# Patient Record
Sex: Female | Born: 1950 | Race: White | Hispanic: No | Smoking: Former smoker
Health system: Southern US, Community
[De-identification: ages and names within clinical notes are randomized; demographics above are authoritative.]

## PROBLEM LIST (undated history)

## (undated) DIAGNOSIS — I10 Essential (primary) hypertension: Secondary | ICD-10-CM

## (undated) DIAGNOSIS — F17200 Nicotine dependence, unspecified, uncomplicated: Secondary | ICD-10-CM

## (undated) DIAGNOSIS — Z86010 Personal history of colonic polyps: Secondary | ICD-10-CM

## (undated) DIAGNOSIS — K922 Gastrointestinal hemorrhage, unspecified: Secondary | ICD-10-CM

## (undated) DIAGNOSIS — K219 Gastro-esophageal reflux disease without esophagitis: Secondary | ICD-10-CM

## (undated) DIAGNOSIS — I252 Old myocardial infarction: Secondary | ICD-10-CM

## (undated) DIAGNOSIS — Z5189 Encounter for other specified aftercare: Secondary | ICD-10-CM

## (undated) DIAGNOSIS — R200 Anesthesia of skin: Secondary | ICD-10-CM

## (undated) DIAGNOSIS — R932 Abnormal findings on diagnostic imaging of liver and biliary tract: Secondary | ICD-10-CM

## (undated) DIAGNOSIS — I509 Heart failure, unspecified: Secondary | ICD-10-CM

## (undated) DIAGNOSIS — D649 Anemia, unspecified: Secondary | ICD-10-CM

## (undated) DIAGNOSIS — I2510 Atherosclerotic heart disease of native coronary artery without angina pectoris: Secondary | ICD-10-CM

## (undated) DIAGNOSIS — I714 Abdominal aortic aneurysm, without rupture, unspecified: Secondary | ICD-10-CM

## (undated) DIAGNOSIS — E785 Hyperlipidemia, unspecified: Secondary | ICD-10-CM

## (undated) DIAGNOSIS — R202 Paresthesia of skin: Secondary | ICD-10-CM

## (undated) DIAGNOSIS — I219 Acute myocardial infarction, unspecified: Secondary | ICD-10-CM

## (undated) DIAGNOSIS — I2589 Other forms of chronic ischemic heart disease: Secondary | ICD-10-CM

## (undated) DIAGNOSIS — M1990 Unspecified osteoarthritis, unspecified site: Secondary | ICD-10-CM

## (undated) DIAGNOSIS — R930 Abnormal findings on diagnostic imaging of skull and head, not elsewhere classified: Secondary | ICD-10-CM

## (undated) DIAGNOSIS — J449 Chronic obstructive pulmonary disease, unspecified: Secondary | ICD-10-CM

## (undated) DIAGNOSIS — C50919 Malignant neoplasm of unspecified site of unspecified female breast: Secondary | ICD-10-CM

## (undated) HISTORY — DX: Atherosclerotic heart disease of native coronary artery without angina pectoris: I25.10

## (undated) HISTORY — DX: Abnormal findings on diagnostic imaging of skull and head, not elsewhere classified: R93.0

## (undated) HISTORY — PX: COLONOSCOPY: SHX174

## (undated) HISTORY — DX: Essential (primary) hypertension: I10

## (undated) HISTORY — DX: Gastrointestinal hemorrhage, unspecified: K92.2

## (undated) HISTORY — PX: OTHER SURGICAL HISTORY: SHX169

## (undated) HISTORY — DX: Abdominal aortic aneurysm, without rupture, unspecified: I71.40

## (undated) HISTORY — DX: Abdominal aortic aneurysm, without rupture: I71.4

## (undated) HISTORY — DX: Paresthesia of skin: R20.2

## (undated) HISTORY — DX: Hyperlipidemia, unspecified: E78.5

## (undated) HISTORY — DX: Acute myocardial infarction, unspecified: I21.9

## (undated) HISTORY — DX: Paresthesia of skin: R20.0

## (undated) HISTORY — DX: Unspecified osteoarthritis, unspecified site: M19.90

## (undated) HISTORY — DX: Anemia, unspecified: D64.9

## (undated) HISTORY — DX: Other forms of chronic ischemic heart disease: I25.89

## (undated) HISTORY — DX: Encounter for other specified aftercare: Z51.89

## (undated) HISTORY — PX: POLYPECTOMY: SHX149

## (undated) HISTORY — DX: Old myocardial infarction: I25.2

## (undated) HISTORY — DX: Abnormal findings on diagnostic imaging of liver and biliary tract: R93.2

## (undated) HISTORY — DX: Personal history of colonic polyps: Z86.010

## (undated) HISTORY — DX: Heart failure, unspecified: I50.9

## (undated) HISTORY — DX: Nicotine dependence, unspecified, uncomplicated: F17.200

## (undated) HISTORY — DX: Gastro-esophageal reflux disease without esophagitis: K21.9

---

## 2006-06-29 ENCOUNTER — Inpatient Hospital Stay (HOSPITAL_COMMUNITY): Admission: EM | Admit: 2006-06-29 | Discharge: 2006-07-02 | Payer: Self-pay | Admitting: Emergency Medicine

## 2006-06-29 ENCOUNTER — Ambulatory Visit: Payer: Self-pay | Admitting: Internal Medicine

## 2006-07-01 ENCOUNTER — Encounter: Payer: Self-pay | Admitting: Cardiology

## 2006-07-09 ENCOUNTER — Ambulatory Visit: Payer: Self-pay | Admitting: Cardiology

## 2006-07-09 LAB — CONVERTED CEMR LAB
BUN: 10 mg/dL (ref 6–23)
Calcium: 9.3 mg/dL (ref 8.4–10.5)
Glucose, Bld: 91 mg/dL (ref 70–99)
Potassium: 4.7 meq/L (ref 3.5–5.1)
Sodium: 134 meq/L — ABNORMAL LOW (ref 135–145)

## 2006-07-14 ENCOUNTER — Ambulatory Visit: Payer: Self-pay | Admitting: Cardiology

## 2006-07-14 LAB — CONVERTED CEMR LAB
BUN: 12 mg/dL (ref 6–23)
CO2: 28 meq/L (ref 19–32)
Calcium: 9.3 mg/dL (ref 8.4–10.5)
GFR calc Af Amer: 112 mL/min
GFR calc non Af Amer: 93 mL/min
Glucose, Bld: 96 mg/dL (ref 70–99)

## 2006-07-28 ENCOUNTER — Ambulatory Visit: Payer: Self-pay

## 2006-07-28 LAB — CONVERTED CEMR LAB
BUN: 9 mg/dL (ref 6–23)
Calcium: 9.1 mg/dL (ref 8.4–10.5)
Chloride: 100 meq/L (ref 96–112)
Creatinine, Ser: 0.7 mg/dL (ref 0.4–1.2)
GFR calc Af Amer: 112 mL/min
GFR calc non Af Amer: 93 mL/min
Sodium: 136 meq/L (ref 135–145)

## 2006-08-11 ENCOUNTER — Encounter (HOSPITAL_COMMUNITY): Admission: RE | Admit: 2006-08-11 | Discharge: 2006-08-11 | Payer: Self-pay | Admitting: Pediatrics

## 2006-08-11 ENCOUNTER — Ambulatory Visit: Payer: Self-pay | Admitting: Cardiology

## 2006-09-05 ENCOUNTER — Ambulatory Visit: Payer: Self-pay | Admitting: Cardiology

## 2006-09-05 LAB — CONVERTED CEMR LAB
ALT: 18 units/L (ref 0–40)
AST: 15 units/L (ref 0–37)
Bilirubin, Direct: 0.1 mg/dL (ref 0.0–0.3)
Total Protein: 6.4 g/dL (ref 6.0–8.3)
Triglycerides: 60 mg/dL (ref 0–149)
VLDL: 12 mg/dL (ref 0–40)

## 2006-09-09 ENCOUNTER — Ambulatory Visit (HOSPITAL_COMMUNITY): Admission: RE | Admit: 2006-09-09 | Discharge: 2006-09-09 | Payer: Self-pay | Admitting: Cardiology

## 2006-09-09 ENCOUNTER — Ambulatory Visit: Payer: Self-pay | Admitting: Cardiology

## 2006-09-09 ENCOUNTER — Encounter: Payer: Self-pay | Admitting: Cardiology

## 2006-09-10 ENCOUNTER — Ambulatory Visit: Payer: Self-pay | Admitting: Cardiology

## 2007-03-25 ENCOUNTER — Ambulatory Visit: Payer: Self-pay | Admitting: Cardiovascular Disease

## 2007-09-18 ENCOUNTER — Ambulatory Visit: Payer: Self-pay | Admitting: Cardiovascular Disease

## 2008-10-06 ENCOUNTER — Ambulatory Visit: Payer: Self-pay | Admitting: Internal Medicine

## 2008-10-07 ENCOUNTER — Observation Stay (HOSPITAL_COMMUNITY): Admission: EM | Admit: 2008-10-07 | Discharge: 2008-10-07 | Payer: Self-pay | Admitting: Emergency Medicine

## 2008-10-10 ENCOUNTER — Telehealth: Payer: Self-pay | Admitting: Cardiovascular Disease

## 2008-10-25 DIAGNOSIS — I2510 Atherosclerotic heart disease of native coronary artery without angina pectoris: Secondary | ICD-10-CM

## 2008-10-25 DIAGNOSIS — F17200 Nicotine dependence, unspecified, uncomplicated: Secondary | ICD-10-CM | POA: Insufficient documentation

## 2008-10-25 DIAGNOSIS — E785 Hyperlipidemia, unspecified: Secondary | ICD-10-CM | POA: Insufficient documentation

## 2008-10-25 DIAGNOSIS — I509 Heart failure, unspecified: Secondary | ICD-10-CM | POA: Insufficient documentation

## 2008-10-25 DIAGNOSIS — I10 Essential (primary) hypertension: Secondary | ICD-10-CM

## 2008-10-25 DIAGNOSIS — I2589 Other forms of chronic ischemic heart disease: Secondary | ICD-10-CM

## 2008-10-26 ENCOUNTER — Ambulatory Visit: Payer: Self-pay | Admitting: Internal Medicine

## 2008-10-26 DIAGNOSIS — I714 Abdominal aortic aneurysm, without rupture, unspecified: Secondary | ICD-10-CM | POA: Insufficient documentation

## 2008-10-28 ENCOUNTER — Ambulatory Visit: Payer: Self-pay | Admitting: Internal Medicine

## 2008-11-01 ENCOUNTER — Ambulatory Visit: Payer: Self-pay | Admitting: Cardiovascular Disease

## 2008-11-01 DIAGNOSIS — R930 Abnormal findings on diagnostic imaging of skull and head, not elsewhere classified: Secondary | ICD-10-CM

## 2008-11-01 LAB — CONVERTED CEMR LAB
BUN: 6 mg/dL (ref 6–23)
CO2: 28 meq/L (ref 19–32)
Calcium: 8.9 mg/dL (ref 8.4–10.5)
Chloride: 109 meq/L (ref 96–112)
GFR calc non Af Amer: 91.75 mL/min (ref >60)
Glucose, Bld: 68 mg/dL — ABNORMAL LOW (ref 70–99)

## 2008-11-04 ENCOUNTER — Encounter: Admission: RE | Admit: 2008-11-04 | Discharge: 2008-11-04 | Payer: Self-pay | Admitting: Cardiovascular Disease

## 2008-11-07 ENCOUNTER — Telehealth: Payer: Self-pay | Admitting: Cardiovascular Disease

## 2008-11-09 ENCOUNTER — Ambulatory Visit: Payer: Self-pay | Admitting: Gastroenterology

## 2008-11-09 DIAGNOSIS — R932 Abnormal findings on diagnostic imaging of liver and biliary tract: Secondary | ICD-10-CM | POA: Insufficient documentation

## 2008-11-09 LAB — CONVERTED CEMR LAB
Basophils Absolute: 0.1 10*3/uL (ref 0.0–0.1)
Calcium: 9 mg/dL (ref 8.4–10.5)
Chloride: 109 meq/L (ref 96–112)
Creatinine, Ser: 0.7 mg/dL (ref 0.4–1.2)
Eosinophils Absolute: 0.3 10*3/uL (ref 0.0–0.7)
Eosinophils Relative: 2.9 % (ref 0.0–5.0)
HCT: 37.7 % (ref 36.0–46.0)
Lymphocytes Relative: 26.7 % (ref 12.0–46.0)
MCHC: 34.4 g/dL (ref 30.0–36.0)
MCV: 92.1 fL (ref 78.0–100.0)
Neutrophils Relative %: 61 % (ref 43.0–77.0)
Potassium: 4.3 meq/L (ref 3.5–5.1)

## 2008-11-11 ENCOUNTER — Telehealth (INDEPENDENT_AMBULATORY_CARE_PROVIDER_SITE_OTHER): Payer: Self-pay | Admitting: *Deleted

## 2008-11-18 ENCOUNTER — Telehealth (INDEPENDENT_AMBULATORY_CARE_PROVIDER_SITE_OTHER): Payer: Self-pay | Admitting: *Deleted

## 2008-11-21 ENCOUNTER — Ambulatory Visit: Payer: Self-pay | Admitting: Cardiovascular Disease

## 2008-11-23 ENCOUNTER — Encounter: Payer: Self-pay | Admitting: Gastroenterology

## 2009-03-22 ENCOUNTER — Encounter (INDEPENDENT_AMBULATORY_CARE_PROVIDER_SITE_OTHER): Payer: Self-pay | Admitting: *Deleted

## 2009-05-19 ENCOUNTER — Ambulatory Visit: Payer: Self-pay | Admitting: Cardiovascular Disease

## 2009-09-14 ENCOUNTER — Encounter (INDEPENDENT_AMBULATORY_CARE_PROVIDER_SITE_OTHER): Payer: Self-pay | Admitting: *Deleted

## 2009-11-03 ENCOUNTER — Telehealth (INDEPENDENT_AMBULATORY_CARE_PROVIDER_SITE_OTHER): Payer: Self-pay | Admitting: *Deleted

## 2010-07-03 NOTE — Progress Notes (Signed)
  Walk in Patient Form Recieved "PT needs Script for Plavix" sent to [facility]  November 03, 2009 3:18 PM

## 2010-07-03 NOTE — Letter (Signed)
Summary: Appointment - Reminder 2  [employer], Main Office  [address]   [place], [place] [address]   Phone: [phone]  Fax: [phone]     September 14, 2009 MRN: [id number]   [facility] [address] [place], [place]  [address]   Dear Ms. Fiore,  Our records indicate that it is time to schedule a follow-up appointment with Dr. Eden Emms. It is very important that we reach you to schedule this appointment. We look forward to participating in your health care needs. Please contact us at the number listed above at your earliest convenience to schedule your appointment.  If you are unable to make an appointment at this time, give [place] a call so we can update our records.     Sincerely,   Migdalia Dk [facility] Scheduling Team

## 2010-09-11 LAB — CBC
HCT: 37.4 % (ref 36.0–46.0)
MCHC: 33.9 g/dL (ref 30.0–36.0)
Platelets: 247 10*3/uL (ref 150–400)
RBC: 4.05 MIL/uL (ref 3.87–5.11)
RDW: 13.9 % (ref 11.5–15.5)
WBC: 9.9 10*3/uL (ref 4.0–10.5)

## 2010-09-11 LAB — CK TOTAL AND CKMB (NOT AT ARMC)
CK, MB: 1 ng/mL (ref 0.3–4.0)
Relative Index: 0.8 (ref 0.0–2.5)
Total CK: 127 U/L (ref 7–177)

## 2010-09-11 LAB — POCT I-STAT, CHEM 8
Calcium, Ion: 1.12 mmol/L (ref 1.12–1.32)
Chloride: 99 mEq/L (ref 96–112)
Potassium: 3.6 mEq/L (ref 3.5–5.1)
Sodium: 135 mEq/L (ref 135–145)

## 2010-09-11 LAB — CARDIAC PANEL(CRET KIN+CKTOT+MB+TROPI)
CK, MB: 1 ng/mL (ref 0.3–4.0)
CK, MB: 1 ng/mL (ref 0.3–4.0)
Relative Index: 0.8 (ref 0.0–2.5)
Relative Index: 0.8 (ref 0.0–2.5)
Total CK: 121 U/L (ref 7–177)
Troponin I: 0.01 ng/mL (ref 0.00–0.06)
Troponin I: 0.02 ng/mL (ref 0.00–0.06)

## 2010-09-11 LAB — POCT CARDIAC MARKERS: Myoglobin, poc: 44.3 ng/mL (ref 12–200)

## 2010-09-11 LAB — GLUCOSE, CAPILLARY: Glucose-Capillary: 102 mg/dL — ABNORMAL HIGH (ref 70–99)

## 2010-09-23 ENCOUNTER — Other Ambulatory Visit: Payer: Self-pay | Admitting: Cardiovascular Disease

## 2010-10-12 ENCOUNTER — Other Ambulatory Visit: Payer: Self-pay | Admitting: *Deleted

## 2010-10-12 MED ORDER — CLOPIDOGREL BISULFATE 75 MG PO TABS: 75.0000 mg | ORAL_TABLET | Freq: Every day | ORAL | Status: DC

## 2010-10-16 NOTE — Cardiovascular Report (Signed)
[name], [name]                ACCOUNT NO.:  [id number]   MEDICAL RECORD NO.:  [id number]          PATIENT TYPE:  INP   LOCATION:  3735                         FACILITY:  [facility]   PHYSICIAN:  Bevelyn Buckles. Bensimhon, MDDATE OF BIRTH:  [birth year]   DATE OF PROCEDURE:  10/07/2008  DATE OF DISCHARGE:  10/07/2008                            CARDIAC CATHETERIZATION   IDENTIFICATION:  [name] is a very pleasant 60 year old woman with a  history of hypertension, hyperlipidemia, ongoing tobacco use, and  coronary artery disease.  She is status post anterior wall myocardial  infarction in 1998 with stenting of proximal LAD in 2008.  She had a  bare-metal stenting to the LAD at the distal edge of the previous stent.  She has been having chest pain both typical and atypical features.  She  is ruled out for myocardial infarction with serial cardiac markers.  She  is brought for diagnostic angiography.   PROCEDURES PERFORMED:  1. Selective coronary angiography.  2. Left heart cath.  3. Left ventriculogram.  4. Abdominal aortogram.  5. Starclose femoral artery closure.   DESCRIPTION OF PROCEDURE:  The risks and indication were discussed and  consent was signed and placed on the chart.  A 5-French arterial sheath  was placed in the right femoral artery using a modified Seldinger  technique.  Standard catheters including a JL-4, JR-4, and angled  pigtail were used.  All catheter exchanges made over wire.  There were  no apparent complications.   Central aortic pressure 109/59 with mean of 78.  LV pressure is 110/2  with an EDP of 4.  There was no aortic stenosis on pullback.   Left main was normal.   LAD was a long vessel wrapping the apex and gave off 2 diagonal  branches.  In the proximal portion, there was an area of previous  stenting.  At the proximal edge of the stent, there was approximately 40-  50% in-stent restenosis.   Left circumflex gave off a small ramus branch and 2 OMs  that was  angiographically normal.   Right coronary artery was a moderate-sized dominant vessel with PDA and  2 posterolaterals.  There was a 30% proximal lesion.   Left ventriculogram done in the RAO position showed an EF of 55% with no  regional wall motion abnormalities.   Abdominal aortogram, left renal artery was widely patent.  The ostium of  right renal artery was not well visualized due to an overlying  mesenteric vessel.  There was a moderate sized infrarenal abdominal  aortic aneurysm with distal aortoiliac disease.   ASSESSMENT:  1. Nonobstructive coronary artery disease with mild to moderate in-      stent restenosis of the proximal left anterior descending coronary      artery stent.  This is non-flow limiting.  2. Normal left ventricular function.  3. Peripheral arterial disease with a moderate size abdominal aortic      aneurysm.   PLAN/DISCUSSION:  Based on her angiography, I do not think her chest  pain is cardiac in nature.  We will continue with medical therapy.  She  can be discharged to home today.  I would suggest CT of the abdomen as  an outpatient to further evaluate and size her abdominal aortic  aneurysm.  I counseled her several times in the need to stop smoking.      Bevelyn Buckles. Bensimhon, MD  Electronically Signed     DRB/MEDQ  D:  10/07/2008  T:  10/08/2008  Job:  [id number]

## 2010-10-16 NOTE — Assessment & Plan Note (Signed)
[facility]                            CARDIOLOGY OFFICE NOTE   [name], [name]                       MRN:          [id number]  DATE:03/25/2007                            DOB:          06-11-1950    [name] is seen today in followup.  I have not seen her in 10 years.  I initially saw her back in 1998 when she had an anterior MI and  required stenting of the LAD.  She subsequently was lost to follow up.  She re-presented to the hospital, I believe last January, with a  recurrent anterior MI requiring additional stenting by Dr. Samule Ohm.  She  followed up with him on one occasion.  She has residual 70% RCA disease  and a nonischemic Myoview.  Unfortunately, she has started smoking again  over the last three months.  She tried Chantix to quit before, which was  somewhat helpful, but she had nightmares and suicidal ideation.   We had a long discussion about this regarding her premature coronary  disease and the risks of smoking.  I counseled her for less than 10  minutes.   I told her we would call in a prescription for generic Wellbutrin to see  if she could stop again.   In regards to her heart, she has not had any significant chest pain,  PND, orthopnea.  There have been no palpitations or lower extremity  edema.  She has good LV function still.   She has been compliant with her other medications.  Her coronary risk  factors include hyperlipidemia, hypertension, and smoking.   CURRENT MEDICATIONS:  1. Aspirin daily.  2. Plavix 75 daily.  3. Lisinopril 10 daily.  4. Lipitor 80 a day.  5. Carvedilol 12.5 b.i.d.  6. Wellbutrin 150, to be started.   PHYSICAL EXAMINATION:  Her exam is remarkable for a pleasant middle-aged  white female in no distress.  She looks a little bit older than her  stated age.  Weight is 132.  Blood pressure is 140/80.  Pulse is 70 and regular.  Afebrile.  Respiratory rate is 14.  HEENT:  Normal.  Carotids are  normal without bruit.  No lymphadenopathy.  No thyromegaly.  No JVP elevation.  LUNGS:  Clear with good diaphragmatic motion.  No wheezing.  There is an S1 and S2 with normal heart sounds.  PMI is normal.  ABDOMEN:  Benign.  Bowel sounds are positive.  No AAA.  No  hepatosplenomegaly or hepatojugular reflux.  Distal pulses are intact  with no edema.  NEURO:  Nonfocal.  No muscular weakness.   Her EKG shows sinus rhythm with previous anterolateral T wave  inversions.   IMPRESSION:  1. Coronary disease:  Two stents to the left anterior descending      artery.  Continue aspirin and Plavix indefinitely.  Follow up      Myoview in March, 2009, which will be a year.  2. Smoking:  Wellbutrin called in to [facility] on [address], 150 mg,      continue  to try to stop in regards  to her coronary disease.  3. Hyperlipidemia:  Continue Lipitor 80 mg a day.  Follow up lipid and      liver profile in six months.  4. Hypertension:  Currently well controlled in the setting of two      previous anterior wall myocardial infarctions.  Continue ACE      inhibitor, lisinopril 10 mg a day, probably recheck left      ventricular function during Myoview in six months.   Overall, [name] is doing well, and it was nice to reacquaint myself with  [name] after 10 years.     Noralyn Pick. Eden Emms, MD, [facility]  Electronically Signed    PCN/MedQ  DD: 03/25/2007  DT: 03/26/2007  Job #: [id number]

## 2010-10-16 NOTE — H&P (Signed)
[name], [name] NO.:  [id number]   MEDICAL RECORD NO.:  [id number]          PATIENT TYPE:  INP   LOCATION:  3735                         FACILITY:  [facility]   PHYSICIAN:  Therisa Doyne, MD    DATE OF BIRTH:  [birth year]   DATE OF ADMISSION:  10/06/2008  DATE OF DISCHARGE:                              HISTORY & PHYSICAL   CHIEF COMPLAINT:  Chest pain.   HISTORY OF PRESENT ILLNESS:  A 60 year old white female with a past  medical history significant for coronary disease status post anterior  myocardial infarction in 1998 as well as in 2008.  She presents for  evaluation of chest pain.  Historically, the patient had an anterior MI  in 1998 and subsequently had a reinfarction in January of 2008.  At that  time, she underwent bare-metal stent placement to the left anterior  descending artery.  She had a residual 70% lesion in her proximal RCA.  She underwent Myoview study in March of 2008 which showed no evidence of  inducible ischemia, and because of this, her lesion in the right  coronary artery was medically managed.  She has done well over the past  couple of years until 2-3 days ago when she developed retrosternal chest  pressure as well as gas pain.  She describes these symptoms as  intermittent, occurring throughout the day for the past 3 days.  They  have not been associated with exertion.  They are not associated with  any other symptoms.  They are relieved with belching.  She denies any  heart failure symptoms.  Denies PND, orthopnea, dyspnea on exertion,  lower extremity edema, or syncope.   In the emergency department, the patient was seen and evaluated and was  given aspirin, nitroglycerin paste, and it was thought she would benefit  from admission for rule out of myocardial infarction.   PAST MEDICAL HISTORY:  1. Coronary artery disease.  She is status post anterior myocardial      infarction in 1998 as well as in 2008.  In January 2008, she  underwent bare-metal stent placement to the left anterior      descending artery.  She had a 70% lesion that was residual in her      proximal right coronary artery.  In March of 2008, her adenosine      Myoview showed no evidence of inducible ischemia.  2. Hypertension.  3. Hyperlipidemia.  4. Ongoing tobacco abuse.   SOCIAL HISTORY:  The patient lives at home with her husband.  She  continues to use tobacco, stating that she smokes 1 pack per week.  She  drinks less than 1 beer per week and denies any drug use.   FAMILY HISTORY:  Negative for early coronary disease.   ALLERGIES:  No known drug allergies.   MEDICATIONS:  1. Aspirin 325 mg daily.  2. Plavix 75 mg daily.  3. Coreg 12.5 mg b.i.d.  4. Lisinopril 10 mg daily.  5. Lipitor 80 mg daily.   REVIEW OF SYSTEMS:  All systems are reviewed and are negative except as  mentioned above in history of present illness.   PHYSICAL EXAMINATION:  VITAL SIGNS:  Temperature afebrile, blood  pressure 150/91, pulse 71, respirations 16, oxygen saturation 100% on  room air.  GENERAL:  No acute distress.  HEENT:  Normocephalic, atraumatic.  Pupils are equal, round, and  reactive to light and accommodation.  Extraocular movements are intact.  Oral mucosa pink and moist with no lesions.  NECK:  Is supple with no lymphadenopathy.  No jugular venous distention.  No masses.  CARDIOVASCULAR:  Regular rate and rhythm.  No murmurs, rubs, or gallops.  CHEST:  Clear to auscultation bilaterally.  ABDOMEN:  Positive bowel sounds.  Soft, nontender, nondistended.  EXTREMITIES:  No clubbing, cyanosis, or edema.  Dorsalis pedis pulses 3  plus bilaterally.  SKIN:  No rashes.  BACK:  No CVA tenderness.   PERTINENT LABORATORY DATA:  1. CBC shows a hemoglobin of 12.7, normal renal function on her      metabolic panel.  Troponin less than 0.05.  CK-MB less than 1.  2. EKG shows normal sinus rhythm at 71 beats per minute with an      incomplete right  bundle branch block which is old.  She has no      acute ST or T wave changes worrisome for ischemia.  Of note, her ST      segment and T-wave changes have actually improved and normalized      since January of 2008.  3. Chest x-ray shows no acute cardiopulmonary disease but did show a      12-mm lung nodule.   ASSESSMENT AND PLAN:  Ms. Tsuchiya is a 60 year old white female with a  past medical history significant for coronary disease status post 2  anterior myocardial infarctions, status post bare-metal stent placement  in the left anterior descending artery and known residual 70% lesion in  her right coronary artery who presents with 3 days of atypical chest  pain.   1. We will admit the patient to the [facility] Cardiology service to Dr.      Fabio Bering care.   1. Chest pain.  I think her symptoms are somewhat atypical given the      fact they have been occurring at rest and have been associated with      belching.  She has not had any limitations in her activities of      daily living.  My suspicion for an acute coronary syndrome is low.      It is encouraging that her EKG is normal as are her cardiac      enzymes.  However, she does have known coronary disease and      residual borderline lesions in her right coronary artery.  Because      of that,  I think she will benefit from admission.  We will monitor      with serial electrocardiograms and rule her out for myocardial      infarction with serial cardiac enzymes.  We will continue her home      medications of aspirin, Plavix, Coreg, Lipitor, and lisinopril.  We      will not use anticoagulation or 2B3A inhibitors unless the patient      rules in for myocardial infarction.  Should she rule out, then I      think she would benefit from a noninvasive evaluation that would      include a stress test.  This can be performed as an inpatient or an  outpatient.   1. Hypertension.  Currently well controlled.  Continue home       medications.   1. Hyperlipidemia.  Check fasting lipid profile.  Continue Lipitor.   1. Tobacco abuse.  I have counseled the patient on tobacco cessation.   1. Fluids, electrolytes, and nutrition.  N.p.o., saline lock IV      fluids.   1. Deep venous thrombosis prophylaxis with subcutaneous heparin.      Therisa Doyne, MD  Electronically Signed     SJT/MEDQ  D:  10/07/2008  T:  10/07/2008  Job:  [id number]

## 2010-10-16 NOTE — Assessment & Plan Note (Signed)
[facility]                            CARDIOLOGY OFFICE NOTE   [name], [name]                       MRN:          [id number]  DATE:09/18/2007                            DOB:          Apr 24, 1951    Ms. Maffeo returns today for followup. She has had an anterior wall MI  back in '98 and subsequently reinfarcted in January 2008. She had been  lost to follow-up for 10 years.  She has residual 70% disease.   The patient does not have insurance and tries to see medical personnel  as little as possible.   When I last saw her, she had been doing well.  We started her on  Wellbutrin for smoking cessation and she has not smoked for about 8  months.  She previously had had nightmares and suicidal ideation on  Chantix.   The patient was inquiring about getting her Plavix from [place]. I told  her this was a good deal, she could probably had a 90-day supply for the  price of a 30-day supply.   A prescription was given by my nurse previously. She has otherwise been  doing well.  She has a bit of fatigue and mild exertional dyspnea but no  chest pain, no palpitations, no PND,  or orthopnea.  Her last Myoview  study was March 2008 with adenosine.  She had marked EKG changes but her  scintigraphic images were normal.   I told the patient that ideally we would do a Myoview every year but  given her insurance situation, I told her we would just examine her in a  year see how she does she does. She does have nitroglycerin which she  keeps refilled and knows how to use it.   REVIEW OF SYSTEMS:  Otherwise negative.   She is on an aspirin a day, Plavix 75 a day, lisinopril 10 a day,  Lipitor 80 a day, carvedilol 12.5 b.i.d. and Wellbutrin 150 a day.   PHYSICAL EXAMINATION:  Remarkable for an elderly, middle-aged, white  female who looks a little older than her stated age.  Weight is 137, blood pressure 140/70, pulse 80 and regular, afebrile.  HEENT:   Unremarkable.  No carotid bruits, no lymphadenopathy or  thyromegaly.  LUNGS:  Clear with good diaphragmatic motion.  No wheezing.  S1 and S2 with normal heart sounds. PMI normal.  ABDOMEN:  Benign.  Bowel sounds positive. No AAA, no tenderness, no  hepatosplenomegaly or hepatojugular reflux.  Distal pulses are intact. No edema.  NEUROLOGIC:  Nonfocal.  SKIN:  Warm and dry.  No muscular weakness.   We will have to do an EKG on the patient. Her baseline EKG is markedly  abnormal with anterior lateral T-wave inversions.   IMPRESSION:  1. Coronary artery disease, previous anterior wall myocardial      infarction.  Continue aspirin, Plavix and beta blocker, LV function      improved since initial event.  2. Hypertension currently well controlled.  Continue lisinopril 10 mg      a day, a low-salt diet.  3. Smoking.  Continue to abstain now at 8 months.  Continue Wellbutrin      indefinitely given her propensity for depression.  4. Hyperlipidemia in the setting of coronary disease. Continue      Lipitor, lipid and liver profile in 6 months.  I will see her back      in a year.     Noralyn Pick. Eden Emms, MD, [facility]  Electronically Signed    PCN/MedQ  DD: 09/18/2007  DT: 09/18/2007  Job #: [id number]

## 2010-10-19 NOTE — H&P (Signed)
[name], [name] NO.:  [id number]   MEDICAL RECORD NO.:  [id number]          PATIENT TYPE:  INP   LOCATION:  2807                         FACILITY:  [facility]   PHYSICIAN:  Doylene Canning. Ladona Ridgel, MD    DATE OF BIRTH:  [birth year]   DATE OF ADMISSION:  06/29/2006  DATE OF DISCHARGE:                              HISTORY & PHYSICAL   ADMITTING DIAGNOSIS:  Acute myocardial infarction.   CHIEF COMPLAINT:  Chest pain.   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  The patient notes that for the  last 2 days she has had off and on chest pain, which would come on  spontaneously and resolve spontaneously.  She presented to the emergency  department, where she had 3 mm of ST-segment depression in the  anterolateral leads and 1 mm ST-elevation in lead V1 and the lead AVR.  She was treated with aspirin, heparin, nitroglycerin with improvement,  but still persistence of her pain.  She is admitted for additional  evaluation and treatment.  The patient denies nausea or vomiting.  She  has had very minimal diaphoresis.  She has very mild shortness of  breath.   ADDITIONAL PAST MEDICAL HISTORY:  Notable for a stent placed in 1988,  but we do not have details of this.  She has no other significant  medical problems by her report.   FAMILY HISTORY:  Negative for premature coronary disease.   SOCIAL HISTORY:  The patient admits to tobacco abuse, ongoing.  She  denies alcohol abuse.   REVIEW OF SYSTEMS:  Notable for no vision or hearing problems.  No  nausea, vomiting, diarrhea, constipation, polyuria, polydipsia, heat or  cold intolerance, arthritic complaints, weight changes, skin problems.  She does have chronic arthritis, particularly in her lower extremities.  She  denies any neurologic problems.   PHYSICAL EXAMINATION:  GENERAL:  She is a pleasant, well-appearing  middle-aged woman in no acute distress.  VITAL SIGNS:  Blood pressure was 148/72.  The respirations were 18.  Temperature is 98.  HEENT EXAM:  Normocephalic, atraumatic.  Pupils equally round.  Oropharynx is moist.  Sclerae anicteric.  NECK:  Revealed no jugular venous distention.  There is no thyromegaly.  The trachea was midline.  The carotids are 2+ and symmetric.  LUNGS:  Clear bilaterally to auscultation.  There were no wheezes, rales  or rhonchi.  There is no increased work of breathing.  CARDIOVASCULAR EXAM:  Regular rate and rhythm with a normal S1 and S2.  There was a prominent S4 gallop present.  I did not appreciate any other  murmurs.  PMI was not enlarged, nor was it laterally displaced.  ABDOMINAL EXAM:  Soft, nontender, nondistended.  There was no  organomegaly.  The bowel sounds are present.  There is no rebound or  guarding.  EXTREMITIES:  Demonstrated no cyanosis,  clubbing or edema.  The pulses  were 2+ and symmetric.  NEUROLOGIC EXAM:  Alert and oriented x3 with cranial nerves intact.  Strength is 5/5 and symmetric.   The EKG demonstrates sinus rhythm with 3 mm of ST-segment depression and  1 mm of ST-segment elevation in lead V1 and AVR.  The ST depression was  in leads V3 through V6.   Initial labs were unremarkable.  Cardiac enzymes were not back yet.   IMPRESSION:  1. Acute palpable anterior myocardial infarction.  2. Ongoing tobacco abuse.  3. Known coronary disease by report.   DISCUSSION:  I have recommended that we proceed with primary  angioplasty.  She will be treated accordingly.      Doylene Canning. Ladona Ridgel, MD  Electronically Signed     GWT/MEDQ  D:  06/29/2006  T:  06/29/2006  Job:  [id number]

## 2010-10-19 NOTE — Discharge Summary (Signed)
[name]                ACCOUNT NO.:  [id number]   MEDICAL RECORD NO.:  [id number]          PATIENT TYPE:  INP   LOCATION:  3735                         FACILITY:  [facility]   PHYSICIAN:  [name], MDDATE OF BIRTH:  May 30, 1951   DATE OF ADMISSION:  10/06/2008  DATE OF DISCHARGE:  10/07/2008                               DISCHARGE SUMMARY   PRIMARY CARDIOLOGIST:  Destiny Buckles. Bensimhon, MD.   PRIMARY CARE PHYSICIAN:  Not listed.   PROCEDURES PERFORMED DURING HOSPITALIZATION:  Cardiac catheterization  dated Oct 07, 2008, at 5:00 p.m. completed per Dr. Arvilla Meres  revealing ejection fraction of 55%, right renal not good visualization,  left renal patent, moderate renal abdominal aortic aneurysm,  nonobstructive coronary artery disease with mid in-stent restenosis of  the proximal left anterior descending stent, normal left ventricular  ejection fraction and moderate-sized abdominal aortic aneurysm.   FINAL DISCHARGE DIAGNOSES:  1. Moderate-sized abdominal aortic aneurysm per cardiac      catheterization this admission.  2. Coronary artery disease.      a.     In-stent restenosis, nonobstructive in the proximal left       anterior descending stent.      b.     Status post an anterior myocardial infarction in 1998 and       2008.      c.     Bare-metal stent placement to the left anterior descending       artery in January 2008 with a 70% lesion that was residual in her       proximal right coronary artery.      d.     In March 2008, adenosine Myoview showed no evidence of       ischemia.  3. Hypertension.  4. Hyperlipidemia.  5. Ongoing tobacco abuse.   HOSPITAL COURSE:  A 60 year old Caucasian female with above-mentioned  past medical history who was being treated medically for her coronary  artery disease who over the past 2 days prior to admission developed  sternal chest pressure and gas pain.  The symptoms were intermittent  occurring throughout the day  for the last 3 days without exertion.  As a  result of this, the patient was seen in the emergency department by Dr.  Aldona Bar, fellow for Dr. Arvilla Meres.  She was given aspirin,  nitroglycerin, and was admitted to rule out myocardial infarction.  The  patient was admitted and cardiac enzymes were cycled and found to be  negative x2 at less than 0.05 and 0.01 respectively.  The patient was  seen and examined by Dr. Eden Emms the following morning and felt that the  patient would be a candidate for cardiac catheterization in the setting  of known coronary artery disease with stent and 70% lesion in the right  coronary artery.  The patient did undergo cardiac catheterization per  Dr. Arvilla Meres on Oct 07, 2008, with results described above.  The  patient's right coronary artery revealed 30% stenosis only along with  nonobstructive CAD with in-stent restenosis of the proximal LAD stent.  The  patient was found to have a moderate infrarenal AAA.  The patient  will be continued to be treated medically.  She was advised on smoking  cessation.  The patient was then released home after recovery time to  continue on current medication regimen prior to admission.   DISCHARGE LABORATORY DATA:  Sodium 135, potassium 3.6, chloride 99, CO2  of 25, BUN 6, creatinine 0.7, glucose 96.  Hemoglobin 12.7, hematocrit  37.4, white blood cells 9.9, platelets 247.  Troponins were found to be  negative x3.  EKG revealing normal sinus rhythm with T-wave inversion  V4, V5 and incomplete right bundle-branch block.   Discharge medications were same prior to admission:  1. Plavix 75 mg p.o. daily.  2. Coreg 12.5 mg b.i.d.  3. Lipitor 80 mg daily.  4. Lisinopril 10 mg daily.  5. Bayer Aspirin 325 mg daily.   ALLERGIES:  No known drug allergies.   FOLLOWUP PLANS AND APPOINTMENT:  1. The patient will follow up with Dr. Arvilla Meres in outpatient      with continued cardiac management.  2. The  patient will follow up with primary care physician for      continued medical management.  3. The patient has been given post-cardiac catheterization      instructions with particular emphasis on the right groin site for      evidence of bleeding, hematoma, and signs of infection.   TIME SPENT WITH THE PATIENT TO INCLUDE PHYSICIAN TIME:  30 minutes.      Bettey Mare. Lyman Bishop, NP      Destiny Buckles. Bensimhon, MD  Electronically Signed    KML/MEDQ  D:  11/22/2008  T:  11/23/2008  Job:  [id number]

## 2010-10-19 NOTE — Cardiovascular Report (Signed)
[name], [name] NO.:  [id number]   MEDICAL RECORD NO.:  [id number]          PATIENT TYPE:  INP   LOCATION:  2907                         FACILITY:  [facility]   PHYSICIAN:  Salvadore Farber, MD  DATE OF BIRTH:  06-30-1950   DATE OF PROCEDURE:  DATE OF DISCHARGE:                            CARDIAC CATHETERIZATION   PROCEDURES:  1. Left heart catheterization.  2. Left ventriculography.  3. Coronary angiography.  4. Aspiration thrombectomy of the left anterior descending.  5. Bare metal stent placement in the proximal left anterior      descending.  6. StarClose closure of the right common femoral arteriotomy site.   INDICATIONS:  [name] is a 60 year old woman who previously underwent  stenting of the proximal LAD in 1998.  She presents this morning with an  acute anterior myocardial infarction.  She has had ongoing pain and ST  elevations anterolaterally and is referred for diagnostic angiography  and probable percutaneous coronary intervention.   PROCEDURE TECHNIQUE:  Informed consent was obtained.  The patient  consented to participate in CHAMPION Study comparing 2 antiplatelet  drugs in coronary intervention for acute coronary syndromes.   Under 1% lidocaine local anesthesia, a 6-French sheath was placed in the  right common femoral artery using the modified Seldinger technique.  Diagnostic angiography and ventriculography were performed using JL-4,  JR-4, and pigtail catheters.  Images demonstrated a thrombotic 99%  stenosis of the LAD, occurring just after the distal margin of the  previously placed LAD stent.  The decision was made to proceed to  percutaneous revascularization.   Anticoagulation had been initiated with bivalirudin.  Aspirin had been  given in the emergency room.  CHAMPION study drugs were administered.  A  6-French CLS 3.5 guide was advanced over wire and engaged in the ostium  of the left main.  The Prowater wire was advanced  to the distal LAD  without difficulty.  We began with aspiration thrombectomy using a fetch  catheter.  This removed a large amount of thrombus and improved flow  from TIMI 2 to TIMI 3.  We then proceeded to stenting using a 3.5 x 20  mm Liberte stent deployed at 16 atmospheres and positioned so as to  overlap the distal margin of the previously placed stent.  I then post-  dilated the entirety of the stent using a 4.0 x 20 mm Quantum balloon at  16 atmospheres.  Final angiography demonstrated no residual stenosis, no  dissection, and TIMI 3 flow to the distal vasculature.   The arteriotomy was then closed using a StarClose device.  Complete  hemostasis was obtained.  She was transferred to the holding room in  stable condition having tolerated the procedure well.   COMPLICATIONS:  None.   FINDINGS:  1. LV:  114/14/22.  EF 44% with anterolateral and apical dyskinesis.  2. No aortic stenosis or mitral regurgitation.  3. Left main:  Angiographically normal.  4. LAD:  There is a previously placed stent in the proximal vessel.      This is widely patent.  However, just distal  to the stent, there      was a 99% stenosis with heavy thrombus burden.  This new stenosis      overlaid the origin of a moderate-sized diagonal.  The new lesion      was treated with an overlapping 3.5 x 20 mm Liberte stent post-      dilated to 4 mm.  There is a 40% eccentric plaque in the mid      vessel, downstream of the stented segment.  5. Circumflex:  Moderate-sized vessel giving rise to 2 marginal's.      There is a 20% stenosis proximally.  6. RCA:  Fairly large dominant vessel.  There is a 70% stenosis of the      proximal vessel.   IMPRESSION/RECOMMENDATION:  Successful revascularization of the culprit  lesion in the proximal left anterior descending.  We will plan on  initial strategy of conservative management of the right coronary artery  disease.  We will plan HD Cardiolite in 6 to 8 weeks.  She  will be  maintained on Plavix for a year and aspirin indefinitely.  Beta blocker,  ACE inhibitor, and high-potency statin were all initiated.  Smoking  cessation is strongly advised.      Salvadore Farber, MD  Electronically Signed     WED/MEDQ  D:  06/29/2006  T:  06/29/2006  Job:  [id number]

## 2010-10-19 NOTE — Assessment & Plan Note (Signed)
[facility]                            CARDIOLOGY OFFICE NOTE   CINDA, HARA                       MRN:          [id number]  DATE:07/14/2006                            DOB:          1950/11/24    CARDIOLOGIST:  Dr. Randa Evens.   PRIMARY CARE PHYSICIAN:  None.   HISTORY OF PRESENT ILLNESS:  Ms. Boesen is a very pleasant 60 year old  female patient with a history of coronary artery disease status post  myocardial infarction 1998, she was stented to the proximal LAD, who  presented to [facility] on January 27 with evidence of acute  anterolateral ST elevation myocardial infarction.  She underwent  emergent cardiac catheterization and percutaneous coronary intervention  by Dr. Samule Ohm.  She was treated with a bare metal stent to the LAD just  distal to the previously placed stent which was patent.  Her EF was 44%  at the time of the procedure.  Her filling pressures were elevated and  she was noted to be in mild heart failure.  She was treated with  IV  diuretics.  At catheterization she was also noted to have a residual 70%  lesion in the proximal RCA.  This is treated medically with plans for  followup Myoview scan in 6 weeks to assess the ischemic burden.  The  patient had an echocardiogram prior to discharge from the hospital.  This revealed an EF of 25% - 30% with an akinesis of the entire  periapical wall, akinesis of the entire septal wall, akinesis of the mid-  to-distal anterior wall, and akinesis of the mid-to-distal inferior  wall.  There was mild mitral regurgitation.  She was placed on ACE  inhibitor, beta blocker, high dose statin therapy, as well as Aldactone  therapy.  She was also placed on Chantix to help her quit smoking.  She  returned to the office today for followup post hospitalization.   The patient notes that she is doing well.  She is somewhat fatigued.  She denies any recurrent chest pain or shortness of  breath.  Denies any  orthopnea or paroxysmal nocturnal dyspnea.  Denies any lower extremity  edema.  She denies any weight gain.  As noted above, she is fatigued and  feels that this is most attributed to all the medications she is on.  She has quit smoking.  She continues on Chantix.   CURRENT MEDICATIONS:  1. Aspirin 325 mg daily.  2. Plavix 75 mg daily.  3. Lisinopril 10 mg daily.  4. Coreg 6.25 mg twice daily.  5. Lipitor 80 mg nightly.  6. Lasix 20 mg daily.  7. K-Dur 20 mEq daily.  8. Chantix 0.5 mg daily.  9. Aldactone 25 mg daily.  10.Nitroglycerin p.r.n. chest pain.   PHYSICAL EXAMINATION:  She is a well-nourished, well-developed female in  no distress.  Blood pressure 120/70, pulse 73, weight 124 pounds.  HEENT:  Unremarkable.  NECK:  With no JVD.  CARDIAC:  S1, S2, regular rate and rhythm.  LUNGS:  Clear to auscultation bilaterally without wheezing, rhonchi, or  rales.  ABDOMEN:  Soft, nontender.  EXTREMITIES:  No edema.  Calves soft, nontender.  RIGHT GROIN:  Without hematoma or bruit.  NEURO:  She is alert and oriented x3, cranial nerves II-XII grossly  intact.  ELECTROCARDIOGRAM:  Reveals sinus rhythm with a heart rate of 62 with  anterolateral T wave inversions which are similar to the tracings  obtained at [facility] on January 27 post procedure.   IMPRESSION:  1. Coronary artery disease.      a.     Status post acute anterolateral ST elevation myocardial       infarction.  Treated with bare metal stent to the left anterior       descending as noted above.      b.     Residual 70% stenosis in the proximal right coronary artery.  2. Ischemic cardiomyopathy with an ejection fraction of 25% - 30%.      a.     Chronic systolic congestive heart failure - controlled.  3. Treated dyslipidemia.  4. Tobacco abuse.      a.     The patient recently quit and remains on Chantix therapy.   PLAN:  The patient presents to the office today for a post   hospitalization followup.  She is stable from a cardiovascular  standpoint.  She will continue on aspirin, Plavix, beta blocker, ACE  inhibitor, statin, and Aldactone therapy.  She did have a recent BMET  drawn that revealed a stable creatinine and potassium.  We will go ahead  and recheck that again today since she is complaining of some fatigue.  We will continue her on all of her current medications the same at this  point in time.  As time goes on, if she has less fatigue we can  certainly try to up titrate her beta blocker and/or ACE inhibitor  therapy.  She will need followup Myoview testing 6 weeks post MI to  evaluate the RCA.  That will be arranged.  She will be arranged to have  followup with Dr. Samule Ohm after that.  She will also be arranged the  patient have a followup echocardiogram in 2 month's time to reassess her  LV function.  If her LV function continues to remain less that 35% she  will need to be referred to electrophysiology for consideration of ICD  implantation.  She will also need followup lipids and LFTs in 6-8 weeks.  I have advised her to remain out of work for now until she sees Dr.  Samule Ohm back.  She can certainly go back to light duty in a couple of  weeks if that is necessary.  I have talked to her about cardiac rehab  today.  She is not sure if she can afford it, as she is self pay.  She  has been encouraged to continue walking program  already on if she cannot attend cardiac rehab.  As noted above she will  follow up with Dr. Samule Ohm in about 6 week's time.      Tereso Newcomer, PA-C  Electronically Signed      Jesse Sans. Daleen Squibb, MD, [facility]  Electronically Signed   SW/MedQ  DD: 07/14/2006  DT: 07/14/2006  Job #: [id number]

## 2010-10-19 NOTE — Assessment & Plan Note (Signed)
HEALTHCARE                            CARDIOLOGY OFFICE NOTE   [name], [name]                       MRN:          [id number]  DATE:09/10/2006                            DOB:          28-Jun-1950    HISTORY OF PRESENT ILLNESS:  [name] is a 60 year old lady who  suffered anterior myocardial infarction in 1998 treated with stenting in  the proximal LAD using a bare-metal stent.  She then presented in late  January 2008 with recurrent anterior myocardial infarction.  I treated  this with additional stenting of the proximal LAD with a new bare-metal  stent just overlapping the distal margin of the previously placed stent.  She also had a 70% stenosis of the proximal right coronary.   She has recovered well after her myocardial infarction.  She is not  having any exertional dyspnea, PND, orthopnea, edema, palpitations,  syncope, or presyncope.  She is eager to return to some yard work.  She  has had some mild epigastric discomfort that she associates with food  intake, and she thinks is gas.  She has had no discomfort with the  moderate levels of exertion that she has done.   CURRENT MEDICATIONS:  1. Aspirin 325 mg daily.  2. Plavix 75 mg daily.  3. Lisinopril 10 mg daily.  4. Coreg 6.25 mg twice daily.  5. Lipitor 80 mg nightly.  6. Lasix 20 mg daily.  7. K-Dur 20 mEq daily.  8. Chantix.  9. Aldactone 25 mg daily.   PHYSICAL EXAMINATION:  She is generally well appearing, in no distress.  Heart rate 72.  Blood pressure 120/70.  Weight of 123 pounds.  She has no jugular venous distention, thyromegaly, or lymphadenopathy.  LUNGS:  Clear to auscultation.  She has a nondisplaced point of maximal cardiac impulse.  There is a  regular rate and rhythm without murmur, rub, or gallop.  ABDOMEN:  Soft, non-distended, non-tender.  There is no  hepatosplenomegaly.  Bowel sounds are normal.  EXTREMITIES:  Warm without clubbing, cyanosis, edema, or  ulceration.  Carotid pulses 2+ bilaterally without bruit.  Femoral pulses 2+  bilaterally without bruit.   STUDIES:  Adenosine Cardiolite performed August 11, 2006 demonstrated no  evidence of ischemia or infarction with EF of 65%.  Echocardiogram  performed yesterday demonstrates EF 50% to 55% without regional wall  motion abnormality, and no significant valvular disease.   IMPRESSION/RECOMMENDATION:  1. Coronary disease status post anterior mycoardial infarction:      Ejection fraction now returned to normal.  No clinical heart      failure.  We will, therefore, discontinue Lasix, potassium, and      Aldactone.  Continue aspirin indefinitely.  With her 2 plaque      rupture events, I would give strong consideration to longterm      Plavix.  At a minimum, it should be continued for a year.  Would      also continue the ACE inhibitor and beta blocker indefinitely.  We      will increase the Coreg to 12.5 mg twice daily.  2. Tobacco abuse:  I congratulated her on remaining off cigarettes.  3. Hypercholesterolemia:  Continue Lipitor.   I will have her follow up with Dr. Marylin Crosby in 6 months.  She saw him  previously.     Salvadore Farber, MD  Electronically Signed    WED/MedQ  DD: 09/10/2006  DT: 09/10/2006  Job #: [id number]

## 2010-10-19 NOTE — Discharge Summary (Signed)
NAMEMINERVIA, OSSO NO.:  [id number]   MEDICAL RECORD NO.:  [id number]          PATIENT TYPE:  INP   LOCATION:  3738                         FACILITY:  [facility]   PHYSICIAN:  Salvadore Farber, MD  DATE OF BIRTH:  07-09-1950   DATE OF ADMISSION:  DATE OF DISCHARGE:  07/02/2006                               DISCHARGE SUMMARY   PRIMARY CARDIOLOGIST:  Dr. Samule Ohm.   There is no primary care physician on record.   PRINCIPAL DIAGNOSIS:  Acute anterolateral ST segment elevation  myocardial infarction/coronary artery disease.   SECONDARY DIAGNOSES:  1. Acute systolic congestive heart failure, ejection fraction of 44%      with anterolateral and apical dyskinesis by left ventriculography.  2. Ischemic cardiomyopathy.  3. Ongoing tobacco abuse.  4. Hyperlipidemia.   ALLERGIES:  NO KNOWN DRUG ALLERGIES.   PROCEDURES:  1. Left heart cardiac catheterization with successful PCI and stenting      of the proximal LAD with a 3.5 x 20 mm Liberte bare metal stent.  2. Two D echocardiogram.   HISTORY OF PRESENT ILLNESS:  A 60 year old Caucasian female with prior  history of MI in 1998 status post stenting of the proximal LAD who was  in her usual state of health until approximately 2 days prior to  admission when she began to experience intermittent chest discomfort  that resolved spontaneously.  She awoke at 5 a.m. on June 29, 2006  with severe chest pain prompting her to present to the [facility] ED  where ECG revealed 3 mm ST segment depression in leads V3-V6 with 1 mm  ST segment elevation in lead V1 and AVR.  She was seen by Dr. Sharrell Ku, and the cath lab was activated for emergent cardiac  catheterization.   HOSPITAL COURSE:  The patient was taken emergently to the cardiac cath  lab where a left heart cardiac catheterization was performed revealing a  widely patent previously placed stent.  However, just distal to that  stent there was a 99% stenosis with  heavy prominence burden overlying a  moderate-sized diagonal.  This was successfully treated with a 3.5 x 20  mm Liberte bare metal stent.  EF was 44% with anterolateral and apical  dyskinesis.  LVEDP was 22 mmHg.  She was also noted to have 70% proximal  stenosis in the RCA, which, for the time being, is being managed  medically.  Postprocedure, she was monitored in the CCU where she had  complaints of dyspnea requiring IV Lasix.  For management of acute MI as  well as acute systolic heart failure, she was placed on Ace inhibitor  therapy, which was titrated and consolidated, as well as Coreg.  She has  subsequently done well and was transferred out to the floor on January  28th.  She has been ambulating without difficulty or recurrent symptoms,  and we will initiate aldactone therapy on discharge.  She has been  counseled on the importance of smoking cessation as well, and has also  been initiated on Chantix therapy.  We also educated her with regards to  the importance of daily weights, a 2 gm sodium diet, and the importance  of reporting both symptoms and change in weight.  An echocardiogram was  performed on January 29th, and those results are currently pending.  We  have arranged for her to have an outpatient BMET in approximately 1  week, and followup with Dr. Samule Ohm, NP, PA on February 11th at 9:15 a.m.  She will likely require an outpatient Myoview in approximately 6 weeks  to further evaluate the potential ischemic burden of the 70% lesion in  the RCA.  She is otherwise being discharged home today in satisfactory  condition.   DISCHARGE LABS:  Hemoglobin 12.1, hematocrit 35.0, WBC 11.3, platelets  230,000, sodium 139, potassium 3.8, chloride 105, CO2 25, BUN 10,  creatinine 0.5, glucose 91, total bilirubin 0.6, alkaline phosphatase  52, AST 94, ALT 22, total protein 5.2, albumin 3.2, calcium 9.0,  magnesium 2.2, CK 764, MG 29.5, troponin I is 13.28, total cholesterol  194,  triglycerides 71, HDL 51, LDL 129.   DISPOSITION:  Patient is being discharged home today in good condition.   FOLLOW-UP APPOINTMENTS:  She has follow-up BMET scheduled for February  6th at 10 a.m. in our office.  She has a followup with Dr. Samule Ohm, NP,  PA on February 11th at 9:15 a.m.   DISCHARGE MEDICATIONS:  1. Aspirin 325 mg q.d.  2. Plavix 75 mg q.d.  3. Lisinopril 10 mg q.d.  4. Lisinopril 10 mg q.d.  5. Coreg 6.25 mg b.i.d.  6. Lipitor 80 mg q.h.s.  7. Lasix 20 mg q.d.  8. K-Dur 20 mEq q.d.  9. Chantix 0.5 mg q.d.  10.Nitroglycerin 0.4 mg sublingual p.r.n. chest pain.  11.Aldactone 25 mg q.d.   OUTSTANDING LAB STUDIES:  None.   DURATION OF DISCHARGE/ENCOUNTER:  Forty minutes including physician  time.      Nicolasa Ducking, ANP      Salvadore Farber, MD  Electronically Signed    CB/MEDQ  D:  07/02/2006  T:  07/02/2006  Job:  [id number]   cc:   Salvadore Farber, MD

## 2010-11-22 ENCOUNTER — Other Ambulatory Visit: Payer: Self-pay | Admitting: Cardiovascular Disease

## 2010-12-24 ENCOUNTER — Other Ambulatory Visit: Payer: Self-pay | Admitting: Cardiovascular Disease

## 2010-12-28 ENCOUNTER — Telehealth: Payer: Self-pay | Admitting: Cardiovascular Disease

## 2010-12-28 MED ORDER — CARVEDILOL 12.5 MG PO TABS: 12.5000 mg | ORAL_TABLET | Freq: Two times a day (BID) | ORAL | Status: DC

## 2010-12-28 NOTE — Telephone Encounter (Signed)
PT NEEDS A REFILL ON CARVEDILOL 12.5MG  BID CALLED INTO [facility] ON [address] AND SHE IS OUT

## 2010-12-28 NOTE — Telephone Encounter (Signed)
Will call in rx

## 2011-04-09 ENCOUNTER — Other Ambulatory Visit: Payer: Self-pay | Admitting: Cardiovascular Disease

## 2011-04-24 ENCOUNTER — Other Ambulatory Visit: Payer: Self-pay | Admitting: Cardiovascular Disease

## 2011-05-10 ENCOUNTER — Other Ambulatory Visit: Payer: Self-pay | Admitting: *Deleted

## 2011-05-10 MED ORDER — CLOPIDOGREL BISULFATE 75 MG PO TABS: 75.0000 mg | ORAL_TABLET | Freq: Every day | ORAL | Status: AC

## 2011-05-10 MED ORDER — LISINOPRIL 10 MG PO TABS: 10.0000 mg | ORAL_TABLET | Freq: Every day | ORAL | Status: DC

## 2011-05-10 NOTE — Telephone Encounter (Signed)
Gave pt refills and sat  Them up at the front for pick up

## 2011-08-21 ENCOUNTER — Other Ambulatory Visit: Payer: Self-pay | Admitting: Cardiovascular Disease

## 2012-01-22 ENCOUNTER — Other Ambulatory Visit: Payer: Self-pay | Admitting: Cardiovascular Disease

## 2012-04-21 ENCOUNTER — Other Ambulatory Visit: Payer: Self-pay | Admitting: *Deleted

## 2012-04-21 MED ORDER — SIMVASTATIN 40 MG PO TABS: ORAL_TABLET | ORAL | Status: DC

## 2012-06-25 ENCOUNTER — Telehealth: Payer: Self-pay | Admitting: Cardiovascular Disease

## 2012-06-25 ENCOUNTER — Other Ambulatory Visit: Payer: Self-pay | Admitting: *Deleted

## 2012-06-25 NOTE — Telephone Encounter (Signed)
Error - wrong pt

## 2012-06-25 NOTE — Telephone Encounter (Signed)
Walk In pt Form "RX For Plavix" Pt Will P/U Sent to Message Nurse 06/25/12/KM

## 2012-06-26 ENCOUNTER — Telehealth: Payer: Self-pay | Admitting: Cardiovascular Disease

## 2012-06-26 MED ORDER — CLOPIDOGREL BISULFATE 75 MG PO TABS: 75.0000 mg | ORAL_TABLET | Freq: Every day | ORAL | Status: DC

## 2012-06-26 NOTE — Telephone Encounter (Signed)
PLAVIX SCRIPT  SENT VIA EPIC TO [facility] PT IS LONG  OVERDUE FOR AN  APPT  MADE  WITH DR Eden Emms FOR 07-10-12 AT 4:15./CY

## 2012-06-26 NOTE — Telephone Encounter (Signed)
Patient's spouse would like for you to call him when [facility] Rx is ready to be picked up.

## 2012-07-08 ENCOUNTER — Encounter: Payer: Self-pay | Admitting: Cardiovascular Disease

## 2012-07-08 ENCOUNTER — Encounter: Payer: Self-pay | Admitting: *Deleted

## 2012-07-09 ENCOUNTER — Encounter: Payer: Self-pay | Admitting: *Deleted

## 2012-07-10 ENCOUNTER — Ambulatory Visit (INDEPENDENT_AMBULATORY_CARE_PROVIDER_SITE_OTHER): Payer: Self-pay | Admitting: Cardiovascular Disease

## 2012-07-10 ENCOUNTER — Encounter: Payer: Self-pay | Admitting: Cardiovascular Disease

## 2012-07-10 VITALS — BP 130/80 | HR 70 | Ht 63.0 in | Wt 133.0 lb

## 2012-07-10 DIAGNOSIS — I10 Essential (primary) hypertension: Secondary | ICD-10-CM

## 2012-07-10 DIAGNOSIS — I2510 Atherosclerotic heart disease of native coronary artery without angina pectoris: Secondary | ICD-10-CM

## 2012-07-10 MED ORDER — LISINOPRIL 10 MG PO TABS: 10.0000 mg | ORAL_TABLET | Freq: Every day | ORAL | Status: DC

## 2012-07-10 MED ORDER — CARVEDILOL 12.5 MG PO TABS: 12.5000 mg | ORAL_TABLET | Freq: Two times a day (BID) | ORAL | Status: DC

## 2012-07-10 MED ORDER — CLOPIDOGREL BISULFATE 75 MG PO TABS: 75.0000 mg | ORAL_TABLET | Freq: Every day | ORAL | Status: DC

## 2012-07-10 MED ORDER — SIMVASTATIN 40 MG PO TABS: ORAL_TABLET | ORAL | Status: DC

## 2012-07-10 NOTE — Assessment & Plan Note (Signed)
Lab work next week on generic statin now

## 2012-07-10 NOTE — Assessment & Plan Note (Signed)
Euvolemic stable continue current meds

## 2012-07-10 NOTE — Assessment & Plan Note (Signed)
Stable with no angina and good activity level.  Continue medical Rx  

## 2012-07-10 NOTE — Progress Notes (Addendum)
Patient ID: [name], female   DOB: 15-Jul-1950, 62 y.o.   MRN: [id number] Jamarria is seen today in followup for coronary artery disease. She has a distant history of anterior wall MI 1998. She had a bare-metal stent. She had reintervention in 2008. 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 She was recently hospitalized in May of this year for chest pain. She had no limiting lesions or coronaries. Dr. Arcola Jansky she had an abdominal aortic aneurysm. She has a followup CT scan which showed ectasia of the aorta but no significant aneurysm. However there were abnormalities in her liver that need further workup. I reviewed notes from Dr. Rob Bunting. He is referring her to [facility] for possible ERCP. She has dilated distal bile duct. Unfortunately she continues to smoke. She cannot tolerate Chantix in the past. Will call Wellbutrin for her again. She understands the increased risk of recurrent heart attack or smoking. She was also inquiring about having a less expensive drug and Lipitor for her cholesterol. Since her LDL has been under 16 the past I think is reasonable to switch her to [place] 40 of Zocor. She has not had any significant jaundice biliary colic nausea vomiting or diarrhea. She has not had any recurrent chest pain PND orthopnea palpitations or syncope.  Quit smoking 8 months ago  Husband had DCM and sees Bensimohn  ROS: Denies fever, malais, weight loss, blurry vision, decreased visual acuity, cough, sputum, SOB, hemoptysis, pleuritic pain, palpitaitons, heartburn, abdominal pain, melena, lower extremity edema, claudication, or rash.  All other systems reviewed and negative  General: Affect appropriate Healthy:  appears stated age HEENT: normal Neck supple with no adenopathy JVP normal no bruits no thyromegaly Lungs clear with no wheezing and good diaphragmatic motion Heart:  S1/S2 no murmur, no rub, gallop or click PMI normal Abdomen: benighn, BS positve, no  tenderness, no AAA no bruit.  No HSM or HJR Distal pulses intact with no bruits No edema Neuro non-focal Skin warm and dry No muscular weakness   Current Outpatient Prescriptions  Medication Sig Dispense Refill  . aspirin 325 MG tablet Take 325 mg by mouth daily.      . carvedilol (COREG) 12.5 MG tablet TAKE ONE TABLET BY MOUTH TWICE DAILY **PT  NEEDS  TO  MAKE  APPOINTMENT**  60 tablet  12  . clopidogrel (PLAVIX) 75 MG tablet Take 1 tablet (75 mg total) by mouth daily.  30 tablet  1  . lisinopril (PRINIVIL,ZESTRIL) 10 MG tablet TAKE ONE TABLET BY MOUTH EVERY DAY  30 tablet  12  . simvastatin (ZOCOR) 40 MG tablet TAKE ONE TABLET BY MOUTH EVERY DAY AT BEDTIME  30 tablet  12    Allergies  Review of patient's allergies indicates no known allergies.  Electrocardiogram:  SR ICRBBB no change from 2013  Assessment and Plan

## 2012-07-10 NOTE — Addendum Note (Signed)
Addended by: Scherrie Bateman E on: 07/10/2012 01:52 PM   Modules accepted: Orders

## 2012-07-10 NOTE — Patient Instructions (Signed)
Your physician wants you to follow-up in: YEAR WITH  DR Haywood Filler will receive a reminder letter in the mail two months in advance. If you don't receive a letter, please call our office to schedule the follow-up appointment. Your physician recommends that you continue on your current medications as directed. Please refer to the Current Medication list given to you today Your physician recommends that you return for a FASTING LIPID LIVER  NEXT WEEK  DX 272.4 V58.69

## 2012-07-10 NOTE — Assessment & Plan Note (Signed)
Well controlled.  Continue current medications and low sodium Dash type diet.    

## 2012-07-14 ENCOUNTER — Ambulatory Visit (INDEPENDENT_AMBULATORY_CARE_PROVIDER_SITE_OTHER): Payer: Self-pay | Admitting: *Deleted

## 2012-07-14 DIAGNOSIS — I10 Essential (primary) hypertension: Secondary | ICD-10-CM

## 2012-07-14 LAB — HEPATIC FUNCTION PANEL
ALT: 12 U/L (ref 0–35)
Alkaline Phosphatase: 49 U/L (ref 39–117)
Bilirubin, Direct: 0 mg/dL (ref 0.0–0.3)
Total Bilirubin: 0.4 mg/dL (ref 0.3–1.2)
Total Protein: 6.9 g/dL (ref 6.0–8.3)

## 2012-07-14 LAB — LIPID PANEL: Cholesterol: 136 mg/dL (ref 0–200)

## 2012-07-15 ENCOUNTER — Other Ambulatory Visit: Payer: Self-pay

## 2012-07-15 ENCOUNTER — Telehealth: Payer: Self-pay | Admitting: Cardiovascular Disease

## 2012-07-15 NOTE — Telephone Encounter (Signed)
New Problem:    Patient's husband called in wanting to know the results of his wife's latest labs.  Please call back.

## 2012-07-15 NOTE — Telephone Encounter (Signed)
PT'S  HUSBAND  AWARE OF LAB RESULTS ./CY 

## 2013-07-28 ENCOUNTER — Ambulatory Visit (INDEPENDENT_AMBULATORY_CARE_PROVIDER_SITE_OTHER): Payer: Self-pay | Admitting: Cardiovascular Disease

## 2013-07-28 ENCOUNTER — Encounter: Payer: Self-pay | Admitting: Cardiovascular Disease

## 2013-07-28 ENCOUNTER — Ambulatory Visit: Payer: Self-pay | Admitting: Cardiovascular Disease

## 2013-07-28 VITALS — BP 154/70 | HR 100 | Ht 63.0 in | Wt 140.0 lb

## 2013-07-28 DIAGNOSIS — I2589 Other forms of chronic ischemic heart disease: Secondary | ICD-10-CM

## 2013-07-28 DIAGNOSIS — Z79899 Other long term (current) drug therapy: Secondary | ICD-10-CM

## 2013-07-28 DIAGNOSIS — I10 Essential (primary) hypertension: Secondary | ICD-10-CM

## 2013-07-28 DIAGNOSIS — R42 Dizziness and giddiness: Secondary | ICD-10-CM

## 2013-07-28 DIAGNOSIS — E785 Hyperlipidemia, unspecified: Secondary | ICD-10-CM

## 2013-07-28 DIAGNOSIS — I2510 Atherosclerotic heart disease of native coronary artery without angina pectoris: Secondary | ICD-10-CM

## 2013-07-28 LAB — CBC WITH DIFFERENTIAL/PLATELET
BASOS PCT: 0.4 % (ref 0.0–3.0)
Basophils Absolute: 0 10*3/uL (ref 0.0–0.1)
EOS ABS: 0.1 10*3/uL (ref 0.0–0.7)
Eosinophils Relative: 2.1 % (ref 0.0–5.0)
HCT: 31.7 % — ABNORMAL LOW (ref 36.0–46.0)
Hemoglobin: 9.8 g/dL — ABNORMAL LOW (ref 12.0–15.0)
LYMPHS PCT: 28.9 % (ref 12.0–46.0)
Lymphs Abs: 1.7 10*3/uL (ref 0.7–4.0)
MCHC: 30.9 g/dL (ref 30.0–36.0)
MCV: 80.9 fl (ref 78.0–100.0)
Monocytes Absolute: 0.5 10*3/uL (ref 0.1–1.0)
Monocytes Relative: 9.1 % (ref 3.0–12.0)
NEUTROS PCT: 59.5 % (ref 43.0–77.0)
Neutro Abs: 3.6 10*3/uL (ref 1.4–7.7)
Platelets: 348 10*3/uL (ref 150.0–400.0)
RBC: 3.92 Mil/uL (ref 3.87–5.11)
RDW: 16.5 % — ABNORMAL HIGH (ref 11.5–14.6)
WBC: 6 10*3/uL (ref 4.5–10.5)

## 2013-07-28 LAB — LIPID PANEL
CHOLESTEROL: 161 mg/dL (ref 0–200)
HDL: 65.3 mg/dL (ref >39.00)
LDL Cholesterol: 80 mg/dL (ref 0–99)
Total CHOL/HDL Ratio: 2
Triglycerides: 78 mg/dL (ref 0.0–149.0)
VLDL: 15.6 mg/dL (ref 0.0–40.0)

## 2013-07-28 LAB — BASIC METABOLIC PANEL
BUN: 14 mg/dL (ref 6–23)
CO2: 26 mEq/L (ref 19–32)
Calcium: 9.4 mg/dL (ref 8.4–10.5)
Chloride: 105 mEq/L (ref 96–112)
Creatinine, Ser: 0.7 mg/dL (ref 0.4–1.2)
GFR: 91.78 mL/min (ref >60.00)
Glucose, Bld: 92 mg/dL (ref 70–99)
POTASSIUM: 4 meq/L (ref 3.5–5.1)
Sodium: 138 mEq/L (ref 135–145)

## 2013-07-28 LAB — HEPATIC FUNCTION PANEL
ALT: 16 U/L (ref 0–35)
AST: 18 U/L (ref 0–37)
Albumin: 4.3 g/dL (ref 3.5–5.2)
Alkaline Phosphatase: 58 U/L (ref 39–117)
Bilirubin, Direct: 0 mg/dL (ref 0.0–0.3)
TOTAL PROTEIN: 7.7 g/dL (ref 6.0–8.3)
Total Bilirubin: 0.4 mg/dL (ref 0.3–1.2)

## 2013-07-28 NOTE — Assessment & Plan Note (Signed)
Last EF 50-55%  F/u echo to check

## 2013-07-28 NOTE — Addendum Note (Signed)
Addended by: Phineas Inches D on: 07/28/2013 12:17 PM   Modules accepted: Orders

## 2013-07-28 NOTE — Patient Instructions (Addendum)
Your physician recommends that you schedule a follow-up appointment in: NEXT  AVAILABLE   WITH DR [facility] Your physician has recommended you make the following change in your medication: STOP LISINOPRIL RESTART CARVEDILOL  AT  6.25 MG  TWICE  A DAY   Your physician recommends that you return for lab work in: [place] physician has requested that you have an echocardiogram. Echocardiography is a painless test that uses sound waves to create images of your heart. It provides your doctor with information about the size and shape of your heart and how well your heart's chambers and valves are working. This procedure takes approximately one hour. There are no restrictions for this procedure.

## 2013-07-28 NOTE — Assessment & Plan Note (Signed)
Well controlled.  Continue current medications and low sodium Dash type diet.    

## 2013-07-28 NOTE — Assessment & Plan Note (Signed)
Restart coreg given relative tachycardia  Does not like to be on both ACE and beta blocker Hopefully echo will show good EF

## 2013-07-28 NOTE — Progress Notes (Signed)
Patient ID: [name], female   DOB: Feb 16, 1951, 63 y.o.   MRN: [id number] [name] is seen today in followup for coronary artery disease. She has a distant history of anterior wall MI 1998. She had a bare-metal stent. She had reintervention in 2008. 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 She was recently hospitalized in May of 2013 for chest pain. She had no limiting lesions or coronaries. She has a followup CT scan which showed ectasia of the aorta but no significant aneurysm. However there were abnormalities in her liver that need further workup. I reviewed notes from Dr. Owens Loffler. He is referring her to [facility] for possible ERCP. She has dilated distal bile duct.  She was also inquiring about having a less expensive drug and Lipitor for her cholesterol. Since her LDL has been under 100  the past I think is reasonable to switch her to [place] 40 of Zocor. She has not had any significant jaundice biliary colic nausea vomiting or diarrhea. She has not had any recurrent chest pain PND orthopnea palpitations or syncope.   Quit smoking last year Husband had DCM and sees Bensimohn  Stopped her coreg because she felt weak       ROS: Denies fever, malais, weight loss, blurry vision, decreased visual acuity, cough, sputum, SOB, hemoptysis, pleuritic pain, palpitaitons, heartburn, abdominal pain, melena, lower extremity edema, claudication, or rash.  All other systems reviewed and negative  General: Affect appropriate Healthy:  appears stated age 63: normal Neck supple with no adenopathy JVP normal no bruits no thyromegaly Lungs clear with no wheezing and good diaphragmatic motion Heart:  S1/S2 no murmur, no rub, gallop or click PMI normal Abdomen: benighn, BS positve, no tenderness, no AAA no bruit.  No HSM or HJR Distal pulses intact with no bruits No edema Neuro non-focal Skin warm and dry No muscular weakness   Current Outpatient Prescriptions  Medication Sig  Dispense Refill  . aspirin 325 MG tablet Take 325 mg by mouth daily.      . clopidogrel (PLAVIX) 75 MG tablet Take 1 tablet (75 mg total) by mouth daily.  30 tablet  11  . lisinopril (PRINIVIL,ZESTRIL) 10 MG tablet Take 1 tablet (10 mg total) by mouth daily.  30 tablet  12  . simvastatin (ZOCOR) 40 MG tablet TAKE ONE TABLET BY MOUTH EVERY DAY AT BEDTIME  30 tablet  12   No current facility-administered medications for this visit.    Allergies  Review of patient's allergies indicates no known allergies.  Electrocardiogram:  SR rate 70 ICRBBB   Today SR rate 89 ICRBBB   Assessment and Plan

## 2013-08-01 ENCOUNTER — Other Ambulatory Visit: Payer: Self-pay | Admitting: Cardiovascular Disease

## 2013-08-09 ENCOUNTER — Other Ambulatory Visit: Payer: Self-pay

## 2013-08-09 ENCOUNTER — Other Ambulatory Visit: Payer: Self-pay | Admitting: *Deleted

## 2013-08-09 DIAGNOSIS — D649 Anemia, unspecified: Secondary | ICD-10-CM

## 2013-08-09 MED ORDER — CLOPIDOGREL BISULFATE 75 MG PO TABS: 75.0000 mg | ORAL_TABLET | Freq: Every day | ORAL | Status: DC

## 2013-08-09 MED ORDER — SIMVASTATIN 40 MG PO TABS: ORAL_TABLET | ORAL | Status: DC

## 2013-08-10 ENCOUNTER — Other Ambulatory Visit (INDEPENDENT_AMBULATORY_CARE_PROVIDER_SITE_OTHER): Payer: BC Managed Care – PPO

## 2013-08-10 DIAGNOSIS — D649 Anemia, unspecified: Secondary | ICD-10-CM

## 2013-08-10 LAB — IRON: Iron: 22 ug/dL — ABNORMAL LOW (ref 42–145)

## 2013-08-10 LAB — FERRITIN: Ferritin: 3.3 ng/mL — ABNORMAL LOW (ref 10.0–291.0)

## 2013-08-16 ENCOUNTER — Encounter: Payer: Self-pay | Admitting: Internal Medicine

## 2013-08-16 ENCOUNTER — Ambulatory Visit (HOSPITAL_COMMUNITY): Payer: BC Managed Care – PPO | Attending: Cardiovascular Disease | Admitting: Cardiology

## 2013-08-16 DIAGNOSIS — I2510 Atherosclerotic heart disease of native coronary artery without angina pectoris: Secondary | ICD-10-CM

## 2013-08-16 DIAGNOSIS — R42 Dizziness and giddiness: Secondary | ICD-10-CM | POA: Insufficient documentation

## 2013-08-16 DIAGNOSIS — I509 Heart failure, unspecified: Secondary | ICD-10-CM | POA: Insufficient documentation

## 2013-08-16 DIAGNOSIS — I2589 Other forms of chronic ischemic heart disease: Secondary | ICD-10-CM

## 2013-08-16 NOTE — Progress Notes (Signed)
Echo performed. 

## 2013-08-24 ENCOUNTER — Encounter: Payer: Self-pay | Admitting: Cardiovascular Disease

## 2013-08-24 ENCOUNTER — Ambulatory Visit (INDEPENDENT_AMBULATORY_CARE_PROVIDER_SITE_OTHER): Payer: BC Managed Care – PPO | Admitting: Cardiovascular Disease

## 2013-08-24 VITALS — BP 160/86 | HR 77 | Ht 63.0 in | Wt 145.1 lb

## 2013-08-24 DIAGNOSIS — I10 Essential (primary) hypertension: Secondary | ICD-10-CM

## 2013-08-24 MED ORDER — SIMVASTATIN 40 MG PO TABS: ORAL_TABLET | ORAL | Status: DC

## 2013-08-24 MED ORDER — LOSARTAN POTASSIUM-HCTZ 100-25 MG PO TABS: ORAL_TABLET | ORAL | Status: DC

## 2013-08-24 NOTE — Progress Notes (Signed)
Patient ID: [name], female   DOB: 02/03/1951, 63 y.o.   MRN: [id number] Yariana is seen today in followup for coronary artery disease. She has a distant history of anterior wall MI 1998. She had a bare-metal stent. She had reintervention in 2008. 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 She was recently hospitalized in May of 2013 for chest pain. She had no limiting lesions or coronaries. She has a followup CT scan which showed ectasia of the aorta but no significant aneurysm. However there were abnormalities in her liver that need further workup. I reviewed notes from Dr. Owens Loffler. He is referring her to [facility] for possible ERCP. She has dilated distal bile duct. She was also inquiring about having a less expensive drug and Lipitor for her cholesterol. Since her LDL has been under 100 the past I think is reasonable to switch her to [place] 40 of Zocor. She has not had any significant jaundice biliary colic nausea vomiting or diarrhea. She has not had any recurrent chest pain PND orthopnea palpitations or syncope.  Quit smoking last year Husband had DCM and sees Bensimohn Stopped her coreg because she felt weak  Restarted at lower dose and tolerating BP still too high    Echo 08/16/13  Normal EF   Study Conclusions  - Left ventricle: The cavity size was normal. Wall thickness was normal. Systolic function was normal. The estimated ejection fraction was in the range of 55% to 60%. Wall motion was normal; there were no regional wall motion abnormalities. Doppler parameters are consistent with abnormal left ventricular relaxation (grade 1 diastolic dysfunction). The E/e' ratio is <10, suggesting normal LV filling pressure. - Mitral valve: Calcified annulus. Mild regurgitation. - Left atrium: The atrium was at the upper limits of normal in size. - Tricuspid valve: Mild regurgitation. - Pulmonary arteries: PA peak pressure: 27mm Hg (S). - Inferior vena cava: The  vessel was normal in size; the respirophasic diameter changes were in the normal range (= 50%); findings are consistent with normal central venous pressure.     ROS: Denies fever, malais, weight loss, blurry vision, decreased visual acuity, cough, sputum, SOB, hemoptysis, pleuritic pain, palpitaitons, heartburn, abdominal pain, melena, lower extremity edema, claudication, or rash.  All other systems reviewed and negative  General: Affect appropriate Chronically ill white female HEENT: normal Neck supple with no adenopathy JVP normal no bruits no thyromegaly Lungs clear with no wheezing and good diaphragmatic motion Heart:  S1/S2 no murmur, no rub, gallop or click PMI normal Abdomen: benighn, BS positve, no tenderness, no AAA no bruit.  No HSM or HJR Distal pulses intact with no bruits No edema Neuro non-focal Skin warm and dry No muscular weakness   Current Outpatient Prescriptions  Medication Sig Dispense Refill  . aspirin 325 MG tablet Take 325 mg by mouth daily.      . carvedilol (COREG) 6.25 MG tablet Take 6.25 mg by mouth 2 (two) times daily with a meal.      . clopidogrel (PLAVIX) 75 MG tablet Take 1 tablet (75 mg total) by mouth daily.  30 tablet  6  . simvastatin (ZOCOR) 40 MG tablet TAKE ONE TABLET BY MOUTH EVERY DAY AT BEDTIME  30 tablet  6   No current facility-administered medications for this visit.    Allergies  Review of patient's allergies indicates no known allergies.  Electrocardiogram:  Assessment and Plan

## 2013-08-24 NOTE — Patient Instructions (Signed)
Your physician recommends that you schedule a follow-up appointment in:  Next available with Dr. Lelan Pons 8-10 weeks.    Your physician has recommended you make the following change in your medication: Start Hyzaar 100/25 mg by mouth daily at noon.

## 2013-08-24 NOTE — Assessment & Plan Note (Signed)
Stable with no angina and good activity level.  Continue medical Rx  

## 2013-08-24 NOTE — Assessment & Plan Note (Signed)
Add Hyzaar Lower sodium in diet  Continue beta blocker

## 2013-08-24 NOTE — Assessment & Plan Note (Signed)
Cholesterol is at goal.  Continue current dose of statin and diet Rx.  No myalgias or side effects.  F/U  LFT's in 6 months. Lab Results  Component Value Date   LDLCALC 80 07/28/2013             

## 2013-08-24 NOTE — Assessment & Plan Note (Signed)
EF improved to normal range  Add ACE and better BP control

## 2013-09-27 ENCOUNTER — Telehealth: Payer: Self-pay | Admitting: Gastroenterology

## 2013-09-28 ENCOUNTER — Encounter: Payer: Self-pay | Admitting: Internal Medicine

## 2013-09-28 NOTE — Telephone Encounter (Signed)
That is ok with me if it is OK with Dr. Henrene Pastor.  I saw her previously in office for focally dilated intrahepatic bile duct, referred her to Dr. Jennette Dubin.

## 2013-09-28 NOTE — Telephone Encounter (Signed)
Okay with me 

## 2013-09-28 NOTE — Telephone Encounter (Signed)
[name] this patient is switching to Dr Henrene Pastor

## 2013-09-28 NOTE — Telephone Encounter (Signed)
Do you agree?

## 2013-09-28 NOTE — Telephone Encounter (Signed)
Dr Henrene Pastor do you agree?

## 2013-09-28 NOTE — Telephone Encounter (Signed)
Left message for pt to call back.  Pt has appt scheduled with Dr. Henrene Pastor.

## 2013-10-20 ENCOUNTER — Ambulatory Visit (INDEPENDENT_AMBULATORY_CARE_PROVIDER_SITE_OTHER): Payer: BC Managed Care – PPO | Admitting: Cardiovascular Disease

## 2013-10-20 ENCOUNTER — Encounter: Payer: Self-pay | Admitting: Cardiovascular Disease

## 2013-10-20 ENCOUNTER — Encounter (INDEPENDENT_AMBULATORY_CARE_PROVIDER_SITE_OTHER): Payer: Self-pay

## 2013-10-20 VITALS — BP 152/80 | HR 81 | Ht 63.0 in | Wt 145.0 lb

## 2013-10-20 DIAGNOSIS — I10 Essential (primary) hypertension: Secondary | ICD-10-CM

## 2013-10-20 DIAGNOSIS — I2510 Atherosclerotic heart disease of native coronary artery without angina pectoris: Secondary | ICD-10-CM

## 2013-10-20 DIAGNOSIS — E785 Hyperlipidemia, unspecified: Secondary | ICD-10-CM

## 2013-10-20 DIAGNOSIS — I2589 Other forms of chronic ischemic heart disease: Secondary | ICD-10-CM

## 2013-10-20 NOTE — Assessment & Plan Note (Signed)
She will cut Hyzaar in half and follow  Hold off on blood work until on stable dose

## 2013-10-20 NOTE — Progress Notes (Signed)
Patient ID: [name], female   DOB: [birth year], 63 y.o.   MRN: [id number] [name] is seen today in followup for coronary artery disease. She has a distant history of anterior wall MI 1998. She had a bare-metal stent. She had reintervention in 2008. 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 She was recently hospitalized in May of 2013 for chest pain. She had no limiting lesions or coronaries. She has a followup CT scan which showed ectasia of the aorta but no significant aneurysm. However there were abnormalities in her liver that need further workup. I reviewed notes from Dr. Owens Loffler. He is referring her to [facility] for possible ERCP. She has dilated distal bile duct. She was also inquiring about having a less expensive drug and Lipitor for her cholesterol. Since her LDL has been under 100 the past I think is reasonable to switch her to [place] 40 of Zocor. She has not had any significant jaundice biliary colic nausea vomiting or diarrhea. She has not had any recurrent chest pain PND orthopnea palpitations or syncope.  Quit smoking last year Husband had DCM and sees Bensimohn Stopped her coreg because she felt weak  Restarted at lower dose and tolerating BP still too high  Echo 08/16/13 Normal EF  Study Conclusions  - Left ventricle: The cavity size was normal. Wall thickness was normal. Systolic function was normal. The estimated ejection fraction was in the range of 55% to 60%. Wall motion was normal; there were no regional wall motion abnormalities. Doppler parameters are consistent with abnormal left ventricular relaxation (grade 1 diastolic dysfunction). The E/e' ratio is <10, suggesting normal LV filling pressure. - Mitral valve: Calcified annulus. Mild regurgitation. - Left atrium: The atrium was at the upper limits of normal in size. - Tricuspid valve: Mild regurgitation. - Pulmonary arteries: PA peak pressure: 69mm Hg (S). - Inferior vena cava: The vessel was  normal in size; the respirophasic diameter changes were in the normal range (= 50%); findings are consistent with normal central venous pressure.   Last visit added hyzaar for BP and this caused dizzyness.  She is seeing Dr Henrene Pastor for anemia    ROS: Denies fever, malais, weight loss, blurry vision, decreased visual acuity, cough, sputum, SOB, hemoptysis, pleuritic pain, palpitaitons, heartburn, abdominal pain, melena, lower extremity edema, claudication, or rash.  All other systems reviewed and negative  General: Affect appropriate Healthy:  appears stated age 14: normal Neck supple with no adenopathy JVP normal no bruits no thyromegaly Lungs clear with no wheezing and good diaphragmatic motion Heart:  S1/S2 no murmur, no rub, gallop or click PMI normal Abdomen: benighn, BS positve, no tenderness, no AAA no bruit.  No HSM or HJR Distal pulses intact with no bruits No edema Neuro non-focal Skin warm and dry No muscular weakness   Current Outpatient Prescriptions  Medication Sig Dispense Refill  . aspirin 325 MG tablet Take 325 mg by mouth daily.      . carvedilol (COREG) 6.25 MG tablet Take 6.25 mg by mouth 2 (two) times daily with a meal.      . clopidogrel (PLAVIX) 75 MG tablet Take 1 tablet (75 mg total) by mouth daily.  30 tablet  6  . losartan-hydrochlorothiazide (HYZAAR) 100-25 MG per tablet Take one tablet by mouth daily at noon  30 tablet  6  . simvastatin (ZOCOR) 40 MG tablet TAKE ONE TABLET BY MOUTH EVERY DAY AT BEDTIME  30 tablet  6   No  current facility-administered medications for this visit.    Allergies  Review of patient's allergies indicates no known allergies.  Electrocardiogram:  SR ICRBBB   Assessment and Plan

## 2013-10-20 NOTE — Assessment & Plan Note (Signed)
Euvolemic functional class one  Continue current meds except lower ARB dose

## 2013-10-20 NOTE — Assessment & Plan Note (Signed)
Cholesterol is at goal.  Continue current dose of statin and diet Rx.  No myalgias or side effects.  F/U  LFT's in 6 months. Lab Results  Component Value Date   LDLCALC 80 07/28/2013             

## 2013-10-20 NOTE — Patient Instructions (Signed)
Your physician recommends that you schedule a follow-up appointment in: NEXT  AVAILABLE WITH  DR NISHAN  Your physician recommends that you continue on your current medications as directed. Please refer to the Current Medication list given to you today. 

## 2013-10-20 NOTE — Assessment & Plan Note (Signed)
Stable with no angina and good activity level.  Continue medical Rx  

## 2013-11-17 ENCOUNTER — Telehealth: Payer: Self-pay

## 2013-11-17 ENCOUNTER — Encounter: Payer: Self-pay | Admitting: Internal Medicine

## 2013-11-17 ENCOUNTER — Ambulatory Visit (INDEPENDENT_AMBULATORY_CARE_PROVIDER_SITE_OTHER): Payer: BC Managed Care – PPO | Admitting: Internal Medicine

## 2013-11-17 VITALS — BP 146/88 | HR 72 | Ht 63.0 in | Wt 145.8 lb

## 2013-11-17 DIAGNOSIS — I2510 Atherosclerotic heart disease of native coronary artery without angina pectoris: Secondary | ICD-10-CM

## 2013-11-17 DIAGNOSIS — D509 Iron deficiency anemia, unspecified: Secondary | ICD-10-CM

## 2013-11-17 MED ORDER — FERROUS SULFATE 325 (65 FE) MG PO TABS: 325.0000 mg | ORAL_TABLET | Freq: Two times a day (BID) | ORAL | Status: DC

## 2013-11-17 MED ORDER — MOVIPREP 100 G PO SOLR: 1.0000 | Freq: Once | ORAL | Status: DC

## 2013-11-17 NOTE — Telephone Encounter (Signed)
11/17/2013   RE: [name] DOB: Oct 19, 1950 MRN: [id number]   Dear Dr. Johnsie Cancel,    We have scheduled the above patient for an endoscopic procedure. Our records show that she is on anticoagulation therapy.   Please advise as to how long the patient may come off her therapy of Plavix prior to the procedure, which is scheduled for 01/05/2014.  Please fax back/ or route the completed form to [place] at [phone].   Sincerely,    Phillis Haggis

## 2013-11-17 NOTE — Progress Notes (Signed)
HISTORY OF PRESENT ILLNESS:  [name] is a 63 y.o. female with hypertension, hyperlipidemia, and coronary artery disease for which she has undergone prior stent placement now on aspirin and Plavix. She was seen remotely in this office (Dr. Ardis Hughs) for abnormal biliary abnormality and hepatic cyst. Evaluated extensively here and at [facility]. 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 She is sent today regarding new onset iron deficiency anemia. Patient underwent her routine annual checkup with her cardiologist. Blood work was obtained 07/28/2013. She was found to be significantly anemic with a hemoglobin of 9.8. MCV 80.9. Additional blood work obtained 08/10/2013 revealed iron deficiency with a ferritin of 3.3. She was told to see her primary care physician. She did not have a PCP. She then established with Dr. Carrolyn Meiers. Dr. Forde Dandy evaluated her recently (office note 09/27/2013 review). She is now referred to this office. She is accompanied by her husband (who is upset over his known diagnosis of an unspecified renal lesion for which he is seeing a urologist soon). The patient denies a history of GI problems or evaluations. No family history of GI malignancy. She denies a personal history of anemia. She denies being a blood donor. Last menstrual period 13 years ago. She does not use NSAIDs other than her regular aspirin. Extensive review of her GI review of systems is entirely negative. Her only complaint has been fatigue.  REVIEW OF SYSTEMS:  All non-GI ROS negative except for fatigue  Past Medical History  Diagnosis Date  . HYPERLIPIDEMIA   . TOBACCO ABUSE   . HYPERTENSION   . CAD   . ISCHEMIC CARDIOMYOPATHY   . CONGESTIVE HEART FAILURE, SYSTOLIC, CHRONIC   . ABDOMINAL AORTIC ANEURYSM   . 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 CT, CHEST, ABNORMAL   . 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 Anemia     Past Surgical History   Procedure Laterality Date  . Bare metal stent placement      left anterior descending artery    Social History [name]  reports that she quit smoking about 16 months ago. Her smoking use included Cigarettes. She smoked 0.00 packs per day. She has never used smokeless tobacco. She reports that she does not drink alcohol or use illicit drugs.  family history includes Diabetes in her maternal grandmother; Lymphoma in her mother.  No Known Allergies     PHYSICAL EXAMINATION: Vital signs: BP 146/88  Pulse 72  Ht 5\' 3"  (1.6 m)  Wt 145 lb 12.8 oz (66.134 kg)  BMI 25.83 kg/m2  Constitutional: generally well-appearing, no acute distress. Pale Psychiatric: alert and oriented x3, cooperative Eyes: extraocular movements intact, anicteric, conjunctiva pink Mouth: oral pharynx moist, no lesions Neck: supple no lymphadenopathy Cardiovascular: heart regular rate and rhythm, no murmur Lungs: clear to auscultation bilaterally Abdomen: soft, nontender, nondistended, no obvious ascites, no peritoneal signs, normal bowel sounds, no organomegaly Rectal: Deferred until colonoscopy Extremities: no lower extremity edema bilaterally Skin: no lesions on visible extremities Neuro: No focal deficits.   ASSESSMENT:  #1. Iron deficiency anemia. Rule out occult GI lesion (types of pathology possible reviewed with patient and husband). Rule out sprue #2. Coronary artery disease on aspirin Plavix. Stable #3. Other general medical problems. Stable   PLAN:  #1. Colonoscopy and upper endoscopy with duodenal biopsies as needed.The nature of the procedure, as well as the risks, benefits, and alternatives were carefully and thoroughly reviewed with the patient.  Ample time for discussion and questions allowed. The patient understood, was satisfied, and agreed to proceed. Movi prep prescribed. Patient instructed on its use #2. Initiate iron sulfate 325 mg twice a day #3. Continue on aspirin but hold  Plavix 5 days prior to the procedure if okayed by her cardiologist, Dr. Johnsie Cancel. We will communicate with him regarding this request. The patient and her husband are aware.

## 2013-11-17 NOTE — Telephone Encounter (Signed)
Ok to hold plavix for 5 days 

## 2013-11-17 NOTE — Patient Instructions (Signed)
You have been scheduled for an endoscopy and colonoscopy with propofol. Please follow the written instructions given to you at your visit today. Please pick up your prep at the pharmacy within the next 1-3 days. If you use inhalers (even only as needed), please bring them with you on the day of your procedure. Your physician has requested that you go to www.startemmi.com and enter the access code given to you at your visit today. This web site gives a general overview about your procedure. However, you should still follow specific instructions given to you by our office regarding your preparation for the procedure.  We have sent the following medications to your pharmacy for you to pick up at your convenience: Iron Sulfate

## 2013-11-18 NOTE — Telephone Encounter (Signed)
Spoke with patient's husband and communicated that Dr. Johnsie Cancel said it was ok for her to hold her Plavix for 5 days prior to her procedure.  He acknowledged and understood and agreed to tell patient as soon as she returned home.

## 2013-11-22 ENCOUNTER — Encounter: Payer: Self-pay | Admitting: Internal Medicine

## 2013-12-16 ENCOUNTER — Ambulatory Visit (INDEPENDENT_AMBULATORY_CARE_PROVIDER_SITE_OTHER): Payer: BC Managed Care – PPO | Admitting: Cardiovascular Disease

## 2013-12-16 ENCOUNTER — Encounter: Payer: Self-pay | Admitting: Cardiovascular Disease

## 2013-12-16 VITALS — BP 135/73 | HR 86 | Ht 63.0 in | Wt 144.8 lb

## 2013-12-16 DIAGNOSIS — E785 Hyperlipidemia, unspecified: Secondary | ICD-10-CM

## 2013-12-16 DIAGNOSIS — I10 Essential (primary) hypertension: Secondary | ICD-10-CM

## 2013-12-16 DIAGNOSIS — I2510 Atherosclerotic heart disease of native coronary artery without angina pectoris: Secondary | ICD-10-CM

## 2013-12-16 MED ORDER — CARVEDILOL 6.25 MG PO TABS: 3.1250 mg | ORAL_TABLET | Freq: Two times a day (BID) | ORAL | Status: DC

## 2013-12-16 MED ORDER — LOSARTAN POTASSIUM-HCTZ 50-12.5 MG PO TABS: 1.0000 | ORAL_TABLET | Freq: Every day | ORAL | Status: DC

## 2013-12-16 NOTE — Patient Instructions (Signed)
Your physician wants you to follow-up in:  6 MONTHS WITH DR NISHAN  You will receive a reminder letter in the mail two months in advance. If you don't receive a letter, please call our office to schedule the follow-up appointment. Your physician recommends that you continue on your current medications as directed. Please refer to the Current Medication list given to you today. 

## 2013-12-16 NOTE — Progress Notes (Signed)
Patient ID: [name], female   DOB: March 23, 1951, 63 y.o.   MRN: [id number] [name] is seen today in followup for coronary artery disease. She has a distant history of anterior wall MI 1998. She had a bare-metal stent. She had reintervention in 2008. 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 She was recently hospitalized in May of 2013 for chest pain. She had no limiting lesions or coronaries. She has a followup CT scan which showed ectasia of the aorta but no significant aneurysm. However there were abnormalities in her liver that need further workup. I reviewed notes from Dr. Owens [name]. He is referring her to [name] for possible ERCP. She has dilated distal bile duct. She was also inquiring about having a less expensive drug and Lipitor for her cholesterol. Since her LDL has been under 100 the past I think is reasonable to switch her to [place] 40 of Zocor. She has not had any significant jaundice biliary colic nausea vomiting or diarrhea. She has not had any recurrent chest pain PND orthopnea palpitations or syncope.  Quit smoking last year Husband had DCM and sees [name] Stopped her coreg because she felt weak  Restarted at lower dose and tolerating BP still too high  Echo 08/16/13 Normal EF  Study Conclusions  - Left ventricle: The cavity size was normal. Wall thickness was normal. Systolic function was normal. The estimated ejection fraction was in the range of 55% to 60%. Wall motion was normal; there were no regional wall motion abnormalities. Doppler parameters are consistent with abnormal left ventricular relaxation (grade 1 diastolic dysfunction). The E/e' ratio is <10, suggesting normal LV filling pressure. - Mitral valve: Calcified annulus. Mild regurgitation. - Left atrium: The atrium was at the upper limits of normal in size. - Tricuspid valve: Mild regurgitation. - Pulmonary arteries: PA peak pressure: 79m Hg (S). - Inferior vena cava: The vessel was  normal in size; the respirophasic diameter changes were in the normal range (= 50%); findings are consistent with normal central venous pressure.  Last visit added hyzaar for BP and this caused dizzyness. She is seeing Dr Destiny Pastorfor anemia   Having endo and colon August  Husband [name] renal cell carcinoma and is having surgery with Dr Destiny MeckelMonday       ROS: Denies fever, malais, weight loss, blurry vision, decreased visual acuity, cough, sputum, SOB, hemoptysis, pleuritic pain, palpitaitons, heartburn, abdominal pain, melena, lower extremity edema, claudication, or rash.  All other systems reviewed and negative  General: Affect appropriate Healthy:  appears stated age H33 normal Neck supple with no adenopathy JVP normal no bruits no thyromegaly Lungs clear with no wheezing and good diaphragmatic motion Heart:  S1/S2 no murmur, no rub, gallop or click PMI normal Abdomen: benighn, BS positve, no tenderness, no AAA no bruit.  No HSM or HJR Distal pulses intact with no bruits No edema Neuro non-focal Skin warm and dry No muscular weakness   Current Outpatient Prescriptions  Medication Sig Dispense Refill  . aspirin 325 MG tablet Take 325 mg by mouth daily.      . carvedilol (COREG) 6.25 MG tablet Take 6.25 mg by mouth 2 (two) times daily with a meal.      . clopidogrel (PLAVIX) 75 MG tablet Take 1 tablet (75 mg total) by mouth daily.  30 tablet  6  . ferrous sulfate 325 (65 FE) MG tablet Take 1 tablet (325 mg total) by mouth 2 (two) times daily.  60 tablet  6  . losartan-hydrochlorothiazide (HYZAAR) 100-25 MG per tablet Take one tablet by mouth daily at noon  30 tablet  6  . MOVIPREP 100 G SOLR Take 1 kit (200 g total) by mouth once.  1 kit  0  . simvastatin (ZOCOR) 40 MG tablet TAKE ONE TABLET BY MOUTH EVERY DAY AT BEDTIME  30 tablet  6   No current facility-administered medications for this visit.    Allergies  Review of patient's allergies indicates no known  allergies.  Electrocardiogram: SR 89  [id number]   Assessment and Plan

## 2013-12-16 NOTE — Assessment & Plan Note (Signed)
Cholesterol is at goal.  Continue current dose of statin and diet Rx.  No myalgias or side effects.  F/U  LFT's in 6 months. Lab Results  Component Value Date   LDLCALC 80 07/28/2013

## 2013-12-16 NOTE — Assessment & Plan Note (Signed)
Well controlled.  Continue current medications and low sodium Dash type diet.    

## 2013-12-16 NOTE — Assessment & Plan Note (Signed)
Stable with no angina and good activity level.  Continue medical Rx  

## 2014-01-01 DIAGNOSIS — K922 Gastrointestinal hemorrhage, unspecified: Secondary | ICD-10-CM

## 2014-01-01 HISTORY — DX: Gastrointestinal hemorrhage, unspecified: K92.2

## 2014-01-05 ENCOUNTER — Inpatient Hospital Stay (HOSPITAL_COMMUNITY)
Admission: EM | Admit: 2014-01-05 | Discharge: 2014-01-07 | DRG: 920 | Disposition: A | Discharge status: Home / Self Care | Payer: BC Managed Care – PPO | Attending: Internal Medicine | Admitting: Internal Medicine

## 2014-01-05 ENCOUNTER — Encounter (HOSPITAL_COMMUNITY)
Admission: EM | Disposition: A | Discharge status: Home / Self Care | Payer: Self-pay | Source: Home / Self Care | Attending: Internal Medicine

## 2014-01-05 ENCOUNTER — Emergency Department (HOSPITAL_COMMUNITY): Payer: BC Managed Care – PPO

## 2014-01-05 ENCOUNTER — Telehealth: Payer: Self-pay | Admitting: Internal Medicine

## 2014-01-05 ENCOUNTER — Ambulatory Visit (AMBULATORY_SURGERY_CENTER): Payer: BC Managed Care – PPO | Admitting: Internal Medicine

## 2014-01-05 ENCOUNTER — Encounter: Payer: Self-pay | Admitting: Internal Medicine

## 2014-01-05 ENCOUNTER — Encounter (HOSPITAL_COMMUNITY): Payer: Self-pay | Admitting: Emergency Medicine

## 2014-01-05 VITALS — BP 163/84 | HR 71 | Temp 98.2°F | Resp 14 | Ht 63.0 in | Wt 145.0 lb

## 2014-01-05 DIAGNOSIS — K2970 Gastritis, unspecified, without bleeding: Secondary | ICD-10-CM

## 2014-01-05 DIAGNOSIS — D126 Benign neoplasm of colon, unspecified: Secondary | ICD-10-CM | POA: Diagnosis present

## 2014-01-05 DIAGNOSIS — I10 Essential (primary) hypertension: Secondary | ICD-10-CM | POA: Diagnosis present

## 2014-01-05 DIAGNOSIS — Z807 Family history of other malignant neoplasms of lymphoid, hematopoietic and related tissues: Secondary | ICD-10-CM

## 2014-01-05 DIAGNOSIS — Z79899 Other long term (current) drug therapy: Secondary | ICD-10-CM

## 2014-01-05 DIAGNOSIS — K2990 Gastroduodenitis, unspecified, without bleeding: Secondary | ICD-10-CM

## 2014-01-05 DIAGNOSIS — Z7982 Long term (current) use of aspirin: Secondary | ICD-10-CM

## 2014-01-05 DIAGNOSIS — D62 Acute posthemorrhagic anemia: Secondary | ICD-10-CM | POA: Diagnosis present

## 2014-01-05 DIAGNOSIS — Z860100 Personal history of colon polyps, unspecified: Secondary | ICD-10-CM

## 2014-01-05 DIAGNOSIS — D509 Iron deficiency anemia, unspecified: Secondary | ICD-10-CM | POA: Diagnosis present

## 2014-01-05 DIAGNOSIS — K648 Other hemorrhoids: Secondary | ICD-10-CM | POA: Diagnosis present

## 2014-01-05 DIAGNOSIS — K922 Gastrointestinal hemorrhage, unspecified: Secondary | ICD-10-CM

## 2014-01-05 DIAGNOSIS — D175 Benign lipomatous neoplasm of intra-abdominal organs: Secondary | ICD-10-CM | POA: Diagnosis present

## 2014-01-05 DIAGNOSIS — K5730 Diverticulosis of large intestine without perforation or abscess without bleeding: Secondary | ICD-10-CM | POA: Diagnosis present

## 2014-01-05 DIAGNOSIS — I2510 Atherosclerotic heart disease of native coronary artery without angina pectoris: Secondary | ICD-10-CM | POA: Diagnosis present

## 2014-01-05 DIAGNOSIS — E785 Hyperlipidemia, unspecified: Secondary | ICD-10-CM | POA: Diagnosis present

## 2014-01-05 DIAGNOSIS — Z9861 Coronary angioplasty status: Secondary | ICD-10-CM

## 2014-01-05 DIAGNOSIS — IMO0002 Reserved for concepts with insufficient information to code with codable children: Principal | ICD-10-CM | POA: Diagnosis present

## 2014-01-05 DIAGNOSIS — Z87891 Personal history of nicotine dependence: Secondary | ICD-10-CM

## 2014-01-05 DIAGNOSIS — I2589 Other forms of chronic ischemic heart disease: Secondary | ICD-10-CM | POA: Diagnosis present

## 2014-01-05 DIAGNOSIS — Y849 Medical procedure, unspecified as the cause of abnormal reaction of the patient, or of later complication, without mention of misadventure at the time of the procedure: Secondary | ICD-10-CM | POA: Diagnosis present

## 2014-01-05 HISTORY — PX: COLONOSCOPY: SHX5424

## 2014-01-05 HISTORY — DX: Personal history of colonic polyps: Z86.010

## 2014-01-05 HISTORY — DX: Personal history of colon polyps, unspecified: Z86.0100

## 2014-01-05 LAB — COMPREHENSIVE METABOLIC PANEL
ALT: 18 U/L (ref 0–35)
AST: 22 U/L (ref 0–37)
Albumin: 3.7 g/dL (ref 3.5–5.2)
Alkaline Phosphatase: 58 U/L (ref 39–117)
Anion gap: 12 (ref 5–15)
BUN: 9 mg/dL (ref 6–23)
CO2: 23 meq/L (ref 19–32)
CREATININE: 0.69 mg/dL (ref 0.50–1.10)
Calcium: 8.6 mg/dL (ref 8.4–10.5)
Chloride: 104 mEq/L (ref 96–112)
GFR calc Af Amer: >90 mL/min (ref >90)
GFR calc non Af Amer: >90 mL/min (ref >90)
Glucose, Bld: 125 mg/dL — ABNORMAL HIGH (ref 70–99)
Potassium: 3.6 mEq/L — ABNORMAL LOW (ref 3.7–5.3)
Sodium: 139 mEq/L (ref 137–147)
Total Protein: 6.6 g/dL (ref 6.0–8.3)

## 2014-01-05 LAB — HEMOGLOBIN AND HEMATOCRIT, BLOOD
HEMATOCRIT: 26.7 % — AB (ref 36.0–46.0)
Hemoglobin: 8.7 g/dL — ABNORMAL LOW (ref 12.0–15.0)

## 2014-01-05 LAB — CBC
HEMATOCRIT: 32.5 % — AB (ref 36.0–46.0)
Hemoglobin: 10.5 g/dL — ABNORMAL LOW (ref 12.0–15.0)
MCH: 27.8 pg (ref 26.0–34.0)
MCHC: 32.3 g/dL (ref 30.0–36.0)
MCV: 86 fL (ref 78.0–100.0)
PLATELETS: 256 10*3/uL (ref 150–400)
RBC: 3.78 MIL/uL — AB (ref 3.87–5.11)
RDW: 20 % — ABNORMAL HIGH (ref 11.5–15.5)
WBC: 9.3 10*3/uL (ref 4.0–10.5)

## 2014-01-05 LAB — PREPARE RBC (CROSSMATCH)

## 2014-01-05 LAB — PROTIME-INR
INR: 1.09 (ref 0.00–1.49)
Prothrombin Time: 14.1 seconds (ref 11.6–15.2)

## 2014-01-05 LAB — ABO/RH: ABO/RH(D): A POS

## 2014-01-05 SURGERY — COLONOSCOPY
Anesthesia: Moderate Sedation

## 2014-01-05 MED ORDER — OMEPRAZOLE 20 MG PO CPDR: 20.0000 mg | DELAYED_RELEASE_CAPSULE | Freq: Every day | ORAL | Status: DC

## 2014-01-05 MED ORDER — FENTANYL CITRATE 0.05 MG/ML IJ SOLN
INTRAMUSCULAR | Status: AC
  Filled 2014-01-05: qty 4

## 2014-01-05 MED ORDER — HYDROCODONE-ACETAMINOPHEN 5-325 MG PO TABS: 1.0000 | ORAL_TABLET | ORAL | Status: DC | PRN

## 2014-01-05 MED ORDER — SODIUM CHLORIDE 0.9 % IJ SOLN
3.0000 mL | Freq: Two times a day (BID) | INTRAMUSCULAR | Status: DC
  Administered 2014-01-06 – 2014-01-07 (×2): 3 mL via INTRAVENOUS

## 2014-01-05 MED ORDER — SODIUM CHLORIDE 0.9 % IV SOLN: Freq: Once | INTRAVENOUS | Status: DC

## 2014-01-05 MED ORDER — SODIUM CHLORIDE 0.9 % IV BOLUS (SEPSIS)
1000.0000 mL | Freq: Once | INTRAVENOUS | Status: AC
  Administered 2014-01-05: 1000 mL via INTRAVENOUS

## 2014-01-05 MED ORDER — MIDAZOLAM HCL 10 MG/2ML IJ SOLN
INTRAMUSCULAR | Status: AC
  Filled 2014-01-05: qty 2

## 2014-01-05 MED ORDER — DIPHENHYDRAMINE HCL 50 MG/ML IJ SOLN
INTRAMUSCULAR | Status: AC
  Filled 2014-01-05: qty 1

## 2014-01-05 MED ORDER — ACETAMINOPHEN 650 MG RE SUPP: 650.0000 mg | Freq: Four times a day (QID) | RECTAL | Status: DC | PRN

## 2014-01-05 MED ORDER — SODIUM CHLORIDE 0.9 % IJ SOLN
INTRAMUSCULAR | Status: DC | PRN
  Administered 2014-01-05: 18:00:00

## 2014-01-05 MED ORDER — MIDAZOLAM HCL 5 MG/5ML IJ SOLN
INTRAMUSCULAR | Status: DC | PRN
  Administered 2014-01-05: 2 mg via INTRAVENOUS
  Administered 2014-01-05 (×3): 1 mg via INTRAVENOUS

## 2014-01-05 MED ORDER — ONDANSETRON HCL 4 MG/2ML IJ SOLN: 4.0000 mg | Freq: Four times a day (QID) | INTRAMUSCULAR | Status: DC | PRN

## 2014-01-05 MED ORDER — MIDAZOLAM HCL 10 MG/2ML IJ SOLN
INTRAMUSCULAR | Status: AC
  Filled 2014-01-05: qty 4

## 2014-01-05 MED ORDER — FENTANYL CITRATE 0.05 MG/ML IJ SOLN
INTRAMUSCULAR | Status: DC | PRN
  Administered 2014-01-05: 25 ug via INTRAVENOUS

## 2014-01-05 MED ORDER — HYDROMORPHONE HCL PF 1 MG/ML IJ SOLN: 0.5000 mg | INTRAMUSCULAR | Status: DC | PRN

## 2014-01-05 MED ORDER — ONDANSETRON HCL 4 MG PO TABS: 4.0000 mg | ORAL_TABLET | Freq: Four times a day (QID) | ORAL | Status: DC | PRN

## 2014-01-05 MED ORDER — DIPHENHYDRAMINE HCL 50 MG/ML IJ SOLN
INTRAMUSCULAR | Status: DC | PRN
  Administered 2014-01-05: 25 mg via INTRAVENOUS

## 2014-01-05 MED ORDER — SODIUM CHLORIDE 0.9 % IV SOLN
Freq: Once | INTRAVENOUS | Status: AC
  Administered 2014-01-05: 20:00:00 via INTRAVENOUS

## 2014-01-05 MED ORDER — SODIUM CHLORIDE 0.9 % IV SOLN: 500.0000 mL | INTRAVENOUS | Status: DC

## 2014-01-05 MED ORDER — DEXTROSE-NACL 5-0.45 % IV SOLN
INTRAVENOUS | Status: DC
  Administered 2014-01-05: 23:00:00 via INTRAVENOUS

## 2014-01-05 MED ORDER — ACETAMINOPHEN 325 MG PO TABS
650.0000 mg | ORAL_TABLET | Freq: Four times a day (QID) | ORAL | Status: DC | PRN
Start: 2014-01-05 — End: 2014-01-07

## 2014-01-05 NOTE — ED Provider Notes (Signed)
CSN: [id number]     Arrival date & time 01/05/14  1542 History   First MD Initiated Contact with Patient 01/05/14 1543     Chief Complaint  Patient presents with  . Rectal Bleeding    post polyp removal     (Consider location/radiation/quality/duration/timing/severity/associated sxs/prior Treatment) HPI Comments: [name] is a 63 y.o. Female with a PMHx of HLD, HTN, CAD, ischemic cardiomyopathy, anterior wall MI s/p bare metal stent (1998) and LAD (2008) on Plavix and ASA, aortic ectasia, systolic CHF (EF 51-76%), biliary cysts/dilated bile duct, and iron deficiency anemia, s/p endoscopy/colonoscopy today at [facility] during which she had polyps resected, presenting to the ED today with complaints of moderate amount of bright red rectal bleeding and lightheadedness x1 hour since her colonoscopy. Symptoms are constant and unchanged. States that she went to have a BM and noticed bright red blood with clots in the toilet. Has had at least 2 episodes since then. Continues to pass gas. Lightheadedness occurring when going from sitting to standing. No known aggravating or alleviating factors otherwise. Denies vertigo symptoms. Denies CP, SOB, abd pain, N/V/D/C, fevers, HA, vision changes, unilateral weakness, or paresthesias. Has been off plavix x5 days, and told to continuing holding x2 wks.  Patient is a 63 y.o. female presenting with hematochezia. The history is provided by the patient. No language interpreter was used.  Rectal Bleeding Quality:  Bright red Amount:  Moderate Duration:  1 hour Timing:  Constant Progression:  Unchanged Chronicity:  New Context: spontaneously   Context: not constipation and not hemorrhoids   Context comment:  Recent colonoscopy with polyp resection Similar prior episodes: no   Relieved by:  None tried Worsened by:  Nothing tried Ineffective treatments:  None tried Associated symptoms: light-headedness   Associated symptoms: no abdominal pain, no  dizziness, no epistaxis, no fever, no hematemesis, no loss of consciousness and no vomiting   Risk factors: anticoagulant use (on plavix but held x5 days prior to today, and told to continue holding x2 wks)     Past Medical History  Diagnosis Date  . HYPERLIPIDEMIA   . TOBACCO ABUSE   . HYPERTENSION   . CAD   . ISCHEMIC CARDIOMYOPATHY   . CONGESTIVE HEART FAILURE, SYSTOLIC, CHRONIC   . ABDOMINAL AORTIC ANEURYSM   . 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 CT, CHEST, ABNORMAL   . 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 Anemia    Past Surgical History  Procedure Laterality Date  . Bare metal stent placement      left anterior descending artery   Family History  Problem Relation Age of Onset  . Lymphoma Mother   . Diabetes Maternal Grandmother    History  Substance Use Topics  . Smoking status: Former Smoker    Types: Cigarettes    Quit date: 06/26/2012  . Smokeless tobacco: Never Used  . Alcohol Use: No     Comment: rare   OB History   Grav Para Term Preterm Abortions TAB SAB Ect Mult Living                 Review of Systems  Constitutional: Negative for fever and chills.  HENT: Negative for nosebleeds.   Eyes: Negative for visual disturbance.  Respiratory: Negative for shortness of breath.   Cardiovascular: Negative for chest pain and leg swelling.  Gastrointestinal: Positive for blood in stool and hematochezia. Negative for nausea, vomiting, abdominal pain, diarrhea, constipation, abdominal distention, rectal  pain and hematemesis.  Genitourinary: Negative for dysuria and hematuria.  Skin: Negative for color change.  Neurological: Positive for light-headedness. Negative for dizziness, loss of consciousness, syncope, weakness, numbness and headaches.  Psychiatric/Behavioral: Negative for confusion.  10 Systems reviewed and are negative for acute change except as noted in the HPI.     Allergies  Review of patient's allergies indicates no  known allergies.  Home Medications   Prior to Admission medications   Medication Sig Start Date End Date Taking? Authorizing Provider  aspirin 325 MG tablet Take 325 mg by mouth daily.   Yes Historical Provider, MD  carvedilol (COREG) 6.25 MG tablet Take 0.5 tablets (3.125 mg total) by mouth 2 (two) times daily with a meal. 12/16/13  Yes Josue Hector, MD  clopidogrel (PLAVIX) 75 MG tablet Take 1 tablet (75 mg total) by mouth daily. 08/09/13  Yes Josue Hector, MD  ferrous sulfate 325 (65 FE) MG tablet Take 1 tablet (325 mg total) by mouth 2 (two) times daily. 11/17/13  Yes Irene Shipper, MD  losartan-hydrochlorothiazide (HYZAAR) 50-12.5 MG per tablet Take 1 tablet by mouth daily. 12/16/13  Yes Josue Hector, MD  simvastatin (ZOCOR) 40 MG tablet Take 40 mg by mouth at bedtime.   Yes Historical Provider, MD  omeprazole (PRILOSEC) 20 MG capsule Take 1 capsule (20 mg total) by mouth daily. 01/05/14   Irene Shipper, MD   BP 136/66  Pulse 90  Temp(Src) 97.9 F (36.6 C) (Oral)  Resp 15  Ht 5\' 3"  (1.6 m)  Wt 143 lb (64.864 kg)  BMI 25.34 kg/m2  SpO2 99% Physical Exam  Nursing note and vitals reviewed. Constitutional: She is oriented to person, place, and time. Vital signs are normal. She appears well-developed and well-nourished. No distress.  HENT:  Head: Normocephalic and atraumatic.  Mouth/Throat: Mucous membranes are normal.  Eyes: Conjunctivae and EOM are normal. Right eye exhibits no discharge. Left eye exhibits no discharge.  Neck: Normal range of motion. Neck supple.  Cardiovascular: Normal rate, regular rhythm, normal heart sounds and intact distal pulses.  Exam reveals no gallop and no friction rub.   No murmur heard. Pulmonary/Chest: Effort normal and breath sounds normal. No respiratory distress. She has no decreased breath sounds. She has no wheezes. She has no rales.  Abdominal: Soft. Normal appearance and bowel sounds are normal. She exhibits no distension. There is no tenderness.  There is no rigidity, no rebound and no guarding.  Soft, NT/ND, +BS throughout, no r/g/r  Musculoskeletal: Normal range of motion.  Neurological: She is alert and oriented to person, place, and time.  Skin: Skin is warm, dry and intact. No rash noted.  Psychiatric: She has a normal mood and affect.    ED Course  Procedures (including critical care time) Labs Review Labs Reviewed  CBC - Abnormal; Notable for the following:    RBC 3.78 (*)    Hemoglobin 10.5 (*)    HCT 32.5 (*)    RDW 20.0 (*)    All other components within normal limits  COMPREHENSIVE METABOLIC PANEL - Abnormal; Notable for the following:    Potassium 3.6 (*)    Glucose, Bld 125 (*)    Total Bilirubin <0.2 (*)    All other components within normal limits  PROTIME-INR  HEMOGLOBIN AND HEMATOCRIT, BLOOD  HEMOGLOBIN AND HEMATOCRIT, BLOOD  HEMOGLOBIN AND HEMATOCRIT, BLOOD  BASIC METABOLIC PANEL  CBC  TYPE AND SCREEN  PREPARE RBC (CROSSMATCH)  ABO/RH  Imaging Review No results found.   EKG Interpretation None      MDM   Final diagnoses:  None   Rectal bleeding s/p colonoscopy with polyp resection today. Discussed with Dr. Regenia Skeeter, who informs me that [facility] plans to come by now and take pt to GI suite for colonoscopy to eval whether pt is actively bleeding from polyp resection sites. Pt will have labs and type/screen/crossmatch now. Will re-eval as necessary.  5:26 PM Pt to be admitted per GI. Please see their notes for further dictation of her care.   BP 136/66  Pulse 90  Temp(Src) 97.9 F (36.6 C) (Oral)  Resp 15  Ht 5\' 3"  (1.6 m)  Wt 143 lb (64.864 kg)  BMI 25.34 kg/m2  SpO2 99%  Meds ordered this encounter  Medications  . sodium chloride 0.9 % bolus 1,000 mL    Sig:   . 0.9 %  sodium chloride infusion    Sig:   . simvastatin (ZOCOR) 40 MG tablet    Sig: Take 40 mg by mouth at bedtime.  . ondansetron (ZOFRAN) tablet 4 mg    Sig:    Or  . ondansetron (ZOFRAN) injection 4 mg     Sig:   . sodium chloride 0.9 % injection 3 mL    Sig:   . dextrose 5 %-0.45 % sodium chloride infusion    Sig:   . acetaminophen (TYLENOL) tablet 650 mg    Sig:    Or  . acetaminophen (TYLENOL) suppository 650 mg    Sig:   . HYDROcodone-acetaminophen (NORCO/VICODIN) 5-325 MG per tablet 1-2 tablet    Sig:   . HYDROmorphone (DILAUDID) injection 0.5 mg    Sig:      Patty Sermons Camprubi-Soms, PA-C 01/05/14 1726

## 2014-01-05 NOTE — H&P (Signed)
Admission Note  Primary Care Physician:  Sheela Stack, MD Primary Gastroenterologist: Dr. Scarlette Shorts   MD  HPI: [name] is a 63 y.o. female , establish with Dr. Roque Cash in known to Dr. Scarlette Shorts who underwent colonoscopy and EGD earlier today for evaluation of an iron deficiency anemia.. At colonoscopy she was found to have a 3 cm sessile polyp in the cecum which was removed piecemeal with snare cautery. In one area there was a contained defect which was closed with Endo Clipping  x3. She also had an  ascending colon polyp 25 mm in size and sessile which was removed with snare cautery. She had 3 other polyps in the left colon also removed. Noted to have a few sigmoid diverticuli. EGD was unremarkable. About one hour after returning home post procedure she began having rectal bleeding. She first noticed blood in her underwear and then had 2 episodes of overt large volume red blood in the commode. She did see a few small clots. She called the office and was advised to come to the emergency room. She has had one further bloody stool since arrival in the ER. She did not have any dizziness or diaphoresis at home. She did eat half of a sandwich at about 1:30 this afternoon. Patient had been on chronic Plavix and aspirin but had held Plavix for 5 days prior to colonoscopy today she did continue on an 81 mg aspirin daily. Patient denies any abdominal pain or cramping. Blood pressure was 180/100 on arrival here today pulse 119. She has had fluid bolus started. Stat hemoglobin is 10.5. Stat abdominal films are being done and plan is for patient to have emergent repeat colonoscopy for hemostasis. She will be admitted for at least overnight observation      Past Surgical History  Procedure Laterality Date  . Bare metal stent placement      left anterior descending artery    Prior to Admission medications   Medication Sig Start Date End Date Taking? Authorizing Provider  aspirin 325  MG tablet Take 325 mg by mouth daily.   Yes Historical Provider, MD  carvedilol (COREG) 6.25 MG tablet Take 0.5 tablets (3.125 mg total) by mouth 2 (two) times daily with a meal. 12/16/13  Yes Josue Hector, MD  clopidogrel (PLAVIX) 75 MG tablet Take 1 tablet (75 mg total) by mouth daily. 08/09/13  Yes Josue Hector, MD  ferrous sulfate 325 (65 FE) MG tablet Take 1 tablet (325 mg total) by mouth 2 (two) times daily. 11/17/13  Yes Irene Shipper, MD  losartan-hydrochlorothiazide (HYZAAR) 50-12.5 MG per tablet Take 1 tablet by mouth daily. 12/16/13  Yes Josue Hector, MD  simvastatin (ZOCOR) 40 MG tablet Take 40 mg by mouth at bedtime.   Yes Historical Provider, MD  omeprazole (PRILOSEC) 20 MG capsule Take 1 capsule (20 mg total) by mouth daily. 01/05/14   Irene Shipper, MD    Current Facility-Administered Medications  Medication Dose Route Frequency Provider Last Rate Last Dose  . 0.9 %  sodium chloride infusion   Intravenous Once Ephraim Hamburger, MD      . acetaminophen (TYLENOL) tablet 650 mg  650 mg Oral Q6H PRN Opal Dinning S Rasmus Preusser, PA-C       Or  . acetaminophen (TYLENOL) suppository 650 mg  650 mg Rectal Q6H PRN Nimisha Rathel S Kurt Azimi, PA-C      . dextrose 5 %-0.45 % sodium chloride infusion   Intravenous Continuous  Lawrie Tunks S Raydin Bielinski, PA-C      . HYDROcodone-acetaminophen (NORCO/VICODIN) 5-325 MG per tablet 1-2 tablet  1-2 tablet Oral Q4H PRN Lauri Purdum S Ahmyah Gidley, PA-C      . HYDROmorphone (DILAUDID) injection 0.5 mg  0.5 mg Intravenous Q4H PRN Elisia Stepp S Aadi Bordner, PA-C      . ondansetron (ZOFRAN) tablet 4 mg  4 mg Oral Q6H PRN Judie Hollick S Jamia Hoban, PA-C       Or  . ondansetron (ZOFRAN) injection 4 mg  4 mg Intravenous Q6H PRN Solly Derasmo S Aimee Timmons, PA-C      . sodium chloride 0.9 % injection 3 mL  3 mL Intravenous Q12H Grae Leathers S Dailee Manalang, PA-C       Current Outpatient Prescriptions  Medication Sig Dispense Refill  . aspirin 325 MG tablet Take 325 mg by mouth daily.      . carvedilol (COREG) 6.25 MG tablet Take 0.5 tablets  (3.125 mg total) by mouth 2 (two) times daily with a meal.  60 tablet  11  . clopidogrel (PLAVIX) 75 MG tablet Take 1 tablet (75 mg total) by mouth daily.  30 tablet  6  . ferrous sulfate 325 (65 FE) MG tablet Take 1 tablet (325 mg total) by mouth 2 (two) times daily.  60 tablet  6  . losartan-hydrochlorothiazide (HYZAAR) 50-12.5 MG per tablet Take 1 tablet by mouth daily.  30 tablet  11  . simvastatin (ZOCOR) 40 MG tablet Take 40 mg by mouth at bedtime.      Marland Kitchen omeprazole (PRILOSEC) 20 MG capsule Take 1 capsule (20 mg total) by mouth daily.  30 capsule  11    Allergies as of 01/05/2014  . (No Known Allergies)    Family History  Problem Relation Age of Onset  . Lymphoma Mother   . Diabetes Maternal Grandmother     History   Social History  . Marital Status: Married    Spouse Name: N/A    Number of Children: N/A  . Years of Education: N/A   Occupational History  . Not on file.   Social History Main Topics  . Smoking status: Former Smoker    Types: Cigarettes    Quit date: 06/26/2012  . Smokeless tobacco: Never Used  . Alcohol Use: No     Comment: rare  . Drug Use: No  . Sexual Activity: Not on file   Other Topics Concern  . Not on file   Social History Narrative  . No narrative on file    Review of Systems:  All systems reviewed an negative except where noted in HPI. Marland Kitchen  Physical Exam: Vital signs in last 24 hours: Temp:  [97.9 F (36.6 C)-98.2 F (36.8 C)] 97.9 F (36.6 C) (08/05 1616) Pulse Rate:  [62-119] 90 (08/05 1553) Resp:  [13-39] 15 (08/05 1553) BP: (90-186)/(43-104) 136/66 mmHg (08/05 1550) SpO2:  [81 %-100 %] 99 % (08/05 1556) Weight:  [143 lb (64.864 kg)-145 lb (65.772 kg)] 143 lb (64.864 kg) (08/05 1617)   General:  Pleasant, well-developed, white  female in NAD Head:  Normocephalic and atraumatic. Eyes:  Sclera clear, no icterus.   Conjunctiva pink. Ears:  Normal auditory acuity. Mouth:  No deformity or lesions.  Neck:  Supple; no masses  . Lungs:  Clear throughout to auscultation.   No wheezes, crackles, or rhonchi. No acute distress. Heart:  Regular rate and rhythm; no murmurs. Abdomen:  Soft, nondistended, nontender. No masses, hepatomegaly. No obvious masses.  Normal bowel .    Rectal:  Not done Msk:  Symmetrical without gross deformities.. Pulses:  Normal pulses noted. Extremities:  Without edema. Neurologic:  Alert and  oriented x4;  grossly normal neurologically. Skin:  Intact without significant lesions or rashes. Cervical Nodes:  No significant cervical adenopathy. Psych:  Alert and cooperative. Normal mood and affect.  Lab Results:  Recent Labs  01/05/14 1606  WBC 9.3  HGB 10.5*  HCT 32.5*  PLT 256     Studies/Results: No results found.  Impression / Plan:  #51 63 year old female with history of iron deficiency anemia who underwent colonoscopy/EGD at the [facility]. earlier today, with multiple polyps found and removed. She had a   large 3cm sessile polyp in the cecum which was removed piecemeal with snare cautery post polypectomy there was a defect which was closed with Endo Clip x3, there was no active bleeding. She also had a 2.5 cm sessile polyp removed with snare cautery in the ascending colon . Patient now presenting with acute lower GI bleed which is clearly a post polypectomy hemorrhage and likely from the cecal polyp site. #2 anemia acute on chronic secondary to acute blood loss #3 history of coronary artery disease status post coronary stenting x2 last in 2008 #4 chronic antiplatelet therapy with aspirin and Plavix-Plavix on hold over the past 5 days #5 history of hypertension #6 hyperlipidemia #7 history of ischemic cardiomyopathy  Plan; patient is admitted to observation on the GI service. She will undergo emergent repeat colonoscopy this evening with Dr. Hilarie Fredrickson to control bleeding. Aspirin and Plavix will be kept on hold and we'll hold antihypertensives Serial hemoglobins and plan to transfuse for  hemoglobin 9 or less For further details please see the orders      LOS: 0 days   Makoto Sellitto  01/05/2014, 4:50 PM

## 2014-01-05 NOTE — Patient Instructions (Signed)
YOU HAD AN ENDOSCOPIC PROCEDURE TODAY AT THE [facility]: Refer to the procedure report that was given to you for any specific questions about what was found during the examination.  If the procedure report does not answer your questions, please call your gastroenterologist to clarify.  If you requested that your care partner not be given the details of your procedure findings, then the procedure report has been included in a sealed envelope for you to review at your convenience later.  YOU SHOULD EXPECT: Some feelings of bloating in the abdomen. Passage of more gas than usual.  Walking can help get rid of the air that was put into your GI tract during the procedure and reduce the bloating. If you had a lower endoscopy (such as a colonoscopy or flexible sigmoidoscopy) you may notice spotting of blood in your stool or on the toilet paper. If you underwent a bowel prep for your procedure, then you may not have a normal bowel movement for a few days.  DIET: Your first meal following the procedure should be a light meal and then it is ok to progress to your normal diet.  A half-sandwich or bowl of soup is an example of a good first meal.  Heavy or fried foods are harder to digest and may make you feel nauseous or bloated.  Likewise meals heavy in dairy and vegetables can cause extra gas to form and this can also increase the bloating.  Drink plenty of fluids but you should avoid alcoholic beverages for 24 hours.  ACTIVITY: Your care partner should take you home directly after the procedure.  You should plan to take it easy, moving slowly for the rest of the day.  You can resume normal activity the day after the procedure however you should NOT DRIVE or use heavy machinery for 24 hours (because of the sedation medicines used during the test).    SYMPTOMS TO REPORT IMMEDIATELY: A gastroenterologist can be reached at any hour.  During normal business hours, 8:30 AM to 5:00 PM Monday through Friday,  call [phone].  After hours and on weekends, please call the GI answering service at [phone] who will take a message and have the physician on call contact you.   Following lower endoscopy (colonoscopy or flexible sigmoidoscopy):  Excessive amounts of blood in the stool  Significant tenderness or worsening of abdominal pains  Swelling of the abdomen that is new, acute  Fever of 100F or higher  Following upper endoscopy (EGD)  Vomiting of blood or coffee ground material  New chest pain or pain under the shoulder blades  Painful or persistently difficult swallowing  New shortness of breath  Fever of 100F or higher  Black, tarry-looking stools  FOLLOW UP: If any biopsies were taken you will be contacted by phone or by letter within the next 1-3 weeks.  Call your gastroenterologist if you have not heard about the biopsies in 3 weeks.  Our staff will call the home number listed on your records the next business day following your procedure to check on you and address any questions or concerns that you may have at that time regarding the information given to you following your procedure. This is a courtesy call and so if there is no answer at the home number and we have not heard from you through the emergency physician on call, we will assume that you have returned to your regular daily activities without incident.  SIGNATURES/CONFIDENTIALITY: You and/or your care   partner have signed paperwork which will be entered into your electronic medical record.  These signatures attest to the fact that that the information above on your After Visit Summary has been reviewed and is understood.  Full responsibility of the confidentiality of this discharge information lies with you and/or your care-partner.  Continue aspirin, But Hold Plavix for 2 weeks then resume. Resume remainder of medications. Information given on polyps, diverticulosis, high fiber diet and gastritis  with discharge  instructions.

## 2014-01-05 NOTE — Op Note (Signed)
[facility] [place], [address]   COLONOSCOPY PROCEDURE REPORT  PATIENT: [name], Fass.  MR#: [id number] BIRTHDATE: 1950-07-28 , 62  yrs. old GENDER: Female ENDOSCOPIST: Jerene Bears, MD PROCEDURE DATE:  01/05/2014 PROCEDURE:   Colonoscopy with control of bleeding and Submucosal injection, any substance First Screening Colonoscopy - Avg.  risk and is 50 yrs.  old or older - No.  Prior Negative Screening - Now for repeat screening. N/A  History of Adenoma - Now for follow-up colonoscopy & has been > or = to 3 yrs.  N/A  Polyps Removed Today? No.  Recommend repeat exam, <10 yrs? Yes.  No reason given. ASA CLASS:   Class III INDICATIONS:Acute lower GI post-polypectomy bleeding. MEDICATIONS: These medications were titrated to patient response per physician's verbal order, Diphenhydramine (Benadryl) 25 mg IV, Versed 5 mg IV, Fentanyl 25 mcg IV, and Epinephrine 2.5 cc  DESCRIPTION OF PROCEDURE:   After the risks benefits and alternatives of the procedure were thoroughly explained, informed consent was obtained.  A digital rectal exam revealed no rectal mass and fresh blood.   The Pentax Adult Colonscope [id number] endoscope was introduced through the anus and advanced to the cecum, which was identified by the appearance and presence of 3 previously placed endoclips. No adverse events experienced.   The quality of the prep was none.  The instrument was then slowly withdrawn as the colon was fully examined Limited blood and clots.   COLON FINDINGS: Given high suspicion of post polypectomy bleeding colonoscopy was performed unprepped.  Copious blood and clot was seen immediately upon entering the colon, water lavage with minimal air insufflation was performed and the scope was advanced to the cecum.  In the cecum there was a large fresh clot with active bleeding. 3 previously placed hemostatic clips were seen in the cecum and intact. Just proximal to be  hemostatic clips a large post-polypectomy ulceration with brisk bleeding was seen. Copious irrigation lavage was performed. Epinephrine 1-10,000 was injected submucosally for control of bleeding, total 2.5 cc.  5 hemostatic clips were placed at the post-polypectomy ulceration with good control of hemorrhage.  This site was observed for several minutes and  no further bleeding was observed. The scope was withdrawn with view severely limited by blood and clots.  The more distal ascending colon polypectomy base was unable to be visualized due to blood clots which could not be aspirated, though no active bleeding was seen at this site. The scope was withdrawn further and in the sigmoid colon a post-polypectomy ulcer with blood clot was found. One hemostatic clip was placed at the base of the polypectomy ulcer with success. A small rectal post-polypectomy ulcer without active bleeding was seen.  Retroflexion was not performed.        The scope was withdrawn and the procedure completed.  COMPLICATIONS: There were no complications.  ENDOSCOPIC IMPRESSION: 1.  Actively bleeding post polypectomy ulcer in the cecum, 3 previously placed endoclips visualized and intact; 5 additional hemostatic clips placed at the cecal post polypectomy ulceration after epinephrine injection 2.  Post-polypectomy ulcer with clot in the sigmoid; hemostatic clip x 1  See above for further details  RECOMMENDATIONS: 1.  Admission to the hospital for close observation, serial hemoglobin, further resuscitation as necessary.  Blood transfusion as necessary to maintain hemoglobin greater than 8.0 g/dL 2.  Clear liquid diet 3.  Holding all anticoagulation and aspirin   eSigned:  Jerene Bears, MD 01/05/2014 6:41 PM  cc: The Patient and Scarlette Shorts, MD   PATIENT NAME:  [name], [name]. MR#: [id number]

## 2014-01-05 NOTE — Op Note (Signed)
[name]  [address] [place], [address]   COLONOSCOPY PROCEDURE REPORT  PATIENT: Destiny, [name].  MR#: [id number] BIRTHDATE: [birth year] , 62  yrs. old GENDER: Female ENDOSCOPIST: Eustace Quail, MD REFERRED [id number] Forde Dandy, M.D. PROCEDURE DATE:  01/05/2014 PROCEDURE:   Colonoscopy with snare polypectomy and Submucosal injection, any substance  and extended time /technical First Screening Colonoscopy - Avg.  risk and is 50 yrs.  old or older - No.  Prior Negative Screening - Now for repeat screening. N/A  History of Adenoma - Now for follow-up colonoscopy & has been > or = to 3 yrs.  N/A  Polyps Removed Today? Yes. ASA CLASS:   Class III INDICATIONS:Iron Deficiency Anemia. MEDICATIONS: MAC sedation, administered by CRNA and propofol (Diprivan) 1200mg  IV  DESCRIPTION OF PROCEDURE:   After the risks benefits and alternatives of the procedure were thoroughly explained, informed consent was obtained.  A digital rectal exam revealed no abnormalities of the rectum.   The LB [id number]  endoscope was introduced through the anus and advanced to the cecum, which was identified by both the appendix and ileocecal valve. No adverse events experienced.   The quality of the prep was excellent, using MoviPrep  The instrument was then slowly withdrawn as the colon was fully examined.      COLON FINDINGS: The colonoscope was advanced to the cecum without difficulty.  In the cecum was a 30 mm sessile polyp which was raised with saline and removed piecemeal with snare cauttery.  In one area there was a contain defect which seemed to be below the muscle layer.  This was closed with endoscopic clips x3.  The ascending colon revealed a 25 mm sessile polyp which was raised with saline and removed with snare cautery.  A 6 mm descending colon polyp, 7 mm sigmoid colon polyp, and 4 mm rectal polyp were removed with cold snare.  All polypoid tissue was retrieved  and submittted for pathologic analysis.  A few small diverticula noted sigmmoid region.  Retroflexed views revealed internal hemorrhoids. The time to cecum=3 minutes 33 seconds.  Withdrawal time=63 minutes 21 seconds.  The scope was withdrawn and the procedure completed. COMPLICATIONS: There were no complications.  ENDOSCOPIC IMPRESSION: 1. Large sessile cecal and ascending colon polyps removed piecemeal with snare cautery ( see above description ) 2.  Multiple small polyps removed with cold snare 3.  Mild diverticulosis.  RECOMMENDATIONS: 1.  Await pathology results 2.   Hold Plavix for 2 weeks then resume. Continue aspirin 3.   If no cancer in specimen, repeat colonoscopy in 4-6 months  4. EGD today. SEE RESULTS   eSigned:  Eustace Quail, MD 01/05/2014 1:02 PM   cc: Reynold Bowen, MD and The Patient   PATIENT NAME:  [name], [name]. MR#: [id number]

## 2014-01-05 NOTE — ED Provider Notes (Signed)
Medical screening examination/treatment/procedure(s) were conducted as a shared visit with non-physician practitioner(s) and myself.  I personally evaluated the patient during the encounter.   EKG Interpretation None       Angiocath insertion Performed by: Sherwood Gambler T  Consent: Verbal consent obtained. Risks and benefits: risks, benefits and alternatives were discussed Time out: Immediately prior to procedure a "time out" was called to verify the correct patient, procedure, equipment, support staff and site/side marked as required.  Preparation: Patient was prepped and draped in the usual sterile fashion.  Vein Location: right basilic  Ultrasound Guided  Gauge: 20  Normal blood return and flush without difficulty Patient tolerance: Patient tolerated the procedure well with no immediate complications.   Patient with multiple episodes of lower GI bleeding status post colonoscopy a few hours ago. Is currently hemodynamically stable, but is feeling symptomatic with dizziness. After discussion with gastrology, they will take her back to colonoscopy and will admit overnight.  Ephraim Hamburger, MD 01/05/14 [phone]

## 2014-01-05 NOTE — H&P (Signed)
Patient seen, examined, and I agree with the above documentation, including the assessment and plan. Patient presenting with acute lower GI bleeding in the setting of colonoscopy with polypectomy earlier today. The largest polypectomy sites or in the right colon, I expect this to be the source of bleeding. Plavix has been on hold Will plan colonoscopy for control of bleeding without preparation at this time. The nature of the procedure, as well as the risks, benefits, and alternatives were carefully and thoroughly reviewed with the patient. Ample time for discussion and questions allowed. The patient understood, was satisfied, and agreed to proceed.  Admit to the hospital for observation, at least. Monitor hemoglobin, transfuse if necessary

## 2014-01-05 NOTE — Progress Notes (Signed)
Called to room to assist during endoscopic procedure.  Patient ID and intended procedure confirmed with present staff. Received instructions for my participation in the procedure from the performing physician.  

## 2014-01-05 NOTE — ED Notes (Signed)
Unable to start second IV, even under ultrasound guidance.  Dr. Regenia Skeeter aware.

## 2014-01-05 NOTE — ED Notes (Addendum)
Patient is post-polyp removal (2 large, 3 small) today. Reports bright red bleeding after procedure. Per patient, "It filled the toilet." Felt dizziness and fatigue during bleeding episodes. Denies SOB, chest pain. At baseline, patient is on Plavix but hasn't had this for 5 days. Reports fatigue. Hx MI (x2). In NAD. A&Ox4. Moving all extremities. Awaiting MD.

## 2014-01-05 NOTE — Op Note (Signed)
[facility]  [address]. [place], [address]   ENDOSCOPY PROCEDURE REPORT  PATIENT: Destiny, [name].  MR#: [id number] BIRTHDATE: September 22, 1950 , 62  yrs. old GENDER: Female ENDOSCOPIST: Eustace Quail, MD REFERRED BY:  Reynold Bowen, M.D. PROCEDURE DATE:  01/05/2014 PROCEDURE:  EGD w/ biopsy ASA CLASS:     Class III INDICATIONS:  Iron deficiency anemia. MEDICATIONS: MAC sedation, administered by CRNA and propofol (Diprivan) 100mg  IV TOPICAL ANESTHETIC: none  DESCRIPTION OF PROCEDURE: After the risks benefits and alternatives of the procedure were thoroughly explained, informed consent was obtained.  The LB [id number] endoscope was introduced through the mouth and advanced to the second portion of the duodenum. Without limitations.  The instrument was slowly withdrawn as the mucosa was fully examined.      EXAM: Normal esophagus.  A few antral erosions.  Otherwise normal stomach.  Non erosive duodentits.  Otherwise normal exam.  CLO bx taken.  Retroflexed views revealed no abnormalities.     The scope was then withdrawn from the patient and the procedure completed.  COMPLICATIONS: There were no complications. ENDOSCOPIC IMPRESSION: Normal esophagus.  A few antral erosions.  Otherwise normal stomach. Non erosive duodentits.  Otherwise normal exam.  CLO bx taken  RECOMMENDATIONS: 1.   Rx CLO if positive 2.   Prescribe  omeprazole 20 mg daily ; #30; 11 refills  REPEAT EXAM:  eSigned:  Eustace Quail, MD 01/05/2014 1:07 PM   [id number] Forde Dandy, MD and The Patient

## 2014-01-05 NOTE — ED Notes (Signed)
Attempted to call report to floor RN. Floor RN noticed that cardiac monitoring had been ordered. Bed placement called and pt will be going to 1407.

## 2014-01-05 NOTE — Telephone Encounter (Signed)
Attempted to contact Dr. Henrene Pastor and doctor of day( Dr. Lonzo Cloud unsuccessful. Spoke with Dr. Ardis Hughs and informed him of pt. Stating that she was bleeding and passing  Blood  clots, Dr. Ardis Hughs instructed me to tell pt. To go to the E.D. Pt. Instructed to go to E.D. And to take copy of procedure report. Requested from pt. To let me know the hospital she was going to and she stated [facility], then I notified Amy Esterwood of pt. Status.

## 2014-01-05 NOTE — Progress Notes (Signed)
Report to PACU, RN, vss, BBS= Clear.  

## 2014-01-05 NOTE — ED Notes (Signed)
Pt transported to xray, will be sent to endo immediately following, and then to the floor from there.

## 2014-01-06 ENCOUNTER — Telehealth: Payer: Self-pay | Admitting: *Deleted

## 2014-01-06 ENCOUNTER — Encounter (HOSPITAL_COMMUNITY)
Admission: EM | Disposition: A | Discharge status: Home / Self Care | Payer: BC Managed Care – PPO | Source: Home / Self Care | Attending: Internal Medicine

## 2014-01-06 ENCOUNTER — Encounter (HOSPITAL_COMMUNITY): Payer: Self-pay | Admitting: Internal Medicine

## 2014-01-06 DIAGNOSIS — D62 Acute posthemorrhagic anemia: Secondary | ICD-10-CM

## 2014-01-06 HISTORY — PX: COLONOSCOPY: SHX5424

## 2014-01-06 LAB — PREPARE RBC (CROSSMATCH)

## 2014-01-06 LAB — MRSA PCR SCREENING: MRSA by PCR: NEGATIVE

## 2014-01-06 LAB — BASIC METABOLIC PANEL
Anion gap: 9 (ref 5–15)
BUN: 7 mg/dL (ref 6–23)
CALCIUM: 7.1 mg/dL — AB (ref 8.4–10.5)
CO2: 21 meq/L (ref 19–32)
Chloride: 110 mEq/L (ref 96–112)
Creatinine, Ser: 0.64 mg/dL (ref 0.50–1.10)
GFR calc Af Amer: >90 mL/min (ref >90)
GFR calc non Af Amer: >90 mL/min (ref >90)
GLUCOSE: 138 mg/dL — AB (ref 70–99)
POTASSIUM: 3.5 meq/L — AB (ref 3.7–5.3)
Sodium: 140 mEq/L (ref 137–147)

## 2014-01-06 LAB — CBC
HEMATOCRIT: 22.7 % — AB (ref 36.0–46.0)
Hemoglobin: 7.8 g/dL — ABNORMAL LOW (ref 12.0–15.0)
MCH: 29.5 pg (ref 26.0–34.0)
MCHC: 34.4 g/dL (ref 30.0–36.0)
MCV: 86 fL (ref 78.0–100.0)
PLATELETS: 170 10*3/uL (ref 150–400)
RBC: 2.64 MIL/uL — AB (ref 3.87–5.11)
RDW: 18.7 % — AB (ref 11.5–15.5)
WBC: 10.8 10*3/uL — ABNORMAL HIGH (ref 4.0–10.5)

## 2014-01-06 LAB — HEMOGLOBIN AND HEMATOCRIT, BLOOD
HEMATOCRIT: 25.9 % — AB (ref 36.0–46.0)
HEMATOCRIT: 31.3 % — AB (ref 36.0–46.0)
Hemoglobin: 8.9 g/dL — ABNORMAL LOW (ref 12.0–15.0)
Hemoglobin: 10.9 g/dL — ABNORMAL LOW (ref 12.0–15.0)

## 2014-01-06 LAB — HELICOBACTER PYLORI SCREEN-BIOPSY: UREASE: NEGATIVE

## 2014-01-06 SURGERY — COLONOSCOPY
Anesthesia: Moderate Sedation

## 2014-01-06 MED ORDER — FUROSEMIDE 10 MG/ML IJ SOLN: 20.0000 mg | Freq: Once | INTRAMUSCULAR | Status: DC

## 2014-01-06 MED ORDER — PEG-KCL-NACL-NASULF-NA ASC-C 100 G PO SOLR
1.0000 | Freq: Once | ORAL | Status: AC
  Administered 2014-01-06: 200 g via ORAL
  Filled 2014-01-06: qty 1

## 2014-01-06 MED ORDER — DIPHENHYDRAMINE HCL 50 MG/ML IJ SOLN
INTRAMUSCULAR | Status: DC | PRN
  Administered 2014-01-06: 12.5 mg via INTRAVENOUS

## 2014-01-06 MED ORDER — FUROSEMIDE 10 MG/ML IJ SOLN
20.0000 mg | Freq: Once | INTRAMUSCULAR | Status: AC
  Administered 2014-01-06: 20 mg via INTRAVENOUS
  Filled 2014-01-06: qty 2

## 2014-01-06 MED ORDER — FENTANYL CITRATE 0.05 MG/ML IJ SOLN
INTRAMUSCULAR | Status: DC | PRN
  Administered 2014-01-06 (×2): 25 ug via INTRAVENOUS

## 2014-01-06 MED ORDER — FENTANYL CITRATE 0.05 MG/ML IJ SOLN
INTRAMUSCULAR | Status: AC
  Filled 2014-01-06: qty 2

## 2014-01-06 MED ORDER — ACETAMINOPHEN 325 MG PO TABS
650.0000 mg | ORAL_TABLET | Freq: Once | ORAL | Status: AC
  Administered 2014-01-06: 650 mg via ORAL
  Filled 2014-01-06: qty 2

## 2014-01-06 MED ORDER — SODIUM CHLORIDE 0.9 % IV SOLN: Freq: Once | INTRAVENOUS | Status: DC

## 2014-01-06 MED ORDER — DIPHENHYDRAMINE HCL 50 MG/ML IJ SOLN
INTRAMUSCULAR | Status: AC
  Filled 2014-01-06: qty 1

## 2014-01-06 MED ORDER — MIDAZOLAM HCL 5 MG/5ML IJ SOLN
INTRAMUSCULAR | Status: DC | PRN
  Administered 2014-01-06: 2 mg via INTRAVENOUS
  Administered 2014-01-06: 1 mg via INTRAVENOUS

## 2014-01-06 MED ORDER — MIDAZOLAM HCL 10 MG/2ML IJ SOLN
INTRAMUSCULAR | Status: AC
  Filled 2014-01-06: qty 2

## 2014-01-06 MED ORDER — SODIUM CHLORIDE 0.9 % IV SOLN
Freq: Once | INTRAVENOUS | Status: AC
  Administered 2014-01-06: 07:00:00 via INTRAVENOUS

## 2014-01-06 NOTE — Progress Notes (Signed)
UR completed 

## 2014-01-06 NOTE — Telephone Encounter (Signed)
No phone call made to patient due to hospital admission.

## 2014-01-06 NOTE — Op Note (Signed)
[facility] [place], [address]   COLONOSCOPY PROCEDURE REPORT  PATIENT: Destiny, [name].  MR#: [id number] BIRTHDATE: 1950/06/21 , 62  yrs. old GENDER: Female ENDOSCOPIST: Jerene Bears, MD PROCEDURE DATE:  01/06/2014 PROCEDURE:   Colonoscopy with control of bleeding First Screening Colonoscopy - Avg.  risk and is 50 yrs.  old or older - No.  Prior Negative Screening - Now for repeat screening. N/A  History of Adenoma - Now for follow-up colonoscopy & has been > or = to 3 yrs.  N/A  Polyps Removed Today? No.  Recommend repeat exam, <10 yrs? Yes.  No reason given. ASA CLASS:   Class III INDICATIONS:post-polypectomy bleed, initial colonoscopy with polypectomy 01/05/2014, with colonoscopy control of bleeding later in the day on 01/05/14; now with further hematochezia MEDICATIONS: These medications were titrated to patient response per physician's verbal order, Diphenhydramine (Benadryl) 12.5 mg IV, Fentanyl 50 mcg IV, and Versed 4 mg IV  DESCRIPTION OF PROCEDURE:   After the risks benefits and alternatives of the procedure were thoroughly explained, informed consent was obtained.  A digital rectal exam revealed no rectal mass.   The Pentax Ped Colon [id number]  endoscope was introduced through the anus and advanced to the cecum, which was identified by both the appendix and ileocecal valve. No adverse events experienced.   The quality of the prep was adequate, using MoviPrep The instrument was then slowly withdrawn as the colon was fully examined.      COLON FINDINGS: Post-polypectomy ulceration with 3 previously placed endoclips was found in the cecum. Just proximal to the 3, original, endoclips a visible vessel was seen. 3 additional hemostatic clips were applied under this visible vessel with success.  In retrospect, and with better clearance of blood from the colon,  the 5 endoclips placed yesterday for active bleeding are located on  the post-polypectomy ulceration in the ascending colon (not the cecum as reported yesterday).  4 of these endoclips remain in place with no bleeding from this post-polypectomy site. There is a small lipoma near the hepatic flexure. Mild diverticulosis in the sigmoid colon. Several additional post-polypectomy sites without active bleeding or visible vessels were seen during withdrawal. Retroflexion revealed small internal hemorrhoids. The scope was withdrawn and the procedure completed.   COMPLICATIONS: There were no complications.  ENDOSCOPIC IMPRESSION: 1.  Post-polypectomy ulceration with visible vessel in the cecum, 3 additional endoclips placed 2.  Post-polypectomy ulceration with 4 previously placed endoclips in the ascending colon 3.  Small right colon lipoma 4.  Mild left-sided diverticulosis 5.  Additional smaller post-polypectomy sites without active bleeding or stigmata of bleeding  RECOMMENDATIONS: Continue close observation.  Monitor hemoglobin and hematocrit. Okay for clear liquid diet tonight advance tomorrow if no further bleeding.  Continue to hold aspirin and anticoagulants/antiplatelets   eSigned:  Jerene Bears, MD 01/06/2014 4:22 PM   cc: The Patient and Scarlette Shorts, MD   PATIENT NAME:  Destiny, [name]. MR#: [id number]

## 2014-01-06 NOTE — Progress Notes (Signed)
Stat HGB drawn now.

## 2014-01-06 NOTE — Progress Notes (Signed)
Patient ID: [name], female   DOB: [birth year], 63 y.o.   MRN: [id number]  [facility] Progress Note  Subjective: Weak, no pain or cramping, feels "swollen".. One fairly large BM bloody BM a few minutes ago- dark and red blood. HGb 7.8 early morning hours-receiving second unit  Objective:  Vital signs in last 24 hours: Temp:  [97.4 F (36.3 C)-98.3 F (36.8 C)] 98.3 F (36.8 C) (08/06 0815) Pulse Rate:  [62-124] 86 (08/06 0815) Resp:  [13-39] 18 (08/06 0815) BP: (60-186)/(32-104) 127/65 mmHg (08/06 0815) SpO2:  [81 %-100 %] 99 % (08/06 0815) Weight:  [143 lb (64.864 kg)-145 lb (65.772 kg)] 143 lb (64.864 kg) (08/05 1617)   General:   Alert,  Well-developed,pale WF    in NAD Heart:  Regular rate and rhythm; no murmurs slightly tachy Pulm;clear Abdomen:  Soft, nontender and nondistended. Normal bowel sounds, without guarding, and without rebound.   Extremities:  Without edema. Neurologic:  Alert and  oriented x4;  grossly normal neurologically. Psych:  Alert and cooperative. Normal mood and affect.  Intake/Output from previous day: 08/05 0701 - 08/06 0700 In: 1610 [P.O.:120; I.V.:1200; Blood:290] Out: 850 [Urine:850] Intake/Output this shift: Total I/O In: 120 [P.O.:120] Out: 1000 [Urine:1000]  Lab Results:  Recent Labs  01/05/14 1606 01/05/14 1711 01/06/14 0248  WBC 9.3  --  10.8*  HGB 10.5* 8.7* 7.8*  HCT 32.5* 26.7* 22.7*  PLT 256  --  170   BMET  Recent Labs  01/05/14 1606 01/06/14 0248  NA 139 140  K 3.6* 3.5*  CL 104 110  CO2 23 21  GLUCOSE 125* 138*  BUN 9 7  CREATININE 0.69 0.64  CALCIUM 8.6 7.1*   LFT  Recent Labs  01/05/14 1606  PROT 6.6  ALBUMIN 3.7  AST 22  ALT 18  ALKPHOS 58  BILITOT <0.2*   PT/INR  Recent Labs  01/05/14 1659  LABPROT 14.1  INR 1.09    Assessment / Plan: #1  63 yo female with acute major lower GI bleed post polypectomy of large cecal polyp yesterday. She was re-colonoscoped  last pm,  actively bleeding and ulcer site injected and 5 more clips placed-bleeding controlled ? rebleeding this am   Will transfer to step down Stat HGb after second unit-keep hgb 9  Start half of a moviprep in event colonoscopy needed later today to control bleeding #2 hx HTN #3 hx CAD -s/p stent #4  Chronic antiplatelet Rx with ASA and Plavix- plavix held 5 days before colon, continued Aspirin  Active Problems:   Lower GI bleed   Post-polypectomy bleeding     LOS: 1 day   [name]  01/06/2014, 8:47 AM

## 2014-01-06 NOTE — Progress Notes (Signed)
Called ICU to give report to "Ronalee Belts" -- he will have to return my call.

## 2014-01-06 NOTE — Progress Notes (Signed)
Writer gave report to "Ronalee Belts", ICU Stepdown nurse.

## 2014-01-06 NOTE — Care Management Note (Signed)
    Page 1 of 1   01/06/2014     10:41:54 AM CARE MANAGEMENT NOTE 01/06/2014  Patient:  [name], [name]   Account Number:  [id number]  Date Initiated:  01/06/2014  Documentation initiated by:  Dessa Phi  Subjective/Objective Assessment:   63 Y/O F ADMITTED W/LOWER GIB.     Action/Plan:   FROM HOME.   Anticipated DC Date:  01/10/2014   Anticipated DC Plan:  [place]  CM consult      Choice offered to / List presented to:             Status of service:  In process, will continue to follow Medicare Important Message given?   (If response is "NO", the following Medicare IM given date fields will be blank) Date Medicare IM given:   Medicare IM given by:   Date Additional Medicare IM given:   Additional Medicare IM given by:    Discharge Disposition:    Per UR Regulation:  Reviewed for med. necessity/level of care/duration of stay  If discussed at Long Length of Stay Meetings, dates discussed:    Comments:  01/06/14 Onetha Tiras Bianchini RN,BSN NCM [phone] NO ANTICIPATED D/C NEEDS.

## 2014-01-06 NOTE — Progress Notes (Signed)
Pt transferred to room 1222. Alert and oriented. Without c/o. Spouse at bedside.

## 2014-01-06 NOTE — Progress Notes (Signed)
Patient seen, examined, and I agree with the above documentation, including the assessment and plan. Unclear if patient is still bleeding, though stools continue to be dark red with bowel prep. Has required 3 units of pRBC thus far. I have recommended repeat colonoscopy this pm after partial bowel prep to investigate for any active bleeding and treat if possible Discussed again with the patient and her husband, including the risks and benefits, and they are agreeable to proceed.  If bleeding cannot be controlled endoscopically, then surgery would be the next option.  We discussed this together at bedside.

## 2014-01-07 ENCOUNTER — Encounter: Payer: Self-pay | Admitting: Internal Medicine

## 2014-01-07 ENCOUNTER — Encounter (HOSPITAL_COMMUNITY): Payer: Self-pay | Admitting: Internal Medicine

## 2014-01-07 LAB — HEMOGLOBIN AND HEMATOCRIT, BLOOD
HCT: 28.2 % — ABNORMAL LOW (ref 36.0–46.0)
HCT: 28.5 % — ABNORMAL LOW (ref 36.0–46.0)
HEMOGLOBIN: 9.6 g/dL — AB (ref 12.0–15.0)
Hemoglobin: 10.1 g/dL — ABNORMAL LOW (ref 12.0–15.0)

## 2014-01-07 MED ORDER — CETYLPYRIDINIUM CHLORIDE 0.05 % MT LIQD
7.0000 mL | Freq: Two times a day (BID) | OROMUCOSAL | Status: DC
  Administered 2014-01-07 (×2): 7 mL via OROMUCOSAL

## 2014-01-07 MED ORDER — CHLORHEXIDINE GLUCONATE 0.12 % MT SOLN
15.0000 mL | Freq: Two times a day (BID) | OROMUCOSAL | Status: DC
Start: 2014-01-07 — End: 2014-01-07
  Administered 2014-01-07: 15 mL via OROMUCOSAL
  Filled 2014-01-07: qty 15

## 2014-01-07 NOTE — Discharge Summary (Signed)
[facility] Discharge Summary  Name: DEJIA EBRON MRN: [id number] DOB: [birth year] 63 y.o. PCP:  Sheela Stack, MD  Date of Admission: 01/05/2014  3:42 PM Date of Discharge: 01/07/2014 Attending Physician: Jerene Bears, MD  Discharge Diagnosis: Active Problems:   Lower GI bleed   Post-polypectomy bleeding Anemia acute on chronic Hx CAD -s/p remote stent  Consultations:none Procedures Performed:  Dg Abd Acute W/chest  01/05/2014   CLINICAL DATA:  Polypectomy.  Evaluate for perforation.  EXAM: ACUTE ABDOMEN SERIES (ABDOMEN 2 VIEW & CHEST 1 VIEW)  COMPARISON:  Chest x-ray 10/06/2008.  FINDINGS: Mediastinum and hilar structures normal. Lungs are clear. Heart size normal. No pleural effusion or pneumothorax.  Soft tissue structures are unremarkable. Gas pattern is nonspecific. No free air. Surgical clips right abdomen. Calcifications in pelvis possibly related to a dermoid. No acute bony abnormality.  IMPRESSION: 1. No acute cardiopulmonary disease. 2. Abdominal series reveals no significant bowel distention or free air.   Electronically Signed   By: [place]   On: 01/05/2014 17:30    GI Procedures: Colonoscopy x 2 -Dr. Hilarie Fredrickson for control of bleeding  History/Physical Exam:  See Admission H&P  Admission HPI: [name] is a pleasant 63 year old female who had undergone outpatient colonoscopy with Dr. Henrene Pastor on 01/05/2014 for change in bowel habits. She was found to have several polyps 2 of which were greater than 2 cm and 1 in the a descending colon and a 3 cm polyp was removed from the cecum. These were both removed piecemeal. There was a defect after the removal of the cecal polyp which was closed with endoclips. No evidence for bleeding at the time of the procedure. However after returning home patient developed onset of maroon stool. She had a couple of episodes of grossly bloody stools at home and was excised come to the ER where she was admitted with an acute post  polypectomy bleed. Patient did have a baseline hemoglobin of 9.5-10. She had also been on aspirin and Plavix. Plavix was held for 5 days prior to the procedure but she was continued on a whole aspirin daily.  Hospital Course by problem list: Active Problems:   Lower GI bleed   Post-polypectomy bleeding  patient was admitted to the GI service and underwent urgent repeat colonoscopy on the evening of admission per Dr. Hilarie Fredrickson. There was a lot of blood in the colon. It was felt that she was bleeding from the cecal polypectomy site and this was injected and additional clips were placed. She did require 3 units of blood within the first 24 hours of admission. The following morning it appears she was still oozing. She received half of the bowel prep and then had repeat colonoscopy yesterday with Dr. Hilarie Fredrickson. At that time it was found that she had actually had the additional clips placed to be a descending colon polyp site and in addition ,had a visible vessel at the base of the cecal polypectomy site. This was treated with injection and Endo Clip with good hemostasis. Patient did not have any further evidence of bleeding. She has not required any further transfusions and her hemoglobin is now 10.1. She is is being discharged home this evening with instructions to take it easy over the next week. No heavy lifting. She will remain off of aspirin and Plavix for the next 3 weeks and also is advised not to take any anti-inflammatories. She will advance to a soft diet over the weekend and then regular diet thereafter. She is  advised to return to the hospital for obvious bleeding and/or to call or obvious office for any other concerns. She will be seen in followup in the office in 2 weeks and will repeat her hemoglobin and discuss interval followup depending on her path which is still pending.  Discharge Vitals:  BP 141/65  Pulse 76  Temp(Src) 98.7 F (37.1 C) (Oral)  Resp 13  Ht 5\' 3"  (1.6 m)  Wt 143 lb (64.864 kg)   BMI 25.34 kg/m2  SpO2 99%  Discharge Labs:  Results for orders placed during the hospital encounter of 01/05/14 (from the past 24 hour(s))  HEMOGLOBIN AND HEMATOCRIT, BLOOD     Status: Abnormal   Collection Time    01/06/14  5:33 PM      Result Value Ref Range   Hemoglobin 10.9 (*) 12.0 - 15.0 g/dL   HCT 31.3 (*) 36.0 - 46.0 %  HEMOGLOBIN AND HEMATOCRIT, BLOOD     Status: Abnormal   Collection Time    01/07/14  2:54 AM      Result Value Ref Range   Hemoglobin 9.6 (*) 12.0 - 15.0 g/dL   HCT 28.2 (*) 36.0 - 46.0 %  HEMOGLOBIN AND HEMATOCRIT, BLOOD     Status: Abnormal   Collection Time    01/07/14 12:32 PM      Result Value Ref Range   Hemoglobin 10.1 (*) 12.0 - 15.0 g/dL   HCT 28.5 (*) 36.0 - 46.0 %    Disposition and follow-up:   Ms.Markeisha J Kretz was discharged from [facility] in stable condition.    Follow-up Appointments: Amy Esterwood PA-C / [facility] 8/24 at 1;30 pm   Discharge Medications:   Medication List    STOP taking these medications       aspirin 325 MG tablet     clopidogrel 75 MG tablet  Commonly known as:  PLAVIX      TAKE these medications       carvedilol 6.25 MG tablet  Commonly known as:  COREG  Take 0.5 tablets (3.125 mg total) by mouth 2 (two) times daily with a meal.     ferrous sulfate 325 (65 FE) MG tablet  Take 1 tablet (325 mg total) by mouth 2 (two) times daily.     losartan-hydrochlorothiazide 50-12.5 MG per tablet  Commonly known as:  HYZAAR  Take 1 tablet by mouth daily.     omeprazole 20 MG capsule  Commonly known as:  PRILOSEC  Take 1 capsule (20 mg total) by mouth daily.     simvastatin 40 MG tablet  Commonly known as:  ZOCOR  Take 40 mg by mouth at bedtime.        Signed: Nicoletta Ba 01/07/2014, 4:07 PM

## 2014-01-07 NOTE — Progress Notes (Signed)
Patient ID: [name], female   DOB: 1951-03-30, 63 y.o.   MRN: [id number]  [facility] Progress Note  Subjective: Feeling better.  No abdominal pain, no Bm's since procedure yesterday Hgb 9.6 stable Objective:  Vital signs in last 24 hours: Temp:  [98.1 F (36.7 C)-98.9 F (37.2 C)] 98.4 F (36.9 C) (08/07 0745) Pulse Rate:  [61-89] 65 (08/07 0700) Resp:  [9-24] 12 (08/07 0700) BP: (98-164)/(35-79) 122/58 mmHg (08/07 0700) SpO2:  [94 %-100 %] 99 % (08/07 0700) Weight:  [143 lb (64.864 kg)] 143 lb (64.864 kg) (08/07 0400) Last BM Date: 01/06/14 General:   Alert,  Well-developed,WF    in NAD Heart:  Regular rate and rhythm; no murmurs Pulm;clear Abdomen:  Soft, nontender and nondistended. Normal bowel sounds, without guarding, and without rebound.   Extremities:  Without edema. Neurologic:  Alert and  oriented x4;  grossly normal neurologically. Psych:  Alert and cooperative. Normal mood and affect.  Intake/Output from previous day: 08/06 0701 - 08/07 0700 In: 815 [P.O.:240; I.V.:250; Blood:325] Out: 2750 [Urine:2350; Stool:400] Intake/Output this shift: Total I/O In: 600 [P.O.:600] Out: 650 [Urine:650]  Lab Results:  Recent Labs  01/05/14 1606  01/06/14 0248 01/06/14 1000 01/06/14 1733 01/07/14 0254  WBC 9.3  --  10.8*  --   --   --   HGB 10.5*  < > 7.8* 8.9* 10.9* 9.6*  HCT 32.5*  < > 22.7* 25.9* 31.3* 28.2*  PLT 256  --  170  --   --   --   < > = values in this interval not displayed. BMET  Recent Labs  01/05/14 1606 01/06/14 0248  NA 139 140  K 3.6* 3.5*  CL 104 110  CO2 23 21  GLUCOSE 125* 138*  BUN 9 7  CREATININE 0.69 0.64  CALCIUM 8.6 7.1*   LFT  Recent Labs  01/05/14 1606  PROT 6.6  ALBUMIN 3.7  AST 22  ALT 18  ALKPHOS 58  BILITOT <0.2*   PT/INR  Recent Labs  01/05/14 1659  LABPROT 14.1  INR 1.09    Assessment / Plan: #1 63 yo female with acute major post polypectomy bleed. S/p second repeat  Colonoscopy  yesterday with clipping of VV  At cecal polypectomy site No active bleeding since last procedure-hgb stable Will advance to full liquids Possible discharge later this afternoon- repeat hgb early afternoon Pt will stay off ASA and Plavix x 2 weeks Active Problems:   Lower GI bleed   Post-polypectomy bleeding     LOS: 2 days   Briceida Rasberry  01/07/2014, 8:48 AM

## 2014-01-07 NOTE — Progress Notes (Signed)
Reinforced discharged education.  Patient and spouse verbalized understanding.

## 2014-01-07 NOTE — Discharge Summary (Signed)
Patient seen, examined, and I agree with the above documentation, including the assessment and plan. Office follow-up with Dr. Henrene Pastor. Continue to hold Plavix for at least 2 weeks.

## 2014-01-07 NOTE — Progress Notes (Signed)
Patient seen, examined, and I agree with the above documentation, including the assessment and plan.  

## 2014-01-07 NOTE — Discharge Instructions (Signed)
NO aspirin,,advil, aleve x 3 weeks- tylenol is Ok Restart Plavix in 3 weeks Call Dr. Blanch Media office for any problems with recurrent bleeding!  [phone]

## 2014-01-09 LAB — TYPE AND SCREEN
ABO/RH(D): A POS
Antibody Screen: NEGATIVE
UNIT DIVISION: 0
UNIT DIVISION: 0
Unit division: 0
Unit division: 0
Unit division: 0

## 2014-01-10 NOTE — Progress Notes (Signed)
CARE MANAGE MENT UTILIZATION REVIEW NOTE 01/10/2014     Patient:  [name], [name]   Account Number:  [id number]  Documented by:  Velva Harman, RN,BSN,CCM  Per Ur Regulation Reviewed for med. necessity/level of care/duration of stay Patient discharge to home with family.

## 2014-01-21 ENCOUNTER — Encounter: Payer: Self-pay | Admitting: *Deleted

## 2014-01-24 ENCOUNTER — Ambulatory Visit (INDEPENDENT_AMBULATORY_CARE_PROVIDER_SITE_OTHER): Payer: BC Managed Care – PPO | Admitting: Physician Assistant

## 2014-01-24 ENCOUNTER — Encounter: Payer: Self-pay | Admitting: Physician Assistant

## 2014-01-24 ENCOUNTER — Other Ambulatory Visit (INDEPENDENT_AMBULATORY_CARE_PROVIDER_SITE_OTHER): Payer: BC Managed Care – PPO

## 2014-01-24 VITALS — BP 130/78 | HR 66 | Ht 63.0 in | Wt 141.6 lb

## 2014-01-24 DIAGNOSIS — D369 Benign neoplasm, unspecified site: Secondary | ICD-10-CM

## 2014-01-24 DIAGNOSIS — IMO0002 Reserved for concepts with insufficient information to code with codable children: Secondary | ICD-10-CM

## 2014-01-24 DIAGNOSIS — D509 Iron deficiency anemia, unspecified: Secondary | ICD-10-CM

## 2014-01-24 DIAGNOSIS — Z86010 Personal history of colonic polyps: Secondary | ICD-10-CM

## 2014-01-24 LAB — CBC WITH DIFFERENTIAL/PLATELET
Basophils Absolute: 0.1 10*3/uL (ref 0.0–0.1)
Basophils Relative: 0.7 % (ref 0.0–3.0)
Eosinophils Absolute: 0.2 10*3/uL (ref 0.0–0.7)
Eosinophils Relative: 2.4 % (ref 0.0–5.0)
HCT: 31.8 % — ABNORMAL LOW (ref 36.0–46.0)
Hemoglobin: 10.7 g/dL — ABNORMAL LOW (ref 12.0–15.0)
Lymphocytes Relative: 25.5 % (ref 12.0–46.0)
Lymphs Abs: 1.9 10*3/uL (ref 0.7–4.0)
MCHC: 33.5 g/dL (ref 30.0–36.0)
MCV: 86 fl (ref 78.0–100.0)
MONOS PCT: 8.9 % (ref 3.0–12.0)
Monocytes Absolute: 0.7 10*3/uL (ref 0.1–1.0)
NEUTROS PCT: 62.5 % (ref 43.0–77.0)
Neutro Abs: 4.6 10*3/uL (ref 1.4–7.7)
PLATELETS: 356 10*3/uL (ref 150.0–400.0)
RBC: 3.7 Mil/uL — ABNORMAL LOW (ref 3.87–5.11)
RDW: 17.4 % — AB (ref 11.5–15.5)
WBC: 7.4 10*3/uL (ref 4.0–10.5)

## 2014-01-24 LAB — IBC PANEL
IRON: 23 ug/dL — AB (ref 42–145)
SATURATION RATIOS: 5.8 % — AB (ref 20.0–50.0)
TRANSFERRIN: 285.3 mg/dL (ref 212.0–360.0)

## 2014-01-24 LAB — FERRITIN: FERRITIN: 3.9 ng/mL — AB (ref 10.0–291.0)

## 2014-01-24 NOTE — Patient Instructions (Signed)
Please go to the basement level to have your labs drawn.  

## 2014-01-24 NOTE — Progress Notes (Signed)
Subjective:    Patient ID: [name], female    DOB: Sep 11, 1950, 63 y.o.   MRN: [id number]  HPI [name] is a pleasant 63 year old white female known to Dr. Henrene Pastor who had undergone colonoscopy on 01/05/2014 for change in bowel habits and and iron deficiency anemia. She was found to have several polyps. One of these was a greater than 3 cm polyp of the cecum which was removed piecemeal there was a defect noted after the removal of the cecal polyp and this was closed  with endoclips. She had no evidence for bleeding at the time of the procedure however after returning home she had onset of maroon stool. She required admission to the hospital where she continued to have active bleeding. She had been on aspirin and Plavix preprocedure. The Plavix have been held for 5 days but she had continued on a whole aspirin daily. She underwent urgent repeat colonoscopy on the evening of admission with injection and additional Endo Clipping  at the cecal polypectomy site. She required 3 units of blood within the first 24 hours of admission. The following morning she appeared to still be oozing and had a bowel prep and repeat colonoscopy with finding that the additional clips had actually been placed  On the ascending colon polyp site and in addition she had a visible vessel at the base of the cecal polypectomy site which was injected and Endo Clipped. She did well thereafter had no further evidence of active bleeding. She was discharged home with hemoglobin of 10.1, and asked to remain off of aspirin and Plavix for the next 3 weeks. Path on her polyps is all returned consistent with tubular adenomas. She says she is feeling better remains fatigued but has no complaints of abdominal discomfort and no evidence of any GI bleeding since hospitalization. She does ask about whether she will need to continue on iron supplements long-term as they're very constipating.      Review of Systems  Constitutional: Positive for  fatigue.  HENT: Negative.   Eyes: Negative.   Respiratory: Negative.   Cardiovascular: Negative.   Gastrointestinal: Negative.   Endocrine: Negative.   Genitourinary: Negative.   Musculoskeletal: Negative.   Allergic/Immunologic: Negative.   Neurological: Negative.   Hematological: Negative.   Psychiatric/Behavioral: Negative.    Outpatient Prescriptions Prior to Visit  Medication Sig Dispense Refill  . carvedilol (COREG) 6.25 MG tablet Take 0.5 tablets (3.125 mg total) by mouth 2 (two) times daily with a meal.  60 tablet  11  . ferrous sulfate 325 (65 FE) MG tablet Take 1 tablet (325 mg total) by mouth 2 (two) times daily.  60 tablet  6  . losartan-hydrochlorothiazide (HYZAAR) 50-12.5 MG per tablet Take 1 tablet by mouth daily.  30 tablet  11  . omeprazole (PRILOSEC) 20 MG capsule Take 1 capsule (20 mg total) by mouth daily.  30 capsule  11  . simvastatin (ZOCOR) 40 MG tablet Take 40 mg by mouth at bedtime.       No facility-administered medications prior to visit.      No Known Allergies Patient Active Problem List   Diagnosis Date Noted  . Multiple adenomatous polyps 01/24/2014  . Lower GI bleed 01/05/2014  . Post-polypectomy bleeding 01/05/2014  . NONSPECIFIC ABN FINDNG RAD&OTH EXAM BILARY TRCT 11/09/2008  . Nonspecific (abnormal) findings on radiological and other examination of body structure 11/01/2008  . CT, CHEST, ABNORMAL 11/01/2008  . ABDOMINAL AORTIC ANEURYSM 10/26/2008  . HYPERLIPIDEMIA 10/25/2008  .  TOBACCO ABUSE 10/25/2008  . HYPERTENSION 10/25/2008  . CAD 10/25/2008  . ISCHEMIC CARDIOMYOPATHY 10/25/2008  . CONGESTIVE HEART FAILURE, SYSTOLIC, CHRONIC 29/93/7169   History  Substance Use Topics  . Smoking status: Former Smoker    Types: Cigarettes    Quit date: 06/26/2012  . Smokeless tobacco: Never Used  . Alcohol Use: 1.2 oz/week    2 Cans of beer per week     Comment: rare   family history includes Colon polyps in her father; Diabetes in her  maternal grandmother; Lymphoma in her mother. There is no history of Colon cancer.  Objective:   Physical Exam  female in no acute distress, pleasant blood pressure 130/78 pulse 66 height 5 foot 3 weight 141. HEENT; nontraumatic normocephalic EOMI PERRLA sclera anicteric, Supple; no JVD, Cardiovascular ;regular rate and rhythm with S1-S2 no murmur or gallop, Pulmonary; clear bilaterally, Abdomen ;soft nontender nondistended bowel sounds are active no palpable mass or hepatosplenomegaly, Rectal; exam not done , Ext; no clubbing cyanosis or edema skin warm and dry, Psych ;mood and affect appropriate      Assessment & Plan: 19   #65 63 year old female seen in post hospital followup after an acute post polypectomy hemorrhage from a large cecal polypectomy site. Patient has had no active bleeding since hospitalization #2 multiple adenomatous colon polyps #3 chronic antiplatelet therapy with aspirin and Plavix #4 coronary artery disease-remote stent #5 ischemic cardiomyopathy with CHF #6 iron deficiency anemia probably secondary to #1  Plan; Repeat CBC today and will also repeat iron studies which had last been done in March. Discussion with the patient regarding need for followup colonoscopy in 4-6 months to assess for any residual polyp at the cecal polypectomy site. She is not anxious to have followup colonoscopy given recent complication but is agreeable, and will plan to call back in November or December to schedule procedure She is to remain off of aspirin and Plavix for 3 weeks total, and may then resume.  Plan

## 2014-01-25 ENCOUNTER — Other Ambulatory Visit: Payer: Self-pay

## 2014-01-25 ENCOUNTER — Telehealth: Payer: Self-pay

## 2014-01-25 MED ORDER — INTEGRA PLUS PO CAPS: ORAL_CAPSULE | ORAL | Status: DC

## 2014-01-25 NOTE — Telephone Encounter (Signed)
Message copied by Greggory Keen on Tue Jan 25, 2014  3:57 PM ------      Message from: Nicoletta Ba S      Created: Tue Jan 25, 2014  3:09 PM       Please let pt know her hgb  is 10.7  Which is better- she is still fe deficient- lets have her switch to Integra plus one daily for 3 months then repeat iron studies- she has been in a generic iron which is constipating her and she doesn't appear to be absorbing well ------

## 2014-01-25 NOTE — Progress Notes (Signed)
Agree with assessment and plan. Will need relook colonoscopy in 6 months. Discussed with patient and husband

## 2014-01-25 NOTE — Telephone Encounter (Signed)
Husband calls back. Results and plan discussed with him.

## 2014-03-03 ENCOUNTER — Telehealth: Payer: Self-pay | Admitting: Cardiovascular Disease

## 2014-03-03 MED ORDER — CARVEDILOL 6.25 MG PO TABS: 6.2500 mg | ORAL_TABLET | Freq: Two times a day (BID) | ORAL | Status: DC

## 2014-03-03 NOTE — Telephone Encounter (Signed)
NEW  SCRIPT  SENT VIA  EPIC FOR   CARVEDILOL 6.25 MG  BID  .Adonis Housekeeper

## 2014-03-03 NOTE — Telephone Encounter (Signed)
Medication question.   On her Carvedilol bottle it states to take 1/2 a pill by mouth 2 times daily this is 3.125 mg. She also brought in an old bottle that had instructions to take 1 pill by mouth 2 times daily at 6.25 mg. She believes the pharmacy made a mistake with her medication, please advise.

## 2014-03-03 NOTE — Addendum Note (Signed)
Addended by: Devra Dopp E on: 03/03/2014 12:03 PM   Modules accepted: Orders

## 2014-03-04 ENCOUNTER — Encounter: Payer: Self-pay | Admitting: Internal Medicine

## 2014-04-20 ENCOUNTER — Telehealth: Payer: Self-pay | Admitting: *Deleted

## 2014-04-20 ENCOUNTER — Ambulatory Visit (INDEPENDENT_AMBULATORY_CARE_PROVIDER_SITE_OTHER): Payer: BC Managed Care – PPO | Admitting: Gastroenterology

## 2014-04-20 ENCOUNTER — Encounter: Payer: Self-pay | Admitting: Gastroenterology

## 2014-04-20 VITALS — BP 144/80 | HR 64 | Ht 63.5 in | Wt 142.4 lb

## 2014-04-20 DIAGNOSIS — Z86010 Personal history of colonic polyps: Secondary | ICD-10-CM

## 2014-04-20 DIAGNOSIS — Z7901 Long term (current) use of anticoagulants: Secondary | ICD-10-CM

## 2014-04-20 MED ORDER — MOVIPREP 100 G PO SOLR: 1.0000 | Freq: Once | ORAL | Status: DC

## 2014-04-20 NOTE — Telephone Encounter (Signed)
04/20/2014   RE: [name] DOB: 22-Jul-1950 MRN: [id number]   Dear Dr. Johnsie Cancel,    We have scheduled the above patient for an Colonoscopy. Our records show that she is on anticoagulation therapy.   Please advise as to how long the patient may come off her therapy of Plavix prior to the procedure, which is scheduled for 06-07-2014.  Please route the completed form to Evette Georges., CMA  Sincerely,    Hope Pigeon

## 2014-04-20 NOTE — Patient Instructions (Signed)
You have been scheduled for a colonoscopy. Please follow written instructions given to you at your visit today.  Please pick up your prep kit at the pharmacy within the next 1-3 days. If you use inhalers (even only as needed), please bring them with you on the day of your procedure. Your physician has requested that you go to www.startemmi.com and enter the access code given to you at your visit today. This web site gives a general overview about your procedure. However, you should still follow specific instructions given to you by our office regarding your preparation for the procedure.  You will be contacted by our office prior to your procedure for directions on holding your Plavix.  If you do not hear from our office 1 week prior to your scheduled procedure, please call [phone] to discuss.   

## 2014-04-20 NOTE — Progress Notes (Signed)
Agree with initial assessment and plans as outlined 

## 2014-04-20 NOTE — Progress Notes (Addendum)
     04/20/2014 [name] [id number] 11-02-1950   History of Present Illness:  This is a 63 year old female who is known to Dr. Henrene Pastor.  She had a colonoscopy in 01/2014 at which time she had large sessile cecal (30 mm) and ascending colon polyps (25 mm) removed piecemeal with snare cautery.  Pathology showed multiple fragments of tubular adenomas and repeat was recommended in 4 months from that time.  There were also multiple small polyps removed with cold snare, some of which were also tubular adenomas.  Just of note, she did end up hospitalized the same day with post-polypectomy bleeding and underwent a repeat colonoscopy the same day and another the following day for hemostasis; had multiple endo-clips placed.  Required 3 units PRBC's at that time.  Has been doing well since that time.  No further bleeding.  She is here today to schedule repeat colonoscopy.  She is on Plaviix that is prescribed by Dr. Johnsie Cancel for CAD and previous stent placement.   Current Medications, Allergies, Past Medical History, Past Surgical History, Family History and Social History were reviewed in [employer] record.   Physical Exam: BP 144/80 mmHg  Pulse 64  Ht 5' 3.5" (1.613 m)  Wt 142 lb 6 oz (64.581 kg)  BMI 24.82 kg/m2 General: Well developed white female in no acute distress Head: Normocephalic and atraumatic Eyes:  Sclerae anicteric, conjunctiva pink  Ears: Normal auditory acuity Lungs: Clear throughout to auscultation Heart: Regular rate and rhythm Abdomen: Soft, non-distended.  Normal bowel sounds.  Non-tender. Rectal:  Will be done at the time of colonoscopy. Musculoskeletal: Symmetrical with no gross deformities  Extremities: No edema  Neurological: Alert oriented x 4, grossly non-focal Psychological:  Alert and cooperative. Normal mood and affect  Assessment and Recommendations: -History of large colon polyp:  30 mm TA in 01/2014 with repeat colonoscopy  recommended in 4 months from that time.  Will schedule with Dr. Henrene Pastor.  The risks, benefits, and alternatives were discussed with the patient and she consents to proceed. -Chronic anti-coagulation/anti-platelet therapy:  The risks benefits and alternatives to a temporary hold of anti-coagulants/anti-platelets for the procedure were discussed with the patient she consents to proceed. Obtain clearance from Dr. Johnsie Cancel.

## 2014-04-21 NOTE — Telephone Encounter (Signed)
Patient notified to hold Plavix 5 days prior to procedure Patient verbalized understanding   

## 2014-04-21 NOTE — Telephone Encounter (Signed)
5 days before is fine

## 2014-04-21 NOTE — Telephone Encounter (Signed)
Dr. Johnsie Cancel,  It is our normal protocol to hold Plavix 5 to 7 days prior to procedure How many days prior to procedure will patient need to hold Plavix?

## 2014-05-01 ENCOUNTER — Other Ambulatory Visit: Payer: Self-pay | Admitting: Physician Assistant

## 2014-05-02 ENCOUNTER — Encounter: Payer: Self-pay | Admitting: Internal Medicine

## 2014-05-20 ENCOUNTER — Other Ambulatory Visit: Payer: Self-pay | Admitting: *Deleted

## 2014-05-20 MED ORDER — CLOPIDOGREL BISULFATE 75 MG PO TABS: 75.0000 mg | ORAL_TABLET | Freq: Every day | ORAL | Status: DC

## 2014-06-07 ENCOUNTER — Encounter: Payer: Self-pay | Admitting: Internal Medicine

## 2014-06-07 ENCOUNTER — Ambulatory Visit (AMBULATORY_SURGERY_CENTER): Payer: 59 | Admitting: Internal Medicine

## 2014-06-07 VITALS — BP 130/79 | HR 70 | Temp 97.9°F | Resp 28 | Ht 63.0 in | Wt 142.0 lb

## 2014-06-07 DIAGNOSIS — Z86010 Personal history of colonic polyps: Secondary | ICD-10-CM

## 2014-06-07 DIAGNOSIS — R198 Other specified symptoms and signs involving the digestive system and abdomen: Secondary | ICD-10-CM

## 2014-06-07 DIAGNOSIS — D120 Benign neoplasm of cecum: Secondary | ICD-10-CM

## 2014-06-07 DIAGNOSIS — K635 Polyp of colon: Secondary | ICD-10-CM

## 2014-06-07 DIAGNOSIS — D122 Benign neoplasm of ascending colon: Secondary | ICD-10-CM

## 2014-06-07 MED ORDER — SODIUM CHLORIDE 0.9 % IV SOLN: 500.0000 mL | INTRAVENOUS | Status: DC

## 2014-06-07 NOTE — Progress Notes (Signed)
Pt. Advised to check with Dr. Johnsie Cancel re: restarting Plavix.

## 2014-06-07 NOTE — Op Note (Signed)
[facility]  [employer] [place], [address]   COLONOSCOPY PROCEDURE REPORT  PATIENT: [name], [name]  MR#: [id number] BIRTHDATE: June 05, 1950 , 89  yrs. old GENDER: female ENDOSCOPIST: Eustace Quail, MD REFERRED [id number] Program Recall PROCEDURE DATE:  06/07/2014 PROCEDURE:   Colonoscopy with snare polypectomy x 1and Colonoscopy with biopsies First Screening Colonoscopy - Avg.  risk and is 50 yrs.  old or older - No.  Prior Negative Screening - Now for repeat screening. N/A  History of Adenoma - Now for follow-up colonoscopy & has been > or = to 3 yrs.  No.  It has been less than 3 yrs since last colonoscopy.  Medical reason.  Polyps Removed Today? Yes. ASA CLASS:   Class III INDICATIONS:surveillance colonoscopy based on a history of adenomatous colonic polyp(s). Index colonoscopy August 2015 with multiple polyps including several large sessile lesions in the right colon removed piecemeal. Note: The patient had post polypectomy bleed requiring endoscopic intervention. Now for surveillance. Reports that she has been off Plavix greater than one month. MEDICATIONS: Monitored anesthesia care and Propofol 300 mg IV DESCRIPTION OF PROCEDURE:   After the risks benefits and alternatives of the procedure were thoroughly explained, informed consent was obtained.  The digital rectal exam revealed no abnormalities of the rectum.   The [id number]  endoscope was introduced through the anus and advanced to the cecum, which was identified by both the appendix and ileocecal valve. No adverse events experienced.   The quality of the prep was excellent, using MoviPrep  The instrument was then slowly withdrawn as the colon was fully examined.  COLON FINDINGS: The colonoscope was advanced to the cecum.  There was obvious scarring from prior polypectomy in both the cecum and ascending colon.  No obvious residual adenoma, though biopsies were obtained from each  area.  A diminutive ascending colon polyp was removed with cold snare, additionally.  This was retrieved and submitted.  There was an incidental cecal AVM (nonbleeding) as well as an incidental ascending colon lipoma measuring approximately 2 cm.  The left colon revealed mild diverticulosis.  The examination was otherwise normal.  Retroflexed views revealed internal hemorrhoids. The time to cecum=4 minutes 19 seconds.  Withdrawal time=18 minutes 31 seconds.  The scope was withdrawn and the procedure completed. COMPLICATIONS: There were no immediate complications. ENDOSCOPIC IMPRESSION: 1. Prior scar from large polypectomies without obvious residual neoplasia, status post biopsies 2. Diminutive right colon polyp status post polypectomy 3. Otherwise normal colonoscopy with incidental findings as noted RECOMMENDATIONS: 1.  Await pathology results 2.  Repeat Colonoscopy in 1 year.  eSigned:  Eustace Quail, MD 06/07/2014 2:50 PM   cc: Reynold Bowen, MD and The Patient

## 2014-06-07 NOTE — Progress Notes (Signed)
Report to PACU, RN, vss, BBS= Clear.  

## 2014-06-07 NOTE — Patient Instructions (Signed)
YOU HAD AN ENDOSCOPIC PROCEDURE TODAY AT THE [facility]: Refer to the procedure report that was given to you for any specific questions about what was found during the examination.  If the procedure report does not answer your questions, please call your gastroenterologist to clarify.  If you requested that your care partner not be given the details of your procedure findings, then the procedure report has been included in a sealed envelope for you to review at your convenience later.  YOU SHOULD EXPECT: Some feelings of bloating in the abdomen. Passage of more gas than usual.  Walking can help get rid of the air that was put into your GI tract during the procedure and reduce the bloating. If you had a lower endoscopy (such as a colonoscopy or flexible sigmoidoscopy) you may notice spotting of blood in your stool or on the toilet paper. If you underwent a bowel prep for your procedure, then you may not have a normal bowel movement for a few days.  DIET: Your first meal following the procedure should be a light meal and then it is ok to progress to your normal diet.  A half-sandwich or bowl of soup is an example of a good first meal.  Heavy or fried foods are harder to digest and may make you feel nauseous or bloated.  Likewise meals heavy in dairy and vegetables can cause extra gas to form and this can also increase the bloating.  Drink plenty of fluids but you should avoid alcoholic beverages for 24 hours.  ACTIVITY: Your care partner should take you home directly after the procedure.  You should plan to take it easy, moving slowly for the rest of the day.  You can resume normal activity the day after the procedure however you should NOT DRIVE or use heavy machinery for 24 hours (because of the sedation medicines used during the test).    SYMPTOMS TO REPORT IMMEDIATELY: A gastroenterologist can be reached at any hour.  During normal business hours, 8:30 AM to 5:00 PM Monday through Friday,  call [phone].  After hours and on weekends, please call the GI answering service at [phone] who will take a message and have the physician on call contact you.   Following lower endoscopy (colonoscopy or flexible sigmoidoscopy):  Excessive amounts of blood in the stool  Significant tenderness or worsening of abdominal pains  Swelling of the abdomen that is new, acute  Fever of 100F or higher  FOLLOW UP: If any biopsies were taken you will be contacted by phone or by letter within the next 1-3 weeks.  Call your gastroenterologist if you have not heard about the biopsies in 3 weeks.  Our staff will call the home number listed on your records the next business day following your procedure to check on you and address any questions or concerns that you may have at that time regarding the information given to you following your procedure. This is a courtesy call and so if there is no answer at the home number and we have not heard from you through the emergency physician on call, we will assume that you have returned to your regular daily activities without incident.  SIGNATURES/CONFIDENTIALITY: You and/or your care partner have signed paperwork which will be entered into your electronic medical record.  These signatures attest to the fact that that the information above on your After Visit Summary has been reviewed and is understood.  Full responsibility of the confidentiality of this   discharge information lies with you and/or your care-partner.  Polyp information given.    Repeat colonoscopy in one year.

## 2014-06-07 NOTE — Progress Notes (Signed)
Called to room to assist during endoscopic procedure.  Patient ID and intended procedure confirmed with present staff. Received instructions for my participation in the procedure from the performing physician.  

## 2014-06-08 ENCOUNTER — Telehealth: Payer: Self-pay | Admitting: *Deleted

## 2014-06-08 NOTE — Telephone Encounter (Signed)
  Follow up Call-  Call back number 06/07/2014 01/05/2014  Post procedure Call Back phone  # [phone]  Permission to leave phone message Yes Yes     Patient questions:  Do you have a fever, pain , or abdominal swelling? No. Pain Score  0 *  Have you tolerated food without any problems? Yes.    Have you been able to return to your normal activities? Yes.    Do you have any questions about your discharge instructions: Diet   No. Medications  No. Follow up visit  No.  Do you have questions or concerns about your Care? No.  Actions: * If pain score is 4 or above: No action needed, pain <4.

## 2014-06-13 ENCOUNTER — Encounter: Payer: Self-pay | Admitting: Internal Medicine

## 2014-06-13 NOTE — Progress Notes (Signed)
Patient ID: [name], female   DOB: 07-18-1950, 64 y.o.   MRN: [id number] [name] is seen today in followup for coronary artery disease. She has a distant history of anterior wall MI 1998. She had a bare-metal stent. She had reintervention in 2008. 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 She was hospitalized in May of 2013 for chest pain. She had no limiting lesions of coronaries. She has a followup CT scan which showed ectasia of the aorta but no significant aneurysm. However there were abnormalities in her liver that need further workup. I reviewed notes from Dr. Owens Loffler. He is referring her to [facility] for possible ERCP. She has dilated distal bile duct. She was also inquiring about having a less expensive drug and Lipitor for her cholesterol. Since her LDL has been under 100 the past I think is reasonable to switch her to [place] 40 of Zocor. She has not had any significant jaundice biliary colic nausea vomiting or diarrhea. She has not had any recurrent chest pain PND orthopnea palpitations or syncope.   Quit smoking last year Husband had DCM and sees Bensimohn Stopped her coreg because she felt weak  Restarted at lower dose and tolerating BP still too high   Echo 08/16/13 Normal EF  Study Conclusions  - Left ventricle: The cavity size was normal. Wall thickness was normal. Systolic function was normal. The estimated ejection fraction was in the range of 55% to 60%. Wall motion was normal; there were no regional wall motion abnormalities. Doppler parameters are consistent with abnormal left ventricular relaxation (grade 1 diastolic dysfunction). The E/e' ratio is <10, suggesting normal LV filling pressure. - Mitral valve: Calcified annulus. Mild regurgitation. - Left atrium: The atrium was at the upper limits of normal in size. - Tricuspid valve: Mild regurgitation. - Pulmonary arteries: PA peak pressure: 27mm Hg (S). - Inferior vena cava: The vessel was normal  in size; the respirophasic diameter changes were in the normal range (= 50%); findings are consistent with normal central venous pressure.   Last visit added hyzaar for BP and this caused dizzyness. She is seeing Dr Henrene Pastor for anemia   Had bleeding polyp removed   Husband [name] has renal cell carcinoma  And had left nephrectomy    ROS: Denies fever, malais, weight loss, blurry vision, decreased visual acuity, cough, sputum, SOB, hemoptysis, pleuritic pain, palpitaitons, heartburn, abdominal pain, melena, lower extremity edema, claudication, or rash.  All other systems reviewed and negative  General: Affect appropriate Healthy:  appears stated age 34: normal Neck supple with no adenopathy JVP normal no bruits no thyromegaly Lungs clear with no wheezing and good diaphragmatic motion Heart:  S1/S2 no murmur, no rub, gallop or click PMI normal Abdomen: benighn, BS positve, no tenderness, no AAA no bruit.  No HSM or HJR Distal pulses intact with no bruits No edema Neuro non-focal Skin warm and dry No muscular weakness   Current Outpatient Prescriptions  Medication Sig Dispense Refill  . carvedilol (COREG) 6.25 MG tablet Take 1 tablet (6.25 mg total) by mouth 2 (two) times daily with a meal. 60 tablet 11  . clopidogrel (PLAVIX) 75 MG tablet Take 1 tablet (75 mg total) by mouth daily. 30 tablet 2  . FeFum-FePoly-FA-B Cmp-C-Biot (INTEGRA PLUS) CAPS Take one tablet every day 30 capsule 2  . iron polysaccharides (NIFEREX) 150 MG capsule Take 150 mg by mouth daily.    Marland Kitchen losartan-hydrochlorothiazide (HYZAAR) 50-12.5 MG per tablet Take 1 tablet  by mouth daily. 30 tablet 11  . omeprazole (PRILOSEC) 20 MG capsule Take 1 capsule (20 mg total) by mouth daily. 30 capsule 11  . simvastatin (ZOCOR) 40 MG tablet Take 40 mg by mouth at bedtime.     No current facility-administered medications for this visit.    Allergies  Review of patient's allergies indicates no known  allergies.  Electrocardiogram: SR 89  ICRBBB 10/20/13 06/14/14  No change SR 79 ICRBBB   Assessment and Plan

## 2014-06-14 ENCOUNTER — Encounter: Payer: Self-pay | Admitting: Cardiovascular Disease

## 2014-06-14 ENCOUNTER — Ambulatory Visit (INDEPENDENT_AMBULATORY_CARE_PROVIDER_SITE_OTHER): Payer: 59 | Admitting: Cardiovascular Disease

## 2014-06-14 VITALS — BP 140/90 | HR 79 | Ht 63.0 in | Wt 144.8 lb

## 2014-06-14 DIAGNOSIS — Z7901 Long term (current) use of anticoagulants: Secondary | ICD-10-CM

## 2014-06-14 DIAGNOSIS — I10 Essential (primary) hypertension: Secondary | ICD-10-CM

## 2014-06-14 DIAGNOSIS — E785 Hyperlipidemia, unspecified: Secondary | ICD-10-CM

## 2014-06-14 MED ORDER — CLOPIDOGREL BISULFATE 75 MG PO TABS: 75.0000 mg | ORAL_TABLET | Freq: Every day | ORAL | Status: DC

## 2014-06-14 NOTE — Assessment & Plan Note (Signed)
Stable with no angina and good activity level.  Continue medical Rx  

## 2014-06-14 NOTE — Assessment & Plan Note (Signed)
BP runs fine at home  She takes it 2x/week continue current regiman and low sodium diet

## 2014-06-14 NOTE — Patient Instructions (Signed)
Your physician wants you to follow-up in:  6 MONTHS WITH DR NISHAN  You will receive a reminder letter in the mail two months in advance. If you don't receive a letter, please call our office to schedule the follow-up appointment. Your physician recommends that you continue on your current medications as directed. Please refer to the Current Medication list given to you today. 

## 2014-06-14 NOTE — Assessment & Plan Note (Signed)
Cholesterol is at goal.  Continue current dose of statin and diet Rx.  No myalgias or side effects.  F/U  LFT's in 6 months. Lab Results  Component Value Date   LDLCALC 80 07/28/2013

## 2014-09-06 ENCOUNTER — Other Ambulatory Visit: Payer: Self-pay | Admitting: Cardiovascular Disease

## 2015-01-01 NOTE — Progress Notes (Signed)
Patient ID: [name], female   DOB: 12-15-1950, 64 y.o.   MRN: [id number] [name] is seen today in followup for coronary artery disease. She has a distant history of anterior wall MI 1998. She had a bare-metal stent. She had reintervention in 2008. 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   Quit smoking   Stopped her coreg because she felt weak  Restarted at lower dose and tolerating Hyzaar caused some dizzyness  BP running fine at home records reviewed   Echo 08/16/13 Normal EF  Study Conclusions  - Left ventricle: The cavity size was normal. Wall thickness was normal. Systolic function was normal. The estimated ejection fraction was in the range of 55% to 60%. Wall motion was normal; there were no regional wall motion abnormalities. Doppler parameters are consistent with abnormal left ventricular relaxation (grade 1 diastolic dysfunction). The E/e' ratio is <10, suggesting normal LV filling pressure. - Mitral valve: Calcified annulus. Mild regurgitation. - Left atrium: The atrium was at the upper limits of normal in size. - Tricuspid valve: Mild regurgitation. - Pulmonary arteries: PA peak pressure: 94mm Hg (S). - Inferior vena cava: The vessel was normal in size; the respirophasic diameter changes were in the normal range (= 50%); findings are consistent with normal central venous pressure.   Seeing Dr Henrene Pastor for anemia  Had bleeding polyp removed     Husband [name] has DCM sees DB and had renal cell carcinoma post nephrectomy now   ROS: Denies fever, malais, weight loss, blurry vision, decreased visual acuity, cough, sputum, SOB, hemoptysis, pleuritic pain, palpitaitons, heartburn, abdominal pain, melena, lower extremity edema, claudication, or rash.  All other systems reviewed and negative  General: Affect appropriate Healthy:  appears stated age 83: normal Neck supple with no adenopathy JVP normal no bruits no thyromegaly Lungs clear with no wheezing and  good diaphragmatic motion Heart:  S1/S2 no murmur, no rub, gallop or click PMI normal Abdomen: benighn, BS positve, no tenderness, no AAA no bruit.  No HSM or HJR Distal pulses intact with no bruits No edema Neuro non-focal Skin warm and dry No muscular weakness   Current Outpatient Prescriptions  Medication Sig Dispense Refill  . aspirin 325 MG EC tablet Take 325 mg by mouth daily.    . carvedilol (COREG) 6.25 MG tablet Take 6.25 mg by mouth daily.    . clopidogrel (PLAVIX) 75 MG tablet Take 75 mg by mouth daily.    Marland Kitchen FeFum-FePoly-FA-B Cmp-C-Biot (INTEGRA PLUS) CAPS Take one tablet every day 30 capsule 2  . iron polysaccharides (NIFEREX) 150 MG capsule Take 150 mg by mouth daily.    Marland Kitchen losartan-hydrochlorothiazide (HYZAAR) 50-12.5 MG per tablet     . omeprazole (PRILOSEC) 20 MG capsule Take 20 mg by mouth daily.    . simvastatin (ZOCOR) 40 MG tablet Take 40 mg by mouth at bedtime.    . simvastatin (ZOCOR) 40 MG tablet TAKE ONE TABLET BY MOUTH ONCE DAILY AT BEDTIME 30 tablet 3   No current facility-administered medications for this visit.    Allergies  Review of patient's allergies indicates no known allergies.  Electrocardiogram: SR 89  ICRBBB 10/20/13    06/14/14  No change SR 79 ICRBBB   Assessment and Plan CAD: Stable with no angina and good activity level.  Continue medical Rx New nitro called in  HTN:  Well controlled.  Continue current medications and low sodium Dash type diet.    Chol:   Cholesterol is at  goal.  Continue current dose of statin and diet Rx.  No myalgias or side effects.  F/U  LFT's in 6 months. Lab Results  Component Value Date   LDLCALC 80 07/28/2013             Anemia:  On iron improved no bleeding   Lab Results  Component Value Date   HCT 31.8* 01/24/2014

## 2015-01-02 ENCOUNTER — Other Ambulatory Visit: Payer: Self-pay | Admitting: Internal Medicine

## 2015-01-02 ENCOUNTER — Other Ambulatory Visit: Payer: Self-pay | Admitting: Cardiovascular Disease

## 2015-01-03 ENCOUNTER — Other Ambulatory Visit: Payer: Self-pay

## 2015-01-03 ENCOUNTER — Ambulatory Visit (INDEPENDENT_AMBULATORY_CARE_PROVIDER_SITE_OTHER): Payer: 59 | Admitting: Cardiovascular Disease

## 2015-01-03 ENCOUNTER — Encounter: Payer: Self-pay | Admitting: Cardiovascular Disease

## 2015-01-03 VITALS — BP 128/72 | HR 83 | Ht 63.0 in | Wt 148.0 lb

## 2015-01-03 DIAGNOSIS — I2510 Atherosclerotic heart disease of native coronary artery without angina pectoris: Secondary | ICD-10-CM | POA: Diagnosis not present

## 2015-01-03 DIAGNOSIS — I2583 Coronary atherosclerosis due to lipid rich plaque: Principal | ICD-10-CM

## 2015-01-03 MED ORDER — NITROGLYCERIN 0.4 MG SL SUBL: 0.4000 mg | SUBLINGUAL_TABLET | SUBLINGUAL | Status: DC | PRN

## 2015-01-03 NOTE — Patient Instructions (Signed)
Medication Instructions:  NO CHANGES  Labwork: NONE  Testing/Procedures: NONE  Follow-Up: Your physician wants you to follow-up in: 6 MONTHS WITH DR NISHAN You will receive a reminder letter in the mail two months in advance. If you don't receive a letter, please call our office to schedule the follow-up appointment.  Any Other Special Instructions Will Be Listed Below (If Applicable).   

## 2015-01-11 ENCOUNTER — Telehealth: Payer: Self-pay

## 2015-01-11 MED ORDER — PANTOPRAZOLE SODIUM 40 MG PO TBEC: 40.0000 mg | DELAYED_RELEASE_TABLET | Freq: Every day | ORAL | Status: DC

## 2015-01-11 NOTE — Telephone Encounter (Signed)
[facility] pharmacist called wanting to know if it was ok to fill patient's omeprazole while he is on Plavix, which can cause interactions.  Spoke with Alonza Bogus who said if patient was concerned to send in Pantoprazole.  If for some reason this is not covered by patient's insurance - it is most likely ok to go back to Omeprazole as Dr. Henrene Pastor originally prescribed it knowing patient was on blood thinner.  Sent new rx for Pantoprazole.  Tried to call [facility] back to let them know of change but could not get through.

## 2015-03-05 ENCOUNTER — Other Ambulatory Visit: Payer: Self-pay | Admitting: Cardiovascular Disease

## 2015-05-30 ENCOUNTER — Other Ambulatory Visit: Payer: Self-pay | Admitting: Cardiovascular Disease

## 2015-05-30 ENCOUNTER — Other Ambulatory Visit: Payer: Self-pay | Admitting: Internal Medicine

## 2015-06-28 NOTE — Progress Notes (Signed)
Patient ID: [name], female   DOB: Nov 04, 1950, 65 y.o.   MRN: [id number]   [name] is seen today in followup for coronary artery disease. She has a distant history of anterior wall MI 1998. She had a bare-metal stent. She had reintervention in 2008. 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   Quit smoking   Taking coreg once daily bid makes her fatigued Restarted at lower dose and tolerating Hyzaar caused some dizzyness  BP running fine at home records reviewed   Echo 08/16/13 Normal EF  Study Conclusions  - Left ventricle: The cavity size was normal. Wall thickness was normal. Systolic function was normal. The estimated ejection fraction was in the range of 55% to 60%. Wall motion was normal; there were no regional wall motion abnormalities. Doppler parameters are consistent with abnormal left ventricular relaxation (grade 1 diastolic dysfunction). The E/e' ratio is <10, suggesting normal LV filling pressure. - Mitral valve: Calcified annulus. Mild regurgitation. - Left atrium: The atrium was at the upper limits of normal in size. - Tricuspid valve: Mild regurgitation. - Pulmonary arteries: PA peak pressure: 29mm Hg (S). - Inferior vena cava: The vessel was normal in size; the respirophasic diameter changes were in the normal range (= 50%); findings are consistent with normal central venous pressure.   Seeing Dr Henrene Pastor for anemia  Had bleeding polyps removed     Husband [name] has DCM sees DB and had renal cell carcinoma post nephrectomy now  Recent gluteal abscess and sister in [facility] has lung cancer   ROS: Denies fever, malais, weight loss, blurry vision, decreased visual acuity, cough, sputum, SOB, hemoptysis, pleuritic pain, palpitaitons, heartburn, abdominal pain, melena, lower extremity edema, claudication, or rash.  All other systems reviewed and negative  General: Affect appropriate Chronically ill white female  HEENT: normal Neck supple with  no adenopathy JVP normal no bruits no thyromegaly Lungs clear with no wheezing and good diaphragmatic motion Heart:  S1/S2 no murmur, no rub, gallop or click PMI normal Abdomen: benighn, BS positve, no tenderness, no AAA no bruit.  No HSM or HJR Distal pulses intact with no bruits No edema Neuro non-focal Skin warm and dry No muscular weakness   Current Outpatient Prescriptions  Medication Sig Dispense Refill  . aspirin 325 MG EC tablet Take 325 mg by mouth daily.    . carvedilol (COREG) 6.25 MG tablet Take 6.25 mg by mouth daily.    . clopidogrel (PLAVIX) 75 MG tablet TAKE ONE TABLET BY MOUTH  DAILY 30 tablet 8  . FeFum-FePoly-FA-B Cmp-C-Biot (INTEGRA PLUS) CAPS Take one tablet every day 30 capsule 2  . iron polysaccharides (NIFEREX) 150 MG capsule Take 150 mg by mouth daily.    Marland Kitchen losartan-hydrochlorothiazide (HYZAAR) 50-12.5 MG per tablet Take 1 tablet by mouth daily.     . nitroGLYCERIN (NITROSTAT) 0.4 MG SL tablet Place 1 tablet (0.4 mg total) under the tongue every 5 (five) minutes as needed for chest pain. 25 tablet 3  . pantoprazole (PROTONIX) 40 MG tablet TAKE ONE TABLET BY MOUTH DAILY. 30 tablet 3  . simvastatin (ZOCOR) 40 MG tablet TAKE ONE TABLET BY MOUTH ONCE DAILY AT BEDTIME 30 tablet 6   No current facility-administered medications for this visit.    Allergies  Review of patient's allergies indicates no known allergies.  Electrocardiogram: SR 89  ICRBBB 10/20/13    06/14/14  No change SR 79 ICRBBB   07/03/15  SR rate 83 normal  Assessment and Plan CAD: Stable with no angina and good activity level.  Continue medical Rx New nitro called in  HTN:  Well controlled.  Continue current medications and low sodium Dash type diet.  Always higher in office home readings fine   Chol:  Labs with Dr Forde Dandy  Cholesterol is at goal.  Continue current dose of statin and diet Rx.  No myalgias or side effects.  F/U  LFT's in 6 months. Lab Results  Component Value Date   LDLCALC  80 07/28/2013            Anemia:  On iron improved no bleeding  On iron encouraged her to f/u with Dr Henrene Pastor  Lab Results  Component Value Date   HCT 31.8* 01/24/2014    F/U with me in 6 months   Jenkins Rouge

## 2015-07-03 ENCOUNTER — Encounter: Payer: Self-pay | Admitting: Cardiovascular Disease

## 2015-07-03 ENCOUNTER — Ambulatory Visit (INDEPENDENT_AMBULATORY_CARE_PROVIDER_SITE_OTHER): Payer: BLUE CROSS/BLUE SHIELD | Admitting: Cardiovascular Disease

## 2015-07-03 VITALS — BP 170/94 | HR 84 | Ht 63.0 in | Wt 139.0 lb

## 2015-07-03 DIAGNOSIS — I2510 Atherosclerotic heart disease of native coronary artery without angina pectoris: Secondary | ICD-10-CM | POA: Diagnosis not present

## 2015-07-03 DIAGNOSIS — I10 Essential (primary) hypertension: Secondary | ICD-10-CM | POA: Diagnosis not present

## 2015-07-03 MED ORDER — SIMVASTATIN 40 MG PO TABS: 40.0000 mg | ORAL_TABLET | Freq: Every day | ORAL | Status: DC

## 2015-07-03 NOTE — Patient Instructions (Signed)

## 2015-07-05 ENCOUNTER — Ambulatory Visit: Payer: 59 | Admitting: Cardiovascular Disease

## 2015-07-26 ENCOUNTER — Encounter: Payer: Self-pay | Admitting: Internal Medicine

## 2015-10-16 ENCOUNTER — Other Ambulatory Visit: Payer: Self-pay | Admitting: Internal Medicine

## 2016-01-10 NOTE — Progress Notes (Signed)
Patient ID: [name], female   DOB: 1950-10-19, 65 y.o.   MRN: [id number]   [name] is seen today in followup for coronary artery disease. She has a distant history of anterior wall MI 1998. She had a bare-metal stent. She had reintervention in 2008. 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   Quit smoking   Taking coreg once daily bid makes her fatigued Restarted at lower dose and tolerating Hyzaar caused some dizzyness  BP running fine at home records reviewed   Echo 08/16/13 Normal EF  Study Conclusions  - Left ventricle: The cavity size was normal. Wall thickness was normal. Systolic function was normal. The estimated ejection fraction was in the range of 55% to 60%. Wall motion was normal; there were no regional wall motion abnormalities. Doppler parameters are consistent with abnormal left ventricular relaxation (grade 1 diastolic dysfunction). The E/e' ratio is <10, suggesting normal LV filling pressure. - Mitral valve: Calcified annulus. Mild regurgitation. - Left atrium: The atrium was at the upper limits of normal in size. - Tricuspid valve: Mild regurgitation. - Pulmonary arteries: PA peak pressure: 88mm Hg (S). - Inferior vena cava: The vessel was normal in size; the respirophasic diameter changes were in the normal range (= 50%); findings are consistent with normal central venous pressure.   Seeing Destiny [name] for anemia  Had bleeding polyps removed     Husband [name] has DCM sees [name] and had renal cell carcinoma post nephrectomy now  Recent gluteal abscess and sister in [name] has lung cancer   ROS: Denies fever, malais, weight loss, blurry vision, decreased visual acuity, cough, sputum, SOB, hemoptysis, pleuritic pain, palpitaitons, heartburn, abdominal pain, melena, lower extremity edema, claudication, or rash.  All other systems reviewed and negative  General: Affect appropriate Chronically ill white female  Pale  HEENT: normal Neck supple  with no adenopathy JVP normal left bruits no thyromegaly Lungs clear with no wheezing and good diaphragmatic motion Heart:  S1/S2 no murmur, no rub, gallop or click PMI normal Abdomen: benighn, BS positve, no tenderness, no AAA no bruit.  No HSM or HJR Distal pulses intact with no bruits No edema Neuro non-focal Skin warm and dry No muscular weakness   Current Outpatient Prescriptions  Medication Sig Dispense Refill  . aspirin 325 MG EC tablet Take 325 mg by mouth daily.    . carvedilol (COREG) 6.25 MG tablet Take 6.25 mg by mouth daily.    . clopidogrel (PLAVIX) 75 MG tablet TAKE ONE TABLET BY MOUTH  DAILY 30 tablet 8  . FeFum-FePoly-FA-B Cmp-C-Biot (INTEGRA PLUS) CAPS Take one tablet every day 30 capsule 2  . iron polysaccharides (NIFEREX) 150 MG capsule Take 150 mg by mouth daily.    Marland Kitchen losartan-hydrochlorothiazide (HYZAAR) 50-12.5 MG per tablet Take 1 tablet by mouth daily.     . nitroGLYCERIN (NITROSTAT) 0.4 MG SL tablet Place 1 tablet (0.4 mg total) under the tongue every 5 (five) minutes as needed for chest pain. 25 tablet 3  . pantoprazole (PROTONIX) 40 MG tablet TAKE 1 TABLET BY MOUTH EVERY DAY 30 tablet 3  . simvastatin (ZOCOR) 40 MG tablet Take 1 tablet (40 mg total) by mouth daily at 6 PM. 30 tablet 11   No current facility-administered medications for this visit.     Allergies  Review of patient's allergies indicates no known allergies.  Electrocardiogram: SR 89  ICRBBB 10/20/13    06/14/14  No change SR 79 ICRBBB   07/03/15  SR rate 83 normal   Assessment and Plan CAD: Stable with no angina and good activity level.  Continue medical Rx New nitro called in  HTN:  Well controlled.  Continue current medications and low sodium Dash type diet.  Always higher in office home readings fine   Chol:  Labs with [name]  Cholesterol is at goal.  Continue current dose of statin and diet Rx.  No myalgias or side effects.  F/U  LFT's in 6 months. Lab Results  Component Value  Date   LDLCALC 80 07/28/2013           Anemia:  On iron improved no bleeding  On iron encouraged her to f/u with Destiny [name]  Check Hct today looks pale  Lab Results  Component Value Date   HCT 31.8 (L) 01/24/2014   Bruit : f/u duplex with known vascular dx left bruit   F/U with me in 6 months  Carotid Hct, BMET TSH  [name]

## 2016-01-16 ENCOUNTER — Encounter (HOSPITAL_COMMUNITY): Payer: Self-pay | Admitting: Emergency Medicine

## 2016-01-16 ENCOUNTER — Telehealth: Payer: Self-pay | Admitting: Cardiovascular Disease

## 2016-01-16 ENCOUNTER — Ambulatory Visit (INDEPENDENT_AMBULATORY_CARE_PROVIDER_SITE_OTHER): Payer: BLUE CROSS/BLUE SHIELD | Admitting: Cardiovascular Disease

## 2016-01-16 ENCOUNTER — Observation Stay (HOSPITAL_COMMUNITY)
Admission: EM | Admit: 2016-01-16 | Discharge: 2016-01-17 | Disposition: A | Discharge status: Home / Self Care | Payer: BLUE CROSS/BLUE SHIELD | Attending: Internal Medicine | Admitting: Internal Medicine

## 2016-01-16 ENCOUNTER — Encounter: Payer: Self-pay | Admitting: Cardiovascular Disease

## 2016-01-16 VITALS — BP 120/60 | HR 89 | Ht 64.0 in | Wt 149.4 lb

## 2016-01-16 DIAGNOSIS — Z7902 Long term (current) use of antithrombotics/antiplatelets: Secondary | ICD-10-CM | POA: Insufficient documentation

## 2016-01-16 DIAGNOSIS — Z86010 Personal history of colonic polyps: Secondary | ICD-10-CM

## 2016-01-16 DIAGNOSIS — I110 Hypertensive heart disease with heart failure: Secondary | ICD-10-CM | POA: Insufficient documentation

## 2016-01-16 DIAGNOSIS — I252 Old myocardial infarction: Secondary | ICD-10-CM | POA: Insufficient documentation

## 2016-01-16 DIAGNOSIS — D649 Anemia, unspecified: Principal | ICD-10-CM | POA: Insufficient documentation

## 2016-01-16 DIAGNOSIS — R791 Abnormal coagulation profile: Secondary | ICD-10-CM | POA: Insufficient documentation

## 2016-01-16 DIAGNOSIS — Z79899 Other long term (current) drug therapy: Secondary | ICD-10-CM | POA: Insufficient documentation

## 2016-01-16 DIAGNOSIS — Z7982 Long term (current) use of aspirin: Secondary | ICD-10-CM | POA: Insufficient documentation

## 2016-01-16 DIAGNOSIS — R0989 Other specified symptoms and signs involving the circulatory and respiratory systems: Secondary | ICD-10-CM

## 2016-01-16 DIAGNOSIS — R718 Other abnormality of red blood cells: Secondary | ICD-10-CM | POA: Diagnosis present

## 2016-01-16 DIAGNOSIS — I5022 Chronic systolic (congestive) heart failure: Secondary | ICD-10-CM | POA: Insufficient documentation

## 2016-01-16 DIAGNOSIS — I2583 Coronary atherosclerosis due to lipid rich plaque: Principal | ICD-10-CM

## 2016-01-16 DIAGNOSIS — I2510 Atherosclerotic heart disease of native coronary artery without angina pectoris: Secondary | ICD-10-CM | POA: Insufficient documentation

## 2016-01-16 DIAGNOSIS — Z955 Presence of coronary angioplasty implant and graft: Secondary | ICD-10-CM | POA: Diagnosis not present

## 2016-01-16 DIAGNOSIS — Z87891 Personal history of nicotine dependence: Secondary | ICD-10-CM | POA: Diagnosis not present

## 2016-01-16 DIAGNOSIS — I509 Heart failure, unspecified: Secondary | ICD-10-CM

## 2016-01-16 DIAGNOSIS — I10 Essential (primary) hypertension: Secondary | ICD-10-CM | POA: Diagnosis present

## 2016-01-16 DIAGNOSIS — E785 Hyperlipidemia, unspecified: Secondary | ICD-10-CM | POA: Diagnosis present

## 2016-01-16 LAB — COMPREHENSIVE METABOLIC PANEL
ALT: 15 U/L (ref 14–54)
ANION GAP: 5 (ref 5–15)
AST: 18 U/L (ref 15–41)
Albumin: 3.8 g/dL (ref 3.5–5.0)
Alkaline Phosphatase: 58 U/L (ref 38–126)
BILIRUBIN TOTAL: 0.3 mg/dL (ref 0.3–1.2)
BUN: 8 mg/dL (ref 6–20)
CALCIUM: 8.7 mg/dL — AB (ref 8.9–10.3)
CO2: 24 mmol/L (ref 22–32)
Chloride: 107 mmol/L (ref 101–111)
Creatinine, Ser: 0.81 mg/dL (ref 0.44–1.00)
GFR calc Af Amer: >60 mL/min (ref >60)
GLUCOSE: 103 mg/dL — AB (ref 65–99)
Potassium: 3.9 mmol/L (ref 3.5–5.1)
Sodium: 136 mmol/L (ref 135–145)
TOTAL PROTEIN: 7.3 g/dL (ref 6.5–8.1)

## 2016-01-16 LAB — IRON AND TIBC
Iron: 11 ug/dL — ABNORMAL LOW (ref 28–170)
Saturation Ratios: 2 % — ABNORMAL LOW (ref 10.4–31.8)
TIBC: 452 ug/dL — AB (ref 250–450)
UIBC: 441 ug/dL

## 2016-01-16 LAB — CBC WITH DIFFERENTIAL/PLATELET
BASOS ABS: 74 {cells}/uL (ref 0–200)
BASOS ABS: 0.1 10*3/uL (ref 0.0–0.1)
Basophils Relative: 1 %
Basophils Relative: 1 %
EOS PCT: 3 %
Eosinophils Absolute: 148 cells/uL (ref 15–500)
Eosinophils Absolute: 0.2 10*3/uL (ref 0.0–0.7)
Eosinophils Relative: 2 %
HEMATOCRIT: 20.6 % — AB (ref 35.0–45.0)
HEMATOCRIT: 22.3 % — AB (ref 36.0–46.0)
HEMOGLOBIN: 5.6 g/dL — AB (ref 11.7–15.5)
Hemoglobin: 5.7 g/dL — CL (ref 12.0–15.0)
LYMPHS ABS: 1406 {cells}/uL (ref 850–3900)
LYMPHS ABS: 1.8 10*3/uL (ref 0.7–4.0)
Lymphocytes Relative: 30 %
MCH: 16.8 pg — ABNORMAL LOW (ref 27.0–33.0)
MCH: 16.4 pg — ABNORMAL LOW (ref 26.0–34.0)
MCHC: 27.2 g/dL — ABNORMAL LOW (ref 32.0–36.0)
MCHC: 25.6 g/dL — AB (ref 30.0–36.0)
MCV: 61.7 fL — ABNORMAL LOW (ref 80.0–100.0)
MCV: 64.3 fL — AB (ref 78.0–100.0)
MONO ABS: 0.6 10*3/uL (ref 0.1–1.0)
MONOS PCT: 10 %
MPV: 9.1 fL (ref 7.5–12.5)
Monocytes Absolute: 740 cells/uL (ref 200–950)
Monocytes Relative: 10 %
NEUTROS ABS: 5032 {cells}/uL (ref 1500–7800)
NEUTROS ABS: 3.4 10*3/uL (ref 1.7–7.7)
Neutrophils Relative %: 68 %
Neutrophils Relative %: 56 %
Platelets: 453 10*3/uL — ABNORMAL HIGH (ref 140–400)
Platelets: 443 10*3/uL — ABNORMAL HIGH (ref 150–400)
RBC: 3.34 MIL/uL — AB (ref 3.80–5.10)
RBC: 3.47 MIL/uL — ABNORMAL LOW (ref 3.87–5.11)
RDW: 20.3 % — ABNORMAL HIGH (ref 11.0–15.0)
RDW: 20.6 % — AB (ref 11.5–15.5)
WBC: 7.4 10*3/uL (ref 3.8–10.8)
WBC: 6.1 10*3/uL (ref 4.0–10.5)

## 2016-01-16 LAB — BASIC METABOLIC PANEL
BUN: 10 mg/dL (ref 7–25)
CALCIUM: 9 mg/dL (ref 8.6–10.4)
CO2: 24 mmol/L (ref 20–31)
Chloride: 104 mmol/L (ref 98–110)
Creat: 0.8 mg/dL (ref 0.50–0.99)
Glucose, Bld: 102 mg/dL — ABNORMAL HIGH (ref 65–99)
Potassium: 4.9 mmol/L (ref 3.5–5.3)
SODIUM: 137 mmol/L (ref 135–146)

## 2016-01-16 LAB — APTT: aPTT: 37 seconds — ABNORMAL HIGH (ref 24–36)

## 2016-01-16 LAB — PREPARE RBC (CROSSMATCH)

## 2016-01-16 LAB — FERRITIN: FERRITIN: 1 ng/mL — AB (ref 11–307)

## 2016-01-16 LAB — POC OCCULT BLOOD, ED: FECAL OCCULT BLD: NEGATIVE

## 2016-01-16 LAB — PROTIME-INR
INR: 0.96
Prothrombin Time: 12.8 seconds (ref 11.4–15.2)

## 2016-01-16 LAB — ABO/RH: ABO/RH(D): A POS

## 2016-01-16 LAB — TSH: TSH: 2.75 m[IU]/L

## 2016-01-16 MED ORDER — SIMVASTATIN 40 MG PO TABS: 40.0000 mg | ORAL_TABLET | Freq: Every day | ORAL | Status: DC

## 2016-01-16 MED ORDER — SODIUM CHLORIDE 0.9 % IV SOLN
10.0000 mL/h | Freq: Once | INTRAVENOUS | Status: AC
  Administered 2016-01-16: 10 mL/h via INTRAVENOUS

## 2016-01-16 MED ORDER — SODIUM CHLORIDE 0.9 % IV SOLN: 250.0000 mL | INTRAVENOUS | Status: DC | PRN

## 2016-01-16 MED ORDER — ACETAMINOPHEN 325 MG PO TABS
650.0000 mg | ORAL_TABLET | Freq: Four times a day (QID) | ORAL | Status: DC | PRN
Start: 2016-01-16 — End: 2016-01-17

## 2016-01-16 MED ORDER — ONDANSETRON HCL 4 MG/2ML IJ SOLN: 4.0000 mg | Freq: Four times a day (QID) | INTRAMUSCULAR | Status: DC | PRN

## 2016-01-16 MED ORDER — ONDANSETRON HCL 4 MG PO TABS: 4.0000 mg | ORAL_TABLET | Freq: Four times a day (QID) | ORAL | Status: DC | PRN

## 2016-01-16 MED ORDER — HYDROCHLOROTHIAZIDE 12.5 MG PO CAPS
12.5000 mg | ORAL_CAPSULE | Freq: Every day | ORAL | Status: DC
  Administered 2016-01-17: 12.5 mg via ORAL
  Filled 2016-01-16: qty 1

## 2016-01-16 MED ORDER — SODIUM CHLORIDE 0.9% FLUSH
3.0000 mL | Freq: Two times a day (BID) | INTRAVENOUS | Status: DC
  Administered 2016-01-17: 3 mL via INTRAVENOUS

## 2016-01-16 MED ORDER — LOSARTAN POTASSIUM-HCTZ 50-12.5 MG PO TABS: 1.0000 | ORAL_TABLET | Freq: Every day | ORAL | Status: DC

## 2016-01-16 MED ORDER — B COMPLEX-C PO TABS
1.0000 | ORAL_TABLET | Freq: Every day | ORAL | Status: DC
  Administered 2016-01-17: 1 via ORAL
  Filled 2016-01-16: qty 1

## 2016-01-16 MED ORDER — CARVEDILOL 6.25 MG PO TABS
6.2500 mg | ORAL_TABLET | Freq: Every day | ORAL | Status: DC
  Administered 2016-01-17: 6.25 mg via ORAL
  Filled 2016-01-16: qty 1

## 2016-01-16 MED ORDER — CLOPIDOGREL BISULFATE 75 MG PO TABS
75.0000 mg | ORAL_TABLET | Freq: Every day | ORAL | Status: DC
  Administered 2016-01-17: 75 mg via ORAL
  Filled 2016-01-16: qty 1

## 2016-01-16 MED ORDER — PANTOPRAZOLE SODIUM 40 MG PO TBEC
40.0000 mg | DELAYED_RELEASE_TABLET | Freq: Every day | ORAL | Status: DC
  Administered 2016-01-17: 40 mg via ORAL
  Filled 2016-01-16: qty 1

## 2016-01-16 MED ORDER — LOSARTAN POTASSIUM 50 MG PO TABS
50.0000 mg | ORAL_TABLET | Freq: Every day | ORAL | Status: DC
  Administered 2016-01-17: 50 mg via ORAL
  Filled 2016-01-16: qty 1

## 2016-01-16 MED ORDER — ASPIRIN EC 325 MG PO TBEC
325.0000 mg | DELAYED_RELEASE_TABLET | Freq: Every day | ORAL | Status: DC
  Administered 2016-01-17: 325 mg via ORAL
  Filled 2016-01-16: qty 1

## 2016-01-16 MED ORDER — ACETAMINOPHEN 650 MG RE SUPP: 650.0000 mg | Freq: Four times a day (QID) | RECTAL | Status: DC | PRN

## 2016-01-16 MED ORDER — SODIUM CHLORIDE 0.9% FLUSH: 3.0000 mL | INTRAVENOUS | Status: DC | PRN

## 2016-01-16 MED ORDER — POLYSACCHARIDE IRON COMPLEX 150 MG PO CAPS
150.0000 mg | ORAL_CAPSULE | Freq: Every day | ORAL | Status: DC
  Administered 2016-01-17: 150 mg via ORAL
  Filled 2016-01-16: qty 1

## 2016-01-16 NOTE — ED Notes (Signed)
MD at bedside. 

## 2016-01-16 NOTE — Telephone Encounter (Signed)
Reviewed with Dr Esmeralda Links is advised to go to [facility] ED for further evaluation. I spoke with pt's husband and is going to take her to [facility] ED now.

## 2016-01-16 NOTE — ED Triage Notes (Signed)
Pt st's she was called today by her MD and was told to come to the ED ref. Low Hgb.

## 2016-01-16 NOTE — Telephone Encounter (Signed)
[name] calling to report HGB 5.6.

## 2016-01-16 NOTE — Progress Notes (Signed)
Admission note:  Arrival Method: Patient arrived on stretcher from ED Mental Orientation: Alert and oriented x 4 Telemetry: N/A Assessment: completed, see flowsheets Skin: Dry and intact. Dry, red spot noted to patients right lower abdomen. Skin assessed with Nena Polio, RN IV: Left AC Pain: Patient states no pain at this time Tubes: N/A Safety Measures: Bed in lowest position, call light within reach. Patient refusing non-slip socks at this time Fall Prevention Safety Plan: Reviewed with patient Admission Screening: Completed 6700 Orientation: Patient has been oriented to the unit, staff and to the room. Orders have been reviewed and implemented. Call light within reach, will continue to monitor the patient closely.   Shelbie Hutching, RN, BSN

## 2016-01-16 NOTE — Patient Instructions (Addendum)
Medication Instructions:  Your physician recommends that you continue on your current medications as directed. Please refer to the Current Medication list given to you today.  Labwork: Your physician recommends that you have lab work today. CBC, BMET, TSH   Testing/Procedures: Your physician has requested that you have a carotid duplex. This test is an ultrasound of the carotid arteries in your neck. It looks at blood flow through these arteries that supply the brain with blood. Allow one hour for this exam. There are no restrictions or special instructions.   Follow-Up: Your physician wants you to follow-up in: 6 months with Dr. Johnsie Cancel. You will receive a reminder letter in the mail two months in advance. If you don't receive a letter, please call our office to schedule the follow-up appointment.   If you need a refill on your cardiac medications before your next appointment, please call your pharmacy.

## 2016-01-16 NOTE — ED Triage Notes (Signed)
Pt denies any pain st's she just feels tired and weak

## 2016-01-16 NOTE — Telephone Encounter (Signed)
[name] is calling from [facility] to report a critical Lab

## 2016-01-16 NOTE — ED Provider Notes (Signed)
[facility] DEPT Provider Note   CSN: [id number] Arrival date & time: 01/16/16  1803     History   Chief Complaint Chief Complaint  Patient presents with  . Abnormal Lab    HPI [name] is a 65 y.o. female.  65 year old female with past medical history including CHF, AAA, CAD, anemia, who p/w abnormal lab work. Patient reports that today she was contacted by her primary doctor regarding a low hemoglobin lab and told to the come to the ER. The patient has a history of anemia and is currently taking iron. Her stool is dark because of the iron but she has not noticed any changes in the color of her stool recently. She does note lightheadedness with standing and generalized fatigue and weakness when exerting herself. She endorses shortness of breath with exertion that completely resolved with rest. No chest or abdominal pain. No fevers or recent illness.   The history is provided by the patient.    Past Medical History:  Diagnosis Date  . ABDOMINAL AORTIC ANEURYSM   . Anemia   . CAD   . CONGESTIVE HEART FAILURE, SYSTOLIC, CHRONIC   . CT, CHEST, ABNORMAL   . History of colon polyps 01/05/2014   adenomatous polyps  . HYPERLIPIDEMIA   . HYPERTENSION   . ISCHEMIC CARDIOMYOPATHY   . Lower GI bleed 01/2014  . 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 TOBACCO ABUSE     Patient Active Problem List   Diagnosis Date Noted  . History of colonic polyps 04/20/2014  . Chronic anticoagulation 04/20/2014  . Multiple adenomatous polyps 01/24/2014  . Lower GI bleed 01/05/2014  . Post-polypectomy bleeding 01/05/2014  . NONSPECIFIC ABN FINDNG RAD&OTH EXAM BILARY TRCT 11/09/2008  . Nonspecific (abnormal) findings on radiological and other examination of body structure 11/01/2008  . CT, CHEST, ABNORMAL 11/01/2008  . ABDOMINAL AORTIC ANEURYSM 10/26/2008  . Elevated lipids 10/25/2008  . TOBACCO ABUSE 10/25/2008  .  Essential hypertension 10/25/2008  . Coronary atherosclerosis 10/25/2008  . ISCHEMIC CARDIOMYOPATHY 10/25/2008  . CONGESTIVE HEART FAILURE, SYSTOLIC, CHRONIC 0000000    Past Surgical History:  Procedure Laterality Date  . bare metal stent placement     left anterior descending artery  . COLONOSCOPY N/A 01/05/2014   Procedure: COLONOSCOPY;  Surgeon: Jerene Bears, MD;  Location: [facility];  Service: Endoscopy;  Laterality: N/A;  . COLONOSCOPY N/A 01/06/2014   Procedure: COLONOSCOPY;  Surgeon: Jerene Bears, MD;  Location: [facility];  Service: Endoscopy;  Laterality: N/A;    OB History    No data available       Home Medications    Prior to Admission medications   Medication Sig Start Date End Date Taking? Authorizing Provider  aspirin 325 MG EC tablet Take 325 mg by mouth daily.    Historical Provider, MD  carvedilol (COREG) 6.25 MG tablet Take 6.25 mg by mouth daily. 01/02/15   Historical Provider, MD  clopidogrel (PLAVIX) 75 MG tablet TAKE ONE TABLET BY MOUTH  DAILY 05/31/15   Josue Hector, MD  FeFum-FePoly-FA-B Cmp-C-Biot (INTEGRA PLUS) CAPS Take one tablet every day 01/25/14   Amy S Esterwood, PA-C  iron polysaccharides (NIFEREX) 150 MG capsule Take 150 mg by mouth daily.    Historical Provider, MD  losartan-hydrochlorothiazide (HYZAAR) 50-12.5 MG per tablet Take 1 tablet by mouth daily.  12/01/14   Historical Provider, MD  nitroGLYCERIN (NITROSTAT) 0.4 MG SL tablet Place  1 tablet (0.4 mg total) under the tongue every 5 (five) minutes as needed for chest pain. 01/03/15   Josue Hector, MD  pantoprazole (PROTONIX) 40 MG tablet TAKE 1 TABLET BY MOUTH EVERY DAY 10/17/15   Irene Shipper, MD  simvastatin (ZOCOR) 40 MG tablet Take 1 tablet (40 mg total) by mouth daily at 6 PM. 07/03/15   Josue Hector, MD    Family History Family History  Problem Relation Age of Onset  . Lymphoma Mother   . Colon polyps Father   . Diabetes Maternal Grandmother   . Colon cancer Neg Hx      Social History Social History  Substance Use Topics  . Smoking status: Former Smoker    Types: Cigarettes    Quit date: 06/26/2012  . Smokeless tobacco: Never Used  . Alcohol use 1.2 oz/week    2 Cans of beer per week     Comment: rare     Allergies   Review of patient's allergies indicates no known allergies.   Review of Systems Review of Systems 10 Systems reviewed and are negative for acute change except as noted in the HPI.   Physical Exam Updated Vital Signs BP 168/76   Pulse 79   Temp 98.7 F (37.1 C) (Oral)   Resp 16   Ht 5\' 4"  (1.626 m)   Wt 149 lb (67.6 kg)   SpO2 100%   BMI 25.58 kg/m   Physical Exam  Constitutional: She is oriented to person, place, and time. She appears well-developed and well-nourished. No distress.  pale  HENT:  Head: Normocephalic and atraumatic.  Moist mucous membranes, pale tongue  Eyes: Pupils are equal, round, and reactive to light.  Pale conjunctivae  Neck: Neck supple.  Cardiovascular: Normal rate, regular rhythm and normal heart sounds.   No murmur heard. Pulmonary/Chest: Effort normal and breath sounds normal.  Abdominal: Soft. Bowel sounds are normal. She exhibits no distension. There is no tenderness.  Genitourinary: Rectal exam shows guaiac negative stool.  Genitourinary Comments: Stool dark, no gross blood  Musculoskeletal: She exhibits no edema.  Neurological: She is alert and oriented to person, place, and time.  Fluent speech  Skin: Skin is warm and dry. There is pallor.  Psychiatric: She has a normal mood and affect. Judgment normal.  Nursing note and vitals reviewed.  Chaperone was present during exam.   ED Treatments / Results  Labs (all labs ordered are listed, but only abnormal results are displayed) Labs Reviewed  CBC WITH DIFFERENTIAL/PLATELET - Abnormal; Notable for the following:       Result Value   RBC 3.47 (*)    Hemoglobin 5.7 (*)    HCT 22.3 (*)    MCV 64.3 (*)    MCH 16.4 (*)     MCHC 25.6 (*)    RDW 20.6 (*)    Platelets 443 (*)    All other components within normal limits  COMPREHENSIVE METABOLIC PANEL - Abnormal; Notable for the following:    Glucose, Bld 103 (*)    Calcium 8.7 (*)    All other components within normal limits  APTT - Abnormal; Notable for the following:    aPTT 37 (*)    All other components within normal limits  PROTIME-INR  IRON AND TIBC  FERRITIN  POC OCCULT BLOOD, ED  TYPE AND SCREEN  ABO/RH    EKG  EKG Interpretation None       Radiology No results found.  Procedures .Critical Care  Performed by: Sharlett Iles Authorized by: Sharlett Iles   Critical care provider statement:    Critical care time (minutes):  30   Critical care time was exclusive of:  Separately billable procedures and treating other patients   Critical care was necessary to treat or prevent imminent or life-threatening deterioration of the following conditions: symptomatic anemia.   Critical care was time spent personally by me on the following activities:  Development of treatment plan with patient or surrogate, evaluation of patient's response to treatment, examination of patient, obtaining history from patient or surrogate, ordering and performing treatments and interventions, ordering and review of laboratory studies, re-evaluation of patient's condition and review of old charts   (including critical care time)  Medications Ordered in ED Medications - No data to display   Initial Impression / Assessment and Plan / ED Course  I have reviewed the triage vital signs and the nursing notes.  Pertinent labs that were available during my care of the patient were reviewed by me and considered in my medical decision making (see chart for details).  Clinical Course   PT w/ CAD who p/w fatigue and SOB on exertion; sent in after PCP noted low hemoglobin on labs. She was awake and alert, pale but comfortable on exam with reassuring vital  signs. No complaints on exam and no reports of pain. Lab work here shows hemoglobin of 5.7. Because of her cardiac history, I have consented the patient for blood transfusion and ordered 2 units pRBC. Patient is Hemoccult negative and denies any history of GI bleeding recently. I discussed admission with Dr. Lorin Mercy and patient admitted to [facility] for further care.  Final Clinical Impressions(s) / ED Diagnoses   Final diagnoses:  None    New Prescriptions New Prescriptions   No medications on file     Sharlett Iles, MD 01/16/16 2056

## 2016-01-16 NOTE — H&P (Signed)
History and Physical    [name] [id number] DOB: July 17, 1950 DOA: 01/16/2016  PCP: Sheela Stack, MD Consultants:  Johnsie Cancel - cardiology; Christoper Fabian - GI Patient coming from: home - lives with husband  Chief Complaint:  Sent from cardiology for low hemoglobin  HPI: [name] is a 65 y.o. female with medical history significant of ASCVD, CHF, HTN,  HLD, and anemia presenting with symptomatic anemia.  Patient had a routine follow-up appointment with Dr. Johnsie Cancel today and he thought she looked pale and so checked labs.  Has been feeling weak/dizzy x 1 month.  No energy.  +DOE.  Hgb 5.6.  She needed a transfusion 2 years ago - but had a large polyp with GI bleeding at that time.  Does not think she she has seen blood in stools, but stools are very dark due to oral iron supplements.  No other bleeding sources, no periods.  Colonoscopy was 2 years ago with large polyp, repeat 1 year ago with 1 small polyp.  Next planned c-scope is sometime this year.     ED Course:  From Dr. Rex Kras: PT w/ CAD who p/w fatigue and SOB on exertion; sent in after PCP noted low hemoglobin on labs. She was awake and alert, pale but comfortable on exam with reassuring vital signs. No complaints on exam and no reports of pain. Lab work here shows hemoglobin of 5.7. Because of her cardiac history, I have consented the patient for blood transfusion and ordered 2 units pRBC. Patient is Hemoccult negative and denies any history of GI bleeding recently. I discussed admission with Dr. Lorin Mercy and patient admitted to [facility] for further care.   Review of Systems: As per HPI; otherwise 10 point review of systems reviewed and negative.   Ambulatory Status:  Walks without assistance  Past Medical History:  Diagnosis Date  . ABDOMINAL AORTIC ANEURYSM   . Anemia   . CAD   . CONGESTIVE HEART FAILURE, SYSTOLIC, CHRONIC   . CT, CHEST, ABNORMAL   . History of colon polyps 01/05/2014   adenomatous polyps  . HYPERLIPIDEMIA   .  HYPERTENSION   . ISCHEMIC CARDIOMYOPATHY   . Lower GI bleed 01/2014  . 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 TOBACCO ABUSE     Past Surgical History:  Procedure Laterality Date  . bare metal stent placement     left anterior descending artery  . COLONOSCOPY N/A 01/05/2014   Procedure: COLONOSCOPY;  Surgeon: Jerene Bears, MD;  Location: [facility];  Service: Endoscopy;  Laterality: N/A;  . COLONOSCOPY N/A 01/06/2014   Procedure: COLONOSCOPY;  Surgeon: Jerene Bears, MD;  Location: [facility];  Service: Endoscopy;  Laterality: N/A;    Social History   Social History  . Marital status: Married    Spouse name: Clair Gulling  . Number of children: 0  . Years of education: 9th   Occupational History  . retired    Social History Main Topics  . Smoking status: Former Smoker    Types: Cigarettes    Quit date: 06/26/2012  . Smokeless tobacco: Never Used  . Alcohol use Yes     Comment: 3-4 drinks/week  . Drug use: No  . Sexual activity: Not on file   Other Topics Concern  . Not on file   Social History Narrative  . No narrative on file    No Known Allergies  Family History  Problem Relation Age  of Onset  . Lymphoma Mother 77  . Colon polyps Father   . Depression Father 20    died of suicide  . Diabetes Maternal Grandmother   . Anemia Sister   . Colon polyps Sister   . Lung cancer Sister   . Colon cancer Neg Hx     Prior to Admission medications   Medication Sig Start Date End Date Taking? Authorizing Provider  aspirin 325 MG EC tablet Take 325 mg by mouth daily.    Historical Provider, MD  carvedilol (COREG) 6.25 MG tablet Take 6.25 mg by mouth daily. 01/02/15   Historical Provider, MD  clopidogrel (PLAVIX) 75 MG tablet TAKE ONE TABLET BY MOUTH  DAILY 05/31/15   Josue Hector, MD  FeFum-FePoly-FA-B Cmp-C-Biot (INTEGRA PLUS) CAPS Take one tablet every day 01/25/14   Amy S Esterwood, PA-C  iron polysaccharides  (NIFEREX) 150 MG capsule Take 150 mg by mouth daily.    Historical Provider, MD  losartan-hydrochlorothiazide (HYZAAR) 50-12.5 MG per tablet Take 1 tablet by mouth daily.  12/01/14   Historical Provider, MD  nitroGLYCERIN (NITROSTAT) 0.4 MG SL tablet Place 1 tablet (0.4 mg total) under the tongue every 5 (five) minutes as needed for chest pain. 01/03/15   Josue Hector, MD  pantoprazole (PROTONIX) 40 MG tablet TAKE 1 TABLET BY MOUTH EVERY DAY 10/17/15   Irene Shipper, MD  simvastatin (ZOCOR) 40 MG tablet Take 1 tablet (40 mg total) by mouth daily at 6 PM. 07/03/15   Josue Hector, MD    Physical Exam: Vitals:   01/16/16 2045 01/16/16 2101 01/16/16 2115 01/16/16 2140  BP: 150/68 145/61 152/66 128/62  Pulse: 70 73 81 78  Resp: 15 14 14 16   Temp:  98.3 F (36.8 C)  98.2 F (36.8 C)  TempSrc:  Oral  Oral  SpO2: 99% 100% 100% 100%  Weight:      Height:         General:  Appears calm and comfortable and is NAD; +skin pallor Eyes:  PERRL, EOMI, normal lids, iris; pale conjunctivae ENT:  grossly normal hearing, lips & tongue, mmm Neck:  no LAD, masses or thyromegaly; +left-sided carotid bruit Cardiovascular:  RRR, no m/r/g. No LE edema.  Respiratory:  CTA bilaterally, no w/r/r. Normal respiratory effort. Abdomen:  soft, ntnd, NABS Skin:  no rash or induration seen on limited exam Musculoskeletal:  grossly normal tone BUE/BLE, good ROM, no bony abnormality Psychiatric:  grossly normal mood and affect, speech fluent and appropriate, AOx3 Neurologic:  CN 2-12 grossly intact, moves all extremities in coordinated fashion, sensation intact  Labs on Admission: I have personally reviewed following labs and imaging studies  CBC:  Recent Labs Lab 01/16/16 0855 01/16/16 1835  WBC 7.4 6.1  NEUTROABS 5,032 3.4  HGB 5.6* 5.7*  HCT 20.6* 22.3*  MCV 61.7* 64.3*  PLT 453* 123456*   Basic Metabolic Panel:  Recent Labs Lab 01/16/16 0855 01/16/16 1835  NA 137 136  K 4.9 3.9  CL 104 107  CO2  24 24  GLUCOSE 102* 103*  BUN 10 8  CREATININE 0.80 0.81  CALCIUM 9.0 8.7*   GFR: Estimated Creatinine Clearance: 67.2 mL/min (by C-G formula based on SCr of 0.81 mg/dL). Liver Function Tests:  Recent Labs Lab 01/16/16 1835  AST 18  ALT 15  ALKPHOS 58  BILITOT 0.3  PROT 7.3  ALBUMIN 3.8   No results for input(s): LIPASE, AMYLASE in the last 168 hours. No results for input(s):  AMMONIA in the last 168 hours. Coagulation Profile:  Recent Labs Lab 01/16/16 1835  INR 0.96   Cardiac Enzymes: No results for input(s): CKTOTAL, CKMB, CKMBINDEX, TROPONINI in the last 168 hours. BNP (last 3 results) No results for input(s): PROBNP in the last 8760 hours. HbA1C: No results for input(s): HGBA1C in the last 72 hours. CBG: No results for input(s): GLUCAP in the last 168 hours. Lipid Profile: No results for input(s): CHOL, HDL, LDLCALC, TRIG, CHOLHDL, LDLDIRECT in the last 72 hours. Thyroid Function Tests:  Recent Labs  01/16/16 0855  TSH 2.75   Anemia Panel:  Recent Labs  01/16/16 2015  FERRITIN 1*  TIBC 452*  IRON 11*   Urine analysis: No results found for: COLORURINE, APPEARANCEUR, LABSPEC, PHURINE, GLUCOSEU, HGBUR, BILIRUBINUR, KETONESUR, PROTEINUR, UROBILINOGEN, NITRITE, LEUKOCYTESUR  Creatinine Clearance: Estimated Creatinine Clearance: 67.2 mL/min (by C-G formula based on SCr of 0.81 mg/dL).  Sepsis Labs: @LABRCNTIP (procalcitonin:4,lacticidven:4) )No results found for this or any previous visit (from the past 240 hour(s)).   Radiological Exams on Admission: No results found.  EKG: Not done  Assessment/Plan Principal Problem:   Symptomatic anemia Active Problems:   Elevated lipids   Essential hypertension   Coronary atherosclerosis   Congestive heart failure (HCC)   History of colonic polyps   Left carotid bruit   Symptomatic anemia -Patient with pallor, fatigue, DOE and confirmed Hgb 5.6-5.7. -Heme negative -Has h/o colon polyp as well as  chronic iron deficiency anemia -Labs today show microcytic anemia that is definitively consistent with iron deficiency - MCV quite low, <70; elevated RDW; ferritin 1 - diagnostic of iron deficiency -Patient has been consistently taking both Integra Plus and Niferex -She likely needs iron infusions at this point -Offered inpatient hematology consultation, but she prefers referral at discharge for outpatient evaluation -Will place in observation status, transfuse overnight, and plan for discharge in AM  HTN -Continue home meds - Coreg, Hyzaar  HLD -Continue Zocor  CAD -On ASA, Plavix -While this combination of medications does increase her bleeding risk, she is heme negative at this time -With current Hgb of almost 6 and 2 units, would anticipate that her post-transfusion Hgb will be in the 8 range -Hgb goal in these patients is often up to 10.  May need additional transfusion in the future - will defer to hematology/cardiology  CHF -No current evidence of volume overload, patient without symptoms (other than DOE, which is thought to be 2* to anemia) -Will monitor, may need Lasix between units  H/o colon polyps -Due to f/u c-scope this year -Encouraged to call GI to set up as an outpatient -Will continue Protonix (although no UGI bleed symptoms and here negative so will not increase to BID for now)  Left carotid bruit -Scheduled for carotid doppler tomorrow AM -Will call to reschedule for later this week -F/u with Dr. Johnsie Cancel  DVT prophylaxis: Early ambulation Code Status:  Full - confirmed with patient/family Family Communication: Husband present at bedside throughout evaluation Disposition Plan: Home once clinically improved, likely in AM Consults called: None - outpatient referral to hematology and patient also to call to schedule GI f/u and carotid doppler reschedule Admission status: Observation to Med Surg   Karmen Bongo MD [facility]  If 7PM-7AM, please  contact night-coverage [email or web address] Password [facility]  01/16/2016, 9:44 PM

## 2016-01-16 NOTE — ED Notes (Signed)
Attempted to call report

## 2016-01-17 ENCOUNTER — Inpatient Hospital Stay (HOSPITAL_COMMUNITY): Admission: RE | Admit: 2016-01-17 | Payer: BLUE CROSS/BLUE SHIELD | Source: Ambulatory Visit

## 2016-01-17 DIAGNOSIS — Z86010 Personal history of colonic polyps: Secondary | ICD-10-CM

## 2016-01-17 DIAGNOSIS — D649 Anemia, unspecified: Secondary | ICD-10-CM

## 2016-01-17 DIAGNOSIS — E785 Hyperlipidemia, unspecified: Secondary | ICD-10-CM

## 2016-01-17 DIAGNOSIS — I2510 Atherosclerotic heart disease of native coronary artery without angina pectoris: Secondary | ICD-10-CM

## 2016-01-17 DIAGNOSIS — R0989 Other specified symptoms and signs involving the circulatory and respiratory systems: Secondary | ICD-10-CM

## 2016-01-17 DIAGNOSIS — I10 Essential (primary) hypertension: Secondary | ICD-10-CM | POA: Diagnosis not present

## 2016-01-17 LAB — CBC
HEMATOCRIT: 28.4 % — AB (ref 36.0–46.0)
Hemoglobin: 8 g/dL — ABNORMAL LOW (ref 12.0–15.0)
MCH: 19.9 pg — ABNORMAL LOW (ref 26.0–34.0)
MCHC: 28.2 g/dL — AB (ref 30.0–36.0)
MCV: 70.6 fL — AB (ref 78.0–100.0)
Platelets: 351 10*3/uL (ref 150–400)
RBC: 4.02 MIL/uL (ref 3.87–5.11)
RDW: 24.6 % — AB (ref 11.5–15.5)
WBC: 6.4 10*3/uL (ref 4.0–10.5)

## 2016-01-17 LAB — BASIC METABOLIC PANEL
Anion gap: 7 (ref 5–15)
BUN: 5 mg/dL — AB (ref 6–20)
CHLORIDE: 109 mmol/L (ref 101–111)
CO2: 23 mmol/L (ref 22–32)
Calcium: 8.8 mg/dL — ABNORMAL LOW (ref 8.9–10.3)
Creatinine, Ser: 0.76 mg/dL (ref 0.44–1.00)
GFR calc Af Amer: >60 mL/min (ref >60)
GFR calc non Af Amer: >60 mL/min (ref >60)
GLUCOSE: 89 mg/dL (ref 65–99)
POTASSIUM: 3.7 mmol/L (ref 3.5–5.1)
Sodium: 139 mmol/L (ref 135–145)

## 2016-01-17 MED ORDER — SODIUM CHLORIDE 0.9 % IV SOLN
510.0000 mg | Freq: Once | INTRAVENOUS | Status: AC
  Administered 2016-01-17: 510 mg via INTRAVENOUS
  Filled 2016-01-17: qty 17

## 2016-01-17 NOTE — Progress Notes (Signed)
Discharge instructions and medications discussed with patient.  All questions answered.  

## 2016-01-17 NOTE — Discharge Summary (Signed)
Physician Discharge Summary  [name] [id number] DOB: Mar 24, 1951 DOA: 01/16/2016  PCP: Sheela Stack, MD  Admit date: 01/16/2016 Discharge date: 01/17/2016  Admitted From: Home Disposition: Home  Recommendations for Outpatient Follow-up:  1. Follow up with [facility] gastroenterology on 01/26/2016, stop aspirin. 2. Please obtain BMP/CBC in one week  Home Health: NA Equipment/Devices: NA  Discharge Condition: Stable CODE STATUS: Full Diet recommendation: Heart Healthy  Brief/Interim Summary: [name] is a 65 y.o. female with medical history significant of ASCVD, CHF, HTN,  HLD, and anemia presenting with symptomatic anemia.  Patient had a routine follow-up appointment with Dr. Johnsie Cancel today and he thought she looked pale and so checked labs.  Has been feeling weak/dizzy x 1 month.  No energy.  +DOE.  Hgb 5.6.  She needed a transfusion 2 years ago - but had a large polyp with GI bleeding at that time.  Does not think she she has seen blood in stools, but stools are very dark due to oral iron supplements.  No other bleeding sources, no periods.  Colonoscopy was 2 years ago with large polyp, repeat 1 year ago with 1 small polyp.  Next planned c-scope is sometime this year.  Discharge Diagnoses:  Principal Problem:   Symptomatic anemia Active Problems:   Elevated lipids   Essential hypertension   Coronary atherosclerosis   Congestive heart failure (HCC)   History of colonic polyps   Left carotid bruit   Symptomatic anemia, iron deficiency anemia -Patient with pallor, fatigue, DOE and confirmed Hgb 5.6-5.7. -Has h/o colon polyp as well as chronic iron deficiency anemia -Labs today showed by deficiency anemia with MCV of 64 and ferritin of 1. -Reported that she had dark stools or the time because of iron supplements. -Patient asked to be discharged today to follow as outpatient. -I personally called [facility] to schedule her an appointment on Friday 25th. -I spoke  with Dr. Johnsie Cancel prior to discharge, he is okay with discontinuing aspirin. She will be on Plavix. -Continue Protonix. Denies any overt bleeding.  HTN -Continue home meds - Coreg, Hyzaar  HLD -Continue Zocor  CAD -On ASA, Plavix -I spoke with cardiology, okay with discontinuing aspirin, continue Plavix.  CHF -No current evidence of volume overload, patient without symptoms (other than DOE, which is thought to be 2* to anemia) -Will monitor, may need Lasix between units  H/o colon polyps -Due to f/u c-scope this year -Follow-up with GI next Friday. -Will continue Protonix (although no UGI bleed symptoms and here negative so will not increase to BID for now)  Left carotid bruit -Scheduled for carotid doppler tomorrow AM, reschedule. -F/u with Dr. Johnsie Cancel   Discharge Instructions  Discharge Instructions    Diet - low sodium heart healthy    Complete by:  As directed   Increase activity slowly    Complete by:  As directed       Medication List    STOP taking these medications   aspirin 325 MG EC tablet     TAKE these medications   carvedilol 6.25 MG tablet Commonly known as:  COREG Take 6.25 mg by mouth daily.   clopidogrel 75 MG tablet Commonly known as:  PLAVIX TAKE ONE TABLET BY MOUTH  DAILY What changed:  See the new instructions.   EQUATE STOOL SOFTENER 100 MG capsule Generic drug:  docusate sodium Take 100 mg by mouth 2 (two) times daily.   iron polysaccharides 150 MG capsule Commonly known as:  NIFEREX Take 150  mg by mouth daily.   losartan-hydrochlorothiazide 50-12.5 MG tablet Commonly known as:  HYZAAR Take 1 tablet by mouth daily.   nitroGLYCERIN 0.4 MG SL tablet Commonly known as:  NITROSTAT Place 1 tablet (0.4 mg total) under the tongue every 5 (five) minutes as needed for chest pain.   pantoprazole 40 MG tablet Commonly known as:  PROTONIX TAKE 1 TABLET BY MOUTH EVERY DAY What changed:  See the new instructions.   simvastatin 40 MG  tablet Commonly known as:  ZOCOR Take 1 tablet (40 mg total) by mouth daily at 6 PM.       No Known Allergies  Consultations:  None, Specify Physician/Group   Procedures/Studies:  No results found. (Echo, Carotid, EGD, Colonoscopy, ERCP)    Subjective:   Discharge Exam: Vitals:   01/17/16 0456 01/17/16 0955  BP: (!) 147/54 137/63  Pulse: 77 79  Resp: 18 18  Temp: 98.7 F (37.1 C) 98.8 F (37.1 C)   Vitals:   01/17/16 0003 01/17/16 0153 01/17/16 0456 01/17/16 0955  BP: (!) 145/60 (!) 156/74 (!) 147/54 137/63  Pulse: 66 73 77 79  Resp: 16 18 18 18   Temp: 98.5 F (36.9 C) 98.8 F (37.1 C) 98.7 F (37.1 C) 98.8 F (37.1 C)  TempSrc: Oral Oral Oral Oral  SpO2: 99% 97% 97% 98%  Weight:      Height:        General: Pt is alert, awake, not in acute distress Cardiovascular: RRR, S1/S2 +, no rubs, no gallops Respiratory: CTA bilaterally, no wheezing, no rhonchi Abdominal: Soft, NT, ND, bowel sounds + Extremities: no edema, no cyanosis    The results of significant diagnostics from this hospitalization (including imaging, microbiology, ancillary and laboratory) are listed below for reference.     Microbiology: No results found for this or any previous visit (from the past 240 hour(s)).   Labs: BNP (last 3 results) No results for input(s): BNP in the last 8760 hours. Basic Metabolic Panel:  Recent Labs Lab 01/16/16 0855 01/16/16 1835 01/17/16 0543  NA 137 136 139  K 4.9 3.9 3.7  CL 104 107 109  CO2 24 24 23   GLUCOSE 102* 103* 89  BUN 10 8 5*  CREATININE 0.80 0.81 0.76  CALCIUM 9.0 8.7* 8.8*   Liver Function Tests:  Recent Labs Lab 01/16/16 1835  AST 18  ALT 15  ALKPHOS 58  BILITOT 0.3  PROT 7.3  ALBUMIN 3.8   No results for input(s): LIPASE, AMYLASE in the last 168 hours. No results for input(s): AMMONIA in the last 168 hours. CBC:  Recent Labs Lab 01/16/16 0855 01/16/16 1835 01/17/16 0543  WBC 7.4 6.1 6.4  NEUTROABS 5,032  3.4  --   HGB 5.6* 5.7* 8.0*  HCT 20.6* 22.3* 28.4*  MCV 61.7* 64.3* 70.6*  PLT 453* 443* 351   Cardiac Enzymes: No results for input(s): CKTOTAL, CKMB, CKMBINDEX, TROPONINI in the last 168 hours. BNP: Invalid input(s): POCBNP CBG: No results for input(s): GLUCAP in the last 168 hours. D-Dimer No results for input(s): DDIMER in the last 72 hours. Hgb A1c No results for input(s): HGBA1C in the last 72 hours. Lipid Profile No results for input(s): CHOL, HDL, LDLCALC, TRIG, CHOLHDL, LDLDIRECT in the last 72 hours. Thyroid function studies  Recent Labs  01/16/16 0855  TSH 2.75   Anemia work up  Recent Labs  01/16/16 2015  FERRITIN 1*  TIBC 452*  IRON 11*   Urinalysis No results found for: COLORURINE, APPEARANCEUR,  LABSPEC, PHURINE, GLUCOSEU, HGBUR, BILIRUBINUR, KETONESUR, PROTEINUR, UROBILINOGEN, NITRITE, LEUKOCYTESUR Sepsis Labs Invalid input(s): PROCALCITONIN,  WBC,  LACTICIDVEN Microbiology No results found for this or any previous visit (from the past 240 hour(s)).   Time coordinating discharge: Over 30 minutes  SIGNED:   Birdie Hopes, MD  [facility] 01/17/2016, 12:30 PM Pager   If 7PM-7AM, please contact night-coverage [email or web address] Password [email or web address]

## 2016-01-20 LAB — TYPE AND SCREEN
ABO/RH(D): A POS
ANTIBODY SCREEN: NEGATIVE
UNIT DIVISION: 0
Unit division: 0
Unit division: 0
Unit division: 0

## 2016-01-26 ENCOUNTER — Encounter: Payer: Self-pay | Admitting: Physician Assistant

## 2016-01-26 ENCOUNTER — Ambulatory Visit (INDEPENDENT_AMBULATORY_CARE_PROVIDER_SITE_OTHER): Payer: BLUE CROSS/BLUE SHIELD | Admitting: Physician Assistant

## 2016-01-26 ENCOUNTER — Other Ambulatory Visit: Payer: Self-pay | Admitting: Internal Medicine

## 2016-01-26 VITALS — BP 146/84 | HR 80 | Ht 63.5 in | Wt 147.0 lb

## 2016-01-26 DIAGNOSIS — Z86010 Personal history of colonic polyps: Secondary | ICD-10-CM | POA: Diagnosis not present

## 2016-01-26 DIAGNOSIS — D509 Iron deficiency anemia, unspecified: Secondary | ICD-10-CM

## 2016-01-26 DIAGNOSIS — Z7901 Long term (current) use of anticoagulants: Secondary | ICD-10-CM | POA: Diagnosis not present

## 2016-01-26 MED ORDER — NA SULFATE-K SULFATE-MG SULF 17.5-3.13-1.6 GM/177ML PO SOLN: 1.0000 | Freq: Once | ORAL | 0 refills | Status: AC

## 2016-01-26 NOTE — Patient Instructions (Signed)
Please go to the basement level to have your labs drawn.  We will call you once we hear from Dr. Jenkins Rouge regardinig the Plavix instructions and the Cardiac clearance for the Endoscopy and colonoscopy.  You have been scheduled for an endoscopy and colonoscopy. Please follow the written instructions given to you at your visit today. Please pick up your prep supplies at the pharmacy within the next 1-3 days. If you use inhalers (even only as needed), please bring them with you on the day of your procedure. Your physician has requested that you go to [email or web address] and enter the access code given to you at your visit today. This web site gives a general overview about your procedure. However, you should still follow specific instructions given to you by our office regarding your preparation for the procedure.

## 2016-01-26 NOTE — Progress Notes (Signed)
Chief Complaint: IDA, H/o Colon Polyps  HPI:  [name] is a 65 year old Caucasian female, with past medical history of MI 2, last in 2008 with stent placement, on chronic anticoagulation with Plavix, chronic congestive heart failure, last EF 08/16/13 of 55-60%, hyperlipidemia, hypertension, CAD, anemia and colon polyps,  who was referred to me by Reynold Bowen, MD for a complaint of iron deficiency anemia .  She typically follows with Dr. Henrene Pastor.  Independent review of patient's last colonoscopy report and images performed on 06/07/14 reveals prior scar from large polypectomy without obvious residual neoplasia, diminutive right colon polyp status post polypectomy and otherwise normal colonoscopy. Patient was told to repeat in one year. Pathology revealed a tubular adenoma in the cecum and benign colonic mucosa in the ascending colon.  Independent review of patient's last EGD report and images from 01/05/14, shows normal esophagus, a few antral erosions and otherwise normal stomach. Nonerosive duodenitis and otherwise normal exam.    Patient was recently seen in the hospital, admitted 01/16/16 and discharged on 01/17/16 for symptomatic anemia. At time of admission patient was found to have a hemoglobin of 5.6. She did have a transfusion of 2 units of PRBCs and began to feel much better. She was discharged on Protonix and told to follow with our clinic for further evaluation. Hemoglobin at time of discharge had increased to 8.0. Iron labs at that time showed an iron low at 11, TIBC high at 452 and a ferritin of 1. Patient was also started on oral iron supplementation.  Today, the patient presents to clinic accompanied by her husband. She explains that since time of discharge she has had no further symptoms of anemia, in fact she has been able to return to all of her normal activities. She does describe a history of anemia a few years ago and a finding of a large polyp at that time which was thought to have  been the culprit. The patient has continued to take her Pantoprazole 40 mg once daily as prescribed after hospital visit.  Patient denies fever, chills, blood in her stool, melena, change in bowel habits, weight loss, fatigue, anorexia, nausea, vomiting or abdominal pain.   Past Medical History:  Diagnosis Date  . ABDOMINAL AORTIC ANEURYSM   . Anemia   . CAD   . CONGESTIVE HEART FAILURE, SYSTOLIC, CHRONIC   . CT, CHEST, ABNORMAL   . History of colon polyps 01/05/2014   adenomatous polyps  . HYPERLIPIDEMIA   . HYPERTENSION   . ISCHEMIC CARDIOMYOPATHY   . Lower GI bleed 01/2014  . 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 TOBACCO ABUSE     Past Surgical History:  Procedure Laterality Date  . bare metal stent placement     left anterior descending artery  . COLONOSCOPY N/A 01/05/2014   Procedure: COLONOSCOPY;  Surgeon: Jerene Bears, MD;  Location: [facility];  Service: Endoscopy;  Laterality: N/A;  . COLONOSCOPY N/A 01/06/2014   Procedure: COLONOSCOPY;  Surgeon: Jerene Bears, MD;  Location: [facility];  Service: Endoscopy;  Laterality: N/A;    Current Outpatient Prescriptions  Medication Sig Dispense Refill  . carvedilol (COREG) 6.25 MG tablet Take 6.25 mg by mouth daily.    . clopidogrel (PLAVIX) 75 MG tablet TAKE ONE TABLET BY MOUTH  DAILY (Patient taking differently: TAKE ONE TABLET (75 mg) BY MOUTH  DAILY) 30 tablet 8  . docusate sodium (EQUATE STOOL SOFTENER) 100  MG capsule Take 100 mg by mouth 2 (two) times daily.    . iron polysaccharides (NIFEREX) 150 MG capsule Take 150 mg by mouth daily.    Marland Kitchen losartan-hydrochlorothiazide (HYZAAR) 50-12.5 MG per tablet Take 1 tablet by mouth daily.     . pantoprazole (PROTONIX) 40 MG tablet TAKE 1 TABLET BY MOUTH EVERY DAY (Patient taking differently: TAKE 1 TABLET (40 mg) BY MOUTH EVERY DAY) 30 tablet 3  . simvastatin (ZOCOR) 40 MG tablet Take 1 tablet (40 mg total) by mouth  daily at 6 PM. 30 tablet 11  . nitroGLYCERIN (NITROSTAT) 0.4 MG SL tablet Place 1 tablet (0.4 mg total) under the tongue every 5 (five) minutes as needed for chest pain. (Patient not taking: Reported on 01/26/2016) 25 tablet 3   No current facility-administered medications for this visit.     Allergies as of 01/26/2016  . (No Known Allergies)    Family History  Problem Relation Age of Onset  . Lymphoma Mother 57  . Colon polyps Father   . Depression Father 40    died of suicide  . Diabetes Maternal Grandmother   . Anemia Sister   . Colon polyps Sister   . Lung cancer Sister   . Colon cancer Neg Hx     Social History   Social History  . Marital status: Married    Spouse name: Clair Gulling  . Number of children: 0  . Years of education: 9th   Occupational History  . retired    Social History Main Topics  . Smoking status: Former Smoker    Types: Cigarettes    Quit date: 06/26/2012  . Smokeless tobacco: Never Used  . Alcohol use Yes     Comment: 3-4 drinks/week  . Drug use: No  . Sexual activity: Not on file   Other Topics Concern  . Not on file   Social History Narrative  . No narrative on file    Review of Systems:    Constitutional: No weight loss, fever, chills, weakness or fatigue HEENT: Eyes: No Change in vision                            Ears, Nose, Throat:  No  change in hearing or congestion Skin: No rash or itching Cardiovascular: No chest pain, chest pressure or  palpitations     Respiratory: No Sor cough Gastrointestinal: See HPI and otherwise negative Genitourinary: No dysuria or change in urinary frequency Neurological: No headache, dor syncope Musculoskeletal: No  new muscle or back pain Hematologic: Positive for iron deficiency anemia No Bleeding Psychiatric: No history of depression or anxiety   Physical Exam:  Vital signs: BP (!) 146/84 (BP Location: Left Arm, Patient Position: Sitting, Cuff Size: Normal)   Pulse 80   Ht 5' 3.5" (1.613 m)   Wt  147 lb (66.7 kg)   BMI 25.63 kg/m   General:   Pleasant  Caucasian female appears to be in NAD, Well developed, Well nourished, alert and cooperative Head:  Normocephalic and atraumatic. Eyes:   PEERL, EOMI. No icterus. Conjunctiva pink. Ears:  Normal auditory acuity. Neck:  Supple Throat: Oral cavity and pharynx without inflammation, swelling or lesion.  Lungs: Respirations even and unlabored. Lungs clear to auscultation bilaterally.   No wheezes, crackles, or rhonchi.  Heart: Normal S1, S2. No MRG. Regular rate and rhythm. No peripheral edema, cyanosis or pallor.  Abdomen:  Soft, nondistended, nontender. No rebound or guarding.  Normal bowel sounds. No appreciable masses or hepatomegaly. Rectal:  Not performed.  Msk:  Symmetrical without gross deformities.  Extremities:  Without edema, no deformity or joint abnormality.  Neurologic:  Alert and  oriented x4;  grossly normal neurologically.  Skin:   Dry and intact without significant lesions or rashes. Psychiatric: Oriented to person, place and time. Demonstrates good judgement and reason without abnormal affect or behaviors.  RELEVANT LABS AND IMAGING: CBC    Component Value Date/Time   WBC 6.4 01/17/2016 0543   RBC 4.02 01/17/2016 0543   HGB 8.0 (L) 01/17/2016 0543   HCT 28.4 (L) 01/17/2016 0543   PLT 351 01/17/2016 0543   MCV 70.6 (L) 01/17/2016 0543   MCH 19.9 (L) 01/17/2016 0543   MCHC 28.2 (L) 01/17/2016 0543   RDW 24.6 (H) 01/17/2016 0543   LYMPHSABS 1.8 01/16/2016 1835   MONOABS 0.6 01/16/2016 1835   EOSABS 0.2 01/16/2016 1835   BASOSABS 0.1 01/16/2016 1835    CMP     Component Value Date/Time   NA 139 01/17/2016 0543   K 3.7 01/17/2016 0543   CL 109 01/17/2016 0543   CO2 23 01/17/2016 0543   GLUCOSE 89 01/17/2016 0543   BUN 5 (L) 01/17/2016 0543   CREATININE 0.76 01/17/2016 0543   CREATININE 0.80 01/16/2016 0855   CALCIUM 8.8 (L) 01/17/2016 0543   PROT 7.3 01/16/2016 1835   ALBUMIN 3.8 01/16/2016 1835    AST 18 01/16/2016 1835   ALT 15 01/16/2016 1835   ALKPHOS 58 01/16/2016 1835   BILITOT 0.3 01/16/2016 1835   GFRNONAA >60 01/17/2016 0543   GFRAA >60 01/17/2016 0543    Assessment: 1. Iron deficiency anemia: Recent admission on the 15th of this month for symptomatic anemia, hemoglobin was found to be around 5, after 2 units PRBCs increased to around 8, patient has continued on iron supplementation and denies any symptoms since time of discharge on the 16th; consider gastritis versus PUD versus celiac disease versus AVM versus polyps versus other 2. History of adenomatous polyps: Last colonoscopy January 2016 with recommendations for repeat in 1 year 3. Chronic anticoagulation: On plavix for MI and stent hx  Plan: 1.  Recommend patient proceed with an EGD and colonoscopy for further evaluation. Discussed risks, benefits, limitations and alternatives to these procedures and the patient agrees to proceed. 2. Recommend the patient continue her iron supplement as prescribed. 3. Ordered repeat BMP and CBC today. 4. Patient should continue her Pantoprazole 40 mg once daily until after time of EGD. She can await further recommendations at that time 5. Patient does tell me she is to be scheduled for coronary artery evaluation as she was recently found to have a murmur in this area. At this time patient was advised to hold her Plavix for 5 days prior to her colonoscopy and EGD. We will communicate with her cardiologist Dr. Johnsie Cancel to ensure that holding her Plavix is acceptable. Certainly due to her recent finding of carotid artery disease. May need to await procedures until after this is fully worked up. 6. Patient to follow with Dr. Henrene Pastor per his recommendations after time procedure  Ellouise Newer, PA-C [facility] 01/26/2016, 11:05 AM  Cc: Reynold Bowen, MD

## 2016-01-29 NOTE — Progress Notes (Signed)
Agree with initial assessment and plans as outlined 

## 2016-01-30 ENCOUNTER — Ambulatory Visit (HOSPITAL_COMMUNITY)
Admission: RE | Admit: 2016-01-30 | Discharge: 2016-01-30 | Disposition: A | Discharge status: Home / Self Care | Payer: BLUE CROSS/BLUE SHIELD | Source: Ambulatory Visit | Attending: Cardiovascular Disease | Admitting: Cardiovascular Disease

## 2016-01-30 DIAGNOSIS — R0989 Other specified symptoms and signs involving the circulatory and respiratory systems: Secondary | ICD-10-CM | POA: Insufficient documentation

## 2016-01-30 DIAGNOSIS — Z87891 Personal history of nicotine dependence: Secondary | ICD-10-CM | POA: Insufficient documentation

## 2016-01-30 DIAGNOSIS — I6523 Occlusion and stenosis of bilateral carotid arteries: Secondary | ICD-10-CM | POA: Insufficient documentation

## 2016-01-30 DIAGNOSIS — I2510 Atherosclerotic heart disease of native coronary artery without angina pectoris: Secondary | ICD-10-CM | POA: Insufficient documentation

## 2016-01-30 DIAGNOSIS — I10 Essential (primary) hypertension: Secondary | ICD-10-CM | POA: Insufficient documentation

## 2016-02-02 ENCOUNTER — Telehealth: Payer: Self-pay

## 2016-02-02 NOTE — Telephone Encounter (Signed)
Initiated prior authorization for Pantoprazole through Cover My Meds.

## 2016-02-06 ENCOUNTER — Encounter: Payer: Self-pay | Admitting: Internal Medicine

## 2016-02-16 ENCOUNTER — Other Ambulatory Visit: Payer: Self-pay | Admitting: *Deleted

## 2016-02-16 ENCOUNTER — Telehealth: Payer: Self-pay | Admitting: *Deleted

## 2016-02-16 ENCOUNTER — Other Ambulatory Visit (INDEPENDENT_AMBULATORY_CARE_PROVIDER_SITE_OTHER): Payer: BLUE CROSS/BLUE SHIELD

## 2016-02-16 ENCOUNTER — Telehealth: Payer: Self-pay | Admitting: Cardiovascular Disease

## 2016-02-16 DIAGNOSIS — D509 Iron deficiency anemia, unspecified: Secondary | ICD-10-CM | POA: Diagnosis not present

## 2016-02-16 DIAGNOSIS — Z86010 Personal history of colonic polyps: Secondary | ICD-10-CM | POA: Diagnosis not present

## 2016-02-16 LAB — BASIC METABOLIC PANEL
BUN: 9 mg/dL (ref 6–23)
CHLORIDE: 104 meq/L (ref 96–112)
CO2: 29 mEq/L (ref 19–32)
Calcium: 9.2 mg/dL (ref 8.4–10.5)
Creatinine, Ser: 0.76 mg/dL (ref 0.40–1.20)
GFR: 81.42 mL/min (ref >60.00)
Glucose, Bld: 105 mg/dL — ABNORMAL HIGH (ref 70–99)
POTASSIUM: 4.7 meq/L (ref 3.5–5.1)
SODIUM: 141 meq/L (ref 135–145)

## 2016-02-16 LAB — CBC WITH DIFFERENTIAL/PLATELET
BASOS ABS: 0 10*3/uL (ref 0.0–0.1)
Basophils Relative: 0.3 % (ref 0.0–3.0)
EOS ABS: 0.2 10*3/uL (ref 0.0–0.7)
Eosinophils Relative: 2.3 % (ref 0.0–5.0)
HEMATOCRIT: 38.1 % (ref 36.0–46.0)
HEMOGLOBIN: 12.6 g/dL (ref 12.0–15.0)
LYMPHS PCT: 25.7 % (ref 12.0–46.0)
Lymphs Abs: 1.9 10*3/uL (ref 0.7–4.0)
MCHC: 33 g/dL (ref 30.0–36.0)
MCV: 79.4 fl (ref 78.0–100.0)
MONO ABS: 0.6 10*3/uL (ref 0.1–1.0)
Monocytes Relative: 8.2 % (ref 3.0–12.0)
NEUTROS ABS: 4.6 10*3/uL (ref 1.4–7.7)
Neutrophils Relative %: 63.5 % (ref 43.0–77.0)
PLATELETS: 267 10*3/uL (ref 150.0–400.0)
RBC: 4.8 Mil/uL (ref 3.87–5.11)
RDW: 36.4 % — ABNORMAL HIGH (ref 11.5–15.5)
WBC: 7.3 10*3/uL (ref 4.0–10.5)

## 2016-02-16 NOTE — Telephone Encounter (Signed)
Destiny Juniper, RN from Dr. Kyla Balzarine office called me back. Since Dr. Johnsie Cancel was out of the office , Dr.Christopher Angelena Form looked at the patient's chart. He noted that she had intervention in 2012.  Also he most recent labs were good, HGB/Hemaocrit, AIC.  He said since she has been off the Plavix since the 11th, he said it was fine for her to have the procedure. On Monday 9-18.  She can resume the Plavix on Tuesday 9-19.

## 2016-02-16 NOTE — Telephone Encounter (Signed)
Pam with [facility] called to inform our office patient decided to stop her Plavix on 02/12/16 for her colonoscopy and endoscopy on 02/19/16. Consulted DOD, Dr. Angelena Form, he states that patient is okay to stop Plavix for procedure, since patient's last intervention was in 2008. Called Pam back and informed her of Dr. Camillia Herter recommendation.

## 2016-02-16 NOTE — Telephone Encounter (Signed)
New message    [name] calling stating that the pt stopped taking Plavix on 9/11 and is having a procedure on the 18th (colonscopy and endoscopy).

## 2016-02-16 NOTE — Telephone Encounter (Signed)
Called the patient to ask if she talked to anyone at Dr. Kyla Balzarine office about her Plavix.  She said she called them Monday the 11th and left a message for them to call her back.  She said no one did so she stopped the Plavix on Monday 02-12-2016 along with her iron supplement.  I aplogized to her that I neglected to send the anticoagulation letter to Dr. Johnsie Cancel.  I did tell her to let them know and ask if she can resume the Plavix on the day after the procedure.  Having endo/colon on 02-19-2016.  I asked if she was having any rectal bleeding or saw blood in her stools and she said no.

## 2016-02-16 NOTE — Telephone Encounter (Signed)
[place] for Ronnette Juniper, RN, ( Dr. Kyla Balzarine nurse). Advised that I neglected to send Dr. Johnsie Cancel the anticoagulation letter asking for Plavix clearance . The procedure is Monday 9-18. The patient stopped the Plavix on her own on Monday 9-11.  Wanted Dr. Johnsie Cancel to know and asked if he thinks she should be okay to have the procedures on Monday 9-18.

## 2016-02-19 ENCOUNTER — Ambulatory Visit (AMBULATORY_SURGERY_CENTER): Payer: BLUE CROSS/BLUE SHIELD | Admitting: Internal Medicine

## 2016-02-19 ENCOUNTER — Encounter: Payer: Self-pay | Admitting: Internal Medicine

## 2016-02-19 VITALS — BP 158/74 | HR 71 | Temp 98.4°F | Resp 14 | Ht 63.5 in | Wt 147.0 lb

## 2016-02-19 DIAGNOSIS — D125 Benign neoplasm of sigmoid colon: Secondary | ICD-10-CM

## 2016-02-19 DIAGNOSIS — D120 Benign neoplasm of cecum: Secondary | ICD-10-CM

## 2016-02-19 DIAGNOSIS — D509 Iron deficiency anemia, unspecified: Secondary | ICD-10-CM | POA: Diagnosis not present

## 2016-02-19 DIAGNOSIS — D122 Benign neoplasm of ascending colon: Secondary | ICD-10-CM | POA: Diagnosis not present

## 2016-02-19 DIAGNOSIS — Z86010 Personal history of colonic polyps: Secondary | ICD-10-CM | POA: Diagnosis not present

## 2016-02-19 DIAGNOSIS — D123 Benign neoplasm of transverse colon: Secondary | ICD-10-CM

## 2016-02-19 MED ORDER — SODIUM CHLORIDE 0.9 % IV SOLN: 500.0000 mL | INTRAVENOUS | Status: DC

## 2016-02-19 NOTE — Progress Notes (Signed)
Pt. Took nitro on 02/12/11 she stated that she was not hurting,but she just didn't feel right and she stated that her heart doctor told her anytime she feels like that just go ahead and take a nitroglycerin tablet, made doctor/ CRNA aware.

## 2016-02-19 NOTE — Patient Instructions (Signed)
YOU HAD AN ENDOSCOPIC PROCEDURE TODAY AT [facility]:   Refer to the procedure report that was given to you for any specific questions about what was found during the examination.  If the procedure report does not answer your questions, please call your gastroenterologist to clarify.  If you requested that your care partner not be given the details of your procedure findings, then the procedure report has been included in a sealed envelope for you to review at your convenience later.  YOU SHOULD EXPECT: Some feelings of bloating in the abdomen. Passage of more gas than usual.  Walking can help get rid of the air that was put into your GI tract during the procedure and reduce the bloating. If you had a lower endoscopy (such as a colonoscopy or flexible sigmoidoscopy) you may notice spotting of blood in your stool or on the toilet paper. If you underwent a bowel prep for your procedure, you may not have a normal bowel movement for a few days.  Please Note:  You might notice some irritation and congestion in your nose or some drainage.  This is from the oxygen used during your procedure.  There is no need for concern and it should clear up in a day or so.  SYMPTOMS TO REPORT IMMEDIATELY:   Following lower endoscopy (colonoscopy or flexible sigmoidoscopy):  Excessive amounts of blood in the stool  Significant tenderness or worsening of abdominal pains  Swelling of the abdomen that is new, acute  Fever of 100F or higher   Following upper endoscopy (EGD)  Vomiting of blood or coffee ground material  New chest pain or pain under the shoulder blades  Painful or persistently difficult swallowing  New shortness of breath  Fever of 100F or higher  Black, tarry-looking stools  For urgent or emergent issues, a gastroenterologist can be reached at any hour by calling [phone].   DIET:  We do recommend a small meal at first, but then you may proceed to your regular diet.  Drink  plenty of fluids but you should avoid alcoholic beverages for 24 hours.  ACTIVITY:  You should plan to take it easy for the rest of today and you should NOT DRIVE or use heavy machinery until tomorrow (because of the sedation medicines used during the test).    FOLLOW UP: Our staff will call the number listed on your records the next business day following your procedure to check on you and address any questions or concerns that you may have regarding the information given to you following your procedure. If we do not reach you, we will leave a message.  However, if you are feeling well and you are not experiencing any problems, there is no need to return our call.  We will assume that you have returned to your regular daily activities without incident.  If any biopsies were taken you will be contacted by phone or by letter within the next 1-3 weeks.  Please call us at [phone] if you have not heard about the biopsies in 3 weeks.    SIGNATURES/CONFIDENTIALITY: You and/or your care partner have signed paperwork which will be entered into your electronic medical record.  These signatures attest to the fact that that the information above on your After Visit Summary has been reviewed and is understood.  Full responsibility of the confidentiality of this discharge information lies with you and/or your care-partner.  Polyps-handout given  Repeat colonoscopy in 6 months.  Resume plavix  tomorrow at prior dose.  CBC this week.

## 2016-02-19 NOTE — Op Note (Signed)
[facility] Patient Name: [name] Procedure Date: 02/19/2016 4:10 PM MRN: [id number] Endoscopist: Docia Chuck. Henrene Pastor , MD Age: 65 Referring MD:  Date of Birth: 1951/01/03 Gender: Female Account #: [id number] Procedure:                Upper GI endoscopy, with biopsies Indications:              Iron deficiency anemia Medicines:                Monitored Anesthesia Care Procedure:                Pre-Anesthesia Assessment:                           - Prior to the procedure, a History and Physical                            was performed, and patient medications and                            allergies were reviewed. The patient's tolerance of                            previous anesthesia was also reviewed. The risks                            and benefits of the procedure and the sedation                            options and risks were discussed with the patient.                            All questions were answered, and informed consent                            was obtained. Prior Anticoagulants: The patient has                            taken Plavix (clopidogrel), last dose was 7 days                            prior to procedure. ASA Grade Assessment: III - A                            patient with severe systemic disease. After                            reviewing the risks and benefits, the patient was                            deemed in satisfactory condition to undergo the                            procedure.  After obtaining informed consent, the endoscope was                            passed under direct vision. Throughout the                            procedure, the patient's blood pressure, pulse, and                            oxygen saturations were monitored continuously. The                            Model GIF-HQ190 [id number]) scope was introduced                            through the mouth, and advanced to the third part                           of duodenum. The upper GI endoscopy was                            accomplished without difficulty. The patient                            tolerated the procedure well. Scope In: Scope Out: Findings:                 The esophagus was normal.                           The stomach was normal.                           The examined duodenum was normal. Biopsies for                            histology were taken with a cold forceps for                            evaluation of celiac disease.                           The cardia and gastric fundus were normal on                            retroflexion. Complications:            No immediate complications. Estimated Blood Loss:     Estimated blood loss: none. Impression:               - Normal esophagus.                           - Normal stomach.                           - Normal examined duodenum. Biopsied. Recommendation:           -  CBC this week. May need increase iron from once                            daily.                           - Resume previous diet.                           - Continue present medications.                           - Await pathology results.                           - Resume Plavix (clopidogrel) at prior dose                            tomorrow. Docia Chuck. Henrene Pastor, MD 02/19/2016 5:07:39 PM This report has been signed electronically.

## 2016-02-19 NOTE — Op Note (Signed)
[facility] Patient Name: [name] Procedure Date: 02/19/2016 4:10 PM MRN: [id number] Endoscopist: Docia Chuck. Henrene Pastor , MD Age: 65 Referring MD:  Date of Birth: 1950/06/26 Gender: Female Account #: [id number] Procedure:                Colonoscopy, with submucosal injection of saline                            and cold snare polypectomy X8 Indications:              Surveillance: Piecemeal removal of large sessile                            adenoma last colonoscopy (< 3 yrs), High risk colon                            cancer surveillance: Personal history of adenoma                            (10 mm or greater in size), High risk colon cancer                            surveillance: Personal history of multiple (3 or                            more) adenomas Medicines:                Monitored Anesthesia Care Procedure:                Pre-Anesthesia Assessment:                           - Prior to the procedure, a History and Physical                            was performed, and patient medications and                            allergies were reviewed. The patient's tolerance of                            previous anesthesia was also reviewed. The risks                            and benefits of the procedure and the sedation                            options and risks were discussed with the patient.                            All questions were answered, and informed consent                            was obtained. Prior Anticoagulants: The patient has  taken Plavix (clopidogrel), last dose was 7 days                            prior to procedure. ASA Grade Assessment: III - A                            patient with severe systemic disease. After                            reviewing the risks and benefits, the patient was                            deemed in satisfactory condition to undergo the                            procedure.              After obtaining informed consent, the colonoscope                            was passed under direct vision. Throughout the                            procedure, the patient's blood pressure, pulse, and                            oxygen saturations were monitored continuously. The                            Model CF-HQ190L [id number] scope was introduced                            through the anus and advanced to the the cecum,                            identified by appendiceal orifice and ileocecal                            valve. The ileocecal valve, appendiceal orifice,                            and rectum were photographed. The quality of the                            bowel preparation was excellent. The colonoscopy                            was performed without difficulty. The patient                            tolerated the procedure well. The bowel preparation                            used was SUPREP. Scope In: 4:16:35 PM Scope Out:  4:44:17 PM Scope Withdrawal Time: 0 hours 23 minutes 41 seconds  Total Procedure Duration: 0 hours 27 minutes 42 seconds  Findings:                 Eight sessile polyps were found in the sigmoid                            colon, transverse colon, hepatic flexure, ascending                            colon and cecum. The polyps were 3 to 15 mm in                            size. Two polyps measuring 15 mm in diameter were                            removed with a saline injection-lift technique                            using a cold snare. Resection and retrieval were                            complete.                           Multiple diverticula were found in the sigmoid                            colon.                           The exam was otherwise without abnormality on                            direct and retroflexion views. Complications:            No immediate complications. Estimated blood loss:                             None. Estimated Blood Loss:     Estimated blood loss: none. Impression:               - Eight 3 to 15 mm polyps in the transverse colon,                            at the hepatic flexure, in the ascending colon and                            in the cecum, removed using injection-lift and a                            cold snare. Resected and retrieved. Others removed                            with cold snare only. There was some residual  polypoid tissue at the prior cecal polypectomy                            resection site. This tissue was removed with cold                            snare. There was mild melanosis coli. 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                           - Diverticulosis in the sigmoid colon.                           - The examination was otherwise normal on direct                            and retroflexion views. Recommendation:           - Repeat colonoscopy in 6 MONTHS for surveillance.                           - Resume Plavix (clopidogrel) tomorrow at prior                            dose.                           - CBC as week. May need to increase iron from once                            daily.                           - EGD today. Please see report.                           - Continue present medications.                           - Await pathology results. Docia Chuck. Henrene Pastor, MD 02/19/2016 5:01:37 PM This report has been signed electronically.

## 2016-02-19 NOTE — Progress Notes (Signed)
Report to PACU, RN, vss, BBS= Clear.  

## 2016-02-20 ENCOUNTER — Other Ambulatory Visit: Payer: Self-pay

## 2016-02-20 ENCOUNTER — Other Ambulatory Visit (INDEPENDENT_AMBULATORY_CARE_PROVIDER_SITE_OTHER): Payer: BLUE CROSS/BLUE SHIELD

## 2016-02-20 ENCOUNTER — Telehealth: Payer: Self-pay

## 2016-02-20 DIAGNOSIS — D62 Acute posthemorrhagic anemia: Secondary | ICD-10-CM | POA: Diagnosis not present

## 2016-02-20 DIAGNOSIS — D509 Iron deficiency anemia, unspecified: Secondary | ICD-10-CM

## 2016-02-20 LAB — CBC WITH DIFFERENTIAL/PLATELET
BASOS ABS: 0 10*3/uL (ref 0.0–0.1)
BASOS PCT: 0.4 % (ref 0.0–3.0)
Eosinophils Absolute: 0.1 10*3/uL (ref 0.0–0.7)
Eosinophils Relative: 1.6 % (ref 0.0–5.0)
HEMATOCRIT: 36.7 % (ref 36.0–46.0)
HEMOGLOBIN: 12.3 g/dL (ref 12.0–15.0)
LYMPHS PCT: 21.4 % (ref 12.0–46.0)
Lymphs Abs: 1.9 10*3/uL (ref 0.7–4.0)
MCHC: 33.5 g/dL (ref 30.0–36.0)
MCV: 79.5 fl (ref 78.0–100.0)
MONOS PCT: 9.5 % (ref 3.0–12.0)
Monocytes Absolute: 0.8 10*3/uL (ref 0.1–1.0)
NEUTROS ABS: 5.8 10*3/uL (ref 1.4–7.7)
Neutrophils Relative %: 67.1 % (ref 43.0–77.0)
PLATELETS: 233 10*3/uL (ref 150.0–400.0)
RBC: 4.61 Mil/uL (ref 3.87–5.11)
RDW: 35.5 % — AB (ref 11.5–15.5)
WBC: 8.7 10*3/uL (ref 4.0–10.5)

## 2016-02-20 NOTE — Telephone Encounter (Signed)
  Follow up Call-  Call back number 02/19/2016 06/07/2014 01/05/2014  Post procedure Call Back phone  # [phone]  Permission to leave phone message Yes Yes Yes  Some recent data might be hidden    Patient was called for follow up after her procedure on 02/19/2016. I spoke with [name]'s husband and he reports that she has returned to her normal daily activities without any difficulty.

## 2016-02-22 ENCOUNTER — Encounter: Payer: Self-pay | Admitting: Internal Medicine

## 2016-04-12 ENCOUNTER — Other Ambulatory Visit: Payer: Self-pay | Admitting: Cardiovascular Disease

## 2016-05-20 ENCOUNTER — Other Ambulatory Visit (INDEPENDENT_AMBULATORY_CARE_PROVIDER_SITE_OTHER): Payer: BLUE CROSS/BLUE SHIELD

## 2016-05-20 DIAGNOSIS — D509 Iron deficiency anemia, unspecified: Secondary | ICD-10-CM

## 2016-05-20 LAB — CBC WITH DIFFERENTIAL/PLATELET
BASOS ABS: 0.1 10*3/uL (ref 0.0–0.1)
Basophils Relative: 0.7 % (ref 0.0–3.0)
EOS ABS: 0.2 10*3/uL (ref 0.0–0.7)
Eosinophils Relative: 2.2 % (ref 0.0–5.0)
HEMATOCRIT: 38.8 % (ref 36.0–46.0)
HEMOGLOBIN: 13.2 g/dL (ref 12.0–15.0)
LYMPHS PCT: 26.9 % (ref 12.0–46.0)
Lymphs Abs: 2 10*3/uL (ref 0.7–4.0)
MCHC: 34 g/dL (ref 30.0–36.0)
MCV: 90 fl (ref 78.0–100.0)
MONOS PCT: 9.9 % (ref 3.0–12.0)
Monocytes Absolute: 0.7 10*3/uL (ref 0.1–1.0)
Neutro Abs: 4.4 10*3/uL (ref 1.4–7.7)
Neutrophils Relative %: 60.3 % (ref 43.0–77.0)
Platelets: 252 10*3/uL (ref 150.0–400.0)
RBC: 4.31 Mil/uL (ref 3.87–5.11)
RDW: 13.4 % (ref 11.5–15.5)
WBC: 7.3 10*3/uL (ref 4.0–10.5)

## 2016-07-15 NOTE — Progress Notes (Signed)
Patient ID: JEENA MANU, female   DOB: Mar 17, 1951, 66 y.o.   MRN: [id number]   Orlene is seen today in followup for coronary artery disease. She has a distant history of anterior wall MI 1998. She had a bare-metal stent. She had reintervention in 2008. 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   Quit smoking   Taking coreg once daily bid makes her fatigued Restarted at lower dose and tolerating Hyzaar caused some dizzyness  BP running fine at home records reviewed   Echo 08/16/13 Normal EF  Study Conclusions  - Left ventricle: The cavity size was normal. Wall thickness was normal. Systolic function was normal. The estimated ejection fraction was in the range of 55% to 60%. Wall motion was normal; there were no regional wall motion abnormalities. Doppler parameters are consistent with abnormal left ventricular relaxation (grade 1 diastolic dysfunction). The E/e' ratio is <10, suggesting normal LV filling pressure. - Mitral valve: Calcified annulus. Mild regurgitation. - Left atrium: The atrium was at the upper limits of normal in size. - Tricuspid valve: Mild regurgitation. - Pulmonary arteries: PA peak pressure: 52mm Hg (S). - Inferior vena cava: The vessel was normal in size; the respirophasic diameter changes were in the normal range (= 50%); findings are consistent with normal central venous pressure.   Seeing Dr Henrene Pastor for anemia  Had bleeding polyps removed     Husband [name] has DCM sees DB and had renal cell carcinoma post nephrectomy now  Recent gluteal abscess and sister in [facility] has lung cancer   BP ok at home typically higher  At doctors office   ROS: Denies fever, malais, weight loss, blurry vision, decreased visual acuity, cough, sputum, SOB, hemoptysis, pleuritic pain, palpitaitons, heartburn, abdominal pain, melena, lower extremity edema, claudication, or rash.  All other systems reviewed and negative  General: Affect  appropriate Chronically ill white female  Pale  HEENT: normal Neck supple with no adenopathy JVP normal left bruits no thyromegaly Lungs clear with no wheezing and good diaphragmatic motion Heart:  S1/S2 no murmur, no rub, gallop or click PMI normal Abdomen: benighn, BS positve, no tenderness, no AAA no bruit.  No HSM or HJR Distal pulses intact with no bruits No edema Neuro non-focal Skin warm and dry No muscular weakness   Current Outpatient Prescriptions  Medication Sig Dispense Refill  . carvedilol (COREG) 6.25 MG tablet Take 1 tablet (6.25 mg total) by mouth 2 (two) times daily with a meal. 180 tablet 3  . clopidogrel (PLAVIX) 75 MG tablet TAKE 1 TABLET BY MOUTH EVERY DAY 30 tablet 8  . docusate sodium (EQUATE STOOL SOFTENER) 100 MG capsule Take 100 mg by mouth 2 (two) times daily.    . iron polysaccharides (NIFEREX) 150 MG capsule Take 150 mg by mouth daily.    Marland Kitchen losartan-hydrochlorothiazide (HYZAAR) 50-12.5 MG tablet Take 1 tablet by mouth daily. 90 tablet 3  . nitroGLYCERIN (NITROSTAT) 0.4 MG SL tablet Place 1 tablet (0.4 mg total) under the tongue every 5 (five) minutes as needed for chest pain. 25 tablet 2  . pantoprazole (PROTONIX) 40 MG tablet TAKE 1 TABLET BY MOUTH EVERY DAY 30 tablet 3  . simvastatin (ZOCOR) 40 MG tablet Take 1 tablet (40 mg total) by mouth daily at 6 PM. 90 tablet 3   Current Facility-Administered Medications  Medication Dose Route Frequency Provider Last Rate Last Dose  . 0.9 %  sodium chloride infusion  500 mL Intravenous Continuous Irene Shipper,  MD        Allergies  Patient has no known allergies.  Electrocardiogram: SR 89  ICRBBB 10/20/13    06/14/14  No change SR 79 ICRBBB   07/03/15  SR rate 83 normal 07/25/16  SR rate 74  ICRBBB  Assessment and Plan  CAD: Stable with no angina and good activity level.  Continue medical Rx New nitro called in LAD stent in 49 with re intervention 2008   HTN:  Well controlled.  Continue current medications  and low sodium Dash type diet.  Always higher in office home readings fine   Chol:  Labs with Dr Forde Dandy  Cholesterol is at goal.  Continue current dose of statin and diet Rx.  No myalgias or side effects.  F/U  LFT's in 6 months. Lab Results  Component Value Date   LDLCALC 80 07/28/2013          Anemia:  On iron improved no bleeding  02/19/16 EGD normal colonoscopy with removal of 8 polyps f/u Dr Tennis Must to hold plavix for colonoscopy again   Lab Results  Component Value Date   HCT 38.8 05/20/2016    Bruit : duplex 01/30/16 plaque no stenosis f/u duplex 01/2018     Jenkins Rouge

## 2016-07-19 ENCOUNTER — Encounter: Payer: Self-pay | Admitting: Internal Medicine

## 2016-07-19 ENCOUNTER — Encounter (INDEPENDENT_AMBULATORY_CARE_PROVIDER_SITE_OTHER): Payer: Self-pay

## 2016-07-19 ENCOUNTER — Ambulatory Visit (INDEPENDENT_AMBULATORY_CARE_PROVIDER_SITE_OTHER): Payer: BLUE CROSS/BLUE SHIELD | Admitting: Internal Medicine

## 2016-07-19 ENCOUNTER — Ambulatory Visit: Payer: BLUE CROSS/BLUE SHIELD | Admitting: Internal Medicine

## 2016-07-19 VITALS — BP 140/84 | HR 84 | Ht 63.5 in | Wt 148.5 lb

## 2016-07-19 DIAGNOSIS — Z5181 Encounter for therapeutic drug level monitoring: Secondary | ICD-10-CM

## 2016-07-19 DIAGNOSIS — D500 Iron deficiency anemia secondary to blood loss (chronic): Secondary | ICD-10-CM | POA: Diagnosis not present

## 2016-07-19 DIAGNOSIS — Z7902 Long term (current) use of antithrombotics/antiplatelets: Secondary | ICD-10-CM

## 2016-07-19 DIAGNOSIS — Z86010 Personal history of colonic polyps: Secondary | ICD-10-CM

## 2016-07-19 MED ORDER — NA SULFATE-K SULFATE-MG SULF 17.5-3.13-1.6 GM/177ML PO SOLN: 1.0000 | Freq: Once | ORAL | 0 refills | Status: AC

## 2016-07-19 NOTE — Progress Notes (Signed)
HISTORY OF PRESENT ILLNESS:  [name] is a 66 y.o. female with multiple significant medical problems including coronary artery disease, hypertension, hyperlipidemia, prior myocardial infarction with stent placement on chronic Plavix, and a history of multiple adenomatous colon polyps. She presents today for follow-up regarding surveillance colonoscopy in her iron deficiency anemia. He is accompanied by her husband. Patient was hospitalized in August 2017 with severe iron deficiency anemia (hemoglobin 5.6, ferritin 1 was (. Seemed later that month in the office by our GI physician assistant regarding the same. Subsequently underwent colonoscopy and upper endoscopy 02/19/2016. Upper endoscopy was unremarkable. Colonoscopy revealed multiple polyps including large and sessile removed piecemeal which were adenomatous and SSP. As well AVM. He was placed on iron. Repeat colonoscopy in 6 months recommended. The patient did have follow-up blood work in December with hemoglobin 13.2 and MCV 90. She continues on iron. No GI complaints. No bleeding.  REVIEW OF SYSTEMS:  All non-GI ROS negative except for arthritis  Past Medical History:  Diagnosis Date  . ABDOMINAL AORTIC ANEURYSM   . Anemia   . Arthritis    HANDS  . Blood transfusion without reported diagnosis    [facility] 2017  . CAD   . CONGESTIVE HEART FAILURE, SYSTOLIC, CHRONIC   . CT, CHEST, ABNORMAL   . GERD (gastroesophageal reflux disease)   . History of colon polyps 01/05/2014   adenomatous polyps  . HYPERLIPIDEMIA   . HYPERTENSION   . ISCHEMIC CARDIOMYOPATHY   . Lower GI bleed 01/2014  . 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 TOBACCO ABUSE     Past Surgical History:  Procedure Laterality Date  . bare metal stent placement     left anterior descending artery  . COLONOSCOPY N/A 01/05/2014   Procedure: COLONOSCOPY;  Surgeon: Jerene Bears, MD;  Location: [facility];   Service: Endoscopy;  Laterality: N/A;  . COLONOSCOPY N/A 01/06/2014   Procedure: COLONOSCOPY;  Surgeon: Jerene Bears, MD;  Location: [facility];  Service: Endoscopy;  Laterality: N/A;  . COLONOSCOPY      Social History LYBERTI AFSHAR  reports that she quit smoking about 4 years ago. Her smoking use included Cigarettes and E-cigarettes. She has never used smokeless tobacco. She reports that she drinks alcohol. She reports that she does not use drugs.  family history includes Anemia in her sister; Colon polyps in her father and sister; Depression (age of onset: 27) in her father; Diabetes in her maternal grandmother; Lung cancer in her sister; Lymphoma (age of onset: 80) in her mother.  No Known Allergies     PHYSICAL EXAMINATION: Vital signs: BP 140/84   Pulse 84   Ht 5' 3.5" (1.613 m)   Wt 148 lb 8 oz (67.4 kg)   BMI 25.89 kg/m   Constitutional: generally well-appearing, no acute distress Psychiatric: alert and oriented x3, cooperative Eyes: extraocular movements intact, anicteric, conjunctiva pink Mouth: oral pharynx moist, no lesions Neck: supple no lymphadenopathy Cardiovascular: heart regular rate and rhythm, no murmur Lungs: clear to auscultation bilaterally Abdomen: soft, nontender, nondistended, no obvious ascites, no peritoneal signs, normal bowel sounds, no organomegaly Rectal: Deferred until colonoscopy Extremities: no clubbing cyanosis or lower extremity edema bilaterally Skin: no lesions on visible extremities Neuro: No focal deficits. Cranial nerves intact  ASSESSMENT:  #1. History of severe iron deficiency anemia in a patient on chronic Plavix therapy. GI mucosal lesions which may explain this include multiple  adenomatous colon polyps and vascular malformation of the colon. She has responded nicely to iron replacement therapy with normalization of blood counts and ferritin. We have asked that she continue on iron indefinitely as she may be at risk for chronic blood  loss from known AVMs in the colon or elsewhere #2. History of multiple significant adenomatous colon polyps including piecemeal resections September 2017. Due for surveillance #3. Multiple medical problems. Stable. On chronic Plavix. Interrupted therapy last time with cardiology permission. Has had no interval medical issues   PLAN:  #1. Continue iron but hold 1 week prior to colonoscopy to assist with preparation #2. Surveillance colonoscopy. The patient is high-risk given her comorbidities and the need to address her necessary chronic Plavix therapy.The nature of the procedure, as well as the risks, benefits, and alternatives were carefully and thoroughly reviewed with the patient. Ample time for discussion and questions allowed. The patient understood, was satisfied, and agreed to proceed. #3. Hold Plavix 1 week prior to the procedure as previous. #4. Periodic CBC and 13 with PCP to assure durable response to iron replacement

## 2016-07-19 NOTE — Patient Instructions (Signed)

## 2016-07-25 ENCOUNTER — Other Ambulatory Visit: Payer: Self-pay | Admitting: Internal Medicine

## 2016-07-25 ENCOUNTER — Encounter (INDEPENDENT_AMBULATORY_CARE_PROVIDER_SITE_OTHER): Payer: Self-pay

## 2016-07-25 ENCOUNTER — Ambulatory Visit (INDEPENDENT_AMBULATORY_CARE_PROVIDER_SITE_OTHER): Payer: BLUE CROSS/BLUE SHIELD | Admitting: Cardiovascular Disease

## 2016-07-25 ENCOUNTER — Encounter: Payer: Self-pay | Admitting: Cardiovascular Disease

## 2016-07-25 VITALS — BP 160/100 | HR 78 | Ht 63.0 in | Wt 150.8 lb

## 2016-07-25 DIAGNOSIS — I2583 Coronary atherosclerosis due to lipid rich plaque: Secondary | ICD-10-CM | POA: Diagnosis not present

## 2016-07-25 DIAGNOSIS — I2510 Atherosclerotic heart disease of native coronary artery without angina pectoris: Secondary | ICD-10-CM

## 2016-07-25 MED ORDER — LOSARTAN POTASSIUM-HCTZ 50-12.5 MG PO TABS: 1.0000 | ORAL_TABLET | Freq: Every day | ORAL | 3 refills | Status: DC

## 2016-07-25 MED ORDER — NITROGLYCERIN 0.4 MG SL SUBL: 0.4000 mg | SUBLINGUAL_TABLET | SUBLINGUAL | 2 refills | Status: DC | PRN

## 2016-07-25 MED ORDER — CARVEDILOL 6.25 MG PO TABS: 6.2500 mg | ORAL_TABLET | Freq: Every day | ORAL | 3 refills | Status: DC

## 2016-07-25 MED ORDER — CARVEDILOL 6.25 MG PO TABS: 6.2500 mg | ORAL_TABLET | Freq: Two times a day (BID) | ORAL | 3 refills | Status: DC

## 2016-07-25 MED ORDER — SIMVASTATIN 40 MG PO TABS: 40.0000 mg | ORAL_TABLET | Freq: Every day | ORAL | 3 refills | Status: DC

## 2016-07-25 NOTE — Patient Instructions (Signed)

## 2016-07-26 ENCOUNTER — Telehealth: Payer: Self-pay | Admitting: Cardiovascular Disease

## 2016-07-26 NOTE — Addendum Note (Signed)
Addended by: Roberts Gaudy on: 07/26/2016 08:22 AM   Modules accepted: Orders

## 2016-07-26 NOTE — Telephone Encounter (Signed)
New Message    *STAT* If patient is at the pharmacy, call can be transferred to refill team.   1. Which medications need to be refilled? (please list name of each medication and dose if known) carvedilol (COREG) 6.25 MG tablet  2. Which pharmacy/location (including street and city if local pharmacy) is medication to be sent to? [facility] on [address]  3. Do they need a 30 day or 90 day supply? 90 day

## 2016-07-26 NOTE — Telephone Encounter (Signed)
Medication Detail    Disp Refills Start End   carvedilol (COREG) 6.25 MG tablet 180 tablet 3 07/25/2016    Sig - Route: Take 1 tablet (6.25 mg total) by mouth 2 (two) times daily with a meal. - Oral   E-Prescribing Status: Receipt confirmed by pharmacy (07/25/2016 9:24 AM EST)   Pharmacy   [id number] - [place], [place] - [address] AT [address] [place]

## 2016-08-14 ENCOUNTER — Telehealth: Payer: Self-pay | Admitting: Internal Medicine

## 2016-08-16 NOTE — Telephone Encounter (Signed)
Spoke with patient's husband who explained that patient has multiple colonoscopies a year.  I told him I would leave a prep up front to be picked up.  Patient's husband agreed.

## 2016-09-12 ENCOUNTER — Encounter: Payer: Self-pay | Admitting: Internal Medicine

## 2016-09-26 ENCOUNTER — Ambulatory Visit (AMBULATORY_SURGERY_CENTER): Payer: BLUE CROSS/BLUE SHIELD | Admitting: Internal Medicine

## 2016-09-26 ENCOUNTER — Encounter: Payer: Self-pay | Admitting: Internal Medicine

## 2016-09-26 VITALS — BP 156/71 | HR 64 | Temp 97.8°F | Resp 12 | Ht 63.5 in | Wt 148.0 lb

## 2016-09-26 DIAGNOSIS — D122 Benign neoplasm of ascending colon: Secondary | ICD-10-CM | POA: Diagnosis not present

## 2016-09-26 DIAGNOSIS — D120 Benign neoplasm of cecum: Secondary | ICD-10-CM

## 2016-09-26 DIAGNOSIS — Z86010 Personal history of colonic polyps: Secondary | ICD-10-CM

## 2016-09-26 DIAGNOSIS — D123 Benign neoplasm of transverse colon: Secondary | ICD-10-CM

## 2016-09-26 MED ORDER — SODIUM CHLORIDE 0.9 % IV SOLN: 500.0000 mL | INTRAVENOUS | Status: DC

## 2016-09-26 NOTE — Progress Notes (Signed)
Patient awakening,vss,report to rn 

## 2016-09-26 NOTE — Progress Notes (Signed)
Called to room to assist during endoscopic procedure.  Patient ID and intended procedure confirmed with present staff. Received instructions for my participation in the procedure from the performing physician.  

## 2016-09-26 NOTE — Patient Instructions (Signed)
YOU HAD AN ENDOSCOPIC PROCEDURE TODAY AT [facility]:   Refer to the procedure report that was given to you for any specific questions about what was found during the examination.  If the procedure report does not answer your questions, please call your gastroenterologist to clarify.  If you requested that your care partner not be given the details of your procedure findings, then the procedure report has been included in a sealed envelope for you to review at your convenience later.  YOU SHOULD EXPECT: Some feelings of bloating in the abdomen. Passage of more gas than usual.  Walking can help get rid of the air that was put into your GI tract during the procedure and reduce the bloating. If you had a lower endoscopy (such as a colonoscopy or flexible sigmoidoscopy) you may notice spotting of blood in your stool or on the toilet paper. If you underwent a bowel prep for your procedure, you may not have a normal bowel movement for a few days.  Please Note:  You might notice some irritation and congestion in your nose or some drainage.  This is from the oxygen used during your procedure.  There is no need for concern and it should clear up in a day or so.  SYMPTOMS TO REPORT IMMEDIATELY:   Following lower endoscopy (colonoscopy or flexible sigmoidoscopy):  Excessive amounts of blood in the stool  Significant tenderness or worsening of abdominal pains  Swelling of the abdomen that is new, acute  Fever of 100F or higher    For urgent or emergent issues, a gastroenterologist can be reached at any hour by calling [phone].   DIET:  We do recommend a small meal at first, but then you may proceed to your regular diet.  Drink plenty of fluids but you should avoid alcoholic beverages for 24 hours.  ACTIVITY:  You should plan to take it easy for the rest of today and you should NOT DRIVE or use heavy machinery until tomorrow (because of the sedation medicines used during the test).     FOLLOW UP: Our staff will call the number listed on your records the next business day following your procedure to check on you and address any questions or concerns that you may have regarding the information given to you following your procedure. If we do not reach you, we will leave a message.  However, if you are feeling well and you are not experiencing any problems, there is no need to return our call.  We will assume that you have returned to your regular daily activities without incident.  If any biopsies were taken you will be contacted by phone or by letter within the next 1-3 weeks.  Please call us at [phone] if you have not heard about the biopsies in 3 weeks.    SIGNATURES/CONFIDENTIALITY: You and/or your care partner have signed paperwork which will be entered into your electronic medical record.  These signatures attest to the fact that that the information above on your After Visit Summary has been reviewed and is understood.  Full responsibility of the confidentiality of this discharge information lies with you and/or your care-partner.   Information on polyps,diverticulosis,& hemorrhoids  given to you today  RESUME PLAVIX TODAY  REPEAT COLONOSCOPY IN 1 YEAR  RESUME PREVIOUS DIET AND PREVIOUS MEDICATIONS

## 2016-09-26 NOTE — Progress Notes (Signed)
No egg or soy allergy known to patient  No issues with past sedation with any surgeries  or procedures, no intubation problems  No diet pills per patient No home 02 use per patient    Pt states issues with constipation - due to iron but uses stool softener and that helps   On plavix- last does was last Wednesday 09-18-2016 per pt

## 2016-09-26 NOTE — Op Note (Signed)
[place] Patient Name: [name] Procedure Date: 09/26/2016 2:16 PM MRN: [id number] Endoscopist: Docia Chuck. Henrene Pastor , MD Age: 66 Referring MD:  Date of Birth: 1950-08-22 Gender: Female Account #: [id number] Procedure:                Colonoscopy, with biopsies and cold snare                            polypectomies x 3 Indications:              High risk colon cancer surveillance: Personal                            history of adenoma (10 mm or greater in size), High                            risk colon cancer surveillance: Personal history of                            multiple (3 or more) adenomas. Previous                            examinations in 2015 and September 2017 with                            multiple and large and sessile and piecemeal                            adenomatous polypectomies Medicines:                Monitored Anesthesia Care Procedure:                Pre-Anesthesia Assessment:                           - Prior to the procedure, a History and Physical                            was performed, and patient medications and                            allergies were reviewed. The patient's tolerance of                            previous anesthesia was also reviewed. The risks                            and benefits of the procedure and the sedation                            options and risks were discussed with the patient.                            All questions were answered, and informed consent  was obtained. Prior Anticoagulants: The patient has                            taken Plavix (clopidogrel), last dose was 7 days                            prior to procedure. ASA Grade Assessment: III - A                            patient with severe systemic disease. After                            reviewing the risks and benefits, the patient was                            deemed in satisfactory condition to undergo the                           procedure.                           After obtaining informed consent, the colonoscope                            was passed under direct vision. Throughout the                            procedure, the patient's blood pressure, pulse, and                            oxygen saturations were monitored continuously. The                            Colonoscope was introduced through the anus and                            advanced to the the cecum, identified by                            appendiceal orifice and ileocecal valve. The                            ileocecal valve, appendiceal orifice, and rectum                            were photographed. The quality of the bowel                            preparation was excellent. The colonoscopy was                            performed without difficulty. The patient tolerated  the procedure well. The bowel preparation used was                            SUPREP. Scope In: 2:26:02 PM Scope Out: 2:51:21 PM Scope Withdrawal Time: 0 hours 22 minutes 53 seconds  Total Procedure Duration: 0 hours 25 minutes 19 seconds  Findings:                 An area from previous polypectomy revealed a                            slightly irregular tissue in the region. This was                            found in the cecum. Biopsies were taken with a cold                            forceps for histology, to rule out adenoma.                           Three sessile polyps were found in the transverse                            colon, hepatic flexure and ascending colon. The                            polyps were 4 to 6 mm in size. These polyps were                            removed with a cold snare. Resection and retrieval                            were complete.                           A few diverticula were found in the sigmoid colon.                           Internal hemorrhoids were found during  retroflexion.                           The exam was otherwise without abnormality on                            direct and retroflexion views. Complications:            No immediate complications. Estimated blood loss:                            None. Estimated Blood Loss:     Estimated blood loss: none. Impression:               - Questionable residual polyp in the cecum.  Biopsied.                           - Three 4 to 6 mm polyps in the transverse colon,                            at the hepatic flexure and in the ascending colon,                            removed with a cold snare. Resected and retrieved.                           - Diverticulosis in the sigmoid colon.                           - Internal hemorrhoids.                           - The examination was otherwise normal on direct                            and retroflexion views. Recommendation:           - Repeat colonoscopy in 1 year for surveillance.                           - Resume Plavix (clopidogrel) today at prior dose.                           - Patient has a contact number available for                            emergencies. The signs and symptoms of potential                            delayed complications were discussed with the                            patient. Return to normal activities tomorrow.                            Written discharge instructions were provided to the                            patient.                           - Resume previous diet.                           - Continue present medications.                           - Await pathology results. Docia Chuck. Henrene Pastor, MD 09/26/2016 3:01:16 PM This report has been signed electronically.

## 2016-09-27 ENCOUNTER — Telehealth: Payer: Self-pay

## 2016-09-27 NOTE — Telephone Encounter (Signed)
  Follow up Call-  Call back number 09/26/2016 02/19/2016 06/07/2014 01/05/2014  Post procedure Call Back phone  # [phone]  Permission to leave phone message Yes Yes Yes Yes  Some recent data might be hidden     Patient questions:  Do you have a fever, pain , or abdominal swelling? No. Pain Score  0 *  Have you tolerated food without any problems? Yes.    Have you been able to return to your normal activities? Yes.    Do you have any questions about your discharge instructions: Diet   No. Medications  No. Follow up visit  No.  Do you have questions or concerns about your Care? No.  Actions: * If pain score is 4 or above: No action needed, pain <4.

## 2016-10-02 ENCOUNTER — Encounter: Payer: Self-pay | Admitting: Internal Medicine

## 2017-01-11 ENCOUNTER — Other Ambulatory Visit: Payer: Self-pay | Admitting: Internal Medicine

## 2017-02-10 ENCOUNTER — Other Ambulatory Visit: Payer: Self-pay | Admitting: Cardiovascular Disease

## 2017-02-11 NOTE — Progress Notes (Signed)
Patient ID: [name], female   DOB: June 10, 1950, 66 y.o.   MRN: [id number]   [name] is seen today in followup for coronary artery disease. She has a distant history of anterior wall MI 1998. She had a bare-metal stent. She had reintervention in 2008. 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   Quit smoking   Taking coreg once daily bid makes her fatigued Restarted at lower dose and tolerating Hyzaar caused some dizzyness  BP running fine at home records reviewed   Echo 08/16/13 Normal EF  Study Conclusions  - Left ventricle: The cavity size was normal. Wall thickness was normal. Systolic function was normal. The estimated ejection fraction was in the range of 55% to 60%. Wall motion was normal; there were no regional wall motion abnormalities. Doppler parameters are consistent with abnormal left ventricular relaxation (grade 1 diastolic dysfunction). The E/e' ratio is <10, suggesting normal LV filling pressure. - Mitral valve: Calcified annulus. Mild regurgitation. - Left atrium: The atrium was at the upper limits of normal in size. - Tricuspid valve: Mild regurgitation. - Pulmonary arteries: PA peak pressure: 1mm Hg (S). - Inferior vena cava: The vessel was normal in size; the respirophasic diameter changes were in the normal range (= 50%); findings are consistent with normal central venous pressure.   Seeing Dr Henrene Pastor for anemia  Had bleeding polyps removed     Husband [name] has DCM sees DB and had renal cell carcinoma post nephrectomy now  Recent gluteal abscess and sister in [facility] has lung cancer   BP higher at home and in office today Depressed about sister passing this week in [facility] Had lung cancer   ROS: Denies fever, malais, weight loss, blurry vision, decreased visual acuity, cough, sputum, SOB, hemoptysis, pleuritic pain, palpitaitons, heartburn, abdominal pain, melena, lower extremity edema, claudication, or rash.  All other systems  reviewed and negative  General: BP (!) 182/108   Pulse 88   Ht 5\' 3"  (1.6 m)   Wt 134 lb (60.8 kg)   BMI 23.74 kg/m  Affect appropriate Chronically ill pale white female  HEENT: normal Neck supple with no adenopathy JVP normal left  bruits no thyromegaly Lungs clear with no wheezing and good diaphragmatic motion Heart:  S1/S2 no murmur, no rub, gallop or click PMI normal Abdomen: benighn, BS positve, no tenderness, no AAA no bruit.  No HSM or HJR Distal pulses intact with no bruits No edema Neuro non-focal Skin warm and dry No muscular weakness    Current Outpatient Prescriptions  Medication Sig Dispense Refill  . carvedilol (COREG) 12.5 MG tablet Take 1 tablet (12.5 mg total) by mouth 2 (two) times daily with a meal. 180 tablet 3  . clopidogrel (PLAVIX) 75 MG tablet TAKE 1 TABLET BY MOUTH EVERY DAY 30 tablet 3  . docusate sodium (EQUATE STOOL SOFTENER) 100 MG capsule Take 100 mg by mouth 2 (two) times daily.    . iron polysaccharides (NIFEREX) 150 MG capsule Take 150 mg by mouth daily.    Marland Kitchen losartan-hydrochlorothiazide (HYZAAR) 100-25 MG tablet Take 1 tablet by mouth daily. 90 tablet 3  . nitroGLYCERIN (NITROSTAT) 0.4 MG SL tablet Place 1 tablet (0.4 mg total) under the tongue every 5 (five) minutes as needed for chest pain. 25 tablet 2  . pantoprazole (PROTONIX) 40 MG tablet TAKE 1 TABLET BY MOUTH EVERY DAY 30 tablet 3  . simvastatin (ZOCOR) 40 MG tablet Take 1 tablet (40 mg total) by mouth  daily at 6 PM. 90 tablet 3  . diazepam (VALIUM) 5 MG tablet Take 1 tablet (5 mg total) by mouth 2 (two) times daily as needed for anxiety. 60 tablet 1   Current Facility-Administered Medications  Medication Dose Route Frequency Provider Last Rate Last Dose  . 0.9 %  sodium chloride infusion  500 mL Intravenous Continuous Irene Shipper, MD      . 0.9 %  sodium chloride infusion  500 mL Intravenous Continuous Irene Shipper, MD        Allergies  Patient has no known  allergies.  Electrocardiogram: SR 89  ICRBBB 10/20/13    06/14/14  No change SR 79 ICRBBB   07/03/15  SR rate 83 normal 07/25/16  SR rate 74  ICRBBB  Assessment and Plan  CAD: Stable with no angina and good activity level.  Continue medical Rx New nitro called in LAD stent in 98 with re intervention 2008   HTN:  Poorly controlled more stress from sister pacing of lung cancer in [facility] increase coreg And Hyzaar f/u nurse visit with labs in 2-4 weeks  Chol:  Labs with Dr Forde Dandy  Cholesterol is at goal.  Continue current dose of statin and diet Rx.  No myalgias or side effects.  F/U  LFT's in 6 months. Lab Results  Component Value Date   LDLCALC 80 07/28/2013          Anemia:  On iron improved no bleeding  02/19/16 EGD normal colonoscopy with removal of 8 polyps f/u Dr Tennis Must to hold plavix for colonoscopy again   Lab Results  Component Value Date   HCT 38.8 05/20/2016    Bruit : duplex 01/30/16 plaque no stenosis f/u duplex 01/2018  Depression: Recent sisters death valium 5 bid PRN called in    Jenkins Rouge

## 2017-02-20 ENCOUNTER — Ambulatory Visit (INDEPENDENT_AMBULATORY_CARE_PROVIDER_SITE_OTHER): Payer: PPO | Admitting: Cardiovascular Disease

## 2017-02-20 ENCOUNTER — Encounter: Payer: Self-pay | Admitting: Cardiovascular Disease

## 2017-02-20 VITALS — BP 182/108 | HR 88 | Ht 63.0 in | Wt 134.0 lb

## 2017-02-20 DIAGNOSIS — I10 Essential (primary) hypertension: Secondary | ICD-10-CM

## 2017-02-20 MED ORDER — LOSARTAN POTASSIUM-HCTZ 100-25 MG PO TABS: 1.0000 | ORAL_TABLET | Freq: Every day | ORAL | 3 refills | Status: DC

## 2017-02-20 MED ORDER — DIAZEPAM 5 MG PO TABS: 5.0000 mg | ORAL_TABLET | Freq: Two times a day (BID) | ORAL | 1 refills | Status: DC | PRN

## 2017-02-20 MED ORDER — CARVEDILOL 12.5 MG PO TABS: 12.5000 mg | ORAL_TABLET | Freq: Two times a day (BID) | ORAL | 3 refills | Status: DC

## 2017-02-20 NOTE — Patient Instructions (Addendum)
Medication Instructions:  Your physician has recommended you make the following change in your medication:  1-INCREASE  Coreg 12.5 mg by mouth twice daily with meals 2-INCREASE  Losartan/HCZT 100/25 mg by mouth daily  Labwork: Your physician recommends that you return for lab work in: 2 weeks for BMET  Testing/Procedures: NONE  Follow-Up: Your physician recommends that you schedule a follow-up appointment in: 2 weeks with [facility].  Your physician wants you to follow-up in: 6 months with Dr. Johnsie Cancel. You will receive a reminder letter in the mail two months in advance. If you don't receive a letter, please call our office to schedule the follow-up appointment.   If you need a refill on your cardiac medications before your next appointment, please call your pharmacy.

## 2017-02-24 ENCOUNTER — Other Ambulatory Visit: Payer: Self-pay | Admitting: *Deleted

## 2017-02-24 ENCOUNTER — Encounter: Payer: Self-pay | Admitting: *Deleted

## 2017-02-24 NOTE — Patient Outreach (Signed)
HTA Screening call #1.  Pt sees primary care MD routinely. Takes medications as ordered for chronic problems. No acute health concerns. No care management needs. Introductory letter sent.  [name]. Myrtie Neither, MSN, [facility] Gerontological Nurse Practitioner [facility] Care Management [phone]

## 2017-03-11 ENCOUNTER — Ambulatory Visit (INDEPENDENT_AMBULATORY_CARE_PROVIDER_SITE_OTHER): Payer: PPO | Admitting: Pharmacist

## 2017-03-11 ENCOUNTER — Other Ambulatory Visit: Payer: PPO

## 2017-03-11 VITALS — BP 144/90 | HR 75 | Ht 63.0 in | Wt 133.0 lb

## 2017-03-11 DIAGNOSIS — I10 Essential (primary) hypertension: Secondary | ICD-10-CM

## 2017-03-11 LAB — BASIC METABOLIC PANEL
BUN/Creatinine Ratio: 11 — ABNORMAL LOW (ref 12–28)
BUN: 9 mg/dL (ref 8–27)
CHLORIDE: 96 mmol/L (ref 96–106)
CO2: 24 mmol/L (ref 20–29)
Calcium: 9.9 mg/dL (ref 8.7–10.3)
Creatinine, Ser: 0.83 mg/dL (ref 0.57–1.00)
GFR calc Af Amer: 86 mL/min/{1.73_m2} (ref >59)
GFR calc non Af Amer: 74 mL/min/{1.73_m2} (ref >59)
GLUCOSE: 108 mg/dL — AB (ref 65–99)
POTASSIUM: 5 mmol/L (ref 3.5–5.2)
SODIUM: 137 mmol/L (ref 134–144)

## 2017-03-11 NOTE — Patient Instructions (Signed)
Thank you for coming to see [place] today!   1. No change to medications today  2. Work on reducing sodium (salt) in your diet by eating more home cooked meals with limited salt and less TV dinners/canned foods/lunch meats   3. Labs today, we will call you if there are any issues.   4. We will see you again Tuesday November 6 at [place] in your blood pressure cuff and readings

## 2017-03-11 NOTE — Progress Notes (Signed)
Patient ID: [name]                 DOB: [birth year]                      MRN: [id number]    HPI:  [name] is a 66 y.o. female referred by Dr. Johnsie Cancel to HTN clinic. PMH is significant for CAD s/p MI in 1998 and 2008 s/p stenting, HTN, and HLD. At last office visit 3 weeks ago, BP was elevated at 182/108 and losartan-HCTZ was increased to 100-25mg  and carvedilol was increased to 12.5mg  BID. Pt presents today for BP check and BMET.   Today, patient reports that she is nervous. She endorses significant stressors including recent death of her sister. Took her medications this morning ~ 2 hours prior to appointment. Presents with BP log, readings below all taken in AM. Denies CP, dizziness, syncope, falls. Previously with complaints of fatigue on carvedilol. Denies fatigue at this point.   Current HTN meds: losartan-HCTZ 100-25mg  daily, carvedilol 12.5mg  BID   Previously tried: lisinopril  BP goal: <130/54mmHg   Family History: Grandmother with h/o diabetes and fatal MI. Father with h/o MI + stent.   Social History: Quit smoking in 2014. Has 3-4 alcoholic drinks per week (beer).   Diet:  Breakfast - toast Lunch - sandwich with lunch meat Supper - TV dinner, sometimes home made (uses salt)  Caffeine - 2 cups of coffee in mornings   Exercise: working in yard (Engineer, building services), cleaning house, and tending to three big dogs  Home BP readings: 100s-120s/60s-70s - arm cuff; takes in mornings before coffee  Wt Readings from Last 3 Encounters:  02/20/17 134 lb (60.8 kg)  09/26/16 148 lb (67.1 kg)  07/25/16 150 lb 12.8 oz (68.4 kg)   BP Readings from Last 3 Encounters:  02/20/17 (!) 182/108  09/26/16 (!) 156/71  07/25/16 (!) 160/100   Pulse Readings from Last 3 Encounters:  02/20/17 88  09/26/16 64  07/25/16 78    Renal function: CrCl cannot be calculated (Patient's most recent lab result is older than the maximum 21 days allowed.).  Past Medical History:  Diagnosis Date    . ABDOMINAL AORTIC ANEURYSM   . Anemia   . Arthritis    HANDS  . Blood transfusion without reported diagnosis    AUGUST 5TH 2017 x2   . CAD   . CONGESTIVE HEART FAILURE, SYSTOLIC, CHRONIC   . CT, CHEST, ABNORMAL   . GERD (gastroesophageal reflux disease)   . History of colon polyps 01/05/2014   adenomatous polyps  . HYPERLIPIDEMIA   . HYPERTENSION   . ISCHEMIC CARDIOMYOPATHY   . Lower GI bleed 01/2014  . 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 TOBACCO ABUSE     Current Outpatient Prescriptions on File Prior to Visit  Medication Sig Dispense Refill  . carvedilol (COREG) 12.5 MG tablet Take 1 tablet (12.5 mg total) by mouth 2 (two) times daily with a meal. 180 tablet 3  . clopidogrel (PLAVIX) 75 MG tablet TAKE 1 TABLET BY MOUTH EVERY DAY 30 tablet 3  . diazepam (VALIUM) 5 MG tablet Take 1 tablet (5 mg total) by mouth 2 (two) times daily as needed for anxiety. 60 tablet 1  . docusate sodium (EQUATE STOOL SOFTENER) 100 MG capsule Take 100 mg by mouth 2 (two) times daily.    . iron polysaccharides (NIFEREX)  150 MG capsule Take 150 mg by mouth daily.    Marland Kitchen losartan-hydrochlorothiazide (HYZAAR) 100-25 MG tablet Take 1 tablet by mouth daily. 90 tablet 3  . nitroGLYCERIN (NITROSTAT) 0.4 MG SL tablet Place 1 tablet (0.4 mg total) under the tongue every 5 (five) minutes as needed for chest pain. 25 tablet 2  . pantoprazole (PROTONIX) 40 MG tablet TAKE 1 TABLET BY MOUTH EVERY DAY 30 tablet 3  . simvastatin (ZOCOR) 40 MG tablet Take 1 tablet (40 mg total) by mouth daily at 6 PM. 90 tablet 3   Current Facility-Administered Medications on File Prior to Visit  Medication Dose Route Frequency Provider Last Rate Last Dose  . 0.9 %  sodium chloride infusion  500 mL Intravenous Continuous Irene Shipper, MD      . 0.9 %  sodium chloride infusion  500 mL Intravenous Continuous Irene Shipper, MD        No Known  Allergies   Assessment/Plan:   1. Hypertension - Above goal of <130/80 per in-office check x2 however incongruent with home readings which are all below goal. Did not bring in home BP cuff therefore unable to assess technique and accuracy. Patient has high-sodium intake and does not exercise at present. Additionally, patient is under a significant amount of residual stress from recent death of family member. Will defer medication changes at this time. Counseled on lifestyle modifications and pt will work to reduce sodium intake. BMET today as medications were changed at last office visit. Will follow up for BP recheck in ~1 month. Patient instructed to bring in cuff to assess accuracy as well as home BP readings.   Carlean Jews, Pharm.D. PGY2 Ambulatory Care Pharmacy Resident Phone: [phone]

## 2017-03-11 NOTE — Progress Notes (Deleted)
Patient ID: AVAREE GILBERTI                 DOB: [birth year]                      MRN: [id number]     HPI: [name] is a 66 y.o. female referred by Dr. Johnsie Cancel to HTN clinic. PMH is significant for CAD s/p MI in 1998, HTN, and HLD. At last office visit 3 weeks ago, BP was elevated at 182/108 and losartan-HCTZ was increased to 100-25mg  and carvedilol was increase to 12.5mg  BID. Pt presents today for BP check and BMET.  Start amlodipine if needed  Previously took coreg once daily - fatigue on twice daily Stressed from sister passing from lung cancer  Current HTN meds: losartan-HCTZ 100-25mg  daily, carvedilol 12.5mg  BID Previously tried:  BP goal: <130/63mmHg  Family History: Grandmother with history of diabetes.  Social History: Quit smoking in 2014. Has 3-4 alcoholic drinks per week.  Diet:   Exercise:   Home BP readings:   Wt Readings from Last 3 Encounters:  02/20/17 134 lb (60.8 kg)  09/26/16 148 lb (67.1 kg)  07/25/16 150 lb 12.8 oz (68.4 kg)   BP Readings from Last 3 Encounters:  02/20/17 (!) 182/108  09/26/16 (!) 156/71  07/25/16 (!) 160/100   Pulse Readings from Last 3 Encounters:  02/20/17 88  09/26/16 64  07/25/16 78    Renal function: CrCl cannot be calculated (Patient's most recent lab result is older than the maximum 21 days allowed.).  Past Medical History:  Diagnosis Date  . ABDOMINAL AORTIC ANEURYSM   . Anemia   . Arthritis    HANDS  . Blood transfusion without reported diagnosis    AUGUST 5TH 2017 x2   . CAD   . CONGESTIVE HEART FAILURE, SYSTOLIC, CHRONIC   . CT, CHEST, ABNORMAL   . GERD (gastroesophageal reflux disease)   . History of colon polyps 01/05/2014   adenomatous polyps  . HYPERLIPIDEMIA   . HYPERTENSION   . ISCHEMIC CARDIOMYOPATHY   . Lower GI bleed 01/2014  . 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 TOBACCO ABUSE     Current Outpatient Prescriptions on File  Prior to Visit  Medication Sig Dispense Refill  . carvedilol (COREG) 12.5 MG tablet Take 1 tablet (12.5 mg total) by mouth 2 (two) times daily with a meal. 180 tablet 3  . clopidogrel (PLAVIX) 75 MG tablet TAKE 1 TABLET BY MOUTH EVERY DAY 30 tablet 3  . diazepam (VALIUM) 5 MG tablet Take 1 tablet (5 mg total) by mouth 2 (two) times daily as needed for anxiety. 60 tablet 1  . docusate sodium (EQUATE STOOL SOFTENER) 100 MG capsule Take 100 mg by mouth 2 (two) times daily.    . iron polysaccharides (NIFEREX) 150 MG capsule Take 150 mg by mouth daily.    Marland Kitchen losartan-hydrochlorothiazide (HYZAAR) 100-25 MG tablet Take 1 tablet by mouth daily. 90 tablet 3  . nitroGLYCERIN (NITROSTAT) 0.4 MG SL tablet Place 1 tablet (0.4 mg total) under the tongue every 5 (five) minutes as needed for chest pain. 25 tablet 2  . pantoprazole (PROTONIX) 40 MG tablet TAKE 1 TABLET BY MOUTH EVERY DAY 30 tablet 3  . simvastatin (ZOCOR) 40 MG tablet Take 1 tablet (40 mg total) by mouth daily at 6 PM. 90 tablet 3   Current Facility-Administered Medications on File Prior  to Visit  Medication Dose Route Frequency Provider Last Rate Last Dose  . 0.9 %  sodium chloride infusion  500 mL Intravenous Continuous Irene Shipper, MD      . 0.9 %  sodium chloride infusion  500 mL Intravenous Continuous Irene Shipper, MD        No Known Allergies   Assessment/Plan:  1. Hypertension -

## 2017-03-14 ENCOUNTER — Other Ambulatory Visit: Payer: Self-pay | Admitting: Internal Medicine

## 2017-03-25 DIAGNOSIS — E7849 Other hyperlipidemia: Secondary | ICD-10-CM | POA: Diagnosis not present

## 2017-03-25 DIAGNOSIS — D6489 Other specified anemias: Secondary | ICD-10-CM | POA: Diagnosis not present

## 2017-03-25 DIAGNOSIS — K7689 Other specified diseases of liver: Secondary | ICD-10-CM | POA: Diagnosis not present

## 2017-03-25 DIAGNOSIS — I10 Essential (primary) hypertension: Secondary | ICD-10-CM | POA: Diagnosis not present

## 2017-03-25 DIAGNOSIS — Z6823 Body mass index (BMI) 23.0-23.9, adult: Secondary | ICD-10-CM | POA: Diagnosis not present

## 2017-03-25 DIAGNOSIS — I714 Abdominal aortic aneurysm, without rupture: Secondary | ICD-10-CM | POA: Diagnosis not present

## 2017-03-25 DIAGNOSIS — I2510 Atherosclerotic heart disease of native coronary artery without angina pectoris: Secondary | ICD-10-CM | POA: Diagnosis not present

## 2017-03-25 DIAGNOSIS — D126 Benign neoplasm of colon, unspecified: Secondary | ICD-10-CM | POA: Diagnosis not present

## 2017-03-25 DIAGNOSIS — I7789 Other specified disorders of arteries and arterioles: Secondary | ICD-10-CM | POA: Diagnosis not present

## 2017-04-07 NOTE — Progress Notes (Signed)
Patient ID: [name]                 DOB: [birth year]                      MRN: [id number]     HPI: [name] is a 66 y.o. female referred by Dr. Johnsie Cancel to HTN clinic. PMH is significant for CAD s/p MI in 1998 and 2008 s/p stenting, HTN, and HLD. At last office visit with Dr. Johnsie Cancel in September 2018, BP was elevated at 182/108 and losartan-HCTZ was increased to 100-25mg  and carvedilol was increased to 12.5mg  BID. Pt last seen in HTN clinic on 03/11/2017. At that time BP was found to be elevated in setting of increased stress, however this was incongruent with home BP readings, therefore no medication changes were made and lifestyle modifications were reinforced. BMET from 03/11/17 with stable SCr at 0.83 (baseline of ~0.7-0.8) and K 5.0.    Today, patient reports that she has cut "way back" when salting food, has cut out TV dinners and lunch meat. Took her meds ~7 am this morning. Has had dizziness x2 in past month when rising but thinks this may be related to decreased PO intake. Denies falls, syncope, or CP. Stress had improved but is still present.   Brought in home BP cuff. Patient demonstrates proper technique. Readings within 2 mmHg of manual check. Home BP cuff reading =158/90 mmHg; manual = 162/90 mmHg.  All home BP readings are significantly lower and at goal <167mmHg. Readings consistently 100-120s/60-70s.  Current HTN meds: losartan-HCTZ 100-25mg  daily, carvedilol 12.5mg  BID   Previously tried: lisinopril  BP goal: <130/80 mmHg   Family History: Grandmother with h/o diabetes and fatal MI. Father with h/o MI + stent.   Social History: Quit smoking in 2014. Has 3-4 alcoholic drinks per week (beer).   Diet:  Breakfast - Cereal or toast Lunch - boiled eggs or fruit Supper - Home cooked, crock pot, less salt than usual Caffeine - 2 cups of decaf coffee in mornings   Exercise: working in yard (Engineer, building services), cleaning house, painted inside of house, and tending to three big  dogs  Home BP readings: 100s-120s/60s-70s mmHg -  HR 60s-70s BPM; demonstrates proper technique, home cuff accurate compared to manual check.    Wt Readings from Last 3 Encounters:  04/08/17 132 lb (59.9 kg)  03/11/17 133 lb (60.3 kg)  02/20/17 134 lb (60.8 kg)   BP Readings from Last 3 Encounters:  04/08/17 (!) 160/90  03/11/17 (!) 144/90  02/20/17 (!) 182/108   Pulse Readings from Last 3 Encounters:  04/08/17 70  03/11/17 75  02/20/17 88    Renal function: CrCl cannot be calculated (Patient's most recent lab result is older than the maximum 21 days allowed.).  Past Medical History:  Diagnosis Date  . ABDOMINAL AORTIC ANEURYSM   . Anemia   . Arthritis    HANDS  . Blood transfusion without reported diagnosis    AUGUST 5TH 2017 x2   . CAD   . CONGESTIVE HEART FAILURE, SYSTOLIC, CHRONIC   . CT, CHEST, ABNORMAL   . GERD (gastroesophageal reflux disease)   . History of colon polyps 01/05/2014   adenomatous polyps  . HYPERLIPIDEMIA   . HYPERTENSION   . ISCHEMIC CARDIOMYOPATHY   . Lower GI bleed 01/2014  . 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 TOBACCO ABUSE  Current Outpatient Medications on File Prior to Visit  Medication Sig Dispense Refill  . carvedilol (COREG) 12.5 MG tablet Take 1 tablet (12.5 mg total) by mouth 2 (two) times daily with a meal. 180 tablet 3  . clopidogrel (PLAVIX) 75 MG tablet TAKE 1 TABLET BY MOUTH EVERY DAY 30 tablet 3  . diazepam (VALIUM) 5 MG tablet Take 1 tablet (5 mg total) by mouth 2 (two) times daily as needed for anxiety. 60 tablet 1  . docusate sodium (EQUATE STOOL SOFTENER) 100 MG capsule Take 100 mg by mouth 2 (two) times daily.    . iron polysaccharides (NIFEREX) 150 MG capsule Take 150 mg by mouth daily.    Marland Kitchen losartan-hydrochlorothiazide (HYZAAR) 100-25 MG tablet Take 1 tablet by mouth daily. 90 tablet 3  . nitroGLYCERIN (NITROSTAT) 0.4 MG SL tablet Place 1 tablet  (0.4 mg total) under the tongue every 5 (five) minutes as needed for chest pain. 25 tablet 2  . pantoprazole (PROTONIX) 40 MG tablet TAKE 1 TABLET BY MOUTH EVERY DAY 90 tablet 1  . simvastatin (ZOCOR) 40 MG tablet Take 1 tablet (40 mg total) by mouth daily at 6 PM. 90 tablet 3   No current facility-administered medications on file prior to visit.     No Known Allergies   Assessment/Plan:  1. Hypertension - Above goal of <130/80 mmHg per in-office check x2 however incongruent with home readings which are all at goal. Home BP cuff accurate and pt demonstrates proper technique. Pt has made significant improvements to dietary sodium intake. Given controlled readings at home and accuracy of home BP cuff, will not change any medications at this time. Instructed patient to bring in home readings to future visits and encouraged her to continue checking once daily, alternating times of day. F/u in HTN clinic as needed.  Carlean Jews, Pharm.D. PGY2 [facility] Pharm[facility] Resident Phone: [phone]

## 2017-04-08 ENCOUNTER — Ambulatory Visit (INDEPENDENT_AMBULATORY_CARE_PROVIDER_SITE_OTHER): Payer: PPO | Admitting: Pharmacist

## 2017-04-08 VITALS — BP 160/90 | HR 70 | Ht 63.0 in | Wt 132.0 lb

## 2017-04-08 DIAGNOSIS — I10 Essential (primary) hypertension: Secondary | ICD-10-CM | POA: Diagnosis not present

## 2017-04-08 NOTE — Patient Instructions (Addendum)
Thanks for coming to see [place]!   It looks like your blood pressure is at goal of less than 130/80 when you are at home.   No changes to your medications today. Keep taking your blood pressure at home once a day and change around the times of day that you take it so we can get an idea of how you run throughout the day.   Follow up with your doctor usual.

## 2017-06-13 ENCOUNTER — Other Ambulatory Visit: Payer: Self-pay | Admitting: Cardiovascular Disease

## 2017-06-13 ENCOUNTER — Other Ambulatory Visit: Payer: Self-pay | Admitting: Internal Medicine

## 2017-08-09 NOTE — Progress Notes (Signed)
Patient ID: [name], female   DOB: [birth year], 67 y.o.   MRN: [id number]   67 y.o. anterior wall MI 1998 Rx with BMS. Edge restenosis Rx re intervention 2008 Quit smoking since then. Echo done 08/16/13 with EF 55-60% mild MR Has had anemia with polypectomy by Dr Henrene Pastor. BP tends to run higher in office Than home.    No chest pain compliant with meds BP readings home normal white coat in office   ROS: Denies fever, malais, weight loss, blurry vision, decreased visual acuity, cough, sputum, SOB, hemoptysis, pleuritic pain, palpitaitons, heartburn, abdominal pain, melena, lower extremity edema, claudication, or rash.  All other systems reviewed and negative  General: BP (!) 162/92   Pulse 68   Ht 5\' 3"  (1.6 m)   Wt 136 lb 8 oz (61.9 kg)   BMI 24.18 kg/m  Affect appropriate Healthy:  appears stated age 16: normal Neck supple with no adenopathy JVP normal right  bruits no thyromegaly Lungs clear with no wheezing and good diaphragmatic motion Heart:  S1/S2 no murmur, no rub, gallop or click PMI normal Abdomen: benighn, BS positve, no tenderness, no AAA no bruit.  No HSM or HJR Distal pulses intact with no bruits No edema Neuro non-focal Skin warm and dry No muscular weakness     Current Outpatient Medications  Medication Sig Dispense Refill  . carvedilol (COREG) 12.5 MG tablet Take 1 tablet (12.5 mg total) by mouth 2 (two) times daily with a meal. 180 tablet 3  . clopidogrel (PLAVIX) 75 MG tablet TAKE 1 TABLET BY MOUTH EVERY DAY 30 tablet 8  . diazepam (VALIUM) 5 MG tablet Take 1 tablet (5 mg total) by mouth 2 (two) times daily as needed for anxiety. 60 tablet 1  . docusate sodium (EQUATE STOOL SOFTENER) 100 MG capsule Take 100 mg by mouth 2 (two) times daily.    . iron polysaccharides (NIFEREX) 150 MG capsule Take 150 mg by mouth daily.    Marland Kitchen losartan-hydrochlorothiazide (HYZAAR) 100-25 MG tablet Take 1 tablet by mouth daily. 90 tablet 3  . nitroGLYCERIN (NITROSTAT) 0.4 MG  SL tablet Place 1 tablet (0.4 mg total) under the tongue every 5 (five) minutes as needed for chest pain. 25 tablet 2  . simvastatin (ZOCOR) 40 MG tablet Take 1 tablet (40 mg total) by mouth daily at 6 PM. 90 tablet 3   No current facility-administered medications for this visit.     Allergies  Patient has no known allergies.  Electrocardiogram:  08/11/17 SR rate 68 normal   Assessment and Plan  CAD: Stable with no angina and good activity level.  Continue medical Rx New nitro called in LAD stent in 47 with re intervention 2008   HTN: Well controlled.  Continue current medications and low sodium Dash type diet.   Home readings normal white coat component   Chol:  On statin labs with [facility]         Anemia:  On iron improved no bleeding  02/19/16 EGD normal colonoscopy with removal of 8 polyps f/u Dr Tennis Must to hold plavix for colonoscopy again   Bruit : duplex 2016-02-11 plaque no stenosis f/u duplex February 10, 2018   Depression: Sister died last year husband with DCM and renal cell cancer PRN valium    Jenkins Rouge

## 2017-08-11 ENCOUNTER — Ambulatory Visit: Payer: PPO | Admitting: Cardiovascular Disease

## 2017-08-11 ENCOUNTER — Encounter (INDEPENDENT_AMBULATORY_CARE_PROVIDER_SITE_OTHER): Payer: Self-pay

## 2017-08-11 ENCOUNTER — Encounter: Payer: Self-pay | Admitting: Cardiovascular Disease

## 2017-08-11 VITALS — BP 162/92 | HR 68 | Ht 63.0 in | Wt 136.5 lb

## 2017-08-11 DIAGNOSIS — R0989 Other specified symptoms and signs involving the circulatory and respiratory systems: Secondary | ICD-10-CM

## 2017-08-11 DIAGNOSIS — I2583 Coronary atherosclerosis due to lipid rich plaque: Secondary | ICD-10-CM | POA: Diagnosis not present

## 2017-08-11 DIAGNOSIS — I10 Essential (primary) hypertension: Secondary | ICD-10-CM

## 2017-08-11 DIAGNOSIS — I2510 Atherosclerotic heart disease of native coronary artery without angina pectoris: Secondary | ICD-10-CM

## 2017-08-11 NOTE — Patient Instructions (Signed)

## 2017-09-15 ENCOUNTER — Other Ambulatory Visit: Payer: Self-pay | Admitting: Cardiovascular Disease

## 2017-09-15 DIAGNOSIS — E7849 Other hyperlipidemia: Secondary | ICD-10-CM | POA: Diagnosis not present

## 2017-09-15 DIAGNOSIS — Z1389 Encounter for screening for other disorder: Secondary | ICD-10-CM | POA: Diagnosis not present

## 2017-09-15 DIAGNOSIS — K7689 Other specified diseases of liver: Secondary | ICD-10-CM | POA: Diagnosis not present

## 2017-09-15 DIAGNOSIS — I779 Disorder of arteries and arterioles, unspecified: Secondary | ICD-10-CM | POA: Diagnosis not present

## 2017-09-15 DIAGNOSIS — I10 Essential (primary) hypertension: Secondary | ICD-10-CM | POA: Diagnosis not present

## 2017-09-15 DIAGNOSIS — Z6824 Body mass index (BMI) 24.0-24.9, adult: Secondary | ICD-10-CM | POA: Diagnosis not present

## 2017-09-15 DIAGNOSIS — I2510 Atherosclerotic heart disease of native coronary artery without angina pectoris: Secondary | ICD-10-CM | POA: Diagnosis not present

## 2017-09-15 DIAGNOSIS — D126 Benign neoplasm of colon, unspecified: Secondary | ICD-10-CM | POA: Diagnosis not present

## 2017-09-15 DIAGNOSIS — I714 Abdominal aortic aneurysm, without rupture: Secondary | ICD-10-CM | POA: Diagnosis not present

## 2017-09-15 DIAGNOSIS — D6489 Other specified anemias: Secondary | ICD-10-CM | POA: Diagnosis not present

## 2017-10-08 ENCOUNTER — Encounter: Payer: Self-pay | Admitting: Internal Medicine

## 2017-12-02 ENCOUNTER — Telehealth: Payer: Self-pay | Admitting: Cardiovascular Disease

## 2017-12-02 NOTE — Telephone Encounter (Signed)
New message:      Pt's spouse states Dr. Wrote a letter to excuse the pt from doing jury duty permanently and pt's spouse states she received another letter on yesterday that summons her for jury duty.

## 2017-12-02 NOTE — Telephone Encounter (Signed)
Patient's husband [name] [facility]) will call back tomorrow. He is going to see if he can find original letter.

## 2017-12-03 NOTE — Telephone Encounter (Signed)
I typically don't write letters to excuse from jury duty especially permanently  They don't seem to care about Dr notes even I have to go and serve

## 2017-12-03 NOTE — Telephone Encounter (Signed)
Follow up   Pt states he was not able to find the letter and needs another one to excuse the pt from doing jury duty permanently. Please call

## 2017-12-03 NOTE — Telephone Encounter (Signed)
Will forward to Dr. Johnsie Cancel to see if he can write a letter to excuse the patient from doing jury duty permanently.

## 2017-12-03 NOTE — Telephone Encounter (Signed)
Called patient's husband and informed him of Dr. Kyla Balzarine message. Patient's husband verbalized understanding.

## 2018-02-05 ENCOUNTER — Encounter: Payer: Self-pay | Admitting: Internal Medicine

## 2018-02-05 ENCOUNTER — Ambulatory Visit: Payer: PPO | Admitting: Internal Medicine

## 2018-02-05 VITALS — BP 120/86 | HR 82 | Ht 63.0 in | Wt 145.1 lb

## 2018-02-05 DIAGNOSIS — Z955 Presence of coronary angioplasty implant and graft: Secondary | ICD-10-CM | POA: Diagnosis not present

## 2018-02-05 DIAGNOSIS — D500 Iron deficiency anemia secondary to blood loss (chronic): Secondary | ICD-10-CM | POA: Diagnosis not present

## 2018-02-05 DIAGNOSIS — Z7902 Long term (current) use of antithrombotics/antiplatelets: Secondary | ICD-10-CM | POA: Diagnosis not present

## 2018-02-05 DIAGNOSIS — Z86010 Personal history of colonic polyps: Secondary | ICD-10-CM

## 2018-02-05 DIAGNOSIS — K219 Gastro-esophageal reflux disease without esophagitis: Secondary | ICD-10-CM

## 2018-02-05 DIAGNOSIS — Z720 Tobacco use: Secondary | ICD-10-CM

## 2018-02-05 NOTE — Patient Instructions (Signed)

## 2018-02-05 NOTE — Progress Notes (Signed)
HISTORY OF PRESENT ILLNESS:  [name] is a 67 y.o. female with multiple significant medical problems including coronary artery disease with prior stent placement, chronic Plavix therapy, prior myocardial infarction, hypertension, hyperlipidemia, and history of multiple adenomatous colon polyps. Chronic smoker. Evaluated previously for iron deficiency anemia. In addition to advanced neoplastic polyps she was noted to have AVM. Blood counts been stable on iron therapy. Last colonoscopy April 2018. Previous exams 2015 and 2017. At the time of her last exam 3 polyps removed. All adenomatous. In addition, area of scar from prior polypectomy was biopsied multiple times and did reveal residual adenoma. Follow-up in 1 year recommended. Patient reports that she has had no interval medical problems. Stable from a cardiac standpoint. Seeing her, cardiologist in 3 weeks. We held her Plavix last year for 1 week. She is accompanied by her husband. In addition to iron she does stay on PPI. She tells me that her blood counts have been stable with Dr. Forde Dandy  REVIEW OF SYSTEMS:  All non-GI ROS negative nless otherwise stated in the history of present illness except for sleeping problems  Past Medical History:  Diagnosis Date  . ABDOMINAL AORTIC ANEURYSM   . Anemia   . Arthritis    HANDS  . Blood transfusion without reported diagnosis    AUGUST 5TH 2017 x2   . CAD   . CONGESTIVE HEART FAILURE, SYSTOLIC, CHRONIC   . CT, CHEST, ABNORMAL   . GERD (gastroesophageal reflux disease)   . History of colon polyps 01/05/2014   adenomatous polyps  . HYPERLIPIDEMIA   . HYPERTENSION   . ISCHEMIC CARDIOMYOPATHY   . Lower GI bleed 01/2014  . 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 TOBACCO ABUSE     Past Surgical History:  Procedure Laterality Date  . bare metal stent placement     left anterior descending artery  . COLONOSCOPY N/A 01/05/2014   Procedure: COLONOSCOPY;  Surgeon: Jerene Bears, MD;  Location: [facility];  Service: Endoscopy;  Laterality: N/A;  . COLONOSCOPY N/A 01/06/2014   Procedure: COLONOSCOPY;  Surgeon: Jerene Bears, MD;  Location: [facility];  Service: Endoscopy;  Laterality: N/A;  . COLONOSCOPY    . POLYPECTOMY      Social History [name]  reports that she quit smoking about 5 years ago. Her smoking use included cigarettes and e-cigarettes. She has never used smokeless tobacco. She reports that she drinks alcohol. She reports that she does not use drugs.  family history includes Anemia in her sister; Colon polyps in her father and sister; Depression (age of onset: 71) in her father; Diabetes in her maternal grandmother; Lung cancer in her sister; Lymphoma (age of onset: 4) in her mother.  No Known Allergies     PHYSICAL EXAMINATION: Vital signs: BP 120/86   Pulse 82   Ht 5\' 3"  (1.6 m)   Wt 145 lb 2 oz (65.8 kg)   BMI 25.71 kg/m   Constitutional: generally well-appearing, no acute distress Psychiatric: alert and oriented x3, cooperative Eyes: extraocular movements intact, anicteric, conjunctiva pink Mouth: oral pharynx moist, no lesions Neck: supple no lymphadenopathy Cardiovascular: heart regular rate and rhythm, no murmur Lungs: clear to auscultation bilaterally Abdomen: soft, nontender, nondistended, no obvious ascites, no peritoneal signs, normal bowel sounds, no organomegaly Rectal:deferred until colonoscopy Extremities: no clubbing, cyanosis, or lower extremity edema bilaterally Skin: no lesions on visible extremities Neuro: No focal deficits. Cranial  nerves intact  ASSESSMENT:  #1. History of multiple and advanced colon polyps. Slightly overdue for surveillance #2. Multiple medical problems including coronary artery disease with prior coronary artery stent placement and chronic Plavix therapy. Stable #3. Chronic tobacco abuse. Ongoing #4. History of iron deficiency anemia. Normal  blood counts on iron supplementation #5. GERD. Stable #6. GI AVMs  PLAN:  #1. Schedule surveillance colonoscopy. The patient is high-risk given her comorbidities and the need to address Plavix therapy.The nature of the procedure, as well as the risks, benefits, and alternatives were carefully and thoroughly reviewed with the patient. Ample time for discussion and questions allowed. The patient understood, was satisfied, and agreed to proceed. #2. Hold Plavix 1 week prior to the procedure as she likely need therapeutics. This was approved last year. No interval change her cardiac status #3. Stop smoking #4. Ongoing general medical care with Dr. Forde Dandy #5. Continue PPI for GERD and upper GI mucosal protection. An continue iron given history of iron deficiency anemia and AVMs

## 2018-02-06 ENCOUNTER — Encounter: Payer: Self-pay | Admitting: Internal Medicine

## 2018-02-12 ENCOUNTER — Encounter: Payer: Self-pay | Admitting: Cardiovascular Disease

## 2018-02-13 ENCOUNTER — Ambulatory Visit (AMBULATORY_SURGERY_CENTER): Payer: PPO | Admitting: Internal Medicine

## 2018-02-13 ENCOUNTER — Encounter: Payer: Self-pay | Admitting: Internal Medicine

## 2018-02-13 VITALS — BP 114/63 | HR 68 | Temp 97.7°F | Resp 17 | Ht 63.0 in | Wt 145.0 lb

## 2018-02-13 DIAGNOSIS — D120 Benign neoplasm of cecum: Secondary | ICD-10-CM | POA: Diagnosis not present

## 2018-02-13 DIAGNOSIS — Z86010 Personal history of colonic polyps: Secondary | ICD-10-CM

## 2018-02-13 DIAGNOSIS — D123 Benign neoplasm of transverse colon: Secondary | ICD-10-CM

## 2018-02-13 DIAGNOSIS — Z1211 Encounter for screening for malignant neoplasm of colon: Secondary | ICD-10-CM | POA: Diagnosis not present

## 2018-02-13 MED ORDER — SODIUM CHLORIDE 0.9 % IV SOLN: 500.0000 mL | Freq: Once | INTRAVENOUS | Status: DC

## 2018-02-13 NOTE — Progress Notes (Signed)
To PACU, VSS. Report to [email or web address] 

## 2018-02-13 NOTE — Op Note (Signed)
[facility] Patient Name: [name] Procedure Date: 02/13/2018 10:15 AM MRN: [id number] Endoscopist: Docia Chuck. Henrene Pastor , MD Age: 67 Referring MD:  Date of Birth: 06-Jul-1950 Gender: Female Account #: [id number] Procedure:                Colonoscopy with snare polypectomy x 2; with                            biopsies Indications:              High risk colon cancer surveillance: Personal                            history of adenoma (10 mm or greater in size), High                            risk colon cancer surveillance: Personal history of                            multiple (3 or more) adenomas. prior exams                            2015,16,17,18 Medicines:                Monitored Anesthesia Care Procedure:                Pre-Anesthesia Assessment:                           - Prior to the procedure, a History and Physical                            was performed, and patient medications and                            allergies were reviewed. The patient's tolerance of                            previous anesthesia was also reviewed. The risks                            and benefits of the procedure and the sedation                            options and risks were discussed with the patient.                            All questions were answered, and informed consent                            was obtained. Prior Anticoagulants: The patient has                            taken Plavix (clopidogrel), last dose was 7 days  prior to procedure. ASA Grade Assessment: III - A                            patient with severe systemic disease. After                            reviewing the risks and benefits, the patient was                            deemed in satisfactory condition to undergo the                            procedure.                           After obtaining informed consent, the colonoscope                            was passed under  direct vision. Throughout the                            procedure, the patient's blood pressure, pulse, and                            oxygen saturations were monitored continuously. The                            Model CF-HQ190L [id number]) scope was introduced                            through the anus and advanced to the the cecum,                            identified by appendiceal orifice and ileocecal                            valve. The ileocecal valve, appendiceal orifice,                            and rectum were photographed. The quality of the                            bowel preparation was excellent. The colonoscopy                            was performed without difficulty. The patient                            tolerated the procedure well. The bowel preparation                            used was SUPREP. Scope In: 10:21:58 AM Scope Out: 10:41:52 AM Scope Withdrawal Time: 0 hours 15 minutes 0 seconds  Total Procedure Duration: 0 hours 19 minutes 54 seconds  Findings:                 The area of prior polypectomy at the cecum was                            biopsied. 5 mm polypoid tissue in this region was                            then treated with cold snare and tissue                            cauterization. There was a 4 mm transverse colon                            polyp removed with cold snare. Resection and                            retrieval were complete.                           Diverticula were found in the sigmoid colon. Mild                            melanosis.                           The entire examined colon appeared normal on direct                            and retroflexion views. Internal hemorrhoida. Complications:            No immediate complications. Estimated blood loss:                            None. Estimated Blood Loss:     Estimated blood loss: none. Impression:               - One residual 5 mm polyp in the cecum, removed                             with a cold and hot snare. Resected and retrieved.                            Cold bx prior polypectmy site.                           - Diverticulosis in the sigmoid colon, melanosis,                            hemorrhoids.                           - The entire examined colon is normal on direct and                            retroflexion views.  Recommendation:           - Repeat colonoscopy in 2 years for surveillance.                           - Resume Plavix (clopidogrel) today at prior dose.                           - Patient has a contact number available for                            emergencies. The signs and symptoms of potential                            delayed complications were discussed with the                            patient. Return to normal activities tomorrow.                            Written discharge instructions were provided to the                            patient.                           - Resume previous diet.                           - Continue present medications.                           - Await pathology results. Docia Chuck. Henrene Pastor, MD 02/13/2018 10:53:54 AM This report has been signed electronically.

## 2018-02-13 NOTE — Progress Notes (Signed)
Called to room to assist during endoscopic procedure.  Patient ID and intended procedure confirmed with present staff. Received instructions for my participation in the procedure from the performing physician.  

## 2018-02-13 NOTE — Patient Instructions (Signed)
Impression/Recommendations:  Polyp Handout given to patient. Diverticulosis handout given to patient.  Repeat colonoscopy in 2 years for surveillance.  Resume Plavix (clopidogrel) today at prior dose.  Resume previous diet. Continue present medications.  YOU HAD AN ENDOSCOPIC PROCEDURE TODAY AT [facility]:   Refer to the procedure report that was given to you for any specific questions about what was found during the examination.  If the procedure report does not answer your questions, please call your gastroenterologist to clarify.  If you requested that your care partner not be given the details of your procedure findings, then the procedure report has been included in a sealed envelope for you to review at your convenience later.  YOU SHOULD EXPECT: Some feelings of bloating in the abdomen. Passage of more gas than usual.  Walking can help get rid of the air that was put into your GI tract during the procedure and reduce the bloating. If you had a lower endoscopy (such as a colonoscopy or flexible sigmoidoscopy) you may notice spotting of blood in your stool or on the toilet paper. If you underwent a bowel prep for your procedure, you may not have a normal bowel movement for a few days.  Please Note:  You might notice some irritation and congestion in your nose or some drainage.  This is from the oxygen used during your procedure.  There is no need for concern and it should clear up in a day or so.  SYMPTOMS TO REPORT IMMEDIATELY:   Following lower endoscopy (colonoscopy or flexible sigmoidoscopy):  Excessive amounts of blood in the stool  Significant tenderness or worsening of abdominal pains  Swelling of the abdomen that is new, acute  Fever of 100F or higher For urgent or emergent issues, a gastroenterologist can be reached at any hour by calling [phone].   DIET:  We do recommend a small meal at first, but then you may proceed to your regular diet.  Drink  plenty of fluids but you should avoid alcoholic beverages for 24 hours.  ACTIVITY:  You should plan to take it easy for the rest of today and you should NOT DRIVE or use heavy machinery until tomorrow (because of the sedation medicines used during the test).    FOLLOW UP: Our staff will call the number listed on your records the next business day following your procedure to check on you and address any questions or concerns that you may have regarding the information given to you following your procedure. If we do not reach you, we will leave a message.  However, if you are feeling well and you are not experiencing any problems, there is no need to return our call.  We will assume that you have returned to your regular daily activities without incident.  If any biopsies were taken you will be contacted by phone or by letter within the next 1-3 weeks.  Please call us at [phone] if you have not heard about the biopsies in 3 weeks.    SIGNATURES/CONFIDENTIALITY: You and/or your care partner have signed paperwork which will be entered into your electronic medical record.  These signatures attest to the fact that that the information above on your After Visit Summary has been reviewed and is understood.  Full responsibility of the confidentiality of this discharge information lies with you and/or your care-partner.

## 2018-02-13 NOTE — Progress Notes (Signed)
Pt's states no medical or surgical changes since previsit or office visit. 

## 2018-02-16 ENCOUNTER — Telehealth: Payer: Self-pay

## 2018-02-16 NOTE — Telephone Encounter (Signed)
  Follow up Call-  Call back number 02/13/2018 09/26/2016 02/19/2016  Post procedure Call Back phone  # [phone]  Permission to leave phone message Yes Yes Yes  Some recent data might be hidden     Patient questions:  Do you have a fever, pain , or abdominal swelling? No. Pain Score  0 *  Have you tolerated food without any problems? Yes.    Have you been able to return to your normal activities? Yes.    Do you have any questions about your discharge instructions: Diet   No. Medications  No. Follow up visit  No.  Do you have questions or concerns about your Care? No.  Actions: * If pain score is 4 or above: No action needed, pain <4.

## 2018-02-17 ENCOUNTER — Encounter: Payer: Self-pay | Admitting: Internal Medicine

## 2018-02-17 NOTE — Progress Notes (Signed)
Patient ID: [name], female   DOB: Jul 31, 1950, 67 y.o.   MRN: [id number]   67 y.o. anterior wall MI 1998 Rx with BMS to LAD . Edge restenosis Rx re intervention 2008 Quit smoking since then. Echo done 08/16/13 with EF 55-60% mild MR Has had anemia with polypectomy by Dr Henrene Pastor 2017 and again September of this year . BP tends to run higher in office Than home.    No chest pain compliant with meds BP readings home normal white coat in office   I am seeing her husband now who has history of DCM and stage 4 renal cell carcinoma   No chest pain. HR low today no issues after repeat polypectomy 02/13/18 Having neuropathic symptoms in legs and especially arms RUE worse  Started with "pinched" nerve   ROS: Denies fever, malais, weight loss, blurry vision, decreased visual acuity, cough, sputum, SOB, hemoptysis, pleuritic pain, palpitaitons, heartburn, abdominal pain, melena, lower extremity edema, claudication, or rash.  All other systems reviewed and negative  General: BP 138/88   Pulse 80   Ht 5\' 3"  (1.6 m)   Wt 145 lb (65.8 kg)   SpO2 99%   BMI 25.69 kg/m  Affect appropriate Healthy:  Appears older than  stated age HEENT: normal Neck supple with no adenopathy JVP normal no bruits no thyromegaly Lungs clear with no wheezing and good diaphragmatic motion Heart:  S1/S2 no murmur, no rub, gallop or click PMI normal Abdomen: benighn, BS positve, no tenderness, no AAA no bruit.  No HSM or HJR Distal pulses intact with no bruits No edema Neuro non-focal Skin warm and dry No muscular weakness    Current Outpatient Medications  Medication Sig Dispense Refill  . carvedilol (COREG) 12.5 MG tablet Take 1 tablet (12.5 mg total) by mouth 2 (two) times daily with a meal. 180 tablet 3  . clopidogrel (PLAVIX) 75 MG tablet TAKE 1 TABLET BY MOUTH EVERY DAY 30 tablet 8  . docusate sodium (EQUATE STOOL SOFTENER) 100 MG capsule Take 100 mg by mouth 2 (two) times daily.    . iron polysaccharides  (NIFEREX) 150 MG capsule Take 150 mg by mouth daily.    Marland Kitchen losartan-hydrochlorothiazide (HYZAAR) 100-25 MG tablet Take 1 tablet by mouth daily. 90 tablet 3  . nitroGLYCERIN (NITROSTAT) 0.4 MG SL tablet Place 1 tablet (0.4 mg total) under the tongue every 5 (five) minutes as needed for chest pain. 25 tablet 2  . pantoprazole (PROTONIX) 40 MG tablet Take 40 mg by mouth daily.    . simvastatin (ZOCOR) 40 MG tablet TAKE 1 TABLET(40 MG) BY MOUTH DAILY AT 6 PM 90 tablet 3   No current facility-administered medications for this visit.     Allergies  Patient has no known allergies.  Electrocardiogram:  08/11/17 SR rate 68 normal   Assessment and Plan  CAD: Stable with no angina and good activity level.  Continue medical Rx New nitro called in LAD stent in 98 with re intervention 2008 will decrease coreg to 6.25 bid due to bradycardia   HTN: Well controlled.  Continue current medications and low sodium Dash type diet.   Home readings normal white coat component   Chol:  On statin labs with [facility]         Anemia:  On iron improved no bleeding  02/19/16 EGD normal colonoscopy with removal of 8 polyps Dr Henrene Pastor did repeat Colonoscopy 02/13/18 and removed 2 more polyps   Bruit : duplex 01/30/16 plaque no stenosis  observe   Depression: Sister died Sep 02, 2016  husband with DCM and renal cell cancer PRN valium   Neuropathy:  F/u primary will likely need cervical MRI    Jenkins Rouge

## 2018-02-24 ENCOUNTER — Other Ambulatory Visit: Payer: Self-pay | Admitting: Cardiovascular Disease

## 2018-02-25 ENCOUNTER — Encounter: Payer: Self-pay | Admitting: Cardiovascular Disease

## 2018-02-25 ENCOUNTER — Ambulatory Visit: Payer: PPO | Admitting: Cardiovascular Disease

## 2018-02-25 VITALS — BP 138/88 | HR 80 | Ht 63.0 in | Wt 145.0 lb

## 2018-02-25 DIAGNOSIS — I10 Essential (primary) hypertension: Secondary | ICD-10-CM | POA: Diagnosis not present

## 2018-02-25 DIAGNOSIS — R0989 Other specified symptoms and signs involving the circulatory and respiratory systems: Secondary | ICD-10-CM | POA: Diagnosis not present

## 2018-02-25 DIAGNOSIS — I2583 Coronary atherosclerosis due to lipid rich plaque: Secondary | ICD-10-CM | POA: Diagnosis not present

## 2018-02-25 DIAGNOSIS — I2510 Atherosclerotic heart disease of native coronary artery without angina pectoris: Secondary | ICD-10-CM

## 2018-02-25 MED ORDER — CARVEDILOL 6.25 MG PO TABS: 6.2500 mg | ORAL_TABLET | Freq: Two times a day (BID) | ORAL | 3 refills | Status: DC

## 2018-02-25 NOTE — Telephone Encounter (Signed)
Outpatient Medication Detail    Disp Refills Start End   carvedilol (COREG) 6.25 MG tablet 180 tablet 3 02/25/2018    Sig - Route: Take 1 tablet (6.25 mg total) by mouth 2 (two) times daily. - Oral   Sent to pharmacy as: carvedilol (COREG) 6.25 MG tablet   E-Prescribing Status: Receipt confirmed by pharmacy (02/25/2018 9:36 AM EDT)   Pharmacy   [id number] - [place], [place] AT [place]

## 2018-02-25 NOTE — Patient Instructions (Addendum)
Medication Instructions:  Your physician has recommended you make the following change in your medication:  1-Decrease Carvedilol 6.25 mg by mouth twice daily  Labwork: NONE  Testing/Procedures: NONE  Follow-Up: Your physician wants you to follow-up in: 3 months with Dr. Johnsie Cancel.   If you need a refill on your cardiac medications before your next appointment, please call your pharmacy.

## 2018-03-17 DIAGNOSIS — Z6825 Body mass index (BMI) 25.0-25.9, adult: Secondary | ICD-10-CM | POA: Diagnosis not present

## 2018-03-17 DIAGNOSIS — R202 Paresthesia of skin: Secondary | ICD-10-CM | POA: Diagnosis not present

## 2018-03-17 DIAGNOSIS — E7849 Other hyperlipidemia: Secondary | ICD-10-CM | POA: Diagnosis not present

## 2018-03-17 DIAGNOSIS — I10 Essential (primary) hypertension: Secondary | ICD-10-CM | POA: Diagnosis not present

## 2018-03-17 DIAGNOSIS — Z23 Encounter for immunization: Secondary | ICD-10-CM | POA: Diagnosis not present

## 2018-03-17 DIAGNOSIS — I779 Disorder of arteries and arterioles, unspecified: Secondary | ICD-10-CM | POA: Diagnosis not present

## 2018-03-17 DIAGNOSIS — I2510 Atherosclerotic heart disease of native coronary artery without angina pectoris: Secondary | ICD-10-CM | POA: Diagnosis not present

## 2018-03-17 DIAGNOSIS — D126 Benign neoplasm of colon, unspecified: Secondary | ICD-10-CM | POA: Diagnosis not present

## 2018-03-25 ENCOUNTER — Other Ambulatory Visit: Payer: Self-pay | Admitting: Endocrinology

## 2018-03-25 DIAGNOSIS — I739 Peripheral vascular disease, unspecified: Principal | ICD-10-CM

## 2018-03-25 DIAGNOSIS — I779 Disorder of arteries and arterioles, unspecified: Secondary | ICD-10-CM

## 2018-03-26 DIAGNOSIS — D6489 Other specified anemias: Secondary | ICD-10-CM | POA: Diagnosis not present

## 2018-03-28 ENCOUNTER — Other Ambulatory Visit: Payer: Self-pay | Admitting: Cardiovascular Disease

## 2018-03-31 ENCOUNTER — Ambulatory Visit
Admission: RE | Admit: 2018-03-31 | Discharge: 2018-03-31 | Disposition: A | Discharge status: Home / Self Care | Payer: PPO | Source: Ambulatory Visit | Attending: Endocrinology | Admitting: Endocrinology

## 2018-03-31 DIAGNOSIS — I779 Disorder of arteries and arterioles, unspecified: Secondary | ICD-10-CM

## 2018-03-31 DIAGNOSIS — I739 Peripheral vascular disease, unspecified: Principal | ICD-10-CM

## 2018-03-31 DIAGNOSIS — I6523 Occlusion and stenosis of bilateral carotid arteries: Secondary | ICD-10-CM | POA: Diagnosis not present

## 2018-04-02 DIAGNOSIS — D6489 Other specified anemias: Secondary | ICD-10-CM | POA: Diagnosis not present

## 2018-04-09 DIAGNOSIS — D6489 Other specified anemias: Secondary | ICD-10-CM | POA: Diagnosis not present

## 2018-04-16 DIAGNOSIS — D6489 Other specified anemias: Secondary | ICD-10-CM | POA: Diagnosis not present

## 2018-05-13 DIAGNOSIS — R202 Paresthesia of skin: Secondary | ICD-10-CM | POA: Diagnosis not present

## 2018-05-24 NOTE — Progress Notes (Signed)
Patient ID: [name], female   DOB: 1950-07-07, 67 y.o.   MRN: [id number]     67 y.o. anterior wall MI 1998 Rx with BMS to LAD . Edge restenosis Rx re intervention 2008 Quit smoking since then. Echo done 08/16/13 with EF 55-60% mild MR Has had anemia with polypectomy by Dr Henrene Pastor 2017 and again September 2018. 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  All other systems reviewed and negative  General: BP 138/82   Pulse 84   Ht 5\' 3"  (1.6 m)   Wt 146 lb 12.8 oz (66.6 kg)   SpO2 98%   BMI 26.00 kg/m  Affect appropriate Healthy:  Appears older than  stated age 10: normal Neck supple with no adenopathy JVP normal right  bruits no thyromegaly Lungs clear with no wheezing and good diaphragmatic motion Heart:  S1/S2 no murmur, no rub, gallop or click PMI normal Abdomen: benighn, BS positve, no tenderness, no AAA no bruit.  No HSM or HJR Distal pulses intact with no bruits No edema Neuro non-focal Skin warm and dry No muscular weakness    Current Outpatient Medications  Medication Sig Dispense Refill  . carvedilol (COREG) 6.25 MG tablet Take 1 tablet (6.25 mg total) by mouth 2 (two) times daily. 180 tablet 3  . clopidogrel (PLAVIX) 75 MG tablet TAKE 1 TABLET BY MOUTH EVERY DAY 30 tablet 9  . docusate sodium (EQUATE STOOL SOFTENER) 100 MG capsule Take 100 mg by mouth 2 (two) times daily.    . iron polysaccharides  (NIFEREX) 150 MG capsule Take 150 mg by mouth daily.    Marland Kitchen losartan-hydrochlorothiazide (HYZAAR) 100-25 MG tablet Take 1 tablet by mouth daily. 90 tablet 3  . nitroGLYCERIN (NITROSTAT) 0.4 MG SL tablet Place 1 tablet (0.4 mg total) under the tongue every 5 (five) minutes as needed for chest pain. 25 tablet 2  . pantoprazole (PROTONIX) 40 MG tablet Take 40 mg by mouth daily.    . simvastatin (ZOCOR) 40 MG tablet TAKE 1 TABLET(40 MG) BY MOUTH DAILY AT 6 PM 90 tablet 3   No current facility-administered medications for this visit.     Allergies  Patient has no known allergies.  Electrocardiogram:  08/11/17 SR rate 68 normal   Assessment and Plan  CAD: Stable with no angina and good activity level.  Continue medical Rx New nitro called in LAD stent in 98 with re intervention 2008 low dose beta blocker due to bradycardia   HTN: Well controlled.  Continue current medications and low sodium Dash type diet.   Home readings normal white coat component   Chol:  On statin labs with [facility]         Anemia:  On iron improved no bleeding  02/19/16 EGD normal colonoscopy with removal of 8 polyps Dr Henrene Pastor did repeat Colonoscopy 02/13/18 and removed 2 more polyps   Bruit : duplex 03/31/18 < 50% bilateral ICA stenosis repeat October 2020  Depression: Sister died 2018  husband with DCM and renal cell cancer PRN valium   Neuropathy:  F/u primary will likely need cervical MRI and f/u with neuro B12 shots not helped   [employer]

## 2018-05-29 ENCOUNTER — Encounter: Payer: Self-pay | Admitting: Cardiovascular Disease

## 2018-05-29 ENCOUNTER — Ambulatory Visit: Payer: PPO | Admitting: Cardiovascular Disease

## 2018-05-29 VITALS — BP 138/82 | HR 84 | Ht 63.0 in | Wt 146.8 lb

## 2018-05-29 DIAGNOSIS — I2583 Coronary atherosclerosis due to lipid rich plaque: Secondary | ICD-10-CM | POA: Diagnosis not present

## 2018-05-29 DIAGNOSIS — I2510 Atherosclerotic heart disease of native coronary artery without angina pectoris: Secondary | ICD-10-CM | POA: Diagnosis not present

## 2018-05-29 DIAGNOSIS — I10 Essential (primary) hypertension: Secondary | ICD-10-CM | POA: Diagnosis not present

## 2018-05-29 DIAGNOSIS — R0989 Other specified symptoms and signs involving the circulatory and respiratory systems: Secondary | ICD-10-CM | POA: Diagnosis not present

## 2018-05-29 NOTE — Patient Instructions (Signed)

## 2018-06-18 DIAGNOSIS — D649 Anemia, unspecified: Secondary | ICD-10-CM | POA: Diagnosis not present

## 2018-07-09 ENCOUNTER — Encounter: Payer: Self-pay | Admitting: *Deleted

## 2018-07-13 ENCOUNTER — Encounter: Payer: Self-pay | Admitting: Neurology

## 2018-07-13 ENCOUNTER — Ambulatory Visit: Payer: PPO | Admitting: Neurology

## 2018-07-13 VITALS — BP 138/62 | HR 80 | Ht 63.0 in | Wt 141.0 lb

## 2018-07-13 DIAGNOSIS — R269 Unspecified abnormalities of gait and mobility: Secondary | ICD-10-CM

## 2018-07-13 DIAGNOSIS — R202 Paresthesia of skin: Secondary | ICD-10-CM | POA: Diagnosis not present

## 2018-07-13 DIAGNOSIS — G3281 Cerebellar ataxia in diseases classified elsewhere: Secondary | ICD-10-CM | POA: Diagnosis not present

## 2018-07-13 NOTE — Progress Notes (Signed)
PATIENT: [name] DOB: 23-Dec-1950  Chief Complaint  Patient presents with  . Numbness    She is here with her husband, [name].  Reports constant numbness/tingling in her bilateral arms, hands, legs and feet.  Her symptoms have been present for the last eight months.  States her B12 was low but she has been getting B12 injections at her PCP office.   Marland Kitchen PCP    Reynold Bowen, MD     HISTORICAL  [name] is a 68 years old female, seen in request by her primary care physician Dr. Reynold Bowen, for evaluation of numbness, initial evaluation was on Jul 13 2018.  She is accompanied by her husband [name] at today's visit,  I have reviewed and summarized the referring note from the referring physician.  She had a past medical history of hypertension, hyperlipidemia,  vitamin B12 deficiency, is receiving B12 supplement, also have a history of coronary artery disease, s/p stent,  tobacco abuse.  In July 2019, without clear triggers, she began to notice right shoulder pain, radiating pain to right upper extremity, gradually getting worse over the past 8 months, now has subjective weakness of right hand, felt right hand constant numbness, as if it is swollen, 2 months after symptom onset, she also noticed left shoulder pain, radiating paresthesia to left upper extremity and bilateral feet and lower extremity numbness. "As if my whole body is numb"  She has mild unsteady gait, but denies bowel bladder incontinence denies significant pain, no neck pain  Laboratory evaluations in October 2019: Vitamin B12 less than 150, normal CMP creatinine of 0.7, TSH of 1.06,   REVIEW OF SYSTEMS: Full 14 system review of systems performed and notable only for appetite change, fatigue, shortness of breath, leg swelling, restless leg, apnea, frequent awakening, daytime sleepiness, snoring, environmental allergy, joint pain, back pain, aching muscles, muscle cramps, walking difficulty  All other review of  systems were negative.  ALLERGIES: No Known Allergies  HOME MEDICATIONS: Current Outpatient Medications  Medication Sig Dispense Refill  . carvedilol (COREG) 6.25 MG tablet Take 1 tablet (6.25 mg total) by mouth 2 (two) times daily. 180 tablet 3  . clopidogrel (PLAVIX) 75 MG tablet TAKE 1 TABLET BY MOUTH EVERY DAY 30 tablet 9  . docusate sodium (EQUATE STOOL SOFTENER) 100 MG capsule Take 100 mg by mouth 2 (two) times daily.    . iron polysaccharides (NIFEREX) 150 MG capsule Take 150 mg by mouth daily.    Marland Kitchen losartan-hydrochlorothiazide (HYZAAR) 100-25 MG tablet Take 1 tablet by mouth daily. 90 tablet 3  . nitroGLYCERIN (NITROSTAT) 0.4 MG SL tablet Place 1 tablet (0.4 mg total) under the tongue every 5 (five) minutes as needed for chest pain. 25 tablet 2  . pantoprazole (PROTONIX) 40 MG tablet Take 40 mg by mouth daily.    . simvastatin (ZOCOR) 40 MG tablet TAKE 1 TABLET(40 MG) BY MOUTH DAILY AT 6 PM 90 tablet 3   No current facility-administered medications for this visit.     PAST MEDICAL HISTORY: Past Medical History:  Diagnosis Date  . ABDOMINAL AORTIC ANEURYSM   . Anemia   . Arthritis    HANDS  . Blood transfusion without reported diagnosis    AUGUST 5TH 2017 x2   . CAD   . CONGESTIVE HEART FAILURE, SYSTOLIC, CHRONIC   . CT, CHEST, ABNORMAL   . GERD (gastroesophageal reflux disease)   . Heart attack ([place])    x 2  . History of  colon polyps 01/05/2014   adenomatous polyps  . HYPERLIPIDEMIA   . HYPERTENSION   . ISCHEMIC CARDIOMYOPATHY   . Lower GI bleed 01/2014  . 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 Numbness and tingling   . 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 Paresthesia of both hands   . TOBACCO ABUSE     PAST SURGICAL HISTORY: Past Surgical History:  Procedure Laterality Date  . bare metal stent placement     left anterior descending artery x 2  . COLONOSCOPY N/A 01/05/2014   Procedure: COLONOSCOPY;  Surgeon: Jerene Bears, MD;   Location: [facility];  Service: Endoscopy;  Laterality: N/A;  . COLONOSCOPY N/A 01/06/2014   Procedure: COLONOSCOPY;  Surgeon: Jerene Bears, MD;  Location: [facility];  Service: Endoscopy;  Laterality: N/A;  . COLONOSCOPY    . POLYPECTOMY      FAMILY HISTORY: Family History  Problem Relation Age of Onset  . Lymphoma Mother 1  . Colon polyps Father   . Depression Father 52       died of suicide  . Heart attack Father   . Diabetes Maternal Grandmother   . Anemia Sister   . Colon polyps Sister   . Lung cancer Sister   . Colon cancer Neg Hx   . Esophageal cancer Neg Hx   . Rectal cancer Neg Hx   . Stomach cancer Neg Hx     SOCIAL HISTORY: Social History   Socioeconomic History  . Marital status: Married    Spouse name: [name]  . Number of children: 0  . Years of education: 65  . Highest education level: Not on file  Occupational History  . Occupation: retired  Scientific laboratory technician  . Financial resource strain: Not on file  . Food insecurity:    Worry: Not on file    Inability: Not on file  . Transportation needs:    Medical: Not on file    Non-medical: Not on file  Tobacco Use  . Smoking status: Former Smoker    Types: Cigarettes, E-cigarettes    Last attempt to quit: 2013    Years since quitting: 7.1  . Smokeless tobacco: Never Used  Substance and Sexual Activity  . Alcohol use: Not Currently  . Drug use: No  . Sexual activity: Not on file  Lifestyle  . Physical activity:    Days per week: Not on file    Minutes per session: Not on file  . Stress: Not on file  Relationships  . Social connections:    Talks on phone: Not on file    Gets together: Not on file    Attends religious service: Not on file    Active member of club or organization: Not on file    Attends meetings of clubs or organizations: Not on file    Relationship status: Not on file  . Intimate partner violence:    Fear of current or ex partner: Not on file    Emotionally abused: Not on file     Physically abused: Not on file    Forced sexual activity: Not on file  Other Topics Concern  . Not on file  Social History Narrative   Right-handed.   Three cups caffeine daily.   Lives at home with her husband.     PHYSICAL EXAM   Vitals:   07/13/18 1305  Weight: 141 lb (64 kg)  Height: 5\' 3"  (1.6 m)    Not recorded  Body mass index is 24.98 kg/m.  PHYSICAL EXAMNIATION:  Gen: NAD, conversant, well nourised, obese, well groomed                     Cardiovascular: Regular rate rhythm, no peripheral edema, warm, nontender. Eyes: Conjunctivae clear without exudates or hemorrhage Neck: Supple, no carotid bruits. Pulmonary: Clear to auscultation bilaterally   NEUROLOGICAL EXAM:  MENTAL STATUS: Speech:    Speech is normal; fluent and spontaneous with normal comprehension.  Cognition:     Orientation to time, place and person     Normal recent and remote memory     Normal Attention span and concentration     Normal Language, naming, repeating,spontaneous speech     Fund of knowledge   CRANIAL NERVES: CN II: Visual fields are full to confrontation. Fundoscopic exam is normal with sharp discs and no vascular changes. Pupils are round equal and briskly reactive to light. CN III, IV, VI: extraocular movement are normal. No ptosis. CN V: Facial sensation is intact to pinprick in all 3 divisions bilaterally. Corneal responses are intact.  CN VII: Face is symmetric with normal eye closure and smile. CN VIII: Hearing is normal to rubbing fingers CN IX, X: Palate elevates symmetrically. Phonation is normal. CN XI: Head turning and shoulder shrug are intact CN XII: Tongue is midline with normal movements and no atrophy.  MOTOR: There is no pronator drift of out-stretched arms. Muscle bulk and tone are normal. Muscle strength is normal.  REFLEXES: Reflexes are absent.. Plantar responses are flexor.  SENSORY: Length dependent decreased vibratory sensation to mid shin  level, preserved toe proprioception, mildly decreased to pinprick to ankle level  COORDINATION: Rapid alternating movements and fine finger movements are intact. There is no dysmetria on finger-to-nose and heel-knee-shin.    GAIT/STANCE: Posture is normal. Gait is steady with normal steps, base, arm swing, and turning. Heel and toe walking are normal.  Romberg is mildly positive  DIAGNOSTIC DATA (LABS, IMAGING, TESTING) - I reviewed patient records, labs, notes, testing and imaging myself where available.   ASSESSMENT AND PLAN  [name] is a 68 y.o. female   Paresthesia  Differentiation diagnosis peripheral neuropathy, need to rule out cervical radiculopathy/spondylitic myelopathy  EMG nerve conduction study  MRI of cervical spine   Marcial Pacas, M.D. Ph.D.  [facility] [address], [place], [place] [address] Ph: [phone] Fax: [phone]  CC: Reynold Bowen, MD

## 2018-07-14 ENCOUNTER — Telehealth: Payer: Self-pay | Admitting: Neurology

## 2018-07-14 NOTE — Telephone Encounter (Signed)
Health team order sent to GI. No auth they will reach out to the pt to schedule.  °

## 2018-07-14 NOTE — Telephone Encounter (Signed)
LVM for patient to be aware I left GI phone number of [phone] if she has not heard from them in the next 2-3 business days.

## 2018-07-21 DIAGNOSIS — D649 Anemia, unspecified: Secondary | ICD-10-CM | POA: Diagnosis not present

## 2018-07-26 ENCOUNTER — Ambulatory Visit
Admission: RE | Admit: 2018-07-26 | Discharge: 2018-07-26 | Disposition: A | Discharge status: Home / Self Care | Payer: PPO | Source: Ambulatory Visit | Attending: Neurology | Admitting: Neurology

## 2018-07-26 DIAGNOSIS — G3281 Cerebellar ataxia in diseases classified elsewhere: Secondary | ICD-10-CM

## 2018-07-27 ENCOUNTER — Telehealth: Payer: Self-pay | Admitting: Neurology

## 2018-07-27 NOTE — Telephone Encounter (Signed)
Spoke to her husband (on [place]).  He is aware of results, verbalized understanding and will provide them to the patient.  Provided our number to call back with any questions.  She will keep her pending NCV/EMG appt.  Copy of results faxed and confirmed to her PCP, Dr. Reynold Bowen, at [phone].

## 2018-07-27 NOTE — Telephone Encounter (Signed)
Please call patient, MRI of cervical spine showed no significant abnormalities. Incidental findings of small right thyroid 6 mm cyst,    IMPRESSION: This MRI of the cervical spine without contrast shows the following: 1.    The spinal cord appears normal. 2.    Mild multilevel degenerative changes as detailed above that do not lead to spinal stenosis or nerve root compression. 3.    6 mm cyst in the right hemithyroid. 4.    Mild ventricular enlargement noted on the sagittal images.

## 2018-08-10 ENCOUNTER — Ambulatory Visit (INDEPENDENT_AMBULATORY_CARE_PROVIDER_SITE_OTHER): Payer: PPO | Admitting: Neurology

## 2018-08-10 ENCOUNTER — Telehealth: Payer: Self-pay | Admitting: Neurology

## 2018-08-10 DIAGNOSIS — R269 Unspecified abnormalities of gait and mobility: Secondary | ICD-10-CM

## 2018-08-10 DIAGNOSIS — R202 Paresthesia of skin: Secondary | ICD-10-CM

## 2018-08-10 DIAGNOSIS — G3281 Cerebellar ataxia in diseases classified elsewhere: Secondary | ICD-10-CM

## 2018-08-10 NOTE — Procedures (Signed)
Full Name: [name] Gender: Female MRN #: [id number] Date of Birth: 11/28/1950    Visit Date: 08/10/2018 13:18 Age: 68 Years 79 Months Old Examining Physician: Marcial Pacas, MD  Referring Physician: Marcial Pacas, MD History: 68 year old female presented with few months history of numbness from neck down  Summary of the tests:  Nerve Conduction study: Right radial, sural, superfacial peroneal, median, ulnar sensory responses were absent. Right median, ulnar peroneal to EDB and tibial motor responses were normal.  Electromyography: Needle examination of right upper and lower extremity muscles, right cervical, lumbosacral paraspinal muscles were normal.  Conclusion: This is an abnormal study.  There is electrodiagnostic evidence of absent sensory responses on all the sensory nerves tested, suggestive of sensory neuronopathy,    ------------------------------- Marcial Pacas, M.D. PhD  [facility] [place], [place] [address] Tel: [phone] Fax: [phone]        [facility]    Nerve / Sites Muscle Latency Ref. Amplitude Ref. Rel Amp Segments Distance Velocity Ref. Area    ms ms mV mV %  cm m/s m/s mVms  R Median - APB     Wrist APB 4.4 ?4.4 8.3 ?4.0 100 Wrist - APB 7   26.4     Upper arm APB 9.1  7.7  92.4 Upper arm - Wrist 23 49 ?49 24.1  R Ulnar - ADM     Wrist ADM 3.3 ?3.3 10.4 ?6.0 100 Wrist - ADM 7   28.7     B.Elbow ADM 7.5  9.6  92.5 B.Elbow - Wrist 21 50 ?49 28.4     A.Elbow ADM 9.5  9.2  94.9 A.Elbow - B.Elbow 10 49 ?49 27.4         A.Elbow - Wrist      R Peroneal - EDB     Ankle EDB 5.9 ?6.5 6.3 ?2.0 100 Ankle - EDB 9   27.9     Fib head EDB 12.1  5.7  91.2 Fib head - Ankle 27 44 ?44 25.8     Pop fossa EDB 14.4  5.5  96.4 Pop fossa - Fib head 10 44 ?44 25.9         Pop fossa - Ankle      R Tibial - AH     Ankle AH 5.1 ?5.8 11.1 ?4.0 100 Ankle - AH 9   29.9     Pop fossa AH 13.4  8.5  76.4 Pop fossa - Ankle 35 42 ?41 28.2     SNC    Nerve / Sites Rec. Site Peak Lat Ref.  Amp Ref. Segments Distance    ms ms V V  cm  R Radial - Anatomical snuff box (Forearm)     Forearm Wrist NR ?2.9 NR ?15 Forearm - Wrist 10  R Sural - Ankle (Calf)     Calf Ankle NR ?4.4 NR ?6 Calf - Ankle 14  R Superficial peroneal - Ankle     Lat leg Ankle NR ?4.4 NR ?6 Lat leg - Ankle 14  R Median - Orthodromic (Dig II, Mid palm)     Dig II Wrist NR ?3.4 NR ?10 Dig II - Wrist 13  R Ulnar - Orthodromic, (Dig V, Mid palm)     Dig V Wrist NR ?3.1 NR ?5 Dig V - Wrist 74               F  Wave    Nerve F Lat  Ref.   ms ms  R Tibial - AH 50.7 ?56.0  R Ulnar - ADM 29.1 ?32.0         EMG       EMG Summary Table    Spontaneous MUAP Recruitment  Muscle IA Fib PSW Fasc Other Amp Dur. Poly Pattern  R. Tibialis anterior Normal None None None _______ Normal Normal Normal Normal  R. Tibialis posterior Normal None None None _______ Normal Normal Normal Normal  R. Peroneus longus Normal None None None _______ Normal Normal Normal Normal  R. Gastrocnemius (Medial head) Normal None None None _______ Normal Normal Normal Normal  R. Vastus lateralis Normal None None None _______ Normal Normal Normal Normal  R. Lumbar paraspinals (mid) Normal None None None _______ Normal Normal Normal Normal  R. Lumbar paraspinals (low) Normal None None None _______ Normal Normal Normal Normal  R. First dorsal interosseous Normal None None None _______ Normal Normal Normal Normal  R. Pronator teres Normal None None None _______ Normal Normal Normal Normal  R. Biceps brachii Normal None None None _______ Normal Normal Normal Normal  R. Deltoid Normal None None None _______ Normal Normal Normal Normal  R. Triceps brachii Normal None None None _______ Normal Normal Normal Normal  R. Brachioradialis Normal None None None _______ Normal Normal Normal Normal  R. Cervical paraspinals Normal None None None _______ Normal Normal Normal Normal

## 2018-08-10 NOTE — Telephone Encounter (Signed)
Health team order sent to GI. No auth they will reach out to the pt to schedule.  °

## 2018-08-10 NOTE — Progress Notes (Signed)
PATIENT: [name] DOB: [birth year]  No chief complaint on file.    HISTORICAL  [name] is a 68 years old female, seen in request by her primary care physician Dr. Reynold Bowen, for evaluation of numbness, initial evaluation was on Jul 13 2018.  She is accompanied by her husband [name] at today's visit,  I have reviewed and summarized the referring note from the referring physician.  She had a past medical history of hypertension, hyperlipidemia,  vitamin B12 deficiency, is receiving B12 supplement, also have a history of coronary artery disease, s/p stent,  tobacco abuse.  In July 2019, without clear triggers, she began to notice right shoulder pain, radiating pain to right upper extremity, gradually getting worse over the past 8 months, now has subjective weakness of right hand, felt right hand constant numbness, as if it is swollen, 2 months after symptom onset, she also noticed left shoulder pain, radiating paresthesia to left upper extremity and bilateral feet and lower extremity numbness. "As if my whole body is numb"  She has mild unsteady gait, but denies bowel bladder incontinence denies significant pain, no neck pain  Laboratory evaluations in October 2019: Vitamin B12 less than 150, normal CMP creatinine of 0.7, TSH of 1.06,  UPDATE August 10 2018: She continue complains of bilateral upper and lower extremity paresthesia, gait abnormality, no bowel and bladder incontinence  EMG nerve conduction study today showed absent sensory response all the nerves tested.  But well-preserved right median, ulnar, tibial, peroneal to EDB motor responses, with normal F-wave latency.   REVIEW OF SYSTEMS: Full 14 system review of systems performed and notable only for as above  All other review of systems were negative.  ALLERGIES: No Known Allergies  HOME MEDICATIONS: Current Outpatient Medications  Medication Sig Dispense Refill  . carvedilol (COREG) 6.25 MG tablet Take 1  tablet (6.25 mg total) by mouth 2 (two) times daily. 180 tablet 3  . clopidogrel (PLAVIX) 75 MG tablet TAKE 1 TABLET BY MOUTH EVERY DAY 30 tablet 9  . docusate sodium (EQUATE STOOL SOFTENER) 100 MG capsule Take 100 mg by mouth 2 (two) times daily.    . iron polysaccharides (NIFEREX) 150 MG capsule Take 150 mg by mouth daily.    Marland Kitchen losartan-hydrochlorothiazide (HYZAAR) 100-25 MG tablet Take 1 tablet by mouth daily. 90 tablet 3  . nitroGLYCERIN (NITROSTAT) 0.4 MG SL tablet Place 1 tablet (0.4 mg total) under the tongue every 5 (five) minutes as needed for chest pain. 25 tablet 2  . pantoprazole (PROTONIX) 40 MG tablet Take 40 mg by mouth daily.    . simvastatin (ZOCOR) 40 MG tablet TAKE 1 TABLET(40 MG) BY MOUTH DAILY AT 6 PM 90 tablet 3   No current facility-administered medications for this visit.     PAST MEDICAL HISTORY: Past Medical History:  Diagnosis Date  . ABDOMINAL AORTIC ANEURYSM   . Anemia   . Arthritis    HANDS  . Blood transfusion without reported diagnosis    AUGUST 5TH 2017 x2   . CAD   . CONGESTIVE HEART FAILURE, SYSTOLIC, CHRONIC   . CT, CHEST, ABNORMAL   . GERD (gastroesophageal reflux disease)   . Heart attack ([place])    x 2  . History of colon polyps 01/05/2014   adenomatous polyps  . HYPERLIPIDEMIA   . HYPERTENSION   . ISCHEMIC CARDIOMYOPATHY   . Lower GI bleed 01/2014  . 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 Numbness  and tingling   . 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 Paresthesia of both hands   . TOBACCO ABUSE     PAST SURGICAL HISTORY: Past Surgical History:  Procedure Laterality Date  . bare metal stent placement     left anterior descending artery x 2  . COLONOSCOPY N/A 01/05/2014   Procedure: COLONOSCOPY;  Surgeon: Jerene Bears, MD;  Location: [facility];  Service: Endoscopy;  Laterality: N/A;  . COLONOSCOPY N/A 01/06/2014   Procedure: COLONOSCOPY;  Surgeon: Jerene Bears, MD;  Location: [facility];  Service: Endoscopy;   Laterality: N/A;  . COLONOSCOPY    . POLYPECTOMY      FAMILY HISTORY: Family History  Problem Relation Age of Onset  . Lymphoma Mother 55  . Colon polyps Father   . Depression Father 68       died of suicide  . Heart attack Father   . Diabetes Maternal Grandmother   . Anemia Sister   . Colon polyps Sister   . Lung cancer Sister   . Colon cancer Neg Hx   . Esophageal cancer Neg Hx   . Rectal cancer Neg Hx   . Stomach cancer Neg Hx     SOCIAL HISTORY: Social History   Socioeconomic History  . Marital status: Married    Spouse name: Clair Gulling  . Number of children: 0  . Years of education: 15  . Highest education level: Not on file  Occupational History  . Occupation: retired  Scientific laboratory technician  . Financial resource strain: Not on file  . Food insecurity:    Worry: Not on file    Inability: Not on file  . Transportation needs:    Medical: Not on file    Non-medical: Not on file  Tobacco Use  . Smoking status: Former Smoker    Types: Cigarettes, E-cigarettes    Last attempt to quit: 2013    Years since quitting: 7.1  . Smokeless tobacco: Never Used  Substance and Sexual Activity  . Alcohol use: Not Currently  . Drug use: No  . Sexual activity: Not on file  Lifestyle  . Physical activity:    Days per week: Not on file    Minutes per session: Not on file  . Stress: Not on file  Relationships  . Social connections:    Talks on phone: Not on file    Gets together: Not on file    Attends religious service: Not on file    Active member of club or organization: Not on file    Attends meetings of clubs or organizations: Not on file    Relationship status: Not on file  . Intimate partner violence:    Fear of current or ex partner: Not on file    Emotionally abused: Not on file    Physically abused: Not on file    Forced sexual activity: Not on file  Other Topics Concern  . Not on file  Social History Narrative   Right-handed.   Three cups caffeine daily.   Lives at  home with her husband.     PHYSICAL EXAM   There were no vitals filed for this visit.  Not recorded      There is no height or weight on file to calculate BMI.  PHYSICAL EXAMNIATION:  Gen: NAD, conversant, well nourised, obese, well groomed                     Cardiovascular: Regular  rate rhythm, no peripheral edema, warm, nontender. Eyes: Conjunctivae clear without exudates or hemorrhage Neck: Supple, no carotid bruits. Pulmonary: Clear to auscultation bilaterally   NEUROLOGICAL EXAM:  MENTAL STATUS: Speech:    Speech is normal; fluent and spontaneous with normal comprehension.  Cognition:     Orientation to time, place and person     Normal recent and remote memory     Normal Attention span and concentration     Normal Language, naming, repeating,spontaneous speech     Fund of knowledge   CRANIAL NERVES: CN II: Visual fields are full to confrontation.  Pupils are round equal and briskly reactive to light. CN III, IV, VI: extraocular movement are normal. No ptosis. CN V: Facial sensation is intact to pinprick in all 3 divisions bilaterally. Corneal responses are intact.  CN VII: Face is symmetric with normal eye closure and smile. CN VIII: Hearing is normal to rubbing fingers CN IX, X: Palate elevates symmetrically. Phonation is normal. CN XI: Head turning and shoulder shrug are intact CN XII: Tongue is midline with normal movements and no atrophy.  MOTOR: There is no pronator drift of out-stretched arms. Muscle bulk and tone are normal. Muscle strength is normal.  REFLEXES: Reflexes are absent.. Plantar responses are flexor.  SENSORY: Length dependent decreased vibratory sensation to mid shin level, preserved toe proprioception, mildly decreased to pinprick to ankle level  COORDINATION: Rapid alternating movements and fine finger movements are intact. There is no dysmetria on finger-to-nose and heel-knee-shin.    GAIT/STANCE: Posture is normal. Gait is steady  with normal steps, base, arm swing, and turning. Heel and toe walking are normal.  Romberg is positive  DIAGNOSTIC DATA (LABS, IMAGING, TESTING) - I reviewed patient records, labs, notes, testing and imaging myself where available.   ASSESSMENT AND PLAN  DONETTE MAINWARING is a 68 y.o. female   Paresthesia  Both upper and lower extremity, absent sensory responses on all the sensory nerve tested,  areflexia,  Differentiation diagnosis incluse sensory neuronopathy,   MRI of cervical spine showed no significant abnormality  Proceed with MRI of thoracic, lumbar spine  Laboratory evaluations to rule out vitamin B6 toxicity, Sjogren's disease, paraneoplastic syndrome  May even consider lumbar puncture,   Marcial Pacas, M.D. Ph.D.  [facility] [address] [place], [place] [address] Ph: [phone] Fax: [phone]  CC: Reynold Bowen, MD

## 2018-08-11 DIAGNOSIS — R202 Paresthesia of skin: Secondary | ICD-10-CM | POA: Diagnosis not present

## 2018-08-13 NOTE — Progress Notes (Signed)
Labs normal, except vitamin D level is low. One of the proteins in her blood is high, IgG, mildly elevated and likely non-specific.  She should talk to PCP about Vit D. Supplementation. Dr. Krista Blue will be able review labs upon return, if anything else needs FU. One test result is pending. Lyme Ab negative, B12 lower normal range. Michel Bickers

## 2018-08-14 ENCOUNTER — Telehealth: Payer: Self-pay | Admitting: *Deleted

## 2018-08-14 NOTE — Telephone Encounter (Signed)
-----   Message from Hope Pigeon, RN sent at 08/14/2018  9:36 AM EDT -----  ----- Message ----- From: Star Age, MD Sent: 08/13/2018   5:36 PM EDT To: Desmond Lope, RN  Labs normal, except vitamin D level is low. One of the proteins in her blood is high, IgG, mildly elevated and likely non-specific.  She should talk to PCP about Vit D. Supplementation. Dr. Krista Blue will be able review labs upon return, if anything else needs FU. One test result is pending. Lyme Ab negative, B12 lower normal range. Michel Bickers

## 2018-08-14 NOTE — Telephone Encounter (Signed)
Called and spoke with husband (Jim-on DPR) about lab results per Dr. Rexene Alberts note. Pt aware Dr. Krista Blue out today and back next week. She will review and we will call back with next steps. She has MRI scheduled this Saturday.  They will f/u with PCP about low Vit D level.

## 2018-08-15 ENCOUNTER — Ambulatory Visit
Admission: RE | Admit: 2018-08-15 | Discharge: 2018-08-15 | Disposition: A | Discharge status: Home / Self Care | Payer: PPO | Source: Ambulatory Visit | Attending: Neurology | Admitting: Neurology

## 2018-08-15 ENCOUNTER — Other Ambulatory Visit: Payer: Self-pay

## 2018-08-15 DIAGNOSIS — G3281 Cerebellar ataxia in diseases classified elsewhere: Secondary | ICD-10-CM | POA: Diagnosis not present

## 2018-08-17 NOTE — Telephone Encounter (Signed)
Patient's husband( DPR verified) returning your phone call.

## 2018-08-17 NOTE — Telephone Encounter (Signed)
Please call patient, MRI of cervical, lumbar  and thoracic spine showed no significant abnormality  Laboratory evaluations showed low vitamin D 10, she should start over-the-counter vitamin D3 supplement 3000 units daily  Paraneoplastic panel is still pending.  If the paraneoplastic panel is non-revealingj, will consider lumbar puncture.

## 2018-08-17 NOTE — Telephone Encounter (Signed)
Left message requesting a return call.

## 2018-08-17 NOTE — Telephone Encounter (Signed)
Spoke to the patient's husband on [place].  He is aware of the results and recommendations below.  He will go ahead and start her vitamin D3 supplement.  He is aware we will call back with her other results, once available.

## 2018-08-21 LAB — ANA W/REFLEX IF POSITIVE: Anti Nuclear Antibody (ANA): NEGATIVE

## 2018-08-21 LAB — FERRITIN: Ferritin: 17 ng/mL (ref 15–150)

## 2018-08-21 LAB — VITAMIN B1: THIAMINE: 105.5 nmol/L (ref 66.5–200.0)

## 2018-08-21 LAB — CBC WITH DIFFERENTIAL
Basophils Absolute: 0 10*3/uL (ref 0.0–0.2)
Basos: 1 %
EOS (ABSOLUTE): 0.2 10*3/uL (ref 0.0–0.4)
Eos: 3 %
Hematocrit: 39.8 % (ref 34.0–46.6)
Hemoglobin: 13 g/dL (ref 11.1–15.9)
Immature Grans (Abs): 0 10*3/uL (ref 0.0–0.1)
Immature Granulocytes: 0 %
Lymphocytes Absolute: 1.4 10*3/uL (ref 0.7–3.1)
Lymphs: 24 %
MCH: 30.1 pg (ref 26.6–33.0)
MCHC: 32.7 g/dL (ref 31.5–35.7)
MCV: 92 fL (ref 79–97)
Monocytes Absolute: 0.5 10*3/uL (ref 0.1–0.9)
Monocytes: 8 %
Neutrophils Absolute: 3.6 10*3/uL (ref 1.4–7.0)
Neutrophils: 64 %
RBC: 4.32 x10E6/uL (ref 3.77–5.28)
RDW: 13.1 % (ref 11.7–15.4)
WBC: 5.7 10*3/uL (ref 3.4–10.8)

## 2018-08-21 LAB — COMPREHENSIVE METABOLIC PANEL
ALT: 10 IU/L (ref 0–32)
AST: 19 IU/L (ref 0–40)
Albumin/Globulin Ratio: 1.5 (ref 1.2–2.2)
Albumin: 4.6 g/dL (ref 3.8–4.8)
Alkaline Phosphatase: 59 IU/L (ref 39–117)
BUN/Creatinine Ratio: 7 — ABNORMAL LOW (ref 12–28)
BUN: 6 mg/dL — ABNORMAL LOW (ref 8–27)
Bilirubin Total: 0.5 mg/dL (ref 0.0–1.2)
CO2: 22 mmol/L (ref 20–29)
Calcium: 9.7 mg/dL (ref 8.7–10.3)
Chloride: 98 mmol/L (ref 96–106)
Creatinine, Ser: 0.84 mg/dL (ref 0.57–1.00)
GFR calc Af Amer: 84 mL/min/{1.73_m2} (ref >59)
GFR calc non Af Amer: 73 mL/min/{1.73_m2} (ref >59)
Globulin, Total: 3.1 g/dL (ref 1.5–4.5)
Glucose: 94 mg/dL (ref 65–99)
Potassium: 4.2 mmol/L (ref 3.5–5.2)
Sodium: 137 mmol/L (ref 134–144)

## 2018-08-21 LAB — PARANEOPLASTIC AUTOAB EVAL
ANTI-NEURONAL NUCL .AB, TYPE 1: NEGATIVE {titer} (ref <1:240)
AchR Ganglionic Neuronal Ab,S: 0 nmol/L (ref <=0.02)
Amphiphysin Ab,S: NEGATIVE titer
Anti-Glial Nuclear Ab, Type 2: NEGATIVE titer
Anti-Glial Nuclear Ab, Type 3: NEGATIVE titer
Anti-Glial Nuclear Ab,Type 1: NEGATIVE titer
CRMP-5 IgG, S: NEGATIVE titer
Calcium Channel, Ab N-Type: 0 nmol/L (ref <=0.03)
Calcium Channel, Ab P/Q-Type: 0.01 nmol/L (ref <=0.02)
Neuronal (V + G) K+ Channel Ab,S: 0 nmol/L (ref <=0.02)
Purkinje Cell Cytop.Ab Type 1: NEGATIVE titer
Purkinje Cell Cytop.Ab Type 2: NEGATIVE titer
Purkinje Cell Cytop.Ab Type Tr: NEGATIVE titer
Striational ( Striat.Muscle),Ab: NEGATIVE titer

## 2018-08-21 LAB — VITAMIN D 25 HYDROXY (VIT D DEFICIENCY, FRACTURES): Vit D, 25-Hydroxy: 10.9 ng/mL — ABNORMAL LOW (ref 30.0–100.0)

## 2018-08-21 LAB — IMMUNOFIXATION ELECTROPHORESIS
IGG (IMMUNOGLOBIN G), SERUM: 1623 mg/dL — AB (ref 700–1600)
IgA/Immunoglobulin A, Serum: 175 mg/dL (ref 87–352)
IgM (Immunoglobulin M), Srm: 214 mg/dL (ref 26–217)
Total Protein: 7.7 g/dL (ref 6.0–8.5)

## 2018-08-21 LAB — HEPATITIS PANEL, ACUTE
HEP B C IGM: NEGATIVE
Hep A IgM: NEGATIVE
Hep C Virus Ab: <0.1 s/co ratio (ref 0.0–0.9)
Hepatitis B Surface Ag: NEGATIVE

## 2018-08-21 LAB — HGB A1C W/O EAG: Hgb A1c MFr Bld: 5.6 % (ref 4.8–5.6)

## 2018-08-21 LAB — LYME AB/WESTERN BLOT REFLEX
LYME DISEASE AB, QUANT, IGM: <0.8 index (ref 0.00–0.79)
Lyme IgG/IgM Ab: <0.91 {ISR} (ref 0.00–0.90)

## 2018-08-21 LAB — VITAMIN B5: VITAMIN B5: 24.3 ng/mL (ref 12.9–253.1)

## 2018-08-21 LAB — VITAMIN B12: VITAMIN B 12: 341 pg/mL (ref 232–1245)

## 2018-08-21 LAB — RPR: RPR Ser Ql: NONREACTIVE

## 2018-08-21 LAB — COPPER, SERUM: COPPER: 127 ug/dL (ref 72–166)

## 2018-08-21 LAB — SEDIMENTATION RATE: Sed Rate: 31 mm/hr (ref 0–40)

## 2018-08-21 LAB — TSH: TSH: 1.42 u[IU]/mL (ref 0.450–4.500)

## 2018-08-21 LAB — ANGIOTENSIN CONVERTING ENZYME: Angio Convert Enzyme: 29 U/L (ref 14–82)

## 2018-08-21 LAB — CK: Total CK: 129 U/L (ref 24–173)

## 2018-08-21 LAB — HIV ANTIBODY (ROUTINE TESTING W REFLEX): HIV Screen 4th Generation wRfx: NONREACTIVE

## 2018-09-06 ENCOUNTER — Other Ambulatory Visit: Payer: Self-pay | Admitting: Cardiovascular Disease

## 2018-09-15 DIAGNOSIS — I10 Essential (primary) hypertension: Secondary | ICD-10-CM | POA: Diagnosis not present

## 2018-09-15 DIAGNOSIS — G119 Hereditary ataxia, unspecified: Secondary | ICD-10-CM | POA: Diagnosis not present

## 2018-09-15 DIAGNOSIS — E559 Vitamin D deficiency, unspecified: Secondary | ICD-10-CM | POA: Diagnosis not present

## 2018-09-15 DIAGNOSIS — I714 Abdominal aortic aneurysm, without rupture: Secondary | ICD-10-CM | POA: Diagnosis not present

## 2018-09-15 DIAGNOSIS — R634 Abnormal weight loss: Secondary | ICD-10-CM | POA: Diagnosis not present

## 2018-09-15 DIAGNOSIS — M5416 Radiculopathy, lumbar region: Secondary | ICD-10-CM | POA: Diagnosis not present

## 2018-09-15 DIAGNOSIS — R202 Paresthesia of skin: Secondary | ICD-10-CM | POA: Diagnosis not present

## 2018-09-15 DIAGNOSIS — D126 Benign neoplasm of colon, unspecified: Secondary | ICD-10-CM | POA: Diagnosis not present

## 2018-09-15 DIAGNOSIS — I779 Disorder of arteries and arterioles, unspecified: Secondary | ICD-10-CM | POA: Diagnosis not present

## 2018-09-15 DIAGNOSIS — I2510 Atherosclerotic heart disease of native coronary artery without angina pectoris: Secondary | ICD-10-CM | POA: Diagnosis not present

## 2018-09-15 DIAGNOSIS — D510 Vitamin B12 deficiency anemia due to intrinsic factor deficiency: Secondary | ICD-10-CM | POA: Diagnosis not present

## 2018-09-15 DIAGNOSIS — E785 Hyperlipidemia, unspecified: Secondary | ICD-10-CM | POA: Diagnosis not present

## 2018-10-20 DIAGNOSIS — R634 Abnormal weight loss: Secondary | ICD-10-CM | POA: Diagnosis not present

## 2018-11-16 ENCOUNTER — Telehealth: Payer: Self-pay

## 2018-11-16 NOTE — Telephone Encounter (Signed)
Virtual Visit Pre-Appointment Phone Call  "(Name), I am calling you today to discuss your upcoming appointment. We are currently trying to limit exposure to the virus that causes COVID-19 by seeing patients at home rather than in the office."  1. "What is the BEST phone number to call the day of the visit?" - include this in appointment notes  2. "Do you have or have access to (through a family member/friend) a smartphone with video capability that we can use for your visit?" a. If yes - list this number in appt notes as "cell" (if different from BEST phone #) and list the appointment type as a VIDEO visit in appointment notes b. If no - list the appointment type as a PHONE visit in appointment notes  3. Confirm consent - "In the setting of the current Covid19 crisis, you are scheduled for a (phone or video) visit with your provider on (date) at (time).  Just as we do with many in-office visits, in order for you to participate in this visit, we must obtain consent.  If you'd like, I can send this to your mychart (if signed up) or email for you to review.  Otherwise, I can obtain your verbal consent now.  All virtual visits are billed to your insurance company just like a normal visit would be.  By agreeing to a virtual visit, we'd like you to understand that the technology does not allow for your provider to perform an examination, and thus may limit your provider's ability to fully assess your condition. If your provider identifies any concerns that need to be evaluated in person, we will make arrangements to do so.  Finally, though the technology is pretty good, we cannot assure that it will always work on either your or our end, and in the setting of a video visit, we may have to convert it to a phone-only visit.  In either situation, we cannot ensure that we have a secure connection.  Are you willing to proceed?YES  4. Advise patient to be prepared - "Two hours prior to your appointment, go  ahead and check your blood pressure, pulse, oxygen saturation, and your weight (if you have the equipment to check those) and write them all down. When your visit starts, your provider will ask you for this information. If you have an Apple Watch or Kardia device, please plan to have heart rate information ready on the day of your appointment. Please have a pen and paper handy nearby the day of the visit as well."  5. Give patient instructions for MyChart download to smartphone OR [email or web address] as below if video visit (depending on what platform provider is using)  6. Inform patient they will receive a phone call 15 minutes prior to their appointment time (may be from unknown caller ID) so they should be prepared to answer    TELEPHONE CALL NOTE  MEGEN MADEWELL has been deemed a candidate for a follow-up tele-health visit to limit community exposure during the Covid-19 pandemic. I spoke with the patient via phone to ensure availability of phone/video source, confirm preferred email & phone number, and discuss instructions and expectations.  I reminded VERONIA LAPRISE to be prepared with any vital sign and/or heart rhythm information that could potentially be obtained via home monitoring, at the time of her visit. I reminded ALEC MCPHEE to expect a phone call prior to her visit.  Michaelyn Barter, RN 11/16/2018 3:27 PM  FULL LENGTH CONSENT FOR TELE-HEALTH VISIT   I hereby voluntarily request, consent and authorize [facility] and its employed or contracted physicians, physician assistants, nurse practitioners or other licensed health care professionals (the Practitioner), to provide me with telemedicine health care services (the "Services") as deemed necessary by the treating Practitioner. I acknowledge and consent to receive the Services by the Practitioner via telemedicine. I understand that the telemedicine visit will involve communicating with the Practitioner through live  audiovisual communication technology and the disclosure of certain medical information by electronic transmission. I acknowledge that I have been given the opportunity to request an in-person assessment or other available alternative prior to the telemedicine visit and am voluntarily participating in the telemedicine visit.  I understand that I have the right to withhold or withdraw my consent to the use of telemedicine in the course of my care at any time, without affecting my right to future care or treatment, and that the Practitioner or I may terminate the telemedicine visit at any time. I understand that I have the right to inspect all information obtained and/or recorded in the course of the telemedicine visit and may receive copies of available information for a reasonable fee.  I understand that some of the potential risks of receiving the Services via telemedicine include:  Marland Kitchen Delay or interruption in medical evaluation due to technological equipment failure or disruption; . Information transmitted may not be sufficient (e.g. poor resolution of images) to allow for appropriate medical decision making by the Practitioner; and/or  . In rare instances, security protocols could fail, causing a breach of personal health information.  Furthermore, I acknowledge that it is my responsibility to provide information about my medical history, conditions and care that is complete and accurate to the best of my ability. I acknowledge that Practitioner's advice, recommendations, and/or decision may be based on factors not within their control, such as incomplete or inaccurate data provided by me or distortions of diagnostic images or specimens that may result from electronic transmissions. I understand that the practice of medicine is not an exact science and that Practitioner makes no warranties or guarantees regarding treatment outcomes. I acknowledge that I will receive a copy of this consent concurrently upon  execution via email to the email address I last provided but may also request a printed copy by calling the office of [facility].    I understand that my insurance will be billed for this visit.   I have read or had this consent read to me. . I understand the contents of this consent, which adequately explains the benefits and risks of the Services being provided via telemedicine.  . I have been provided ample opportunity to ask questions regarding this consent and the Services and have had my questions answered to my satisfaction. . I give my informed consent for the services to be provided through the use of telemedicine in my medical care  By participating in this telemedicine visit I agree to the above.

## 2018-11-20 NOTE — Progress Notes (Signed)
Virtual Visit via Telephone Note   This visit type was conducted due to national recommendations for restrictions regarding the COVID-19 Pandemic (e.g. social distancing) in an effort to limit this patient's exposure and mitigate transmission in our community.  Due to her co-morbid illnesses, this patient is at least at moderate risk for complications without adequate follow up.  This format is felt to be most appropriate for this patient at this time.  The patient did not have access to video technology/had technical difficulties with video requiring transitioning to audio format only (telephone).  All issues noted in this document were discussed and addressed.  No physical exam could be performed with this format.  Please refer to the patient's chart for her  consent to telehealth for [facility].   Date:  11/23/2018   ID:  [name], DOB [birth year], MRN [id number]  Patient Location: Home Provider Location: Office  PCP:  Reynold Bowen, MD  Cardiologist:   Johnsie Cancel Electrophysiologist:  None   Evaluation Performed:  Follow-Up Visit  Chief Complaint:  CAD  History of Present Illness:    68 y.o. anterior wall MI 1998 Rx with BMS to LAD . Edge restenosis Rx re intervention 2008 Quit smoking since then. Echo done 08/16/13 with EF 55-60% mild MR Has had anemia with polypectomy by Dr Henrene Pastor 2017 and again September 2018. BP tends to run higher in office than at home   Carotid duplex < 50% bilateral ICA stenosis 03/31/18   No chest pain compliant with meds BP readings home normal white coat in office   Having neuropathic symptoms in legs and especially arms RUE worse  Started with "pinched" nerve Seen by Dr Krista Blue neurology labs ok only Vit D low Symptoms not helped by Vitamin B shots  MRI no cervical root impingement    The patient  does not have symptoms concerning for COVID-19 infection (fever, chills, cough, or new shortness of breath).    Past Medical History:  Diagnosis Date   . ABDOMINAL AORTIC ANEURYSM   . Anemia   . Arthritis    HANDS  . Blood transfusion without reported diagnosis    AUGUST 5TH 2017 x2   . CAD   . CONGESTIVE HEART FAILURE, SYSTOLIC, CHRONIC   . CT, CHEST, ABNORMAL   . GERD (gastroesophageal reflux disease)   . Heart attack ([place])    x 2  . History of colon polyps 01/05/2014   adenomatous polyps  . HYPERLIPIDEMIA   . HYPERTENSION   . ISCHEMIC CARDIOMYOPATHY   . Lower GI bleed 01/2014  . 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 Numbness and tingling   . 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 Paresthesia of both hands   . TOBACCO ABUSE    Past Surgical History:  Procedure Laterality Date  . bare metal stent placement     left anterior descending artery x 2  . COLONOSCOPY N/A 01/05/2014   Procedure: COLONOSCOPY;  Surgeon: Jerene Bears, MD;  Location: [facility];  Service: Endoscopy;  Laterality: N/A;  . COLONOSCOPY N/A 01/06/2014   Procedure: COLONOSCOPY;  Surgeon: Jerene Bears, MD;  Location: [facility];  Service: Endoscopy;  Laterality: N/A;  . COLONOSCOPY    . POLYPECTOMY       Current Meds  Medication Sig  . carvedilol (COREG) 6.25 MG tablet Take 1 tablet (6.25 mg total) by mouth 2 (two) times daily.  . Cholecalciferol (VITAMIN D3 PO) Take 3,000 Units by mouth  daily.  . clopidogrel (PLAVIX) 75 MG tablet TAKE 1 TABLET BY MOUTH EVERY DAY  . docusate sodium (EQUATE STOOL SOFTENER) 100 MG capsule Take 100 mg by mouth 2 (two) times daily.  . iron polysaccharides (NIFEREX) 150 MG capsule Take 150 mg by mouth daily.  Marland Kitchen losartan-hydrochlorothiazide (HYZAAR) 100-25 MG tablet Take 1 tablet by mouth daily.  . nitroGLYCERIN (NITROSTAT) 0.4 MG SL tablet Place 1 tablet (0.4 mg total) under the tongue every 5 (five) minutes as needed for chest pain.  . pantoprazole (PROTONIX) 40 MG tablet Take 40 mg by mouth daily.  . simvastatin (ZOCOR) 40 MG tablet TAKE 1 TABLET(40 MG) BY MOUTH DAILY AT 6 PM     Allergies:    Patient has no known allergies.   Social History   Tobacco Use  . Smoking status: Former Smoker    Types: Cigarettes, E-cigarettes    Quit date: Sep 25, 2011    Years since quitting: 7.4  . Smokeless tobacco: Never Used  Substance Use Topics  . Alcohol use: Not Currently  . Drug use: No     Family Hx: The patient's family history includes Anemia in her sister; Colon polyps in her father and sister; Depression (age of onset: 66) in her father; Diabetes in her maternal grandmother; Heart attack in her father; Lung cancer in her sister; Lymphoma (age of onset: 35) in her mother. There is no history of Colon cancer, Esophageal cancer, Rectal cancer, or Stomach cancer.  ROS:   Please see the history of present illness.     All other systems reviewed and are negative.   Prior CV studies:   The following studies were reviewed today:  Carotid:  03/31/18  Labs/Other Tests and Data Reviewed:    EKG:  NSR rate 68 normal ECG 08/11/17  Recent Labs: 08/10/2018: ALT 10; BUN 6; Creatinine, Ser 0.84; Hemoglobin 13.0; Potassium 4.2; Sodium 137; TSH 1.420   Recent Lipid Panel Lab Results  Component Value Date/Time   CHOL 161 07/28/2013 12:18 PM   TRIG 78.0 07/28/2013 12:18 PM   HDL 65.30 07/28/2013 12:18 PM   CHOLHDL 2 07/28/2013 12:18 PM   LDLCALC 80 07/28/2013 12:18 PM    Wt Readings from Last 3 Encounters:  11/23/18 60.3 kg  07/13/18 64 kg  05/29/18 66.6 kg     Objective:    Vital Signs:  BP 127/76   Pulse 69   Ht 5' 3.5" (1.613 m)   Wt 60.3 kg   BMI 23.19 kg/m    Telephone visit no exam   ASSESSMENT & PLAN:    CAD: Stable with no angina and good activity level.  Continue medical Rx New nitro called in LAD stent in Sep 24, 1996 with re intervention 2006-09-25 low dose beta blocker due to bradycardia   HTN: Well controlled.  Continue current medications and low sodium Dash type diet.   Home readings normal white coat component   Chol:  On statin labs with [facility]                  Anemia:  On iron improved no bleeding  02/19/16 EGD normal colonoscopy with removal of 8 polyps Dr Henrene Pastor did repeat Colonoscopy 02/13/18 and removed 2 more polyps   Bruit : duplex 03/31/18 < 50% bilateral ICA stenosis repeat 27-Apr-2019  Depression: Sister died 24-Sep-2016  husband with DCM and renal cell cancer PRN valium   Neuropathy:  F/u neurology lab work and MRI's unrevealing to date    COVID-59 Education: The  signs and symptoms of COVID-19 were discussed with the patient and how to seek care for testing (follow up with PCP or arrange E-visit).  The importance of social distancing was discussed today.  Time:   Today, I have spent 30 minutes with the patient with telehealth technology discussing the above problems.     Medication Adjustments/Labs and Tests Ordered: Current medicines are reviewed at length with the patient today.  Concerns regarding medicines are outlined above.   Tests Ordered:  Carotid October f/u  Medication Changes:  None   Disposition:  Follow up in a year  Signed, Jenkins Rouge, MD  11/23/2018 9:23 AM    [place]

## 2018-11-23 ENCOUNTER — Other Ambulatory Visit: Payer: Self-pay

## 2018-11-23 ENCOUNTER — Encounter: Payer: Self-pay | Admitting: Cardiovascular Disease

## 2018-11-23 ENCOUNTER — Telehealth (INDEPENDENT_AMBULATORY_CARE_PROVIDER_SITE_OTHER): Payer: PPO | Admitting: Cardiovascular Disease

## 2018-11-23 VITALS — BP 127/76 | HR 69 | Ht 63.5 in | Wt 133.0 lb

## 2018-11-23 DIAGNOSIS — I779 Disorder of arteries and arterioles, unspecified: Secondary | ICD-10-CM

## 2018-11-23 DIAGNOSIS — I739 Peripheral vascular disease, unspecified: Secondary | ICD-10-CM

## 2018-11-23 NOTE — Patient Instructions (Addendum)
Medication Instructions:   If you need a refill on your cardiac medications before your next appointment, please call your pharmacy.   Lab work:  If you have labs (blood work) drawn today and your tests are completely normal, you will receive your results only by: Marland Kitchen MyChart Message (if you have MyChart) OR . A paper copy in the mail If you have any lab test that is abnormal or we need to change your treatment, we will call you to review the results.  Testing/Procedures: Your physician has requested that you have a carotid duplex. This test is an ultrasound of the carotid arteries in your neck. It looks at blood flow through these arteries that supply the brain with blood. Allow one hour for this exam. There are no restrictions or special instructions.  Follow-Up: At [facility], you and your health needs are our priority.  As part of our continuing mission to provide you with exceptional heart care, we have created designated Provider Care Teams.  These Care Teams include your primary Cardiologist (physician) and Advanced Practice Providers (APPs -  Physician Assistants and Nurse Practitioners) who all work together to provide you with the care you need, when you need it. You will need a follow up appointment in 6 months.  Please call our office 2 months in advance to schedule this appointment.  You may see Jenkins Rouge, MD or one of the following Advanced Practice Providers on your designated Care Team:   Truitt Merle, NP Cecilie Kicks, NP . Kathyrn Drown, NP

## 2019-02-11 ENCOUNTER — Other Ambulatory Visit: Payer: Self-pay | Admitting: Cardiovascular Disease

## 2019-03-04 ENCOUNTER — Other Ambulatory Visit: Payer: Self-pay | Admitting: Cardiovascular Disease

## 2019-03-04 DIAGNOSIS — I6523 Occlusion and stenosis of bilateral carotid arteries: Secondary | ICD-10-CM

## 2019-03-07 ENCOUNTER — Other Ambulatory Visit: Payer: Self-pay | Admitting: Cardiovascular Disease

## 2019-03-08 ENCOUNTER — Telehealth: Payer: Self-pay

## 2019-03-08 ENCOUNTER — Ambulatory Visit (HOSPITAL_COMMUNITY)
Admission: RE | Admit: 2019-03-08 | Discharge: 2019-03-08 | Disposition: A | Discharge status: Home / Self Care | Payer: PPO | Source: Ambulatory Visit | Attending: Cardiovascular Disease | Admitting: Cardiovascular Disease

## 2019-03-08 ENCOUNTER — Other Ambulatory Visit: Payer: Self-pay

## 2019-03-08 DIAGNOSIS — I708 Atherosclerosis of other arteries: Secondary | ICD-10-CM

## 2019-03-08 DIAGNOSIS — I6523 Occlusion and stenosis of bilateral carotid arteries: Secondary | ICD-10-CM | POA: Diagnosis not present

## 2019-03-08 NOTE — Telephone Encounter (Signed)
Called patient's husband (DPR) about consult with Dr. Gwenlyn Found. Will send message to scheduling to help make the appointment.

## 2019-03-08 NOTE — Telephone Encounter (Signed)
-----   Message from Josue Hector, MD sent at 03/08/2019  3:34 PM EDT ----- Can have her see [facility] for consult

## 2019-03-10 DIAGNOSIS — E559 Vitamin D deficiency, unspecified: Secondary | ICD-10-CM | POA: Diagnosis not present

## 2019-03-10 DIAGNOSIS — I779 Disorder of arteries and arterioles, unspecified: Secondary | ICD-10-CM | POA: Diagnosis not present

## 2019-03-10 DIAGNOSIS — Z1331 Encounter for screening for depression: Secondary | ICD-10-CM | POA: Diagnosis not present

## 2019-03-10 DIAGNOSIS — D126 Benign neoplasm of colon, unspecified: Secondary | ICD-10-CM | POA: Diagnosis not present

## 2019-03-10 DIAGNOSIS — E785 Hyperlipidemia, unspecified: Secondary | ICD-10-CM | POA: Diagnosis not present

## 2019-03-10 DIAGNOSIS — G119 Hereditary ataxia, unspecified: Secondary | ICD-10-CM | POA: Diagnosis not present

## 2019-03-10 DIAGNOSIS — I2510 Atherosclerotic heart disease of native coronary artery without angina pectoris: Secondary | ICD-10-CM | POA: Diagnosis not present

## 2019-03-10 DIAGNOSIS — D510 Vitamin B12 deficiency anemia due to intrinsic factor deficiency: Secondary | ICD-10-CM | POA: Diagnosis not present

## 2019-03-10 DIAGNOSIS — I700 Atherosclerosis of aorta: Secondary | ICD-10-CM | POA: Diagnosis not present

## 2019-03-10 DIAGNOSIS — I771 Stricture of artery: Secondary | ICD-10-CM | POA: Diagnosis not present

## 2019-03-11 DIAGNOSIS — Z23 Encounter for immunization: Secondary | ICD-10-CM | POA: Diagnosis not present

## 2019-03-12 ENCOUNTER — Other Ambulatory Visit: Payer: Self-pay

## 2019-03-12 ENCOUNTER — Ambulatory Visit: Payer: PPO | Admitting: Cardiovascular Disease

## 2019-03-12 ENCOUNTER — Encounter: Payer: Self-pay | Admitting: Cardiovascular Disease

## 2019-03-12 VITALS — BP 128/66 | HR 63 | Temp 96.8°F | Ht 63.0 in | Wt 127.0 lb

## 2019-03-12 DIAGNOSIS — Z008 Encounter for other general examination: Secondary | ICD-10-CM | POA: Diagnosis not present

## 2019-03-12 DIAGNOSIS — I771 Stricture of artery: Secondary | ICD-10-CM | POA: Diagnosis not present

## 2019-03-12 DIAGNOSIS — I739 Peripheral vascular disease, unspecified: Secondary | ICD-10-CM | POA: Diagnosis not present

## 2019-03-12 NOTE — Patient Instructions (Addendum)
Medication Instructions:  Your physician recommends that you continue on your current medications as directed. Please refer to the Current Medication list given to you today.  If you need a refill on your cardiac medications before your next appointment, please call your pharmacy.   Lab work: none If you have labs (blood work) drawn today and your tests are completely normal, you will receive your results only by: Marland Kitchen MyChart Message (if you have MyChart) OR . A paper copy in the mail If you have any lab test that is abnormal or we need to change your treatment, we will call you to review the results.  Testing/Procedures: **section updated after check-out** Your physician has requested that you have a lower or upper extremity arterial duplex. This test is an ultrasound of the arteries in the legs or arms. It looks at arterial blood flow in the legs and arms. Allow one hour for Lower and Upper Arterial scans. There are no restrictions or special instructions TO BE SCHEDULED FOR THE NEXT AVAILABLE APPOINTMENT  Your physician has requested that you have an ankle brachial index (ABI). During this test an ultrasound and blood pressure cuff are used to evaluate the arteries that supply the arms and legs with blood. Allow thirty minutes for this exam. There are no restrictions or special instructions. TO BE SCHEDULED FOR THE NEXT AVAILABLE APPOINTMENT   Follow-Up: At [facility], you and your health needs are our priority.  As part of our continuing mission to provide you with exceptional heart care, we have created designated Provider Care Teams.  These Care Teams include your primary Cardiologist (physician) and Advanced Practice Providers (APPs -  Physician Assistants and Nurse Practitioners) who all work together to provide you with the care you need, when you need it. You will need a follow up appointment in 3 months with Dr. Quay Burow.  Please call our office 2 months in advance to  schedule this appointment.

## 2019-03-12 NOTE — Progress Notes (Signed)
Verbal order request from Dr. Gwenlyn Found to also have pt get lower extremity ultrasounds. Pt has already left the office. Updated AVS mailed to pt

## 2019-03-12 NOTE — Assessment & Plan Note (Signed)
[name] was referred to me by Dr. Johnsie Cancel for evaluation of potential symptomatic left subclavian artery stenosis.  She had carotid Dopplers performed 03/08/2019 revealing no evidence of ICA stenosis but there was moderate left subclavian artery stenosis.  There is antegrade vertebral flow bilaterally.  There was a 9 mm pressure differential between the right and left arms.  She has what sounds like a peripheral neuropathy involving her arms and legs but I cannot elicit symptoms of right left upper extremity claudication.  She gets dizzy several times a week over last several months but it is not clear to me that this is related to subclavian steal.  At this juncture, I think I am going to follow her clinically and will see her back in 3 months.  She may ultimately require subclavian intervention.

## 2019-03-12 NOTE — Progress Notes (Signed)
03/12/2019 [name]   1950-11-05  EX:[id number]  Primary Physician Reynold Bowen, MD Primary Cardiologist: Lorretta Harp MD Garret Reddish, [place], [place]  HPI:  [name] is a 68 y.o. thin appearing married Caucasian female with no children is accompanied by her husband Clair Gulling today.  She was referred by Dr. Johnsie Cancel, her cardiologist for evaluation of left subclavian artery stenosis and symptoms related to this.  Her risk factors include treated hypertension and hyperlipidemia.  She stopped smoking in 2008 after coronary intervention.  She is never had a heart attack or stroke.  She denies chest pain or shortness of breath.  There is no family history for heart disease.  She had anterior MI in 1998 and had bare-metal stenting of her LAD and had a re-intervention 2008.  She has normal LV function.  Recent carotid Dopplers performed 03/08/2019 revealed no evidence of ICA stenosis although there was moderate left subclavian artery stenosis with antegrade vertebral flow bilaterally and only a 9 mm blood pressure gradient across the upper extremities.  She is being worked up for peripheral neuropathy at [facility].  I cannot elicit symptoms of left upper extremity claudication.  She does get dizzy on a regular basis over the last 3 to 4 months lasting up to 30 seconds at a time for unclear reasons.  I am not convinced that this is related to subclavian steal at this time.   Current Meds  Medication Sig  . carvedilol (COREG) 6.25 MG tablet TAKE 1 TABLET(6.25 MG) BY MOUTH TWICE DAILY  . clopidogrel (PLAVIX) 75 MG tablet TAKE 1 TABLET BY MOUTH EVERY DAY  . docusate sodium (EQUATE STOOL SOFTENER) 100 MG capsule Take 100 mg by mouth 2 (two) times daily.  . iron polysaccharides (NIFEREX) 150 MG capsule Take 150 mg by mouth daily.  Marland Kitchen losartan-hydrochlorothiazide (HYZAAR) 100-25 MG tablet Take 1 tablet by mouth daily.  . nitroGLYCERIN (NITROSTAT) 0.4 MG SL tablet Place 1 tablet (0.4 mg total)  under the tongue every 5 (five) minutes as needed for chest pain.  . pantoprazole (PROTONIX) 40 MG tablet Take 40 mg by mouth daily.  . simvastatin (ZOCOR) 40 MG tablet TAKE 1 TABLET(40 MG) BY MOUTH DAILY AT 6 PM  . VITAMIN D, CHOLECALCIFEROL, PO Take 1 tablet by mouth 2 (two) times a week.  . [DISCONTINUED] Cholecalciferol (VITAMIN D3 PO) Take 3,000 Units by mouth daily.     No Known Allergies  Social History   Socioeconomic History  . Marital status: Married    Spouse name: Clair Gulling  . Number of children: 0  . Years of education: 72  . Highest education level: Not on file  Occupational History  . Occupation: retired  Scientific laboratory technician  . Financial resource strain: Not on file  . Food insecurity    Worry: Not on file    Inability: Not on file  . Transportation needs    Medical: Not on file    Non-medical: Not on file  Tobacco Use  . Smoking status: Former Smoker    Types: Cigarettes, E-cigarettes    Quit date: 2013    Years since quitting: 7.7  . Smokeless tobacco: Never Used  Substance and Sexual Activity  . Alcohol use: Not Currently  . Drug use: No  . Sexual activity: Not on file  Lifestyle  . Physical activity    Days per week: Not on file    Minutes per session: Not on file  . Stress: Not on file  Relationships  . Social Herbalist on phone: Not on file    Gets together: Not on file    Attends religious service: Not on file    Active member of club or organization: Not on file    Attends meetings of clubs or organizations: Not on file    Relationship status: Not on file  . Intimate partner violence    Fear of current or ex partner: Not on file    Emotionally abused: Not on file    Physically abused: Not on file    Forced sexual activity: Not on file  Other Topics Concern  . Not on file  Social History Narrative   Right-handed.   Three cups caffeine daily.   Lives at home with her husband.     Review of Systems: General: negative for chills, fever,  night sweats or weight changes.  Cardiovascular: negative for chest pain, dyspnea on exertion, edema, orthopnea, palpitations, paroxysmal nocturnal dyspnea or shortness of breath Dermatological: negative for rash Respiratory: negative for cough or wheezing Urologic: negative for hematuria Abdominal: negative for nausea, vomiting, diarrhea, bright red blood per rectum, melena, or hematemesis Neurologic: negative for visual changes, syncope, or dizziness All other systems reviewed and are otherwise negative except as noted above.    Blood pressure 128/66, pulse 63, temperature (!) 96.8 F (36 C), height 5\' 3"  (1.6 m), weight 127 lb (57.6 kg).  General appearance: alert and no distress Neck: no adenopathy, no JVD, supple, symmetrical, trachea midline, thyroid not enlarged, symmetric, no tenderness/mass/nodules and Left subclavian bruit Lungs: clear to auscultation bilaterally Heart: regular rate and rhythm, S1, S2 normal, no murmur, click, rub or gallop Extremities: extremities normal, atraumatic, no cyanosis or edema Pulses: 2+ and symmetric Skin: Skin color, texture, turgor normal. No rashes or lesions Neurologic: Alert and oriented X 3, normal strength and tone. Normal symmetric reflexes. Normal coordination and gait  EKG sinus rhythm at 63 with right ventricular conduction delay.  I personally reviewed this EKG.  ASSESSMENT AND PLAN:   Stenosis of left subclavian artery ([place]) [name] was referred to me by Dr. Johnsie Cancel for evaluation of potential symptomatic left subclavian artery stenosis.  She had carotid Dopplers performed 03/08/2019 revealing no evidence of ICA stenosis but there was moderate left subclavian artery stenosis.  There is antegrade vertebral flow bilaterally.  There was a 9 mm pressure differential between the right and left arms.  She has what sounds like a peripheral neuropathy involving her arms and legs but I cannot elicit symptoms of right left upper extremity  claudication.  She gets dizzy several times a week over last several months but it is not clear to me that this is related to subclavian steal.  At this juncture, I think I am going to follow her clinically and will see her back in 3 months.  She may ultimately require subclavian intervention.      Lorretta Harp MD FACP,FACC,FAHA, [facility] 03/12/2019 1:45 PM

## 2019-03-12 NOTE — Addendum Note (Signed)
Addended by: Annita Brod on: 03/12/2019 01:52 PM   Modules accepted: Orders

## 2019-03-26 ENCOUNTER — Ambulatory Visit (HOSPITAL_COMMUNITY)
Admission: RE | Admit: 2019-03-26 | Discharge: 2019-03-26 | Disposition: A | Discharge status: Home / Self Care | Payer: PPO | Source: Ambulatory Visit | Attending: Cardiovascular Disease | Admitting: Cardiovascular Disease

## 2019-03-26 ENCOUNTER — Other Ambulatory Visit: Payer: Self-pay

## 2019-03-26 DIAGNOSIS — I739 Peripheral vascular disease, unspecified: Secondary | ICD-10-CM | POA: Insufficient documentation

## 2019-04-23 ENCOUNTER — Telehealth: Payer: Self-pay | Admitting: Cardiovascular Disease

## 2019-04-23 NOTE — Telephone Encounter (Signed)
Patient's husband want's to come with his wife to her appt with Dr. Gwenlyn Found on 04/26/19. I stated at this time we are asking that the patient come alone unless there is a medical need. Patient's husband states that his wife has a hard time retaining information and he would need to be there to help her understand and remember the consultation.

## 2019-04-23 NOTE — Telephone Encounter (Signed)
Spoke with pt husband. Informed that it is okay for him to present for appt 11/23 with pt d/t stating pt has difficulties retaining info and would have trouble understanding and remembering info discussed during appt

## 2019-04-26 ENCOUNTER — Ambulatory Visit: Payer: PPO | Admitting: Cardiovascular Disease

## 2019-04-26 ENCOUNTER — Encounter: Payer: Self-pay | Admitting: Cardiovascular Disease

## 2019-04-26 ENCOUNTER — Other Ambulatory Visit: Payer: Self-pay

## 2019-04-26 VITALS — BP 160/96 | HR 78 | Ht 63.0 in | Wt 124.6 lb

## 2019-04-26 DIAGNOSIS — I739 Peripheral vascular disease, unspecified: Secondary | ICD-10-CM | POA: Insufficient documentation

## 2019-04-26 DIAGNOSIS — I771 Stricture of artery: Secondary | ICD-10-CM

## 2019-04-26 DIAGNOSIS — I6523 Occlusion and stenosis of bilateral carotid arteries: Secondary | ICD-10-CM

## 2019-04-26 DIAGNOSIS — I2510 Atherosclerotic heart disease of native coronary artery without angina pectoris: Secondary | ICD-10-CM

## 2019-04-26 DIAGNOSIS — I2583 Coronary atherosclerosis due to lipid rich plaque: Secondary | ICD-10-CM | POA: Diagnosis not present

## 2019-04-26 NOTE — Assessment & Plan Note (Signed)
[name] had lower extremity arterial Doppler studies performed in our office on 03/26/2019 revealing a right ABI of 1.0 and a left ABI of 0.87.  She had mild disease in her distal right common femoral artery and more severe disease in the left iliac artery.  She does complain of some right calf claudication however not supported by her Doppler procedure.  We will continue to follow her noninvasively

## 2019-04-26 NOTE — Progress Notes (Signed)
04/26/2019 [name]   [birth year]  [id number]  Primary Physician Reynold Bowen, MD Primary Cardiologist: Lorretta Harp MD Garret Reddish, [place], [place]  HPI:  [name] is a 68 y.o.  thin appearing married Caucasian female with no children is accompanied by her husband Clair Gulling today.  She was referred by Dr. Johnsie Cancel, her cardiologist for evaluation of left subclavian artery stenosis and symptoms related to this.    I last saw her in the office 03/12/2019.  Her risk factors include treated hypertension and hyperlipidemia.  She stopped smoking in 2008 after coronary intervention.  She is never had a heart attack or stroke.  She denies chest pain or shortness of breath.  There is no family history for heart disease.  She had anterior MI in 1998 and had bare-metal stenting of her LAD and had a re-intervention 2008.  She has normal LV function.  Recent carotid Dopplers performed 03/08/2019 revealed no evidence of ICA stenosis although there was moderate left subclavian artery stenosis with antegrade vertebral flow bilaterally and only a 9 mm blood pressure gradient across the upper extremities.  She is being worked up for peripheral neuropathy at [facility].  I cannot elicit symptoms of left upper extremity claudication.  She does get dizzy on a regular basis over the last 3 to 4 months lasting up to 30 seconds at a time for unclear reasons.  I am not convinced that this is related to subclavian steal at this time.  I did perform lower extremity arterial Doppler studies on her 03/26/2019 revealing a right ABI of 1.0 and a left of 0.87 with moderate disease in her left iliac system although her complaints are of occasional right calf claudication.    Current Meds  Medication Sig  . carvedilol (COREG) 6.25 MG tablet TAKE 1 TABLET(6.25 MG) BY MOUTH TWICE DAILY  . clopidogrel (PLAVIX) 75 MG tablet TAKE 1 TABLET BY MOUTH EVERY DAY  . docusate sodium (EQUATE STOOL SOFTENER) 100 MG capsule  Take 100 mg by mouth 2 (two) times daily.  . iron polysaccharides (NIFEREX) 150 MG capsule Take 150 mg by mouth daily.  Marland Kitchen losartan-hydrochlorothiazide (HYZAAR) 100-25 MG tablet Take 1 tablet by mouth daily.  . nitroGLYCERIN (NITROSTAT) 0.4 MG SL tablet Place 1 tablet (0.4 mg total) under the tongue every 5 (five) minutes as needed for chest pain.  . pantoprazole (PROTONIX) 40 MG tablet Take 40 mg by mouth daily.  . simvastatin (ZOCOR) 40 MG tablet TAKE 1 TABLET(40 MG) BY MOUTH DAILY AT 6 PM  . VITAMIN D, CHOLECALCIFEROL, PO Take 1 tablet by mouth 2 (two) times a week.     No Known Allergies  Social History   Socioeconomic History  . Marital status: Married    Spouse name: Clair Gulling  . Number of children: 0  . Years of education: 62  . Highest education level: Not on file  Occupational History  . Occupation: retired  Scientific laboratory technician  . Financial resource strain: Not on file  . Food insecurity    Worry: Not on file    Inability: Not on file  . Transportation needs    Medical: Not on file    Non-medical: Not on file  Tobacco Use  . Smoking status: Former Smoker    Types: Cigarettes, E-cigarettes    Quit date: 2013    Years since quitting: 7.8  . Smokeless tobacco: Never Used  Substance and Sexual Activity  . Alcohol use: Not Currently  .  Drug use: No  . Sexual activity: Not on file  Lifestyle  . Physical activity    Days per week: Not on file    Minutes per session: Not on file  . Stress: Not on file  Relationships  . Social Herbalist on phone: Not on file    Gets together: Not on file    Attends religious service: Not on file    Active member of club or organization: Not on file    Attends meetings of clubs or organizations: Not on file    Relationship status: Not on file  . Intimate partner violence    Fear of current or ex partner: Not on file    Emotionally abused: Not on file    Physically abused: Not on file    Forced sexual activity: Not on file  Other  Topics Concern  . Not on file  Social History Narrative   Right-handed.   Three cups caffeine daily.   Lives at home with her husband.     Review of Systems: General: negative for chills, fever, night sweats or weight changes.  Cardiovascular: negative for chest pain, dyspnea on exertion, edema, orthopnea, palpitations, paroxysmal nocturnal dyspnea or shortness of breath Dermatological: negative for rash Respiratory: negative for cough or wheezing Urologic: negative for hematuria Abdominal: negative for nausea, vomiting, diarrhea, bright red blood per rectum, melena, or hematemesis Neurologic: negative for visual changes, syncope, or dizziness All other systems reviewed and are otherwise negative except as noted above.    Blood pressure (!) 160/96, pulse 78, height 5\' 3"  (1.6 m), weight 124 lb 9.6 oz (56.5 kg), SpO2 97 %.  General appearance: alert and no distress Neck: no adenopathy, no JVD, supple, symmetrical, trachea midline, thyroid not enlarged, symmetric, no tenderness/mass/nodules and Left carotid and subclavian bruit Lungs: clear to auscultation bilaterally Heart: normal apical impulse Extremities: extremities normal, atraumatic, no cyanosis or edema Pulses: 2+ and symmetric Skin: Skin color, texture, turgor normal. No rashes or lesions Neurologic: Alert and oriented X 3, normal strength and tone. Normal symmetric reflexes. Normal coordination and gait  EKG not performed today  ASSESSMENT AND PLAN:   Peripheral arterial disease ([place]) Ms. Berganza had lower extremity arterial Doppler studies performed in our office on 03/26/2019 revealing a right ABI of 1.0 and a left ABI of 0.87.  She had mild disease in her distal right common femoral artery and more severe disease in the left iliac artery.  She does complain of some right calf claudication however not supported by her Doppler procedure.  We will continue to follow her noninvasively  Stenosis of left subclavian artery  (HCC) History of left subclavian artery stenosis with a 10 mm blood pressure differential demonstrated by duplex ultrasound 03/08/2019 across her upper extremities.  She did have antegrade flow on the left.  She denies left upper extremity claudication but does get occasional dizziness.  Is unclear to me whether this is subclavian steal.  We will continue to follow her noninvasively      Lorretta Harp MD [facility], [facility] 04/26/2019 9:50 AM

## 2019-04-26 NOTE — Patient Instructions (Signed)
Medication Instructions:  Your physician recommends that you continue on your current medications as directed. Please refer to the Current Medication list given to you today.  If you need a refill on your cardiac medications before your next appointment, please call your pharmacy.   Lab work: NONE  Testing/Procedures: Your physician has requested that you have a carotid duplex in 6 months. This test is an ultrasound of the carotid arteries in your neck. It looks at blood flow through these arteries that supply the brain with blood. Allow one hour for this exam. There are no restrictions or special instructions.  Your physician has requested that you have a lower extremity arterial exercise duplex with ABI's in 6 months.. During this test, exercise and ultrasound are used to evaluate arterial blood flow in the legs. Allow one hour for this exam. There are no restrictions or special instructions.   Follow-Up: At [facility], you and your health needs are our priority.  As part of our continuing mission to provide you with exceptional heart care, we have created designated Provider Care Teams.  These Care Teams include your primary Cardiologist (physician) and Advanced Practice Providers (APPs -  Physician Assistants and Nurse Practitioners) who all work together to provide you with the care you need, when you need it. You may see Dr Gwenlyn Found or one of the following Advanced Practice Providers on your designated Care Team:    Kerin Ransom, PA-C  [place], [place]  Coletta Memos, [place]  Your physician wants you to follow-up in: 6 months after dopplers. You will receive a reminder letter in the mail two months in advance. If you don't receive a letter, please call our office to schedule the follow-up appointment.

## 2019-04-26 NOTE — Assessment & Plan Note (Signed)
History of left subclavian artery stenosis with a 10 mm blood pressure differential demonstrated by duplex ultrasound 03/08/2019 across her upper extremities.  She did have antegrade flow on the left.  She denies left upper extremity claudication but does get occasional dizziness.  Is unclear to me whether this is subclavian steal.  We will continue to follow her noninvasively

## 2019-05-12 ENCOUNTER — Telehealth: Payer: Self-pay | Admitting: Neurology

## 2019-05-12 NOTE — Telephone Encounter (Signed)
Tisha @ [facility](Dr Remo Lipps [place]'s office at [phone]) has called on behalf of pt [id number] issues, numbness in hands and feet, pt unable to hold anything.  Judie Petit states pt was at home and could not confirm if there available appointment date that Dr Krista Blue has would work for pt.  Judie Petit said she will call pt and have pt or her husband to call [facility] for an appointment to be scheduled.  This is Pharmacist, hospital

## 2019-06-20 NOTE — Progress Notes (Signed)
Date:  06/29/2019   ID:  ZEKIA GRAHEK, DOB [birth year], MRN [id number]  PCP:  Reynold Bowen, MD  Cardiologist:   Johnsie Cancel Electrophysiologist:  None   Evaluation Performed:  Follow-Up Visit  Chief Complaint:  CAD/PVD   History of Present Illness:    69 y.o. . anterior wall MI 1998 Rx with BMS to LAD . Edge restenosis Rx re intervention 2008 Quit smoking since then. Echo done 08/16/13 with EF 55-60% mild MR Has had anemia with polypectomy by Dr Henrene Pastor 2017 and again September 2018. BP tends to run higher in office than at home    No chest pain compliant with meds BP readings home normal white coat in office   Having neuropathic symptoms in legs and especially arms RUE worse  Started with "pinched" nerve Seen by Dr Krista Blue neurology labs ok only Vit D low Symptoms not helped by Vitamin B shots  MRI no cervical root impingement   Seen by Dr Gwenlyn Found 04/26/19 with moderate PVD ABI normal on right and 0.87 on left Also with left subclavian stenosis ant atypical left vertebral flow Peak velocity in left subclavian 3.78 m/sec.  Both being followed with no intervention  No angina   The patient  does not have symptoms concerning for COVID-19 infection (fever, chills, cough, or new shortness of breath).    Past Medical History:  Diagnosis Date  . ABDOMINAL AORTIC ANEURYSM   . Anemia   . Arthritis    HANDS  . Blood transfusion without reported diagnosis    AUGUST 5TH 2017 x2   . CAD   . CONGESTIVE HEART FAILURE, SYSTOLIC, CHRONIC   . CT, CHEST, ABNORMAL   . GERD (gastroesophageal reflux disease)   . Heart attack ([place])    x 2  . History of colon polyps 01/05/2014   adenomatous polyps  . HYPERLIPIDEMIA   . HYPERTENSION   . ISCHEMIC CARDIOMYOPATHY   . Lower GI bleed 01/2014  . 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 Numbness and tingling   . 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 Paresthesia of both hands   . TOBACCO ABUSE    Past Surgical History:    Procedure Laterality Date  . bare metal stent placement     left anterior descending artery x 2  . COLONOSCOPY N/A 01/05/2014   Procedure: COLONOSCOPY;  Surgeon: Jerene Bears, MD;  Location: [facility];  Service: Endoscopy;  Laterality: N/A;  . COLONOSCOPY N/A 01/06/2014   Procedure: COLONOSCOPY;  Surgeon: Jerene Bears, MD;  Location: [facility];  Service: Endoscopy;  Laterality: N/A;  . COLONOSCOPY    . POLYPECTOMY       Current Meds  Medication Sig  . carvedilol (COREG) 6.25 MG tablet TAKE 1 TABLET(6.25 MG) BY MOUTH TWICE DAILY  . clopidogrel (PLAVIX) 75 MG tablet TAKE 1 TABLET BY MOUTH EVERY DAY  . docusate sodium (EQUATE STOOL SOFTENER) 100 MG capsule Take 100 mg by mouth 2 (two) times daily.  . iron polysaccharides (NIFEREX) 150 MG capsule Take 150 mg by mouth daily.  Marland Kitchen losartan-hydrochlorothiazide (HYZAAR) 100-25 MG tablet Take 1 tablet by mouth daily.  . nitroGLYCERIN (NITROSTAT) 0.4 MG SL tablet Place 1 tablet (0.4 mg total) under the tongue every 5 (five) minutes as needed for chest pain.  . pantoprazole (PROTONIX) 40 MG tablet Take 40 mg by mouth daily.  . simvastatin (ZOCOR) 40 MG tablet TAKE 1 TABLET(40 MG) BY MOUTH DAILY AT 6 PM  .  VITAMIN D, CHOLECALCIFEROL, PO Take 1 tablet by mouth 2 (two) times a week.     Allergies:   Patient has no known allergies.   Social History   Tobacco Use  . Smoking status: Former Smoker    Types: Cigarettes, E-cigarettes    Quit date: 30-Aug-2011    Years since quitting: 8.0  . Smokeless tobacco: Never Used  Substance Use Topics  . Alcohol use: Not Currently  . Drug use: No     Family Hx: The patient's family history includes Anemia in her sister; Colon polyps in her father and sister; Depression (age of onset: 31) in her father; Diabetes in her maternal grandmother; Heart attack in her father; Lung cancer in her sister; Lymphoma (age of onset: 92) in her mother. There is no history of Colon cancer, Esophageal cancer, Rectal cancer, or  Stomach cancer.  ROS:   Please see the history of present illness.     All other systems reviewed and are negative.   Prior CV studies:   The following studies were reviewed today:  Carotid:  03/08/19 LE ABI 03/26/19   Labs/Other Tests and Data Reviewed:    EKG:  NSR rate 68 normal ECG 08/11/17  Recent Labs: 08/10/2018: ALT 10; BUN 6; Creatinine, Ser 0.84; Hemoglobin 13.0; Potassium 4.2; Sodium 137; TSH 1.420   Recent Lipid Panel Lab Results  Component Value Date/Time   CHOL 161 07/28/2013 12:18 PM   TRIG 78.0 07/28/2013 12:18 PM   HDL 65.30 07/28/2013 12:18 PM   CHOLHDL 2 07/28/2013 12:18 PM   LDLCALC 80 07/28/2013 12:18 PM    Wt Readings from Last 3 Encounters:  06/29/19 119 lb 12.8 oz (54.3 kg)  04/26/19 124 lb 9.6 oz (56.5 kg)  03/12/19 127 lb (57.6 kg)     Objective:    Vital Signs:  BP (!) 150/88   Pulse 79   Ht 5\' 3"  (1.6 m)   Wt 119 lb 12.8 oz (54.3 kg)   SpO2 99%   BMI 21.22 kg/m    Affect appropriate Chronically ill white female  HEENT: normal Neck supple with no adenopathy JVP normal right bruits no thyromegaly Lungs clear with no wheezing and good diaphragmatic motion Heart:  S1/S2 no murmur, no rub, gallop or click PMI normal Abdomen: benighn, BS positve, no tenderness, no AAA no bruit.  No HSM or HJR Distal pulses intact with no bruits No edema Neuro non-focal Skin warm and dry No muscular weakness   ASSESSMENT & PLAN:    CAD: Stable with no angina and good activity level.  Continue medical Rx New nitro called in LAD stent in August 29, 1996 with re intervention 2006-08-30 low dose beta blocker due to bradycardia   HTN: Well controlled.  Continue current medications and low sodium Dash type diet.   Home readings normal white coat component   HLD:  On statin labs with primary   Anemia:  On iron improved no bleeding  02/19/16 EGD normal colonoscopy with removal of 8 polyps Dr Henrene Pastor did repeat Colonoscopy 02/13/18 and removed 2 more polyps   PVD: f/u  Dr Gwenlyn Found moderate LLE dx and left subclavian stenosis without arm weakness   Depression: Sister died 08-29-2016  husband with DCM and renal cell cancer PRN valium   Neuropathy:  F/u neurology lab work and MRI's unrevealing to date    COVID-19 Education: The signs and symptoms of COVID-19 were discussed with the patient and how to seek care for testing (follow up with PCP or arrange  E-visit).  The importance of social distancing was discussed today.   Medication Adjustments/Labs and Tests Ordered: Current medicines are reviewed at length with the patient today.  Concerns regarding medicines are outlined above.   Tests Ordered:  Carotid and LE ABI's 03/2020   Medication Changes:  None   Disposition:  Follow up with Dr Gwenlyn Found in 6 months and me in a year   Signed, Jenkins Rouge, MD  06/29/2019 9:27 AM    [place]

## 2019-06-28 ENCOUNTER — Other Ambulatory Visit: Payer: Self-pay

## 2019-06-28 MED ORDER — SIMVASTATIN 40 MG PO TABS: ORAL_TABLET | ORAL | 0 refills | Status: DC

## 2019-06-29 ENCOUNTER — Encounter: Payer: Self-pay | Admitting: Cardiovascular Disease

## 2019-06-29 ENCOUNTER — Ambulatory Visit: Payer: PPO | Admitting: Cardiovascular Disease

## 2019-06-29 ENCOUNTER — Other Ambulatory Visit: Payer: Self-pay

## 2019-06-29 VITALS — BP 150/88 | HR 79 | Ht 63.0 in | Wt 119.8 lb

## 2019-06-29 DIAGNOSIS — I2510 Atherosclerotic heart disease of native coronary artery without angina pectoris: Secondary | ICD-10-CM | POA: Diagnosis not present

## 2019-06-29 DIAGNOSIS — I739 Peripheral vascular disease, unspecified: Secondary | ICD-10-CM | POA: Diagnosis not present

## 2019-06-29 NOTE — Patient Instructions (Signed)

## 2019-07-07 DIAGNOSIS — K7689 Other specified diseases of liver: Secondary | ICD-10-CM | POA: Diagnosis not present

## 2019-07-07 DIAGNOSIS — G609 Hereditary and idiopathic neuropathy, unspecified: Secondary | ICD-10-CM | POA: Diagnosis not present

## 2019-07-07 DIAGNOSIS — G119 Hereditary ataxia, unspecified: Secondary | ICD-10-CM | POA: Diagnosis not present

## 2019-07-07 DIAGNOSIS — D510 Vitamin B12 deficiency anemia due to intrinsic factor deficiency: Secondary | ICD-10-CM | POA: Diagnosis not present

## 2019-07-07 DIAGNOSIS — D126 Benign neoplasm of colon, unspecified: Secondary | ICD-10-CM | POA: Diagnosis not present

## 2019-07-07 DIAGNOSIS — I10 Essential (primary) hypertension: Secondary | ICD-10-CM | POA: Diagnosis not present

## 2019-07-07 DIAGNOSIS — I2510 Atherosclerotic heart disease of native coronary artery without angina pectoris: Secondary | ICD-10-CM | POA: Diagnosis not present

## 2019-07-07 DIAGNOSIS — I779 Disorder of arteries and arterioles, unspecified: Secondary | ICD-10-CM | POA: Diagnosis not present

## 2019-07-07 DIAGNOSIS — I771 Stricture of artery: Secondary | ICD-10-CM | POA: Diagnosis not present

## 2019-07-07 DIAGNOSIS — I739 Peripheral vascular disease, unspecified: Secondary | ICD-10-CM | POA: Diagnosis not present

## 2019-07-07 DIAGNOSIS — M5416 Radiculopathy, lumbar region: Secondary | ICD-10-CM | POA: Diagnosis not present

## 2019-07-07 DIAGNOSIS — E785 Hyperlipidemia, unspecified: Secondary | ICD-10-CM | POA: Diagnosis not present

## 2019-07-08 DIAGNOSIS — D510 Vitamin B12 deficiency anemia due to intrinsic factor deficiency: Secondary | ICD-10-CM | POA: Diagnosis not present

## 2019-07-08 DIAGNOSIS — K7689 Other specified diseases of liver: Secondary | ICD-10-CM | POA: Diagnosis not present

## 2019-07-08 DIAGNOSIS — I10 Essential (primary) hypertension: Secondary | ICD-10-CM | POA: Diagnosis not present

## 2019-07-08 DIAGNOSIS — E559 Vitamin D deficiency, unspecified: Secondary | ICD-10-CM | POA: Diagnosis not present

## 2019-07-08 DIAGNOSIS — E7849 Other hyperlipidemia: Secondary | ICD-10-CM | POA: Diagnosis not present

## 2019-07-12 ENCOUNTER — Ambulatory Visit (INDEPENDENT_AMBULATORY_CARE_PROVIDER_SITE_OTHER): Payer: PPO | Admitting: Neurology

## 2019-07-12 ENCOUNTER — Other Ambulatory Visit: Payer: Self-pay

## 2019-07-12 ENCOUNTER — Encounter: Payer: Self-pay | Admitting: Neurology

## 2019-07-12 VITALS — BP 150/78 | HR 72 | Ht 63.0 in | Wt 118.8 lb

## 2019-07-12 DIAGNOSIS — G629 Polyneuropathy, unspecified: Secondary | ICD-10-CM | POA: Diagnosis not present

## 2019-07-12 DIAGNOSIS — R202 Paresthesia of skin: Secondary | ICD-10-CM

## 2019-07-12 DIAGNOSIS — G3281 Cerebellar ataxia in diseases classified elsewhere: Secondary | ICD-10-CM

## 2019-07-12 DIAGNOSIS — R269 Unspecified abnormalities of gait and mobility: Secondary | ICD-10-CM

## 2019-07-12 NOTE — Progress Notes (Signed)
[facility] Clinic Note - Initial Visit   Date: 07/12/19  [name] MRN: [id number] DOB: [birth year]   Dear Dr. Forde Dandy:  Thank you for your kind referral of [name] for consultation of neuropathy. Although her history is well known to you, please allow [place] to reiterate it for the purpose of our medical record. The patient was accompanied to the clinic by self.    History of Present Illness: [name] is a 69 y.o. right-handed female with coronary artery disease, hypertension, hyperlipidemia, subclavian stenosis, pernicious anemia, and aortic atherosclerosis presenting for evaluation of sensory neuropathy.  Starting in June 2019, she began having numbness over the right hand and arm which then progressed into both arms and legs over the next few weeks. She has sensation over the chest and face.  She has some numbness in her groin.  Symptoms are constant and worse on the right side. She has generalized weakness, reports dropping objects.  She has imbalance and has fallen three times in December.  She walks unassisted and has not done physical therapy.    Prior work-up by Dr. Krista Blue has been extensive and including imaging of the cervical, thoracic, and lumbar spine which shows mild degenerative changes only.  NCS/EMG showed absent sensory responses diffusely.  Serology testing for autoimmune, infectious, metabolic, nutritional deficiency, and paraneoplastic disease was negative.   Out-side paper records, electronic medical record, and images have been reviewed where available and summarized as:  Labs 08/10/2018:  Paraneoplastic antibody neg, vitamin B5 24, Lyme neg, vitamin B12 341, RPR neg, HIV neg, B1 105,ACE neg, ANA neg, hepatitis panel neg, copper 127, ESR 31, CK 129  NCS/EMG of the right arm and leg 08/10/2018: This is an abnormal study.  There is electrodiagnostic evidence of absent sensory responses on all the sensory nerves tested, suggestive of  sensory neuronopathy,  MRI lumbar spine wo contrast 08/16/2018:  Mild abnormal MRI scan lumbar spine showing mild facet and disc degenerative changes resulting in mild foraminal narrowing left greater than right L3-4 and L4-5 and L5-S1 but without definite compression.  MRI thoracic spine wo contrast 08/16/2018: Unremarkable MRI scan of thoracic spine showing only minor disc signal abnormalities. No frank disc herniation, compression or foraminal encroachment is noted  MRI cervical spine wo contrast 07/26/2018: This MRI of the cervical spine without contrast shows the following: 1.    The spinal cord appears normal. 2.    Mild multilevel degenerative changes as detailed above that do not lead to spinal stenosis or nerve root compression. 3.    6 mm cyst in the right hemithyroid. 4.    Mild ventricular enlargement noted on the sagittal images.  Lab Results  Component Value Date   HGBA1C 5.6 08/10/2018   Lab Results  Component Value Date   QMVHQION62 952 08/10/2018   Lab Results  Component Value Date   TSH 1.420 08/10/2018   Lab Results  Component Value Date   ESRSEDRATE 31 08/10/2018    Past Medical History:  Diagnosis Date  . ABDOMINAL AORTIC ANEURYSM   . Anemia   . Arthritis    HANDS  . Blood transfusion without reported diagnosis    AUGUST 5TH 2017 x2   . CAD   . CONGESTIVE HEART FAILURE, SYSTOLIC, CHRONIC   . CT, CHEST, ABNORMAL   . GERD (gastroesophageal reflux disease)   . Heart attack ([place])    x 2  . History of colon polyps 01/05/2014   adenomatous polyps  .  HYPERLIPIDEMIA   . HYPERTENSION   . ISCHEMIC CARDIOMYOPATHY   . Lower GI bleed 01/2014  . 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 Numbness and tingling   . 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 Paresthesia of both hands   . TOBACCO ABUSE     Past Surgical History:  Procedure Laterality Date  . bare metal stent placement     left anterior descending artery x 2  . COLONOSCOPY  N/A 01/05/2014   Procedure: COLONOSCOPY;  Surgeon: Jerene Bears, MD;  Location: [facility];  Service: Endoscopy;  Laterality: N/A;  . COLONOSCOPY N/A 01/06/2014   Procedure: COLONOSCOPY;  Surgeon: Jerene Bears, MD;  Location: [facility];  Service: Endoscopy;  Laterality: N/A;  . COLONOSCOPY    . POLYPECTOMY       Medications:  Outpatient Encounter Medications as of 07/12/2019  Medication Sig  . carvedilol (COREG) 6.25 MG tablet TAKE 1 TABLET(6.25 MG) BY MOUTH TWICE DAILY  . clopidogrel (PLAVIX) 75 MG tablet TAKE 1 TABLET BY MOUTH EVERY DAY  . docusate sodium (EQUATE STOOL SOFTENER) 100 MG capsule Take 100 mg by mouth 2 (two) times daily.  . iron polysaccharides (NIFEREX) 150 MG capsule Take 150 mg by mouth daily.  Marland Kitchen losartan-hydrochlorothiazide (HYZAAR) 100-25 MG tablet Take 1 tablet by mouth daily.  . pantoprazole (PROTONIX) 40 MG tablet Take 40 mg by mouth daily.  . simvastatin (ZOCOR) 40 MG tablet TAKE 1 TABLET(40 MG) BY MOUTH DAILY AT 6 PM  . VITAMIN D, CHOLECALCIFEROL, PO Take 1 tablet by mouth 2 (two) times a week.  . nitroGLYCERIN (NITROSTAT) 0.4 MG SL tablet Place 1 tablet (0.4 mg total) under the tongue every 5 (five) minutes as needed for chest pain. (Patient not taking: Reported on 07/12/2019)   No facility-administered encounter medications on file as of 07/12/2019.    Allergies: No Known Allergies  Family History: Family History  Problem Relation Age of Onset  . Lymphoma Mother 59  . Colon polyps Father   . Depression Father 58       died of suicide  . Heart attack Father   . Diabetes Maternal Grandmother   . Anemia Sister   . Colon polyps Sister   . Lung cancer Sister   . Colon cancer Neg Hx   . Esophageal cancer Neg Hx   . Rectal cancer Neg Hx   . Stomach cancer Neg Hx     Social History: Social History   Tobacco Use  . Smoking status: Former Smoker    Types: Cigarettes, E-cigarettes    Quit date: 2013    Years since quitting: 8.1  . Smokeless tobacco:  Never Used  Substance Use Topics  . Alcohol use: Not Currently  . Drug use: No   Social History   Social History Narrative   Right-handed.   Three cups caffeine daily.   Lives at home with her husband.    Vital Signs:  BP (!) 150/78   Pulse 72   Ht '5\' 3"'  (1.6 m)   Wt 118 lb 12.8 oz (53.9 kg)   SpO2 98%   BMI 21.04 kg/m   Neurological Exam: MENTAL STATUS including orientation to time, place, person, recent and remote memory, attention span and concentration, language, and fund of knowledge is normal.  Speech is not dysarthric.  CRANIAL NERVES: II:  No visual field defects.    III-IV-VI: Pupils equal round and reactive to light.  Normal conjugate, extra-ocular eye movements  in all directions of gaze.  No nystagmus.  No ptosis.   V:  Normal facial sensation.    VII:  Normal facial symmetry and movements.   VIII:  Normal hearing and vestibular function.   IX-X:  Normal palatal movement.   XI:  Normal shoulder shrug and head rotation.   XII:  Normal tongue strength and range of motion, no deviation or fasciculation.  MOTOR:  No atrophy, fasciculations or abnormal movements.  No pronator drift.   Upper Extremity:  Right  Left  Deltoid  5/5   5/5   Biceps  5/5   5/5   Triceps  5/5   5/5   Infraspinatus 5/5  5/5  Medial pectoralis 5/5  5/5  Wrist extensors  5/5   5/5   Wrist flexors  5/5   5/5   Finger extensors  4/5   5/5   Finger flexors  5/5   5/5   Dorsal interossei  4/5   5-/5   Abductor pollicis  5/5   5/5   Tone (Ashworth scale)  0  0   Lower Extremity:  Right  Left  Hip flexors  5/5   5/5   Hip extensors  5/5   5/5   Adductor 5/5  5/5  Abductor 5/5  5/5  Knee flexors  5/5   5/5   Knee extensors  5/5   5/5   Dorsiflexors  5/5   5/5   Plantarflexors  5/5   5/5   Toe extensors  5/5   5/5   Toe flexors  5/5   5/5   Tone (Ashworth scale)  0  0   MSRs:  Right        Left                  brachioradialis 2+  2+  biceps 2+  2+  triceps 2+  2+  patellar 0   0  ankle jerk 0  0  Hoffman no  no  plantar response down  down   SENSORY:  Trace vibration at the MCP and knees, absent below the ankles.  Temperature and pin prick is reduced over the arms and legs. Rhomberg sign is present.  COORDINATION/GAIT: Normal finger-to- nose-finger.  Intact rapid alternating movements bilaterally.  Gait appears ataxic, unsteady, unassisted.  IMPRESSION: Sensory neuropathy, ?chronic inflammatory sensory polyradiculoneuropathy.  Prior testing has been summarized above, personally reviewed.   - Proceed with CSF testing to look for albuminocytologic dissociation  - Check CSF cell count and diff, protein, glucose, IgG index, oligoclonal bands, myelin basic protein, flow cytology, ACE  - Patient educated on daily foot inspection, fall prevention, and safety precautions around the home.  - Referral to PT for gait training  - Use a walker, do not walk unassisted  - Use shower chair  Further recommendations pending results.  Total time spent:  65 min  Thank you for allowing me to participate in patient's care.  If I can answer any additional questions, I would be pleased to do so.    Sincerely,    Khalifa Knecht K. Posey Pronto, DO

## 2019-07-12 NOTE — Patient Instructions (Addendum)
Start physical therapy for gait training  Start using a walker  Use a shower chair   We will refer you for a spinal tap to [facility].  They will contact you with additional details to schedule the procedure.

## 2019-07-13 ENCOUNTER — Telehealth: Payer: Self-pay | Admitting: *Deleted

## 2019-07-13 NOTE — Telephone Encounter (Signed)
Hi,     Working Pre-Op pool today [name] had BMS to LAD in 1998, ISR with intervention in 2008, along with current diagnosis of PVD of the  bilateral LE, and subclavian stenosis, followed by Dr. Gwenlyn Found with medical management. You saw her last in 1/21. She is to have a Lumbar puncture by [facility] on date TBD. They would like to hold Plavix for this. Will you please make recommendations on how long to hold Plavix so that I can send them a pre-op clearance to proceed?   Thanks Jory Sims DNP, ANP, AACC

## 2019-07-13 NOTE — Telephone Encounter (Signed)
   Primary Cardiologist: Jenkins Rouge, MD  Chart reviewed as part of pre-operative protocol coverage.nShe has been seen by Dr. Johnsie Cancel for CAD.  He has seen her in January 2021, she was stable from CV standpoint. He has given recommendations. She is okay to hold Plavix for 7 days prior to procedure-LP.    Given past medical history and time since last visit, based on ACC/AHA guidelines, LAURON OFFENBACHER would be at acceptable risk for the planned procedure without further cardiovascular testing.   I will route this recommendation to the requesting party via Epic fax function and remove from pre-op pool.  Please call with questions.  Phill Myron. Anela Bensman DNP, ANP, AACC  07/13/2019, 1:40 PM

## 2019-07-13 NOTE — Telephone Encounter (Signed)
   [facility] Pre-operative Risk Assessment    Request for surgical clearance:  1. What type of surgery is being performed? LUMBAR PUNTCURE   2. When is this surgery scheduled? TBD   3. What type of clearance is required (medical clearance vs. Pharmacy clearance to hold med vs. Both)? MEDICAL  4. Are there any medications that need to be held prior to surgery and how long? PLAVIX   5. Practice name and name of physician performing surgery? [facility]; DOCTOR NOT LISTED   6. What is your office phone number [phone]    7.   What is your office fax number [phone]  8.   Anesthesia type (None, local, MAC, general) ? LOCAL   Julaine Hua 07/13/2019, 11:32 AM  _________________________________________________________________   (provider comments below)

## 2019-07-13 NOTE — Telephone Encounter (Signed)
Ok to hold plavix for 7 days before LP

## 2019-07-20 ENCOUNTER — Other Ambulatory Visit (HOSPITAL_COMMUNITY)
Admission: RE | Admit: 2019-07-20 | Discharge: 2019-07-20 | Disposition: A | Discharge status: Home / Self Care | Payer: PPO | Source: Ambulatory Visit | Attending: Neurology | Admitting: Neurology

## 2019-07-20 ENCOUNTER — Ambulatory Visit
Admission: RE | Admit: 2019-07-20 | Discharge: 2019-07-20 | Disposition: A | Discharge status: Home / Self Care | Payer: PPO | Source: Ambulatory Visit | Attending: Neurology | Admitting: Neurology

## 2019-07-20 ENCOUNTER — Other Ambulatory Visit: Payer: Self-pay

## 2019-07-20 VITALS — BP 173/73 | HR 62

## 2019-07-20 DIAGNOSIS — G629 Polyneuropathy, unspecified: Secondary | ICD-10-CM | POA: Insufficient documentation

## 2019-07-20 DIAGNOSIS — G3281 Cerebellar ataxia in diseases classified elsewhere: Secondary | ICD-10-CM | POA: Diagnosis not present

## 2019-07-20 DIAGNOSIS — R269 Unspecified abnormalities of gait and mobility: Secondary | ICD-10-CM

## 2019-07-20 DIAGNOSIS — R202 Paresthesia of skin: Secondary | ICD-10-CM

## 2019-07-20 NOTE — Discharge Instructions (Signed)
     Lumbar Puncture Discharge Instructions  1. Go home and rest quietly for the next 24 hours.  It is important to lie flat for the next 24 hours.  Get up only to go to the restroom.  You may lie in the bed or on a couch on your back, your stomach, your left side or your right side.  You may have one pillow under your head.  You may have pillows between your knees while you are on your side or under your knees while you are on your back.  2. DO NOT drive today.  Recline the seat as far back as it will go, while still wearing your seat belt, on the way home.  3. You may get up to go to the bathroom as needed.  You may sit up for 10 minutes to eat.  You may resume your normal diet and medications unless otherwise indicated.  Drink lots of extra fluids today and tomorrow.  4. The incidence of headache, nausea, or vomiting is about 5% (one in 20 patients).  If you develop a headache, lie flat and drink plenty of fluids until the headache goes away.  Caffeinated beverages may be helpful.  If you develop severe nausea and vomiting or a headache that does not go away with flat bed rest, call the physician who sent you here.   5. You may resume normal activities after your 24 hours of bed rest is over; however, do not exert yourself strongly or do any heavy lifting tomorrow.  6. Call your physician for a follow-up appointment.   7. If you have any questions  after you arrive home, please call [phone].  Discharge instructions have been explained to the patient.  The patient, or the person responsible for the patient, fully understands these instructions.  YOU MAY RESTART YOUR PLAVIX TODAY. 

## 2019-07-20 NOTE — Progress Notes (Signed)
Blood obtained from [facility] for LP labs by [employer]. Pt tolerated procedure well. Site is unremarkable.

## 2019-07-21 LAB — CYTOLOGY - NON PAP

## 2019-08-02 LAB — OLIGOCLONAL BANDS, CSF + SERM

## 2019-08-02 LAB — CSF CELL COUNT WITH DIFFERENTIAL
RBC Count, CSF: 1 cells/uL — ABNORMAL HIGH
WBC, CSF: 0 cells/uL (ref 0–5)

## 2019-08-02 LAB — CNS IGG SYNTHESIS RATE, CSF+BLOOD
Albumin Serum: 3.8 g/dL (ref 3.2–4.6)
Albumin, CSF: 35.3 mg/dL (ref 8.0–42.0)
CNS-IgG Synthesis Rate: 5.9 mg/24 h — ABNORMAL HIGH (ref <=3.3)
IgG (Immunoglobin G), Serum: 1680 mg/dL — ABNORMAL HIGH (ref 600–1540)
IgG Total CSF: 9.3 mg/dL — ABNORMAL HIGH (ref 0.8–7.7)
IgG-Index: 0.6 (ref <0.66)

## 2019-08-02 LAB — GLUCOSE, CSF: Glucose, CSF: 60 mg/dL (ref 40–80)

## 2019-08-02 LAB — ANGIOTENSIN CONVERTING ENZYME, CSF: ACE, CSF: 2 U/L (ref <=15)

## 2019-08-02 LAB — PROTEIN, CSF: Total Protein, CSF: 62 mg/dL — ABNORMAL HIGH (ref 15–60)

## 2019-08-02 LAB — MYELIN BASIC PROTEIN, CSF: Myelin Basic Protein: <2 mcg/L (ref 2.0–4.0)

## 2019-08-03 ENCOUNTER — Telehealth: Payer: Self-pay | Admitting: Neurology

## 2019-08-03 NOTE — Telephone Encounter (Signed)
Thank you. I will let him know.

## 2019-08-03 NOTE — Telephone Encounter (Signed)
I attempted to contact patient via phone today regarding the results of CSF results, however there was no answer so a message was left for patient to call the office to schedule VV/telelephone visit to discuss results.

## 2019-08-03 NOTE — Telephone Encounter (Signed)
It will be much easier to discuss all the findings directly with me on Thursday.  Results are posted to MyChart that they have access to, but it will not make sense, so best to keep appointment.

## 2019-08-03 NOTE — Telephone Encounter (Signed)
Patient's husband called in to schedule a telephone visit on Thursday for results. He is asking if he can talk with a nurse today to get some information. Thanks

## 2019-08-04 ENCOUNTER — Encounter: Payer: Self-pay | Admitting: Neurology

## 2019-08-04 NOTE — Progress Notes (Signed)
Virtual Visit via Telephone  The purpose of this virtual visit is to provide medical care while limiting exposure to the novel coronavirus.    Consent was obtained for telephone visit:  Yes.   Answered questions that patient had about telehealth interaction:  Yes.   I discussed the limitations, risks, security and privacy concerns of performing an evaluation and management service by telemedicine. I also discussed with the patient that there may be a patient responsible charge related to this service. The patient expressed understanding and agreed to proceed.  Pt location: Home Physician Location: office Name of referring provider:  Reynold Bowen, MD I connected with [name] at patients initiation/request on 08/05/2019 at  7:50 AM EST by telephone and verified that I am speaking with the correct person using two identifiers. Pt MRN:  [id number] Pt DOB:  [birth year] Telephone Participants:  [name];  husband   History of Present Illness: This is a 69 y.o. female returning for follow-up of sensory neuropathy and to discuss results of CSF testing.    UPDATE 08/05/2019: She underwent lumbar puncture on 2/16 which showed borderline elevated protein (62, normal < 60), nonspecific elevation in IgG protein and 5 OCB which was present in both serum and CSF.  She tolerated LP well and did not have headache.  Clinically, there has been no progression in numbness, weakness, or imbalance.  She has not started physical therapy yet.  She is complaining of achy and throbbing hand pain which is worse by evening.  She has mild, intermitting tingling.    History of present illness: Starting in June 2019, she began having numbness over the right hand and arm which then progressed into both arms and legs over the next few weeks. She has sensation over the chest and face.  She has some numbness in her groin.  Symptoms are constant and worse on the right side. She has generalized weakness, reports  dropping objects.  She has imbalance and has fallen three times in December.  She walks unassisted and has not done physical therapy.    Prior work-up by Dr. Krista Blue has been extensive and including imaging of the cervical, thoracic, and lumbar spine which shows mild degenerative changes only.  NCS/EMG showed absent sensory responses diffusely.  Serology testing for autoimmune, infectious, metabolic, nutritional deficiency, and paraneoplastic disease was negative.     DATA: CSF 07/20/2019:  R1  W0  P62*  G60  IgG index 0.6, OCB >5 in serum and CSF, MPB neg, ACE neg, cytology neg  Labs 08/10/2018:  Paraneoplastic antibody neg, vitamin B5 24, Lyme neg, vitamin B12 341, RPR neg, HIV neg, B1 105,ACE neg, ANA neg, hepatitis panel neg, copper 127, ESR 31, CK 129  NCS/EMG of the right arm and leg 08/10/2018: This is an abnormal study. There is electrodiagnostic evidence of absent sensory responses on all the sensory nervestested, suggestive of sensory neuronopathy,  MRI lumbar spine wo contrast 08/16/2018:  Mild abnormal MRI scan lumbar spine showing mild facet and disc degenerative changes resulting in mild foraminal narrowing left greater than right L3-4 and L4-5 and L5-S1 but without definite compression.  MRI thoracic spine wo contrast 08/16/2018: Unremarkable MRI scan of thoracic spine showing only minor disc signal abnormalities. No frank disc herniation, compression or foraminal encroachment is noted  MRI cervical spine wo contrast 07/26/2018: This MRI of the cervical spine without contrast shows the following: 1. The spinal cord appears normal. 2. Mild multilevel degenerative changes as detailed above that  do not lead to spinal stenosis or nerve root compression. 3. 6 mm cyst in the right hemithyroid. 4. Mild ventricular enlargement noted on the sagittal images.  Assessment and Plan:  Sensory neuropathy, unclear etiology, despite extensive evaluation as noted above.  Her CSF shows  borderline elevation in protein with normal cell count.  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  Risks and benefits of steroids discussed. She was encouraged to start calcium supplement and PPI. Although her hand pain is not clearly suggestive of neuropathy (throbbing, achy), I will offer her a trial of gabapentin 389m at bedtime to see if it helps. OK to titrate as needed.    Follow Up Instructions:   I discussed the assessment and treatment plan with the patient. The patient was provided an opportunity to ask questions and all were answered. The patient agreed with the plan and demonstrated an understanding of the instructions.   The patient was advised to call back or seek an in-person evaluation if the symptoms worsen or if the condition fails to improve as anticipated.   Total Time spent in visit with the patient was:  22 min, of which 100% of the time was spent in counseling and/or coordinating care.   Pt understands and agrees with the plan of care outlined.     DAlda Berthold DO

## 2019-08-05 ENCOUNTER — Other Ambulatory Visit: Payer: Self-pay

## 2019-08-05 ENCOUNTER — Telehealth (INDEPENDENT_AMBULATORY_CARE_PROVIDER_SITE_OTHER): Payer: PPO | Admitting: Neurology

## 2019-08-05 VITALS — Ht 63.0 in | Wt 117.0 lb

## 2019-08-05 DIAGNOSIS — G6181 Chronic inflammatory demyelinating polyneuritis: Secondary | ICD-10-CM

## 2019-08-05 DIAGNOSIS — G629 Polyneuropathy, unspecified: Secondary | ICD-10-CM | POA: Diagnosis not present

## 2019-08-05 MED ORDER — GABAPENTIN 300 MG PO CAPS: 300.0000 mg | ORAL_CAPSULE | Freq: Every day | ORAL | 5 refills | Status: DC

## 2019-08-05 NOTE — Progress Notes (Signed)
Orders placed. Infusion scheduled and follow up scheduled.  Patient aware.

## 2019-08-16 ENCOUNTER — Ambulatory Visit (HOSPITAL_COMMUNITY)
Admission: RE | Admit: 2019-08-16 | Discharge: 2019-08-16 | Disposition: A | Discharge status: Home / Self Care | Payer: PPO | Source: Ambulatory Visit | Attending: Internal Medicine | Admitting: Internal Medicine

## 2019-08-16 ENCOUNTER — Other Ambulatory Visit: Payer: Self-pay

## 2019-08-16 DIAGNOSIS — G6181 Chronic inflammatory demyelinating polyneuritis: Secondary | ICD-10-CM | POA: Insufficient documentation

## 2019-08-16 MED ORDER — SODIUM CHLORIDE 0.9 % IV SOLN
1000.0000 mg | Freq: Every day | INTRAVENOUS | Status: DC
  Administered 2019-08-16: 1000 mg via INTRAVENOUS
  Filled 2019-08-16: qty 1000

## 2019-08-16 MED ORDER — SODIUM CHLORIDE 0.9 % IV SOLN
INTRAVENOUS | Status: DC | PRN
  Administered 2019-08-16: 250 mL via INTRAVENOUS

## 2019-08-16 NOTE — Progress Notes (Signed)
PATIENT CARE CENTER NOTE  Diagnosis: CIPD   Provider: Narda Amber, DO   Procedure: IV Solumedrol   Note: Patient received Solumedrol infusion via PIV. Tolerated well with no adverse reaction. BP elevated pre and post-transfusion. Md office notified of elevated BP and advised its alright to administer infusion. Patient to received four more doses this week for a total of 5 doses. Discharge instructions given. Patient alert, oriented and ambulatory at discharge.

## 2019-08-16 NOTE — Discharge Instructions (Signed)
Methylprednisolone Solution for Injection What is this medicine? METHYLPREDNISOLONE (meth ill pred NISS oh lone) is a corticosteroid. It is commonly used to treat inflammation of the skin, joints, lungs, and other organs. Common conditions treated include asthma, allergies, and arthritis. It is also used for other conditions, such as blood disorders and diseases of the adrenal glands. This medicine may be used for other purposes; ask your health care provider or pharmacist if you have questions. COMMON BRAND NAME(S): A-Methapred, Solu-Medrol What should I tell my health care provider before I take this medicine? They need to know if you have any of these conditions:  Cushing's syndrome  eye disease, vision problems  diabetes  glaucoma  heart disease  high blood pressure  infection (especially a virus infection such as chickenpox, cold sores, or herpes)  liver disease  mental illness  myasthenia gravis  osteoporosis  recently received or scheduled to receive a vaccine  seizures  stomach or intestine problems  thyroid disease  an unusual or allergic reaction to lactose, methylprednisolone, other medicines, foods, dyes, or preservatives  pregnant or trying to get pregnant  breast-feeding How should I use this medicine? This medicine is for injection or infusion into a vein. It is also for injection into a muscle. It is given by a health care professional in a hospital or clinic setting. Talk to your pediatrician regarding the use of this medicine in children. While this drug may be prescribed for selected conditions, precautions do apply. Overdosage: If you think you have taken too much of this medicine contact a poison control center or emergency room at once. NOTE: This medicine is only for you. Do not share this medicine with others. What if I miss a dose? This does not apply. What may interact with this medicine? Do not take this medicine with any of the following  medications:  alefacept  echinacea  iopamidol  live virus vaccines  metyrapone  mifepristone This medicine may also interact with the following medications:  amphotericin B  aspirin and aspirin-like medicines  certain antibiotics like erythromycin, clarithromycin, troleandomycin  certain medicines for diabetes  certain medicines for fungal infection like ketoconazole  certain medicines for seizures like carbamazepine, phenobarbital, phenytoin  certain medicines that treat or prevent blood clots like warfarin  cyclosporine  digoxin  diuretics  female hormones, like estrogens and birth control pills  isoniazid  NSAIDS, medicines for pain and inflammation, like ibuprofen or naproxen  other medicines for myasthenia gravis  rifampin  vaccines This list may not describe all possible interactions. Give your health care provider a list of all the medicines, herbs, non-prescription drugs, or dietary supplements you use. Also tell them if you smoke, drink alcohol, or use illegal drugs. Some items may interact with your medicine. What should I watch for while using this medicine? Tell your doctor or healthcare professional if your symptoms do not start to get better or if they get worse. Do not stop taking except on your doctor's advice. You may develop a severe reaction. Your doctor will tell you how much medicine to take. Your condition will be monitored carefully while you are receiving this medicine. This medicine may increase your risk of getting an infection. Tell your doctor or health care professional if you are around anyone with measles or chickenpox, or if you develop sores or blisters that do not heal properly. This medicine may increase blood sugar. Ask your healthcare provider if changes in diet or medicines are needed if you have diabetes. Tell  your doctor or health care professional right away if you have any change in your eyesight. Using this medicine for a  long time may increase your risk of low bone mass. Talk to your doctor about bone health. What side effects may I notice from receiving this medicine? Side effects that you should report to your doctor or health care professional as soon as possible:  allergic reactions like skin rash, itching or hives, swelling of the face, lips, or tongue  bloody or tarry stools  hallucination, loss of contact with reality  muscle cramps  muscle pain  palpitations  signs and symptoms of high blood sugar such as being more thirsty or hungry or having to urinate more than normal. You may also feel very tired or have blurry vision.  signs and symptoms of infection like fever or chills; cough; sore throat; pain or trouble passing urine  trouble passing urine Side effects that usually do not require medical attention (report to your doctor or health care professional if they continue or are bothersome):  changes in emotions or mood  constipation  diarrhea  excessive hair growth on the face or body  headache  nausea, vomiting  pain, redness, or irritation at site where injected  trouble sleeping  weight gain This list may not describe all possible side effects. Call your doctor for medical advice about side effects. You may report side effects to FDA at 1-800-FDA-1088. Where should I keep my medicine? This drug is given in a hospital or clinic and will not be stored at home. NOTE: This sheet is a summary. It may not cover all possible information. If you have questions about this medicine, talk to your doctor, pharmacist, or health care provider.  2020 Elsevier/Gold Standard (2018-02-19 09:12:19)

## 2019-08-17 ENCOUNTER — Ambulatory Visit (HOSPITAL_COMMUNITY)
Admission: RE | Admit: 2019-08-17 | Discharge: 2019-08-17 | Disposition: A | Discharge status: Home / Self Care | Payer: PPO | Source: Ambulatory Visit | Attending: Internal Medicine | Admitting: Internal Medicine

## 2019-08-17 DIAGNOSIS — G6181 Chronic inflammatory demyelinating polyneuritis: Secondary | ICD-10-CM | POA: Diagnosis not present

## 2019-08-17 MED ORDER — SODIUM CHLORIDE 0.9 % IV SOLN
1000.0000 mg | Freq: Once | INTRAVENOUS | Status: AC
  Administered 2019-08-17: 1000 mg via INTRAVENOUS
  Filled 2019-08-17: qty 8

## 2019-08-17 MED ORDER — SODIUM CHLORIDE 0.9 % IV SOLN
INTRAVENOUS | Status: DC | PRN
  Administered 2019-08-17: 09:00:00 250 mL via INTRAVENOUS

## 2019-08-17 NOTE — Discharge Instructions (Signed)
Methylprednisolone Solution for Injection What is this medicine? METHYLPREDNISOLONE (meth ill pred NISS oh lone) is a corticosteroid. It is commonly used to treat inflammation of the skin, joints, lungs, and other organs. Common conditions treated include asthma, allergies, and arthritis. It is also used for other conditions, such as blood disorders and diseases of the adrenal glands. This medicine may be used for other purposes; ask your health care provider or pharmacist if you have questions. COMMON BRAND NAME(S): A-Methapred, Solu-Medrol What should I tell my health care provider before I take this medicine? They need to know if you have any of these conditions:  Cushing's syndrome  eye disease, vision problems  diabetes  glaucoma  heart disease  high blood pressure  infection (especially a virus infection such as chickenpox, cold sores, or herpes)  liver disease  mental illness  myasthenia gravis  osteoporosis  recently received or scheduled to receive a vaccine  seizures  stomach or intestine problems  thyroid disease  an unusual or allergic reaction to lactose, methylprednisolone, other medicines, foods, dyes, or preservatives  pregnant or trying to get pregnant  breast-feeding How should I use this medicine? This medicine is for injection or infusion into a vein. It is also for injection into a muscle. It is given by a health care professional in a hospital or clinic setting. Talk to your pediatrician regarding the use of this medicine in children. While this drug may be prescribed for selected conditions, precautions do apply. Overdosage: If you think you have taken too much of this medicine contact a poison control center or emergency room at once. NOTE: This medicine is only for you. Do not share this medicine with others. What if I miss a dose? This does not apply. What may interact with this medicine? Do not take this medicine with any of the following  medications:  alefacept  echinacea  iopamidol  live virus vaccines  metyrapone  mifepristone This medicine may also interact with the following medications:  amphotericin B  aspirin and aspirin-like medicines  certain antibiotics like erythromycin, clarithromycin, troleandomycin  certain medicines for diabetes  certain medicines for fungal infection like ketoconazole  certain medicines for seizures like carbamazepine, phenobarbital, phenytoin  certain medicines that treat or prevent blood clots like warfarin  cyclosporine  digoxin  diuretics  female hormones, like estrogens and birth control pills  isoniazid  NSAIDS, medicines for pain and inflammation, like ibuprofen or naproxen  other medicines for myasthenia gravis  rifampin  vaccines This list may not describe all possible interactions. Give your health care provider a list of all the medicines, herbs, non-prescription drugs, or dietary supplements you use. Also tell them if you smoke, drink alcohol, or use illegal drugs. Some items may interact with your medicine. What should I watch for while using this medicine? Tell your doctor or healthcare professional if your symptoms do not start to get better or if they get worse. Do not stop taking except on your doctor's advice. You may develop a severe reaction. Your doctor will tell you how much medicine to take. Your condition will be monitored carefully while you are receiving this medicine. This medicine may increase your risk of getting an infection. Tell your doctor or health care professional if you are around anyone with measles or chickenpox, or if you develop sores or blisters that do not heal properly. This medicine may increase blood sugar. Ask your healthcare provider if changes in diet or medicines are needed if you have diabetes. Tell  your doctor or health care professional right away if you have any change in your eyesight. Using this medicine for a  long time may increase your risk of low bone mass. Talk to your doctor about bone health. What side effects may I notice from receiving this medicine? Side effects that you should report to your doctor or health care professional as soon as possible:  allergic reactions like skin rash, itching or hives, swelling of the face, lips, or tongue  bloody or tarry stools  hallucination, loss of contact with reality  muscle cramps  muscle pain  palpitations  signs and symptoms of high blood sugar such as being more thirsty or hungry or having to urinate more than normal. You may also feel very tired or have blurry vision.  signs and symptoms of infection like fever or chills; cough; sore throat; pain or trouble passing urine  trouble passing urine Side effects that usually do not require medical attention (report to your doctor or health care professional if they continue or are bothersome):  changes in emotions or mood  constipation  diarrhea  excessive hair growth on the face or body  headache  nausea, vomiting  pain, redness, or irritation at site where injected  trouble sleeping  weight gain This list may not describe all possible side effects. Call your doctor for medical advice about side effects. You may report side effects to FDA at 1-800-FDA-1088. Where should I keep my medicine? This drug is given in a hospital or clinic and will not be stored at home. NOTE: This sheet is a summary. It may not cover all possible information. If you have questions about this medicine, talk to your doctor, pharmacist, or health care provider.  2020 Elsevier/Gold Standard (2018-02-19 09:12:19)

## 2019-08-17 NOTE — Progress Notes (Addendum)
Patient received IV Solu-medrol as ordered by Narda Amber DO. Tolerated well, vitals stable, discharge instructions given, verbalized understanding. Patient alert, oriented and ambulatory at the time of discharge.   D/C time was 11:05 am

## 2019-08-18 ENCOUNTER — Ambulatory Visit (HOSPITAL_COMMUNITY)
Admission: RE | Admit: 2019-08-18 | Discharge: 2019-08-18 | Disposition: A | Discharge status: Home / Self Care | Payer: PPO | Source: Ambulatory Visit | Attending: Internal Medicine | Admitting: Internal Medicine

## 2019-08-18 ENCOUNTER — Telehealth: Payer: Self-pay

## 2019-08-18 ENCOUNTER — Other Ambulatory Visit: Payer: Self-pay

## 2019-08-18 ENCOUNTER — Telehealth: Payer: Self-pay | Admitting: Cardiovascular Disease

## 2019-08-18 ENCOUNTER — Telehealth: Payer: Self-pay | Admitting: Neurology

## 2019-08-18 DIAGNOSIS — G6181 Chronic inflammatory demyelinating polyneuritis: Secondary | ICD-10-CM | POA: Diagnosis not present

## 2019-08-18 MED ORDER — SODIUM CHLORIDE 0.9 % IV SOLN
1000.0000 mg | Freq: Once | INTRAVENOUS | Status: AC
  Administered 2019-08-18: 1000 mg via INTRAVENOUS
  Filled 2019-08-18: qty 8

## 2019-08-18 MED ORDER — SODIUM CHLORIDE 0.9 % IV SOLN
INTRAVENOUS | Status: DC | PRN
  Administered 2019-08-18: 250 mL via INTRAVENOUS

## 2019-08-18 NOTE — Discharge Instructions (Signed)
Methylprednisolone Solution for Injection What is this medicine? METHYLPREDNISOLONE (meth ill pred NISS oh lone) is a corticosteroid. It is commonly used to treat inflammation of the skin, joints, lungs, and other organs. Common conditions treated include asthma, allergies, and arthritis. It is also used for other conditions, such as blood disorders and diseases of the adrenal glands. This medicine may be used for other purposes; ask your health care provider or pharmacist if you have questions. COMMON BRAND NAME(S): A-Methapred, Solu-Medrol What should I tell my health care provider before I take this medicine? They need to know if you have any of these conditions:  Cushing's syndrome  eye disease, vision problems  diabetes  glaucoma  heart disease  high blood pressure  infection (especially a virus infection such as chickenpox, cold sores, or herpes)  liver disease  mental illness  myasthenia gravis  osteoporosis  recently received or scheduled to receive a vaccine  seizures  stomach or intestine problems  thyroid disease  an unusual or allergic reaction to lactose, methylprednisolone, other medicines, foods, dyes, or preservatives  pregnant or trying to get pregnant  breast-feeding How should I use this medicine? This medicine is for injection or infusion into a vein. It is also for injection into a muscle. It is given by a health care professional in a hospital or clinic setting. Talk to your pediatrician regarding the use of this medicine in children. While this drug may be prescribed for selected conditions, precautions do apply. Overdosage: If you think you have taken too much of this medicine contact a poison control center or emergency room at once. NOTE: This medicine is only for you. Do not share this medicine with others. What if I miss a dose? This does not apply. What may interact with this medicine? Do not take this medicine with any of the following  medications:  alefacept  echinacea  iopamidol  live virus vaccines  metyrapone  mifepristone This medicine may also interact with the following medications:  amphotericin B  aspirin and aspirin-like medicines  certain antibiotics like erythromycin, clarithromycin, troleandomycin  certain medicines for diabetes  certain medicines for fungal infection like ketoconazole  certain medicines for seizures like carbamazepine, phenobarbital, phenytoin  certain medicines that treat or prevent blood clots like warfarin  cyclosporine  digoxin  diuretics  female hormones, like estrogens and birth control pills  isoniazid  NSAIDS, medicines for pain and inflammation, like ibuprofen or naproxen  other medicines for myasthenia gravis  rifampin  vaccines This list may not describe all possible interactions. Give your health care provider a list of all the medicines, herbs, non-prescription drugs, or dietary supplements you use. Also tell them if you smoke, drink alcohol, or use illegal drugs. Some items may interact with your medicine. What should I watch for while using this medicine? Tell your doctor or healthcare professional if your symptoms do not start to get better or if they get worse. Do not stop taking except on your doctor's advice. You may develop a severe reaction. Your doctor will tell you how much medicine to take. Your condition will be monitored carefully while you are receiving this medicine. This medicine may increase your risk of getting an infection. Tell your doctor or health care professional if you are around anyone with measles or chickenpox, or if you develop sores or blisters that do not heal properly. This medicine may increase blood sugar. Ask your healthcare provider if changes in diet or medicines are needed if you have diabetes. Tell  your doctor or health care professional right away if you have any change in your eyesight. Using this medicine for a  long time may increase your risk of low bone mass. Talk to your doctor about bone health. What side effects may I notice from receiving this medicine? Side effects that you should report to your doctor or health care professional as soon as possible:  allergic reactions like skin rash, itching or hives, swelling of the face, lips, or tongue  bloody or tarry stools  hallucination, loss of contact with reality  muscle cramps  muscle pain  palpitations  signs and symptoms of high blood sugar such as being more thirsty or hungry or having to urinate more than normal. You may also feel very tired or have blurry vision.  signs and symptoms of infection like fever or chills; cough; sore throat; pain or trouble passing urine  trouble passing urine Side effects that usually do not require medical attention (report to your doctor or health care professional if they continue or are bothersome):  changes in emotions or mood  constipation  diarrhea  excessive hair growth on the face or body  headache  nausea, vomiting  pain, redness, or irritation at site where injected  trouble sleeping  weight gain This list may not describe all possible side effects. Call your doctor for medical advice about side effects. You may report side effects to FDA at 1-800-FDA-1088. Where should I keep my medicine? This drug is given in a hospital or clinic and will not be stored at home. NOTE: This sheet is a summary. It may not cover all possible information. If you have questions about this medicine, talk to your doctor, pharmacist, or health care provider.  2020 Elsevier/Gold Standard (2018-02-19 09:12:19)

## 2019-08-18 NOTE — Telephone Encounter (Signed)
Lexi from Infusion called to notify office that patient was at their office for infusion however was having increased BP. Lexi wanted to verify that it was ok to proceed with infusion. Patients BP @ 8:40am 165/67 and at 8:52 166/62. Spoke to Dr. Posey Pronto and was advised "ok to continue with infusion. She should monitor BP at home and talk to her PCP, if her BP medications need adjustment while getting steroids". I called Lexi back and communicated with her the advice from Dr. Posey Pronto. Lexi verbalized understanding.

## 2019-08-18 NOTE — Telephone Encounter (Signed)
The following message was left with [facility] on 08/18/19 at 12:05 PM.  Caller states his wife has been having steroid infusions and now her arm has gotten really red and swollen so they stopped them. He said she will start them back up next week as long as Dr. Posey Pronto says it's okay.  Please confirm with Dr. Posey Pronto and notify patient.

## 2019-08-18 NOTE — Telephone Encounter (Signed)
resume on Monday. Patient to take Tylenol for pain in hands, follow-up with PCP regarding BP as advised earlier today. Pt husband is going to call infusion center to get time for appointment for Monday and he stated that he would call her PCP and heart DR

## 2019-08-18 NOTE — Telephone Encounter (Signed)
Infusion center called back regarding patient c/o both hands hurting after starting IV. IV was stopped and they call office for advice. Dr. Posey Pronto advise to discontinue infusion today and resume on Monday. Patient to take Tylenol for pain in hands, follow-up with PCP regarding BP as advised earlier today. Infusion verbalized understanding and will instruct patient.

## 2019-08-18 NOTE — Telephone Encounter (Signed)
Patient's spouse called and said his wife cannot get back in for infusions until next Thursday, 08/26/19. He wants to be sure it is okay to wait that long.  See telephone notes from earlier today for further reference.

## 2019-08-18 NOTE — Telephone Encounter (Signed)
Yes, that's absolutely okay.

## 2019-08-18 NOTE — Progress Notes (Signed)
Patient received IV Solu-medrol as ordered by Morrie Sheldon. MD. Pre-infusion BP was 165/67. MD's office contacted. Infusion was okayed to continue, that patient should follow up with the PCP about the BP. At 10:20 am, patient endorsed pain around the infusion site. The infusion was stopped. The MD's office was contacted. Per MD, stop the infusion, tell patient to take OTC Tylenol as prescribed, resume the Solumedrol infusion on 08/23/2019, follow up with the PCP about the elevated BP. RN spoke with Hoyle Sauer. Discharge instructions were given to the patient, verbalized understanding. Patient alert, oriented and ambulatory at the time of discharge. Patient will call back to schedule appointments for the remaining infusions.

## 2019-08-18 NOTE — Telephone Encounter (Signed)
Called patient's husband (DPR) about his message. Patient's BP has been high when getting steroid infusions and shortly after. Patient's BP usually goes back down. Encouraged patient's husband to talk to the doctor who has been ordering the steroid infusions for advisement. Patient's husband agreed to plan and will talk to them when they call him back.

## 2019-08-18 NOTE — Telephone Encounter (Signed)
Pt c/o BP issue: STAT if pt c/o blurred vision, one-sided weakness or slurred speech  1. What are your last 5 BP readings?  140/70 yesterday at home  171/84 today at [facility] infusion center 150/79 today once she got home  2. Are you having any other symptoms (ex. Dizziness, headache, blurred vision, passed out)? Arm pain and redness from steroid infusion  3. What is your BP issue? BP gets high while the pt is at the steroid infusion clinic.  Today the patient had an incident where her arm started hurting and had some redness, but the Husband believes that was a result of the steroid infusion. The arm pain and redness are gone now that the patient is home.  The patient was told to reach out to her Heart doctor because of the spike in BP during infusions, so that's why the husband is calling

## 2019-08-18 NOTE — Telephone Encounter (Signed)
Pt called back and informed that next thursday 08/26/19. Pt husband said he called pt heart dr. Deborha Payment told her to take her anxiety meds about 30 mins before her appointment to see if that helps because her BP is normal at home '

## 2019-08-19 ENCOUNTER — Ambulatory Visit (HOSPITAL_COMMUNITY): Payer: PPO

## 2019-08-20 ENCOUNTER — Ambulatory Visit (HOSPITAL_COMMUNITY): Payer: PPO

## 2019-08-26 ENCOUNTER — Other Ambulatory Visit: Payer: Self-pay

## 2019-08-26 ENCOUNTER — Ambulatory Visit (HOSPITAL_COMMUNITY)
Admission: RE | Admit: 2019-08-26 | Discharge: 2019-08-26 | Disposition: A | Discharge status: Home / Self Care | Payer: PPO | Source: Ambulatory Visit | Attending: Internal Medicine | Admitting: Internal Medicine

## 2019-08-26 DIAGNOSIS — G6181 Chronic inflammatory demyelinating polyneuritis: Secondary | ICD-10-CM | POA: Diagnosis not present

## 2019-08-26 MED ORDER — SODIUM CHLORIDE 0.9 % IV SOLN
1000.0000 mg | Freq: Once | INTRAVENOUS | Status: AC
  Administered 2019-08-26: 09:00:00 1000 mg via INTRAVENOUS
  Filled 2019-08-26: qty 8

## 2019-08-26 MED ORDER — SODIUM CHLORIDE 0.9 % IV SOLN
INTRAVENOUS | Status: DC | PRN
  Administered 2019-08-26: 09:00:00 250 mL via INTRAVENOUS

## 2019-08-26 NOTE — Progress Notes (Signed)
PATIENT CARE CENTER NOTE  Diagnosis: CIPD   Provider: Narda Amber, DO   Procedure: IV Solumedrol   Note: Patient received Solumedrol infusion via PIV. Tolerated well with no adverse reaction. BP 154/69 post-infusion. Patient provider is aware of elevated BP and is following patient. Patient is to come back tomorrow for fifth dose of Solumedrol. Discharge instructions given. Patient alert, oriented and ambulatory at discharge.

## 2019-08-26 NOTE — Discharge Instructions (Signed)
Methylprednisolone Solution for Injection What is this medicine? METHYLPREDNISOLONE (meth ill pred NISS oh lone) is a corticosteroid. It is commonly used to treat inflammation of the skin, joints, lungs, and other organs. Common conditions treated include asthma, allergies, and arthritis. It is also used for other conditions, such as blood disorders and diseases of the adrenal glands. This medicine may be used for other purposes; ask your health care provider or pharmacist if you have questions. COMMON BRAND NAME(S): A-Methapred, Solu-Medrol What should I tell my health care provider before I take this medicine? They need to know if you have any of these conditions:  Cushing's syndrome  eye disease, vision problems  diabetes  glaucoma  heart disease  high blood pressure  infection (especially a virus infection such as chickenpox, cold sores, or herpes)  liver disease  mental illness  myasthenia gravis  osteoporosis  recently received or scheduled to receive a vaccine  seizures  stomach or intestine problems  thyroid disease  an unusual or allergic reaction to lactose, methylprednisolone, other medicines, foods, dyes, or preservatives  pregnant or trying to get pregnant  breast-feeding How should I use this medicine? This medicine is for injection or infusion into a vein. It is also for injection into a muscle. It is given by a health care professional in a hospital or clinic setting. Talk to your pediatrician regarding the use of this medicine in children. While this drug may be prescribed for selected conditions, precautions do apply. Overdosage: If you think you have taken too much of this medicine contact a poison control center or emergency room at once. NOTE: This medicine is only for you. Do not share this medicine with others. What if I miss a dose? This does not apply. What may interact with this medicine? Do not take this medicine with any of the following  medications:  alefacept  echinacea  iopamidol  live virus vaccines  metyrapone  mifepristone This medicine may also interact with the following medications:  amphotericin B  aspirin and aspirin-like medicines  certain antibiotics like erythromycin, clarithromycin, troleandomycin  certain medicines for diabetes  certain medicines for fungal infection like ketoconazole  certain medicines for seizures like carbamazepine, phenobarbital, phenytoin  certain medicines that treat or prevent blood clots like warfarin  cyclosporine  digoxin  diuretics  female hormones, like estrogens and birth control pills  isoniazid  NSAIDS, medicines for pain and inflammation, like ibuprofen or naproxen  other medicines for myasthenia gravis  rifampin  vaccines This list may not describe all possible interactions. Give your health care provider a list of all the medicines, herbs, non-prescription drugs, or dietary supplements you use. Also tell them if you smoke, drink alcohol, or use illegal drugs. Some items may interact with your medicine. What should I watch for while using this medicine? Tell your doctor or healthcare professional if your symptoms do not start to get better or if they get worse. Do not stop taking except on your doctor's advice. You may develop a severe reaction. Your doctor will tell you how much medicine to take. Your condition will be monitored carefully while you are receiving this medicine. This medicine may increase your risk of getting an infection. Tell your doctor or health care professional if you are around anyone with measles or chickenpox, or if you develop sores or blisters that do not heal properly. This medicine may increase blood sugar. Ask your healthcare provider if changes in diet or medicines are needed if you have diabetes. Tell  your doctor or health care professional right away if you have any change in your eyesight. Using this medicine for a  long time may increase your risk of low bone mass. Talk to your doctor about bone health. What side effects may I notice from receiving this medicine? Side effects that you should report to your doctor or health care professional as soon as possible:  allergic reactions like skin rash, itching or hives, swelling of the face, lips, or tongue  bloody or tarry stools  hallucination, loss of contact with reality  muscle cramps  muscle pain  palpitations  signs and symptoms of high blood sugar such as being more thirsty or hungry or having to urinate more than normal. You may also feel very tired or have blurry vision.  signs and symptoms of infection like fever or chills; cough; sore throat; pain or trouble passing urine  trouble passing urine Side effects that usually do not require medical attention (report to your doctor or health care professional if they continue or are bothersome):  changes in emotions or mood  constipation  diarrhea  excessive hair growth on the face or body  headache  nausea, vomiting  pain, redness, or irritation at site where injected  trouble sleeping  weight gain This list may not describe all possible side effects. Call your doctor for medical advice about side effects. You may report side effects to FDA at 1-800-FDA-1088. Where should I keep my medicine? This drug is given in a hospital or clinic and will not be stored at home. NOTE: This sheet is a summary. It may not cover all possible information. If you have questions about this medicine, talk to your doctor, pharmacist, or health care provider.  2020 Elsevier/Gold Standard (2018-02-19 09:12:19)

## 2019-08-27 ENCOUNTER — Ambulatory Visit (HOSPITAL_COMMUNITY)
Admission: RE | Admit: 2019-08-27 | Discharge: 2019-08-27 | Disposition: A | Discharge status: Home / Self Care | Payer: PPO | Source: Ambulatory Visit | Attending: Internal Medicine | Admitting: Internal Medicine

## 2019-08-27 DIAGNOSIS — G6181 Chronic inflammatory demyelinating polyneuritis: Secondary | ICD-10-CM | POA: Diagnosis not present

## 2019-08-27 MED ORDER — SODIUM CHLORIDE 0.9 % IV SOLN
1000.0000 mg | Freq: Once | INTRAVENOUS | Status: AC
  Administered 2019-08-27: 10:00:00 1000 mg via INTRAVENOUS
  Filled 2019-08-27: qty 8

## 2019-08-27 MED ORDER — SODIUM CHLORIDE 0.9 % IV SOLN
INTRAVENOUS | Status: DC | PRN
  Administered 2019-08-27: 250 mL via INTRAVENOUS

## 2019-08-27 NOTE — Discharge Instructions (Signed)
Methylprednisolone Solution for Injection What is this medicine? METHYLPREDNISOLONE (meth ill pred NISS oh lone) is a corticosteroid. It is commonly used to treat inflammation of the skin, joints, lungs, and other organs. Common conditions treated include asthma, allergies, and arthritis. It is also used for other conditions, such as blood disorders and diseases of the adrenal glands. This medicine may be used for other purposes; ask your health care provider or pharmacist if you have questions. COMMON BRAND NAME(S): A-Methapred, Solu-Medrol What should I tell my health care provider before I take this medicine? They need to know if you have any of these conditions:  Cushing's syndrome  eye disease, vision problems  diabetes  glaucoma  heart disease  high blood pressure  infection (especially a virus infection such as chickenpox, cold sores, or herpes)  liver disease  mental illness  myasthenia gravis  osteoporosis  recently received or scheduled to receive a vaccine  seizures  stomach or intestine problems  thyroid disease  an unusual or allergic reaction to lactose, methylprednisolone, other medicines, foods, dyes, or preservatives  pregnant or trying to get pregnant  breast-feeding How should I use this medicine? This medicine is for injection or infusion into a vein. It is also for injection into a muscle. It is given by a health care professional in a hospital or clinic setting. Talk to your pediatrician regarding the use of this medicine in children. While this drug may be prescribed for selected conditions, precautions do apply. Overdosage: If you think you have taken too much of this medicine contact a poison control center or emergency room at once. NOTE: This medicine is only for you. Do not share this medicine with others. What if I miss a dose? This does not apply. What may interact with this medicine? Do not take this medicine with any of the following  medications:  alefacept  echinacea  iopamidol  live virus vaccines  metyrapone  mifepristone This medicine may also interact with the following medications:  amphotericin B  aspirin and aspirin-like medicines  certain antibiotics like erythromycin, clarithromycin, troleandomycin  certain medicines for diabetes  certain medicines for fungal infection like ketoconazole  certain medicines for seizures like carbamazepine, phenobarbital, phenytoin  certain medicines that treat or prevent blood clots like warfarin  cyclosporine  digoxin  diuretics  female hormones, like estrogens and birth control pills  isoniazid  NSAIDS, medicines for pain and inflammation, like ibuprofen or naproxen  other medicines for myasthenia gravis  rifampin  vaccines This list may not describe all possible interactions. Give your health care provider a list of all the medicines, herbs, non-prescription drugs, or dietary supplements you use. Also tell them if you smoke, drink alcohol, or use illegal drugs. Some items may interact with your medicine. What should I watch for while using this medicine? Tell your doctor or healthcare professional if your symptoms do not start to get better or if they get worse. Do not stop taking except on your doctor's advice. You may develop a severe reaction. Your doctor will tell you how much medicine to take. Your condition will be monitored carefully while you are receiving this medicine. This medicine may increase your risk of getting an infection. Tell your doctor or health care professional if you are around anyone with measles or chickenpox, or if you develop sores or blisters that do not heal properly. This medicine may increase blood sugar. Ask your healthcare provider if changes in diet or medicines are needed if you have diabetes. Tell  your doctor or health care professional right away if you have any change in your eyesight. Using this medicine for a  long time may increase your risk of low bone mass. Talk to your doctor about bone health. What side effects may I notice from receiving this medicine? Side effects that you should report to your doctor or health care professional as soon as possible:  allergic reactions like skin rash, itching or hives, swelling of the face, lips, or tongue  bloody or tarry stools  hallucination, loss of contact with reality  muscle cramps  muscle pain  palpitations  signs and symptoms of high blood sugar such as being more thirsty or hungry or having to urinate more than normal. You may also feel very tired or have blurry vision.  signs and symptoms of infection like fever or chills; cough; sore throat; pain or trouble passing urine  trouble passing urine Side effects that usually do not require medical attention (report to your doctor or health care professional if they continue or are bothersome):  changes in emotions or mood  constipation  diarrhea  excessive hair growth on the face or body  headache  nausea, vomiting  pain, redness, or irritation at site where injected  trouble sleeping  weight gain This list may not describe all possible side effects. Call your doctor for medical advice about side effects. You may report side effects to FDA at 1-800-FDA-1088. Where should I keep my medicine? This drug is given in a hospital or clinic and will not be stored at home. NOTE: This sheet is a summary. It may not cover all possible information. If you have questions about this medicine, talk to your doctor, pharmacist, or health care provider.  2020 Elsevier/Gold Standard (2018-02-19 09:12:19)

## 2019-08-27 NOTE — Progress Notes (Signed)
PATIENT CARE CENTER NOTE  Diagnosis:CIPD   Provider:Patel, Donika, DO   Procedure:IV Solumedrol   Note:Patient received Solumedrol infusion via PIV. Tolerated well with no adverse reaction.  Vital signs wnl.Discharge instructions given. Patient alert, oriented and ambulatory at discharge.

## 2019-09-09 ENCOUNTER — Other Ambulatory Visit: Payer: Self-pay

## 2019-09-09 MED ORDER — CARVEDILOL 6.25 MG PO TABS: ORAL_TABLET | ORAL | 2 refills | Status: DC

## 2019-09-09 MED ORDER — SIMVASTATIN 40 MG PO TABS: ORAL_TABLET | ORAL | 2 refills | Status: DC

## 2019-09-17 ENCOUNTER — Other Ambulatory Visit: Payer: Self-pay

## 2019-09-17 ENCOUNTER — Ambulatory Visit (HOSPITAL_COMMUNITY)
Admission: RE | Admit: 2019-09-17 | Discharge: 2019-09-17 | Disposition: A | Discharge status: Home / Self Care | Payer: PPO | Source: Ambulatory Visit | Attending: Internal Medicine | Admitting: Internal Medicine

## 2019-09-17 DIAGNOSIS — G608 Other hereditary and idiopathic neuropathies: Secondary | ICD-10-CM | POA: Insufficient documentation

## 2019-09-17 MED ORDER — SODIUM CHLORIDE 0.9 % IV SOLN
1000.0000 mg | Freq: Once | INTRAVENOUS | Status: DC
  Filled 2019-09-17: qty 8

## 2019-09-17 MED ORDER — SODIUM CHLORIDE 0.9 % IV SOLN: INTRAVENOUS | Status: DC | PRN

## 2019-09-24 ENCOUNTER — Ambulatory Visit (HOSPITAL_COMMUNITY)
Admission: RE | Admit: 2019-09-24 | Discharge: 2019-09-24 | Disposition: A | Discharge status: Home / Self Care | Payer: PPO | Source: Ambulatory Visit | Attending: Internal Medicine | Admitting: Internal Medicine

## 2019-09-24 ENCOUNTER — Other Ambulatory Visit: Payer: Self-pay

## 2019-09-24 DIAGNOSIS — G608 Other hereditary and idiopathic neuropathies: Secondary | ICD-10-CM | POA: Diagnosis not present

## 2019-09-24 MED ORDER — SODIUM CHLORIDE 0.9 % IV SOLN
INTRAVENOUS | Status: DC | PRN
  Administered 2019-09-24: 250 mL via INTRAVENOUS

## 2019-09-24 MED ORDER — SODIUM CHLORIDE 0.9 % IV SOLN
1000.0000 mg | INTRAVENOUS | Status: DC
  Administered 2019-09-24: 1000 mg via INTRAVENOUS
  Filled 2019-09-24: qty 8

## 2019-09-24 NOTE — Discharge Instructions (Signed)
Methylprednisolone Solution for Injection What is this medicine? METHYLPREDNISOLONE (meth ill pred NISS oh lone) is a corticosteroid. It is commonly used to treat inflammation of the skin, joints, lungs, and other organs. Common conditions treated include asthma, allergies, and arthritis. It is also used for other conditions, such as blood disorders and diseases of the adrenal glands. This medicine may be used for other purposes; ask your health care provider or pharmacist if you have questions. COMMON BRAND NAME(S): A-Methapred, Solu-Medrol What should I tell my health care provider before I take this medicine? They need to know if you have any of these conditions:  Cushing's syndrome  eye disease, vision problems  diabetes  glaucoma  heart disease  high blood pressure  infection (especially a virus infection such as chickenpox, cold sores, or herpes)  liver disease  mental illness  myasthenia gravis  osteoporosis  recently received or scheduled to receive a vaccine  seizures  stomach or intestine problems  thyroid disease  an unusual or allergic reaction to lactose, methylprednisolone, other medicines, foods, dyes, or preservatives  pregnant or trying to get pregnant  breast-feeding How should I use this medicine? This medicine is for injection or infusion into a vein. It is also for injection into a muscle. It is given by a health care professional in a hospital or clinic setting. Talk to your pediatrician regarding the use of this medicine in children. While this drug may be prescribed for selected conditions, precautions do apply. Overdosage: If you think you have taken too much of this medicine contact a poison control center or emergency room at once. NOTE: This medicine is only for you. Do not share this medicine with others. What if I miss a dose? This does not apply. What may interact with this medicine? Do not take this medicine with any of the following  medications:  alefacept  echinacea  iopamidol  live virus vaccines  metyrapone  mifepristone This medicine may also interact with the following medications:  amphotericin B  aspirin and aspirin-like medicines  certain antibiotics like erythromycin, clarithromycin, troleandomycin  certain medicines for diabetes  certain medicines for fungal infection like ketoconazole  certain medicines for seizures like carbamazepine, phenobarbital, phenytoin  certain medicines that treat or prevent blood clots like warfarin  cyclosporine  digoxin  diuretics  female hormones, like estrogens and birth control pills  isoniazid  NSAIDS, medicines for pain and inflammation, like ibuprofen or naproxen  other medicines for myasthenia gravis  rifampin  vaccines This list may not describe all possible interactions. Give your health care provider a list of all the medicines, herbs, non-prescription drugs, or dietary supplements you use. Also tell them if you smoke, drink alcohol, or use illegal drugs. Some items may interact with your medicine. What should I watch for while using this medicine? Tell your doctor or healthcare professional if your symptoms do not start to get better or if they get worse. Do not stop taking except on your doctor's advice. You may develop a severe reaction. Your doctor will tell you how much medicine to take. Your condition will be monitored carefully while you are receiving this medicine. This medicine may increase your risk of getting an infection. Tell your doctor or health care professional if you are around anyone with measles or chickenpox, or if you develop sores or blisters that do not heal properly. This medicine may increase blood sugar. Ask your healthcare provider if changes in diet or medicines are needed if you have diabetes. Tell  your doctor or health care professional right away if you have any change in your eyesight. Using this medicine for a  long time may increase your risk of low bone mass. Talk to your doctor about bone health. What side effects may I notice from receiving this medicine? Side effects that you should report to your doctor or health care professional as soon as possible:  allergic reactions like skin rash, itching or hives, swelling of the face, lips, or tongue  bloody or tarry stools  hallucination, loss of contact with reality  muscle cramps  muscle pain  palpitations  signs and symptoms of high blood sugar such as being more thirsty or hungry or having to urinate more than normal. You may also feel very tired or have blurry vision.  signs and symptoms of infection like fever or chills; cough; sore throat; pain or trouble passing urine  trouble passing urine Side effects that usually do not require medical attention (report to your doctor or health care professional if they continue or are bothersome):  changes in emotions or mood  constipation  diarrhea  excessive hair growth on the face or body  headache  nausea, vomiting  pain, redness, or irritation at site where injected  trouble sleeping  weight gain This list may not describe all possible side effects. Call your doctor for medical advice about side effects. You may report side effects to FDA at 1-800-FDA-1088. Where should I keep my medicine? This drug is given in a hospital or clinic and will not be stored at home. NOTE: This sheet is a summary. It may not cover all possible information. If you have questions about this medicine, talk to your doctor, pharmacist, or health care provider.  2020 Elsevier/Gold Standard (2018-02-19 09:12:19)

## 2019-09-24 NOTE — Progress Notes (Signed)
Patient received IV Solu-medrol as ordered by Narda Amber DO. Tolerated well, vitals stable, discharge instructions given, verbalized understanding. Patient alert, oriented and ambulatory at the time of discharge

## 2019-10-12 ENCOUNTER — Ambulatory Visit (HOSPITAL_BASED_OUTPATIENT_CLINIC_OR_DEPARTMENT_OTHER)
Admission: RE | Admit: 2019-10-12 | Discharge: 2019-10-12 | Disposition: A | Discharge status: Home / Self Care | Payer: PPO | Source: Ambulatory Visit | Attending: Cardiovascular Disease | Admitting: Cardiovascular Disease

## 2019-10-12 ENCOUNTER — Encounter (HOSPITAL_COMMUNITY): Payer: PPO

## 2019-10-12 ENCOUNTER — Ambulatory Visit (HOSPITAL_COMMUNITY)
Admission: RE | Admit: 2019-10-12 | Discharge: 2019-10-12 | Disposition: A | Discharge status: Home / Self Care | Payer: PPO | Source: Ambulatory Visit | Attending: Cardiology | Admitting: Cardiology

## 2019-10-12 ENCOUNTER — Other Ambulatory Visit (HOSPITAL_COMMUNITY): Payer: Self-pay | Admitting: Cardiovascular Disease

## 2019-10-12 ENCOUNTER — Other Ambulatory Visit: Payer: Self-pay

## 2019-10-12 DIAGNOSIS — I6523 Occlusion and stenosis of bilateral carotid arteries: Secondary | ICD-10-CM | POA: Insufficient documentation

## 2019-10-12 DIAGNOSIS — I2583 Coronary atherosclerosis due to lipid rich plaque: Secondary | ICD-10-CM | POA: Insufficient documentation

## 2019-10-12 DIAGNOSIS — I2510 Atherosclerotic heart disease of native coronary artery without angina pectoris: Secondary | ICD-10-CM | POA: Insufficient documentation

## 2019-10-12 DIAGNOSIS — I739 Peripheral vascular disease, unspecified: Secondary | ICD-10-CM

## 2019-10-13 ENCOUNTER — Telehealth: Payer: Self-pay

## 2019-10-13 DIAGNOSIS — I6523 Occlusion and stenosis of bilateral carotid arteries: Secondary | ICD-10-CM

## 2019-10-13 DIAGNOSIS — I739 Peripheral vascular disease, unspecified: Secondary | ICD-10-CM

## 2019-10-13 DIAGNOSIS — I2510 Atherosclerotic heart disease of native coronary artery without angina pectoris: Secondary | ICD-10-CM

## 2019-10-13 DIAGNOSIS — R0989 Other specified symptoms and signs involving the circulatory and respiratory systems: Secondary | ICD-10-CM

## 2019-10-13 NOTE — Telephone Encounter (Signed)
Spoke to patient's husband recent carotid and aorta doppler results given.Advised to repeat in 12 months.

## 2019-10-22 ENCOUNTER — Ambulatory Visit (HOSPITAL_COMMUNITY)
Admission: RE | Admit: 2019-10-22 | Discharge: 2019-10-22 | Disposition: A | Discharge status: Home / Self Care | Payer: PPO | Source: Ambulatory Visit | Attending: Internal Medicine | Admitting: Internal Medicine

## 2019-10-22 ENCOUNTER — Other Ambulatory Visit: Payer: Self-pay

## 2019-10-22 DIAGNOSIS — G629 Polyneuropathy, unspecified: Secondary | ICD-10-CM | POA: Diagnosis not present

## 2019-10-22 MED ORDER — SODIUM CHLORIDE 0.9 % IV SOLN
INTRAVENOUS | Status: DC | PRN
  Administered 2019-10-22: 250 mL via INTRAVENOUS

## 2019-10-22 MED ORDER — SODIUM CHLORIDE 0.9 % IV SOLN
1000.0000 mg | INTRAVENOUS | Status: DC
  Administered 2019-10-22: 1000 mg via INTRAVENOUS
  Filled 2019-10-22: qty 8

## 2019-10-22 NOTE — Progress Notes (Addendum)
PATIENT CARE CENTER NOTE  Diagnosis: CIPD   Provider: Narda Amber, MD   Procedure: Solumedrol infusion    Note: Patient received Solumedrol infusion via PIV. This was patient's 7th of 10 infusions. Patient tolerated well with no adverse reaction. BP slightly elevated pre and post-infusion. Discharge instructions given. Patient to come back in 28 days for 8th infusion. Alert, oriented and ambulatory at discharge.

## 2019-10-25 ENCOUNTER — Telehealth: Payer: Self-pay | Admitting: Cardiovascular Disease

## 2019-10-25 NOTE — Telephone Encounter (Signed)
Lpm that I will submit in the appointment notes that her spouse will accompany her to the appointment on 5/25.

## 2019-10-25 NOTE — Telephone Encounter (Signed)
New Message   Patients spouse Clair Gulling is calling to request to come to the appt with patient so that he can hear the test results and make sure that she understands.

## 2019-10-26 ENCOUNTER — Ambulatory Visit: Payer: PPO | Admitting: Cardiovascular Disease

## 2019-10-26 ENCOUNTER — Other Ambulatory Visit: Payer: Self-pay

## 2019-10-26 ENCOUNTER — Encounter: Payer: Self-pay | Admitting: Cardiovascular Disease

## 2019-10-26 VITALS — BP 136/84 | HR 62 | Ht 63.0 in | Wt 115.2 lb

## 2019-10-26 DIAGNOSIS — I739 Peripheral vascular disease, unspecified: Secondary | ICD-10-CM

## 2019-10-26 DIAGNOSIS — I10 Essential (primary) hypertension: Secondary | ICD-10-CM

## 2019-10-26 DIAGNOSIS — I771 Stricture of artery: Secondary | ICD-10-CM

## 2019-10-26 NOTE — Assessment & Plan Note (Signed)
History of PAD with lower extremity arterial Doppler studies performed 10/12/2019 revealing a right ABI of 1.03 and a left of 0.98.  She does have moderate left iliac disease.  She says her right side is more symptomatic and she does have peripheral neuropathy which is currently being worked up and treated.  I suspect the majority of her problems are related to her neuropathy rather than vascular obstruction.  We will repeat aortoiliac and lower extremity arterial Doppler studies in 1 year after which I will see her back in follow-up.

## 2019-10-26 NOTE — Progress Notes (Signed)
10/26/2019 [name]   12/11/1950  [id number]  Primary Physician Reynold Bowen, MD Primary Cardiologist: Lorretta Harp MD Garret Reddish, [place], [place]  HPI:  [name] is a 69 y.o.  thin appearing married Caucasian female with no children is accompanied by her husband Clair Gulling today. She was referred by Dr. Johnsie Cancel, her cardiologist for evaluation of left subclavian artery stenosis and symptoms related to this.   I last saw her in the office 04/26/2019.  Her risk factors include treated hypertension and hyperlipidemia. She stopped smoking in 2008 after coronary intervention. She is never had a heart attack or stroke. She denies chest pain or shortness of breath. There is no family history for heart disease. She had anterior MI in 1998 and had bare-metal stenting of her LAD and had a re-intervention 2008. She has normal LV function. Recent carotid Dopplers performed 03/08/2019 revealed no evidence of ICA stenosis although there was moderate left subclavian artery stenosis with antegrade vertebral flow bilaterally and only a 9 mm blood pressure gradient across the upper extremities. She is being worked up for peripheral neuropathy at [facility]. I cannot elicit symptoms of left upper extremity claudication. She does get dizzy on a regular basis over the last 3 to 4 months lasting up to 30 seconds at a time for unclear reasons. I am not convinced that this is related to subclavian steal at this time.  I did perform lower extremity arterial Doppler studies on her 03/26/2019 revealing a right ABI of 1.0 and a left of 0.87 with moderate disease in her left iliac system although her complaints are of occasional right calf claudication.  Since I saw her 6 months ago she continues to complain of numbness predominantly on her right side but also her left side.  She denies left upper extremity discomfort.  She denies left lower extremity claudication.  I suspect the majority of her  symptoms are now or neurologic.    Current Meds  Medication Sig  . carvedilol (COREG) 6.25 MG tablet TAKE 1 TABLET(6.25 MG) BY MOUTH TWICE DAILY  . clopidogrel (PLAVIX) 75 MG tablet TAKE 1 TABLET BY MOUTH EVERY DAY  . docusate sodium (EQUATE STOOL SOFTENER) 100 MG capsule Take 100 mg by mouth 2 (two) times daily.  Marland Kitchen gabapentin (NEURONTIN) 300 MG capsule Take 1 capsule (300 mg total) by mouth at bedtime.  . iron polysaccharides (NIFEREX) 150 MG capsule Take 150 mg by mouth daily.  Marland Kitchen losartan-hydrochlorothiazide (HYZAAR) 100-25 MG tablet Take 1 tablet by mouth daily.  . pantoprazole (PROTONIX) 40 MG tablet Take 40 mg by mouth daily.  . simvastatin (ZOCOR) 40 MG tablet TAKE 1 TABLET(40 MG) BY MOUTH DAILY AT 6 PM  . VITAMIN D, CHOLECALCIFEROL, PO Take 1 tablet by mouth 2 (two) times a week.     No Known Allergies  Social History   Socioeconomic History  . Marital status: Married    Spouse name: Clair Gulling  . Number of children: 0  . Years of education: 45  . Highest education level: Not on file  Occupational History  . Occupation: retired  Tobacco Use  . Smoking status: Former Smoker    Types: Cigarettes, E-cigarettes    Quit date: 2013    Years since quitting: 8.4  . Smokeless tobacco: Never Used  Substance and Sexual Activity  . Alcohol use: Not Currently  . Drug use: No  . Sexual activity: Not on file  Other Topics Concern  . Not on file  Social History Narrative   Right-handed.   Three cups caffeine daily.   Lives at home with her husband.   Social Determinants of Health   Financial Resource Strain:   . Difficulty of Paying Living Expenses:   Food Insecurity:   . Worried About Charity fundraiser in the Last Year:   . Arboriculturist in the Last Year:   Transportation Needs:   . Film/video editor (Medical):   Marland Kitchen Lack of Transportation (Non-Medical):   Physical Activity:   . Days of Exercise per Week:   . Minutes of Exercise per Session:   Stress:   . Feeling  of Stress :   Social Connections:   . Frequency of Communication with Friends and Family:   . Frequency of Social Gatherings with Friends and Family:   . Attends Religious Services:   . Active Member of Clubs or Organizations:   . Attends Archivist Meetings:   Marland Kitchen Marital Status:   Intimate Partner Violence:   . Fear of Current or Ex-Partner:   . Emotionally Abused:   Marland Kitchen Physically Abused:   . Sexually Abused:      Review of Systems: General: negative for chills, fever, night sweats or weight changes.  Cardiovascular: negative for chest pain, dyspnea on exertion, edema, orthopnea, palpitations, paroxysmal nocturnal dyspnea or shortness of breath Dermatological: negative for rash Respiratory: negative for cough or wheezing Urologic: negative for hematuria Abdominal: negative for nausea, vomiting, diarrhea, bright red blood per rectum, melena, or hematemesis Neurologic: negative for visual changes, syncope, or dizziness All other systems reviewed and are otherwise negative except as noted above.    Blood pressure 136/84, pulse 62, height 5\' 3"  (1.6 m), weight 115 lb 3.2 oz (52.3 kg), SpO2 94 %.  General appearance: alert and no distress Neck: no adenopathy, no JVD, supple, symmetrical, trachea midline, thyroid not enlarged, symmetric, no tenderness/mass/nodules and High-pitched left subclavian artery bruit Lungs: clear to auscultation bilaterally Heart: regular rate and rhythm, S1, S2 normal, no murmur, click, rub or gallop Extremities: extremities normal, atraumatic, no cyanosis or edema Pulses: 2+ and symmetric Skin: Skin color, texture, turgor normal. No rashes or lesions Neurologic: Alert and oriented X 3, normal strength and tone. Normal symmetric reflexes. Normal coordination and gait  EKG sinus rhythm at 62 with incomplete right bundle branch block.  I personally reviewed this EKG.  ASSESSMENT AND PLAN:   Stenosis of left subclavian artery (HCC) History of mild  left subclavian artery stenosis by carotid Doppler study performed 10/12/2019 with only a 8 mm upper extremity blood pressure difference.  Patient is really asymptomatic from this.  Peripheral arterial disease ([place]) History of PAD with lower extremity arterial Doppler studies performed 10/12/2019 revealing a right ABI of 1.03 and a left of 0.98.  She does have moderate left iliac disease.  She says her right side is more symptomatic and she does have peripheral neuropathy which is currently being worked up and treated.  I suspect the majority of her problems are related to her neuropathy rather than vascular obstruction.  We will repeat aortoiliac and lower extremity arterial Doppler studies in 1 year after which I will see her back in follow-up.      Lorretta Harp MD FACP,FACC,FAHA, [facility] 10/26/2019 10:22 AM

## 2019-10-26 NOTE — Assessment & Plan Note (Signed)
History of mild left subclavian artery stenosis by carotid Doppler study performed 10/12/2019 with only a 8 mm upper extremity blood pressure difference.  Patient is really asymptomatic from this.

## 2019-10-26 NOTE — Patient Instructions (Signed)
Medication Instructions:  Your physician recommends that you continue on your current medications as directed. Please refer to the Current Medication list given to you today.  *If you need a refill on your cardiac medications before your next appointment, please call your pharmacy*   Testing/Procedures:  IN 1 YEAR: Your physician has requested that you have a aorta and iliac duplex. During this test, an ultrasound is used to evaluate blood flow to the aorta and iliac arteries. Allow one hour for this exam. Do not eat after midnight the day before and avoid carbonated beverages.   Your physician has requested that you have a lower extremity arterial duplex. During this test, ultrasound is used to evaluate arterial blood flow in the legs. Allow one hour for this exam. There are no restrictions or special instructions.  Your physician has requested that you have an ankle brachial index (ABI). During this test an ultrasound and blood pressure cuff are used to evaluate the arteries that supply the arms and legs with blood. Allow thirty minutes for this exam. There are no restrictions or special instructions.  Your physician has requested that you have a carotid duplex. This test is an ultrasound of the carotid arteries in your neck. It looks at blood flow through these arteries that supply the brain with blood. Allow one hour for this exam. There are no restrictions or special instructions.   Follow-Up: At [facility], you and your health needs are our priority.  As part of our continuing mission to provide you with exceptional heart care, we have created designated Provider Care Teams.  These Care Teams include your primary Cardiologist (physician) and Advanced Practice Providers (APPs -  Physician Assistants and Nurse Practitioners) who all work together to provide you with the care you need, when you need it.  We recommend signing up for the patient portal called "MyChart".  Sign up information  is provided on this After Visit Summary.  MyChart is used to connect with patients for Virtual Visits (Telemedicine).  Patients are able to view lab/test results, encounter notes, upcoming appointments, etc.  Non-urgent messages can be sent to your provider as well.   To learn more about what you can do with MyChart, go to [email or web address].    Your next appointment:   12 month(s)  The format for your next appointment:   In Person  Provider:   You may see Quay Burow, MD or one of the following Advanced Practice Providers on your designated Care Team:    Kerin Ransom, PA-C  [place], [place]  Coletta Memos, [place]    Other Instructions Please call our office 2 months in advance to schedule your follow-up appointment with Dr. Gwenlyn Found.

## 2019-11-19 ENCOUNTER — Other Ambulatory Visit: Payer: Self-pay

## 2019-11-19 ENCOUNTER — Ambulatory Visit (HOSPITAL_COMMUNITY)
Admission: RE | Admit: 2019-11-19 | Discharge: 2019-11-19 | Disposition: A | Discharge status: Home / Self Care | Payer: PPO | Source: Ambulatory Visit | Attending: Internal Medicine | Admitting: Internal Medicine

## 2019-11-19 DIAGNOSIS — G6181 Chronic inflammatory demyelinating polyneuritis: Secondary | ICD-10-CM | POA: Diagnosis not present

## 2019-11-19 MED ORDER — SODIUM CHLORIDE 0.9 % IV SOLN
INTRAVENOUS | Status: DC | PRN
  Administered 2019-11-19: 250 mL via INTRAVENOUS

## 2019-11-19 MED ORDER — SODIUM CHLORIDE 0.9 % IV SOLN
1000.0000 mg | INTRAVENOUS | Status: DC
  Administered 2019-11-19: 1000 mg via INTRAVENOUS
  Filled 2019-11-19: qty 8

## 2019-11-19 NOTE — Discharge Instructions (Signed)
Methylprednisolone Solution for Injection What is this medicine? METHYLPREDNISOLONE (meth ill pred NISS oh lone) is a corticosteroid. It is commonly used to treat inflammation of the skin, joints, lungs, and other organs. Common conditions treated include asthma, allergies, and arthritis. It is also used for other conditions, such as blood disorders and diseases of the adrenal glands. This medicine may be used for other purposes; ask your health care provider or pharmacist if you have questions. COMMON BRAND NAME(S): A-Methapred, Solu-Medrol What should I tell my health care provider before I take this medicine? They need to know if you have any of these conditions:  Cushing's syndrome  eye disease, vision problems  diabetes  glaucoma  heart disease  high blood pressure  infection (especially a virus infection such as chickenpox, cold sores, or herpes)  liver disease  mental illness  myasthenia gravis  osteoporosis  recently received or scheduled to receive a vaccine  seizures  stomach or intestine problems  thyroid disease  an unusual or allergic reaction to lactose, methylprednisolone, other medicines, foods, dyes, or preservatives  pregnant or trying to get pregnant  breast-feeding How should I use this medicine? This medicine is for injection or infusion into a vein. It is also for injection into a muscle. It is given by a health care professional in a hospital or clinic setting. Talk to your pediatrician regarding the use of this medicine in children. While this drug may be prescribed for selected conditions, precautions do apply. Overdosage: If you think you have taken too much of this medicine contact a poison control center or emergency room at once. NOTE: This medicine is only for you. Do not share this medicine with others. What if I miss a dose? This does not apply. What may interact with this medicine? Do not take this medicine with any of the following  medications:  alefacept  echinacea  iopamidol  live virus vaccines  metyrapone  mifepristone This medicine may also interact with the following medications:  amphotericin B  aspirin and aspirin-like medicines  certain antibiotics like erythromycin, clarithromycin, troleandomycin  certain medicines for diabetes  certain medicines for fungal infection like ketoconazole  certain medicines for seizures like carbamazepine, phenobarbital, phenytoin  certain medicines that treat or prevent blood clots like warfarin  cyclosporine  digoxin  diuretics  female hormones, like estrogens and birth control pills  isoniazid  NSAIDS, medicines for pain and inflammation, like ibuprofen or naproxen  other medicines for myasthenia gravis  rifampin  vaccines This list may not describe all possible interactions. Give your health care provider a list of all the medicines, herbs, non-prescription drugs, or dietary supplements you use. Also tell them if you smoke, drink alcohol, or use illegal drugs. Some items may interact with your medicine. What should I watch for while using this medicine? Tell your doctor or healthcare professional if your symptoms do not start to get better or if they get worse. Do not stop taking except on your doctor's advice. You may develop a severe reaction. Your doctor will tell you how much medicine to take. Your condition will be monitored carefully while you are receiving this medicine. This medicine may increase your risk of getting an infection. Tell your doctor or health care professional if you are around anyone with measles or chickenpox, or if you develop sores or blisters that do not heal properly. This medicine may increase blood sugar. Ask your healthcare provider if changes in diet or medicines are needed if you have diabetes. Tell  your doctor or health care professional right away if you have any change in your eyesight. Using this medicine for a  long time may increase your risk of low bone mass. Talk to your doctor about bone health. What side effects may I notice from receiving this medicine? Side effects that you should report to your doctor or health care professional as soon as possible:  allergic reactions like skin rash, itching or hives, swelling of the face, lips, or tongue  bloody or tarry stools  hallucination, loss of contact with reality  muscle cramps  muscle pain  palpitations  signs and symptoms of high blood sugar such as being more thirsty or hungry or having to urinate more than normal. You may also feel very tired or have blurry vision.  signs and symptoms of infection like fever or chills; cough; sore throat; pain or trouble passing urine  trouble passing urine Side effects that usually do not require medical attention (report to your doctor or health care professional if they continue or are bothersome):  changes in emotions or mood  constipation  diarrhea  excessive hair growth on the face or body  headache  nausea, vomiting  pain, redness, or irritation at site where injected  trouble sleeping  weight gain This list may not describe all possible side effects. Call your doctor for medical advice about side effects. You may report side effects to FDA at 1-800-FDA-1088. Where should I keep my medicine? This drug is given in a hospital or clinic and will not be stored at home. NOTE: This sheet is a summary. It may not cover all possible information. If you have questions about this medicine, talk to your doctor, pharmacist, or health care provider.  2020 Elsevier/Gold Standard (2018-02-19 09:12:19)

## 2019-11-19 NOTE — Progress Notes (Signed)
PATIENT CARE CENTER NOTE  Diagnosis: CIPD   Provider: Narda Amber, MD   Procedure: Solumedrol infusion    Note: Patient received Solumedrol infusion via PIV. This was patient's 8th of 10 infusions. Patient tolerated well with no adverse reaction. Vital signs stable.  Discharge instructions given. Patient to come back in 28 days for 9th infusion. Alert, oriented and ambulatory at discharge

## 2019-12-20 ENCOUNTER — Ambulatory Visit (HOSPITAL_COMMUNITY)
Admission: RE | Admit: 2019-12-20 | Discharge: 2019-12-20 | Disposition: A | Discharge status: Home / Self Care | Payer: PPO | Source: Ambulatory Visit | Attending: Internal Medicine | Admitting: Internal Medicine

## 2019-12-20 ENCOUNTER — Other Ambulatory Visit: Payer: Self-pay

## 2019-12-20 DIAGNOSIS — G6181 Chronic inflammatory demyelinating polyneuritis: Secondary | ICD-10-CM | POA: Diagnosis not present

## 2019-12-20 MED ORDER — SODIUM CHLORIDE 0.9 % IV SOLN
1000.0000 mg | INTRAVENOUS | Status: DC
  Administered 2019-12-20: 1000 mg via INTRAVENOUS
  Filled 2019-12-20: qty 8

## 2019-12-20 MED ORDER — SODIUM CHLORIDE 0.9 % IV SOLN
INTRAVENOUS | Status: DC | PRN
  Administered 2019-12-20: 250 mL via INTRAVENOUS

## 2019-12-20 NOTE — Progress Notes (Signed)
PATIENT CARE CENTER NOTE  Diagnosis:CIPD   Provider:Patel, Donika, MD   Procedure:Solumedrol infusion   Note:Patient received Solumedrol infusion via PIV. This was patient's 9th of 10 infusions. Patient tolerated well with no adverse reaction. Vital signs stable.  Discharge instructions given. Patient to come back in 28 days for 10th and final infusion. Alert, oriented and ambulatory at discharge

## 2019-12-20 NOTE — Discharge Instructions (Signed)
Methylprednisolone Solution for Injection What is this medicine? METHYLPREDNISOLONE (meth ill pred NISS oh lone) is a corticosteroid. It is commonly used to treat inflammation of the skin, joints, lungs, and other organs. Common conditions treated include asthma, allergies, and arthritis. It is also used for other conditions, such as blood disorders and diseases of the adrenal glands. This medicine may be used for other purposes; ask your health care provider or pharmacist if you have questions. COMMON BRAND NAME(S): A-Methapred, Solu-Medrol What should I tell my health care provider before I take this medicine? They need to know if you have any of these conditions:  Cushing's syndrome  eye disease, vision problems  diabetes  glaucoma  heart disease  high blood pressure  infection (especially a virus infection such as chickenpox, cold sores, or herpes)  liver disease  mental illness  myasthenia gravis  osteoporosis  recently received or scheduled to receive a vaccine  seizures  stomach or intestine problems  thyroid disease  an unusual or allergic reaction to lactose, methylprednisolone, other medicines, foods, dyes, or preservatives  pregnant or trying to get pregnant  breast-feeding How should I use this medicine? This medicine is for injection or infusion into a vein. It is also for injection into a muscle. It is given by a health care professional in a hospital or clinic setting. Talk to your pediatrician regarding the use of this medicine in children. While this drug may be prescribed for selected conditions, precautions do apply. Overdosage: If you think you have taken too much of this medicine contact a poison control center or emergency room at once. NOTE: This medicine is only for you. Do not share this medicine with others. What if I miss a dose? This does not apply. What may interact with this medicine? Do not take this medicine with any of the following  medications:  alefacept  echinacea  iopamidol  live virus vaccines  metyrapone  mifepristone This medicine may also interact with the following medications:  amphotericin B  aspirin and aspirin-like medicines  certain antibiotics like erythromycin, clarithromycin, troleandomycin  certain medicines for diabetes  certain medicines for fungal infection like ketoconazole  certain medicines for seizures like carbamazepine, phenobarbital, phenytoin  certain medicines that treat or prevent blood clots like warfarin  cyclosporine  digoxin  diuretics  female hormones, like estrogens and birth control pills  isoniazid  NSAIDS, medicines for pain and inflammation, like ibuprofen or naproxen  other medicines for myasthenia gravis  rifampin  vaccines This list may not describe all possible interactions. Give your health care provider a list of all the medicines, herbs, non-prescription drugs, or dietary supplements you use. Also tell them if you smoke, drink alcohol, or use illegal drugs. Some items may interact with your medicine. What should I watch for while using this medicine? Tell your doctor or healthcare professional if your symptoms do not start to get better or if they get worse. Do not stop taking except on your doctor's advice. You may develop a severe reaction. Your doctor will tell you how much medicine to take. Your condition will be monitored carefully while you are receiving this medicine. This medicine may increase your risk of getting an infection. Tell your doctor or health care professional if you are around anyone with measles or chickenpox, or if you develop sores or blisters that do not heal properly. This medicine may increase blood sugar. Ask your healthcare provider if changes in diet or medicines are needed if you have diabetes. Tell  your doctor or health care professional right away if you have any change in your eyesight. Using this medicine for a  long time may increase your risk of low bone mass. Talk to your doctor about bone health. What side effects may I notice from receiving this medicine? Side effects that you should report to your doctor or health care professional as soon as possible:  allergic reactions like skin rash, itching or hives, swelling of the face, lips, or tongue  bloody or tarry stools  hallucination, loss of contact with reality  muscle cramps  muscle pain  palpitations  signs and symptoms of high blood sugar such as being more thirsty or hungry or having to urinate more than normal. You may also feel very tired or have blurry vision.  signs and symptoms of infection like fever or chills; cough; sore throat; pain or trouble passing urine  trouble passing urine Side effects that usually do not require medical attention (report to your doctor or health care professional if they continue or are bothersome):  changes in emotions or mood  constipation  diarrhea  excessive hair growth on the face or body  headache  nausea, vomiting  pain, redness, or irritation at site where injected  trouble sleeping  weight gain This list may not describe all possible side effects. Call your doctor for medical advice about side effects. You may report side effects to FDA at 1-800-FDA-1088. Where should I keep my medicine? This drug is given in a hospital or clinic and will not be stored at home. NOTE: This sheet is a summary. It may not cover all possible information. If you have questions about this medicine, talk to your doctor, pharmacist, or health care provider.  2020 Elsevier/Gold Standard (2018-02-19 09:12:19)

## 2020-01-10 ENCOUNTER — Other Ambulatory Visit: Payer: Self-pay | Admitting: Cardiovascular Disease

## 2020-01-17 ENCOUNTER — Ambulatory Visit: Payer: PPO | Admitting: Neurology

## 2020-01-17 ENCOUNTER — Other Ambulatory Visit: Payer: Self-pay

## 2020-01-17 ENCOUNTER — Encounter: Payer: Self-pay | Admitting: Neurology

## 2020-01-17 VITALS — BP 158/91 | HR 74 | Resp 18 | Ht 63.0 in | Wt 113.0 lb

## 2020-01-17 DIAGNOSIS — G629 Polyneuropathy, unspecified: Secondary | ICD-10-CM

## 2020-01-17 MED ORDER — GABAPENTIN 300 MG PO CAPS: 300.0000 mg | ORAL_CAPSULE | Freq: Two times a day (BID) | ORAL | 1 refills | Status: AC

## 2020-01-17 NOTE — Patient Instructions (Addendum)
Increase gabapentin to 300mg  twice daily Stop steroid infusions We will refer you to [facility] Return to clinic in 6 months

## 2020-01-17 NOTE — Progress Notes (Signed)
Follow-up Visit   Date: 01/17/20   [name] MRN: [id number] DOB: 12-11-1950   Interim History: [name] is a 69 y.o. Caucasian female returning to the clinic for follow-up of sensory neuropathy.  The patient was accompanied to the clinic by husband who also provides collateral information.    History of present illness: Starting in June 2019, she began having numbness over the right hand and arm which then progressed into both arms and legs over the next few weeks. She has sensation over the chest and face. She has some numbness in her groin. Symptoms are constant and worse on the right side. She has generalized weakness, reports dropping objects. She has imbalance and has fallen three times in December. She walks unassisted and has not done physical therapy.   Prior work-up by Dr. Krista Blue has been extensive and including imaging of the cervical, thoracic, and lumbar spine which shows mild degenerative changes only. NCS/EMG showed absent sensory responses diffusely. Serology testing for autoimmune, infectious, metabolic, nutritional deficiency, and paraneoplastic disease was negative.   UPDATE 01/17/2020:  In February 2021, she had lumbar puncture which showed borderline elevation in protein (62, normal < 60), with nonspecific elevation in IgG protein and 5 OCB both in CSF and serum.  Because of these soft findings, I discussed offering a trial of corticosteroids to see if there was any immune-mediated pathology. She has completed almost 52-month of IVMP and denies any improvement, in fact, there may be slight worsening of numbness because it involves her thigh and groin. No new weakness or falls.   Medications:  Current Outpatient Medications on File Prior to Visit  Medication Sig Dispense Refill  . carvedilol (COREG) 6.25 MG tablet TAKE 1 TABLET(6.25 MG) BY MOUTH TWICE DAILY 180 tablet 2  . clopidogrel (PLAVIX) 75 MG tablet TAKE 1 TABLET BY MOUTH EVERY DAY 30 tablet 5    . docusate sodium (EQUATE STOOL SOFTENER) 100 MG capsule Take 100 mg by mouth 2 (two) times daily.    .Marland Kitchengabapentin (NEURONTIN) 300 MG capsule Take 1 capsule (300 mg total) by mouth at bedtime. 30 capsule 5  . iron polysaccharides (NIFEREX) 150 MG capsule Take 150 mg by mouth daily.    .Marland Kitchenlosartan-hydrochlorothiazide (HYZAAR) 100-25 MG tablet Take 1 tablet by mouth daily. 90 tablet 3  . pantoprazole (PROTONIX) 20 MG tablet Take 20 mg by mouth daily.    . simvastatin (ZOCOR) 40 MG tablet TAKE 1 TABLET(40 MG) BY MOUTH DAILY AT 6 PM 90 tablet 2  . VITAMIN D, CHOLECALCIFEROL, PO Take 1 tablet by mouth 2 (two) times a week.    . pantoprazole (PROTONIX) 40 MG tablet Take 40 mg by mouth daily.     No current facility-administered medications on file prior to visit.    Allergies: No Known Allergies  Vital Signs:  BP (!) 158/91   Pulse 74   Resp 18   Ht '5\' 3"'  (1.6 m)   Wt 113 lb (51.3 kg)   SpO2 96%   BMI 20.02 kg/m   Neurological Exam: MENTAL STATUS including orientation to time, place, person, recent and remote memory, attention span and concentration, language, and fund of knowledge is normal.  Speech is not dysarthric.  CRANIAL NERVES:  No visual field defects.  Pupils equal round and reactive to light.  Normal conjugate, extra-ocular eye movements in all directions of gaze.  No ptosis.  Face is symmetric.   MOTOR:  Motor strength is 5/5 in all extremities,  including distally in the hands and feet.  No atrophy, fasciculations or abnormal movements.  No pronator drift.  Tone is normal.    MSRs:  Reflexes are 2+/4 in the arms and absent in the legs.  SENSORY: Trace vibration at the knees, intact at the MCP, absent below ankles.  Rhomberg sign is present.   COORDINATION/GAIT:  Normal finger-to- nose-finger.  Intact rapid alternating movements bilaterally.  Gait appears mildly unsteady, unassisted.    Data: CSF 07/20/2019:  R1  W0  P62*  G60  IgG index 0.6, OCB >5 in serum and CSF, MPB  neg, ACE neg, cytology neg  Labs 08/10/2018:  Paraneoplastic antibody neg, vitamin B5 24, Lyme neg, vitamin B12 341, RPR neg, HIV neg, B1 105,ACE neg, ANA neg, hepatitis panel neg, copper 127, ESR 31, CK 129  NCS/EMG of the right arm and leg 08/10/2018 performed at [facility]: This is an abnormal study. There is electrodiagnostic evidence of absent sensory responses on all the sensory nervestested, suggestive of sensory neuronopathy,  MRI lumbar spine wo contrast 08/16/2018:  Mild abnormal MRI scan lumbar spine showing mild facet and disc degenerative changes resulting in mild foraminal narrowing left greater than right L3-4 and L4-5 and L5-S1 but without definite compression.  MRI thoracic spine wo contrast 08/16/2018: Unremarkable MRI scan of thoracic spine showing only minor disc signal abnormalities. No frank disc herniation, compression or foraminal encroachment is noted  MRI cervical spine wo contrast 07/26/2018: This MRI of the cervical spine without contrast shows the following: 1. The spinal cord appears normal. 2. Mild multilevel degenerative changes as detailed above that do not lead to spinal stenosis or nerve root compression. 3. 6 mm cyst in the right hemithyroid. 4. Mild ventricular enlargement noted on the sagittal images.   IMPRESSION/PLAN: Sensory neuropathy - etiology unknown.  Extensive evaluation has been negative.  No improvement with a trial of Solumedrol for 6 months for possible CISP  - Discontinue IV steroids given no benefit  - Continue gabapentin 353m   - Referral to academic center for second opinion  - Patient educated on daily foot inspection, fall prevention, and safety precautions around the home.  Return to clinic in 6 months  Thank you for allowing me to participate in patient's care.  If I can answer any additional questions, I would be pleased to do so.    Sincerely,    Perrion Diesel K. PPosey Pronto DO

## 2020-01-18 ENCOUNTER — Encounter (HOSPITAL_COMMUNITY): Payer: PPO

## 2020-01-19 NOTE — Progress Notes (Signed)
Date:  01/19/2020   ID:  [name], DOB Jan 12, 1951, MRN [id number]  PCP:  Reynold Bowen, MD  Cardiologist:   Johnsie Cancel Electrophysiologist:  None   Evaluation Performed:  Follow-Up Visit  Chief Complaint:  CAD/PVD   History of Present Illness:    69 y.o. . anterior wall MI 1998 Rx with BMS to LAD . Edge restenosis Rx re intervention 2008 Quit smoking since then. Echo done 08/16/13 with EF 55-60% mild MR Has had anemia with polypectomy by Dr Henrene Pastor 2017 and again September 2018. BP tends to run higher in office than at home    No chest pain compliant with meds BP readings home normal white coat in office   Having neuropathic symptoms in legs and especially arms RUE worse  Started with "pinched" nerve Seen by Dr Krista Blue neurology labs ok only Vit D low Symptoms not helped by Vitamin B shots  MRI no cervical root impingement   Seen by Dr Gwenlyn Found 04/26/19 with moderate PVD ABI normal on right and 0.87 on left Also with left subclavian stenosis ant atypical left vertebral flow Peak velocity in left subclavian 3.78 m/sec.  Both being followed with no intervention  Duplex 10/12/19 only 8 mmHg UE BP disparity  ABI 10/12/19 ABI right 1.03 and left improved 0.98   No angina   Significant neuropathy followed by Dr Krista Blue. EMG with absent responses and cervical and lumbar spine MRI ok Serology testing for autoimmune, infectious, metabolic, nutritional deficiency, and paraneoplastic disease was negative.Did not respond to trial of iv steroids Rx Gabapentin referred to academic center   No cardiac issues still waiting for appointment to [facility]   The patient  does not have symptoms concerning for COVID-19 infection (fever, chills, cough, or new shortness of breath).    Past Medical History:  Diagnosis Date  . ABDOMINAL AORTIC ANEURYSM   . Anemia   . Arthritis    HANDS  . Blood transfusion without reported diagnosis    AUGUST 5TH 2017 x2   . CAD   . CONGESTIVE HEART FAILURE, SYSTOLIC,  CHRONIC   . CT, CHEST, ABNORMAL   . GERD (gastroesophageal reflux disease)   . Heart attack ([place])    x 2  . History of colon polyps 01/05/2014   adenomatous polyps  . HYPERLIPIDEMIA   . HYPERTENSION   . ISCHEMIC CARDIOMYOPATHY   . Lower GI bleed 01/2014  . 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 Numbness and tingling   . 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 Paresthesia of both hands   . TOBACCO ABUSE    Past Surgical History:  Procedure Laterality Date  . bare metal stent placement     left anterior descending artery x 2  . COLONOSCOPY N/A 01/05/2014   Procedure: COLONOSCOPY;  Surgeon: Jerene Bears, MD;  Location: [facility];  Service: Endoscopy;  Laterality: N/A;  . COLONOSCOPY N/A 01/06/2014   Procedure: COLONOSCOPY;  Surgeon: Jerene Bears, MD;  Location: [facility];  Service: Endoscopy;  Laterality: N/A;  . COLONOSCOPY    . POLYPECTOMY       No outpatient medications have been marked as taking for the 02/02/20 encounter (Appointment) with Josue Hector, MD.     Allergies:   Patient has no known allergies.   Social History   Tobacco Use  . Smoking status: Former Smoker    Types: Cigarettes, E-cigarettes    Quit date: 2013    Years since quitting:  8.6  . Smokeless tobacco: Never Used  Vaping Use  . Vaping Use: Some days  Substance Use Topics  . Alcohol use: Not Currently  . Drug use: No     Family Hx: The patient's family history includes Anemia in her sister; Colon polyps in her father and sister; Depression (age of onset: 54) in her father; Diabetes in her maternal grandmother; Heart attack in her father; Lung cancer in her sister; Lymphoma (age of onset: 23) in her mother. There is no history of Colon cancer, Esophageal cancer, Rectal cancer, or Stomach cancer.  ROS:   Please see the history of present illness.     All other systems reviewed and are negative.   Prior CV studies:   The following studies were reviewed  today:  Carotid:  03/08/19 LE ABI 03/26/19   Labs/Other Tests and Data Reviewed:    EKG:  NSR rate 68 normal ECG 08/11/17  Recent Labs: No results found for requested labs within last 8760 hours.   Recent Lipid Panel Lab Results  Component Value Date/Time   CHOL 161 07/28/2013 12:18 PM   TRIG 78.0 07/28/2013 12:18 PM   HDL 65.30 07/28/2013 12:18 PM   CHOLHDL 2 07/28/2013 12:18 PM   LDLCALC 80 07/28/2013 12:18 PM    Wt Readings from Last 3 Encounters:  01/17/20 113 lb (51.3 kg)  10/26/19 115 lb 3.2 oz (52.3 kg)  08/04/19 117 lb (53.1 kg)     Objective:    Vital Signs:  There were no vitals taken for this visit.   Affect appropriate Chronically ill white female  HEENT: normal Neck supple with no adenopathy JVP normal right bruits no thyromegaly Lungs clear with no wheezing and good diaphragmatic motion Heart:  S1/S2 no murmur, no rub, gallop or click PMI normal Abdomen: benighn, BS positve, no tenderness, no AAA no bruit.  No HSM or HJR Distal pulses intact with no bruits No edema Neuro non-focal sensory deficits arms/legs  Skin warm and dry No muscular weakness   ASSESSMENT & PLAN:    CAD: Stable with no angina and good activity level.  Continue medical Rx New nitro called in LAD stent in 20-Sep-1996 with re intervention 21-Sep-2006 low dose beta blocker due to bradycardia   HTN: Well controlled.  Continue current medications and low sodium Dash type diet.   Home readings normal white coat component   HLD:  On statin labs with primary   Anemia:  On iron improved no bleeding  02/19/16 EGD normal colonoscopy with removal of 8 polyps Dr Henrene Pastor did repeat Colonoscopy 02/13/18 and removed 2 more polyps   PVD: f/u Dr Gwenlyn Found moderate LLE dx and left subclavian stenosis without arm weakness   Depression: Sister died 09-20-16  husband with DCM and renal cell cancer PRN valium   Neuropathy:  F/u neurology lab work and MRI's unrevealing to date Failed trial of iv steroids referred to  tertiary center    COVID-19 Education: The signs and symptoms of COVID-19 were discussed with the patient and how to seek care for testing (follow up with PCP or arrange E-visit).  The importance of social distancing was discussed today.   Medication Adjustments/Labs and Tests Ordered: Current medicines are reviewed at length with the patient today.  Concerns regarding medicines are outlined above.   Tests Ordered:  Carotid and LE ABI's 03/2020   Medication Changes:  None   Disposition:  Follow up with Dr Gwenlyn Found in 6 months and me in a year  Signed, Jenkins Rouge, MD  01/19/2020 3:07 PM    [place]

## 2020-02-02 ENCOUNTER — Ambulatory Visit: Payer: PPO | Admitting: Cardiovascular Disease

## 2020-02-02 ENCOUNTER — Encounter: Payer: Self-pay | Admitting: Cardiovascular Disease

## 2020-02-02 ENCOUNTER — Other Ambulatory Visit: Payer: Self-pay

## 2020-02-02 VITALS — BP 130/80 | HR 72 | Ht 63.0 in | Wt 113.0 lb

## 2020-02-02 DIAGNOSIS — G609 Hereditary and idiopathic neuropathy, unspecified: Secondary | ICD-10-CM | POA: Diagnosis not present

## 2020-02-02 DIAGNOSIS — D649 Anemia, unspecified: Secondary | ICD-10-CM | POA: Diagnosis not present

## 2020-02-02 DIAGNOSIS — D510 Vitamin B12 deficiency anemia due to intrinsic factor deficiency: Secondary | ICD-10-CM | POA: Diagnosis not present

## 2020-02-02 DIAGNOSIS — I714 Abdominal aortic aneurysm, without rupture: Secondary | ICD-10-CM | POA: Diagnosis not present

## 2020-02-02 DIAGNOSIS — I6523 Occlusion and stenosis of bilateral carotid arteries: Secondary | ICD-10-CM | POA: Diagnosis not present

## 2020-02-02 DIAGNOSIS — G119 Hereditary ataxia, unspecified: Secondary | ICD-10-CM | POA: Diagnosis not present

## 2020-02-02 DIAGNOSIS — I739 Peripheral vascular disease, unspecified: Secondary | ICD-10-CM

## 2020-02-02 DIAGNOSIS — D126 Benign neoplasm of colon, unspecified: Secondary | ICD-10-CM | POA: Diagnosis not present

## 2020-02-02 DIAGNOSIS — I700 Atherosclerosis of aorta: Secondary | ICD-10-CM | POA: Diagnosis not present

## 2020-02-02 DIAGNOSIS — I2510 Atherosclerotic heart disease of native coronary artery without angina pectoris: Secondary | ICD-10-CM

## 2020-02-02 DIAGNOSIS — E785 Hyperlipidemia, unspecified: Secondary | ICD-10-CM | POA: Diagnosis not present

## 2020-02-02 DIAGNOSIS — I10 Essential (primary) hypertension: Secondary | ICD-10-CM | POA: Diagnosis not present

## 2020-02-02 NOTE — Patient Instructions (Signed)
Medication Instructions:  *If you need a refill on your cardiac medications before your next appointment, please call your pharmacy*  Lab Work: If you have labs (blood work) drawn today and your tests are completely normal, you will receive your results only by: Marland Kitchen MyChart Message (if you have MyChart) OR . A paper copy in the mail If you have any lab test that is abnormal or we need to change your treatment, we will call you to review the results.   Testing/Procedures: Your physician has requested that you have a carotid duplex. This test is an ultrasound of the carotid arteries in your neck. It looks at blood flow through these arteries that supply the brain with blood. Allow one hour for this exam. There are no restrictions or special instructions.  Your physician has requested that you have a lower extremity arterial duplex with ABI. This test is an ultrasound of the arteries in the legs. It looks at arterial blood flow in the legs. Allow one hour for Lower  Arterial scans. There are no restrictions or special instructions  Follow-Up: At [facility], you and your health needs are our priority.  As part of our continuing mission to provide you with exceptional heart care, we have created designated Provider Care Teams.  These Care Teams include your primary Cardiologist (physician) and Advanced Practice Providers (APPs -  Physician Assistants and Nurse Practitioners) who all work together to provide you with the care you need, when you need it.  We recommend signing up for the patient portal called "MyChart".  Sign up information is provided on this After Visit Summary.  MyChart is used to connect with patients for Virtual Visits (Telemedicine).  Patients are able to view lab/test results, encounter notes, upcoming appointments, etc.  Non-urgent messages can be sent to your provider as well.   To learn more about what you can do with MyChart, go to [email or web address].    Your next  appointment:   6 month(s)  The format for your next appointment:   In Person  Provider:   You may see Dr. Johnsie Cancel

## 2020-02-04 ENCOUNTER — Other Ambulatory Visit (HOSPITAL_COMMUNITY): Payer: Self-pay | Admitting: Cardiovascular Disease

## 2020-02-04 DIAGNOSIS — M79604 Pain in right leg: Secondary | ICD-10-CM

## 2020-03-01 ENCOUNTER — Ambulatory Visit (HOSPITAL_BASED_OUTPATIENT_CLINIC_OR_DEPARTMENT_OTHER)
Admission: RE | Admit: 2020-03-01 | Discharge: 2020-03-01 | Disposition: A | Discharge status: Home / Self Care | Payer: PPO | Source: Ambulatory Visit | Attending: Cardiovascular Disease | Admitting: Cardiovascular Disease

## 2020-03-01 ENCOUNTER — Other Ambulatory Visit: Payer: Self-pay

## 2020-03-01 ENCOUNTER — Ambulatory Visit (HOSPITAL_COMMUNITY)
Admission: RE | Admit: 2020-03-01 | Discharge: 2020-03-01 | Disposition: A | Discharge status: Home / Self Care | Payer: PPO | Source: Ambulatory Visit | Attending: Cardiovascular Disease | Admitting: Cardiovascular Disease

## 2020-03-01 ENCOUNTER — Other Ambulatory Visit: Payer: Self-pay | Admitting: Cardiovascular Disease

## 2020-03-01 ENCOUNTER — Other Ambulatory Visit (HOSPITAL_COMMUNITY): Payer: Self-pay | Admitting: Cardiovascular Disease

## 2020-03-01 DIAGNOSIS — I2510 Atherosclerotic heart disease of native coronary artery without angina pectoris: Secondary | ICD-10-CM

## 2020-03-01 DIAGNOSIS — I739 Peripheral vascular disease, unspecified: Secondary | ICD-10-CM

## 2020-03-01 DIAGNOSIS — M79605 Pain in left leg: Secondary | ICD-10-CM | POA: Diagnosis not present

## 2020-03-01 DIAGNOSIS — M79604 Pain in right leg: Secondary | ICD-10-CM | POA: Diagnosis not present

## 2020-04-21 DIAGNOSIS — G549 Nerve root and plexus disorder, unspecified: Secondary | ICD-10-CM | POA: Diagnosis not present

## 2020-04-21 DIAGNOSIS — R200 Anesthesia of skin: Secondary | ICD-10-CM | POA: Diagnosis not present

## 2020-04-21 DIAGNOSIS — R531 Weakness: Secondary | ICD-10-CM | POA: Diagnosis not present

## 2020-05-01 DIAGNOSIS — I2510 Atherosclerotic heart disease of native coronary artery without angina pectoris: Secondary | ICD-10-CM | POA: Diagnosis not present

## 2020-05-01 DIAGNOSIS — K7689 Other specified diseases of liver: Secondary | ICD-10-CM | POA: Diagnosis not present

## 2020-05-01 DIAGNOSIS — I714 Abdominal aortic aneurysm, without rupture: Secondary | ICD-10-CM | POA: Diagnosis not present

## 2020-05-01 DIAGNOSIS — J432 Centrilobular emphysema: Secondary | ICD-10-CM | POA: Diagnosis not present

## 2020-05-19 DIAGNOSIS — R920 Mammographic microcalcification found on diagnostic imaging of breast: Secondary | ICD-10-CM | POA: Diagnosis not present

## 2020-05-19 DIAGNOSIS — C773 Secondary and unspecified malignant neoplasm of axilla and upper limb lymph nodes: Secondary | ICD-10-CM | POA: Diagnosis not present

## 2020-05-19 DIAGNOSIS — N6332 Unspecified lump in axillary tail of the left breast: Secondary | ICD-10-CM | POA: Diagnosis not present

## 2020-05-19 DIAGNOSIS — R921 Mammographic calcification found on diagnostic imaging of breast: Secondary | ICD-10-CM | POA: Diagnosis not present

## 2020-05-19 DIAGNOSIS — R928 Other abnormal and inconclusive findings on diagnostic imaging of breast: Secondary | ICD-10-CM | POA: Diagnosis not present

## 2020-05-19 DIAGNOSIS — R590 Localized enlarged lymph nodes: Secondary | ICD-10-CM | POA: Diagnosis not present

## 2020-05-19 DIAGNOSIS — C50412 Malignant neoplasm of upper-outer quadrant of left female breast: Secondary | ICD-10-CM | POA: Diagnosis not present

## 2020-05-19 DIAGNOSIS — N6321 Unspecified lump in the left breast, upper outer quadrant: Secondary | ICD-10-CM | POA: Diagnosis not present

## 2020-05-19 DIAGNOSIS — Z170 Estrogen receptor positive status [ER+]: Secondary | ICD-10-CM | POA: Diagnosis not present

## 2020-06-03 DIAGNOSIS — C50919 Malignant neoplasm of unspecified site of unspecified female breast: Secondary | ICD-10-CM

## 2020-06-03 HISTORY — DX: Malignant neoplasm of unspecified site of unspecified female breast: C50.919

## 2020-06-06 DIAGNOSIS — C50912 Malignant neoplasm of unspecified site of left female breast: Secondary | ICD-10-CM | POA: Diagnosis not present

## 2020-06-06 DIAGNOSIS — I2510 Atherosclerotic heart disease of native coronary artery without angina pectoris: Secondary | ICD-10-CM | POA: Diagnosis not present

## 2020-06-06 DIAGNOSIS — G629 Polyneuropathy, unspecified: Secondary | ICD-10-CM | POA: Diagnosis not present

## 2020-06-06 DIAGNOSIS — I252 Old myocardial infarction: Secondary | ICD-10-CM | POA: Diagnosis not present

## 2020-06-06 DIAGNOSIS — C50412 Malignant neoplasm of upper-outer quadrant of left female breast: Secondary | ICD-10-CM | POA: Diagnosis not present

## 2020-06-06 DIAGNOSIS — Z170 Estrogen receptor positive status [ER+]: Secondary | ICD-10-CM | POA: Diagnosis not present

## 2020-06-06 DIAGNOSIS — Z803 Family history of malignant neoplasm of breast: Secondary | ICD-10-CM | POA: Diagnosis not present

## 2020-06-06 DIAGNOSIS — Z79899 Other long term (current) drug therapy: Secondary | ICD-10-CM | POA: Diagnosis not present

## 2020-06-06 DIAGNOSIS — Z7902 Long term (current) use of antithrombotics/antiplatelets: Secondary | ICD-10-CM | POA: Diagnosis not present

## 2020-06-07 DIAGNOSIS — I083 Combined rheumatic disorders of mitral, aortic and tricuspid valves: Secondary | ICD-10-CM | POA: Diagnosis not present

## 2020-06-07 DIAGNOSIS — I080 Rheumatic disorders of both mitral and aortic valves: Secondary | ICD-10-CM | POA: Diagnosis not present

## 2020-06-08 DIAGNOSIS — G9389 Other specified disorders of brain: Secondary | ICD-10-CM | POA: Diagnosis not present

## 2020-06-08 DIAGNOSIS — C50912 Malignant neoplasm of unspecified site of left female breast: Secondary | ICD-10-CM | POA: Diagnosis not present

## 2020-06-09 DIAGNOSIS — C773 Secondary and unspecified malignant neoplasm of axilla and upper limb lymph nodes: Secondary | ICD-10-CM | POA: Diagnosis not present

## 2020-06-09 DIAGNOSIS — C50412 Malignant neoplasm of upper-outer quadrant of left female breast: Secondary | ICD-10-CM | POA: Diagnosis not present

## 2020-06-09 DIAGNOSIS — Z170 Estrogen receptor positive status [ER+]: Secondary | ICD-10-CM | POA: Diagnosis not present

## 2020-06-09 DIAGNOSIS — I10 Essential (primary) hypertension: Secondary | ICD-10-CM | POA: Diagnosis not present

## 2020-06-09 DIAGNOSIS — Z01818 Encounter for other preprocedural examination: Secondary | ICD-10-CM | POA: Diagnosis not present

## 2020-06-09 DIAGNOSIS — Z5181 Encounter for therapeutic drug level monitoring: Secondary | ICD-10-CM | POA: Diagnosis not present

## 2020-06-09 DIAGNOSIS — Z955 Presence of coronary angioplasty implant and graft: Secondary | ICD-10-CM | POA: Diagnosis not present

## 2020-06-09 DIAGNOSIS — R948 Abnormal results of function studies of other organs and systems: Secondary | ICD-10-CM | POA: Diagnosis not present

## 2020-06-09 DIAGNOSIS — Z87891 Personal history of nicotine dependence: Secondary | ICD-10-CM | POA: Diagnosis not present

## 2020-06-09 DIAGNOSIS — Z79899 Other long term (current) drug therapy: Secondary | ICD-10-CM | POA: Diagnosis not present

## 2020-06-09 DIAGNOSIS — Z7902 Long term (current) use of antithrombotics/antiplatelets: Secondary | ICD-10-CM | POA: Diagnosis not present

## 2020-06-09 DIAGNOSIS — I2510 Atherosclerotic heart disease of native coronary artery without angina pectoris: Secondary | ICD-10-CM | POA: Diagnosis not present

## 2020-06-09 DIAGNOSIS — I252 Old myocardial infarction: Secondary | ICD-10-CM | POA: Diagnosis not present

## 2020-06-23 ENCOUNTER — Other Ambulatory Visit: Payer: Self-pay | Admitting: Cardiovascular Disease

## 2020-06-23 DIAGNOSIS — Z87891 Personal history of nicotine dependence: Secondary | ICD-10-CM | POA: Diagnosis not present

## 2020-06-23 DIAGNOSIS — I252 Old myocardial infarction: Secondary | ICD-10-CM | POA: Diagnosis not present

## 2020-06-23 DIAGNOSIS — C50912 Malignant neoplasm of unspecified site of left female breast: Secondary | ICD-10-CM | POA: Diagnosis not present

## 2020-06-23 DIAGNOSIS — E785 Hyperlipidemia, unspecified: Secondary | ICD-10-CM | POA: Diagnosis not present

## 2020-06-23 DIAGNOSIS — G629 Polyneuropathy, unspecified: Secondary | ICD-10-CM | POA: Diagnosis not present

## 2020-06-23 DIAGNOSIS — I10 Essential (primary) hypertension: Secondary | ICD-10-CM | POA: Diagnosis not present

## 2020-06-23 DIAGNOSIS — C50412 Malignant neoplasm of upper-outer quadrant of left female breast: Secondary | ICD-10-CM | POA: Diagnosis not present

## 2020-06-23 DIAGNOSIS — Z170 Estrogen receptor positive status [ER+]: Secondary | ICD-10-CM | POA: Diagnosis not present

## 2020-06-23 DIAGNOSIS — I2510 Atherosclerotic heart disease of native coronary artery without angina pectoris: Secondary | ICD-10-CM | POA: Diagnosis not present

## 2020-06-23 DIAGNOSIS — Z79899 Other long term (current) drug therapy: Secondary | ICD-10-CM | POA: Diagnosis not present

## 2020-06-29 DIAGNOSIS — Z20822 Contact with and (suspected) exposure to covid-19: Secondary | ICD-10-CM | POA: Diagnosis not present

## 2020-07-07 DIAGNOSIS — I252 Old myocardial infarction: Secondary | ICD-10-CM | POA: Diagnosis not present

## 2020-07-07 DIAGNOSIS — C50412 Malignant neoplasm of upper-outer quadrant of left female breast: Secondary | ICD-10-CM | POA: Diagnosis not present

## 2020-07-07 DIAGNOSIS — G629 Polyneuropathy, unspecified: Secondary | ICD-10-CM | POA: Diagnosis not present

## 2020-07-07 DIAGNOSIS — D649 Anemia, unspecified: Secondary | ICD-10-CM | POA: Diagnosis not present

## 2020-07-07 DIAGNOSIS — I2510 Atherosclerotic heart disease of native coronary artery without angina pectoris: Secondary | ICD-10-CM | POA: Diagnosis not present

## 2020-07-07 DIAGNOSIS — C773 Secondary and unspecified malignant neoplasm of axilla and upper limb lymph nodes: Secondary | ICD-10-CM | POA: Diagnosis not present

## 2020-07-07 DIAGNOSIS — Z955 Presence of coronary angioplasty implant and graft: Secondary | ICD-10-CM | POA: Diagnosis not present

## 2020-07-07 DIAGNOSIS — Z87891 Personal history of nicotine dependence: Secondary | ICD-10-CM | POA: Diagnosis not present

## 2020-07-07 DIAGNOSIS — Z79899 Other long term (current) drug therapy: Secondary | ICD-10-CM | POA: Diagnosis not present

## 2020-07-07 DIAGNOSIS — Z170 Estrogen receptor positive status [ER+]: Secondary | ICD-10-CM | POA: Diagnosis not present

## 2020-07-07 DIAGNOSIS — Z7902 Long term (current) use of antithrombotics/antiplatelets: Secondary | ICD-10-CM | POA: Diagnosis not present

## 2020-07-07 DIAGNOSIS — I10 Essential (primary) hypertension: Secondary | ICD-10-CM | POA: Diagnosis not present

## 2020-07-07 DIAGNOSIS — Z803 Family history of malignant neoplasm of breast: Secondary | ICD-10-CM | POA: Diagnosis not present

## 2020-07-14 DIAGNOSIS — G629 Polyneuropathy, unspecified: Secondary | ICD-10-CM | POA: Diagnosis not present

## 2020-07-14 DIAGNOSIS — D649 Anemia, unspecified: Secondary | ICD-10-CM | POA: Diagnosis not present

## 2020-07-14 DIAGNOSIS — I252 Old myocardial infarction: Secondary | ICD-10-CM | POA: Diagnosis not present

## 2020-07-14 DIAGNOSIS — C773 Secondary and unspecified malignant neoplasm of axilla and upper limb lymph nodes: Secondary | ICD-10-CM | POA: Diagnosis not present

## 2020-07-14 DIAGNOSIS — Z79899 Other long term (current) drug therapy: Secondary | ICD-10-CM | POA: Diagnosis not present

## 2020-07-14 DIAGNOSIS — C50912 Malignant neoplasm of unspecified site of left female breast: Secondary | ICD-10-CM | POA: Diagnosis not present

## 2020-07-14 DIAGNOSIS — I2510 Atherosclerotic heart disease of native coronary artery without angina pectoris: Secondary | ICD-10-CM | POA: Diagnosis not present

## 2020-07-14 DIAGNOSIS — C50412 Malignant neoplasm of upper-outer quadrant of left female breast: Secondary | ICD-10-CM | POA: Diagnosis not present

## 2020-07-17 DIAGNOSIS — C50912 Malignant neoplasm of unspecified site of left female breast: Secondary | ICD-10-CM | POA: Diagnosis not present

## 2020-07-17 DIAGNOSIS — Z170 Estrogen receptor positive status [ER+]: Secondary | ICD-10-CM | POA: Diagnosis not present

## 2020-07-17 DIAGNOSIS — Z79899 Other long term (current) drug therapy: Secondary | ICD-10-CM | POA: Diagnosis not present

## 2020-07-20 ENCOUNTER — Ambulatory Visit: Payer: PPO | Admitting: Neurology

## 2020-07-26 NOTE — Progress Notes (Signed)
Date:  08/02/2020   ID:  [name], DOB 09-22-1950, MRN [id number]  PCP:  Reynold Bowen, MD  Cardiologist:   Johnsie Cancel Electrophysiologist:  None   Evaluation Performed:  Follow-Up Visit  Chief Complaint:  CAD/PVD   History of Present Illness:    70 y.o. . anterior wall MI 1998 Rx with BMS to LAD . Edge restenosis Rx re intervention 2008 Quit smoking since then. Echo done 08/16/13 with EF 55-60% mild MR Has had anemia with polypectomy by Dr Henrene Pastor 2017 and again September 2018. BP tends to run higher in office than at home    Having neuropathic symptoms in legs and especially arms RUE worse  Started with "pinched" nerve Seen by Dr Krista Blue neurology labs ok only Vit D low Symptoms not helped by Vitamin B shots  MRI no cervical root impingement   Seen by Dr Gwenlyn Found for PVD. Has left subclavian stenosis and right CIA stenosis ABI"s > 0.94 bilateally   Unfortunaetly diagnosed with breast cancer. Paraneoplastic symptoms would seem to have been cause of her neuropathy Has had port a cath placed and [facility]    Past Medical History:  Diagnosis Date  . ABDOMINAL AORTIC ANEURYSM   . Anemia   . Arthritis    HANDS  . Blood transfusion without reported diagnosis    AUGUST 5TH 2017 x2   . CAD   . CONGESTIVE HEART FAILURE, SYSTOLIC, CHRONIC   . CT, CHEST, ABNORMAL   . GERD (gastroesophageal reflux disease)   . Heart attack ([place])    x 2  . History of colon polyps 01/05/2014   adenomatous polyps  . HYPERLIPIDEMIA   . HYPERTENSION   . ISCHEMIC CARDIOMYOPATHY   . Lower GI bleed 01/2014  . 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 Numbness and tingling   . 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 Paresthesia of both hands   . TOBACCO ABUSE    Past Surgical History:  Procedure Laterality Date  . bare metal stent placement     left anterior descending artery x 2  . COLONOSCOPY N/A 01/05/2014   Procedure: COLONOSCOPY;  Surgeon: Jerene Bears, MD;  Location:  [facility];  Service: Endoscopy;  Laterality: N/A;  . COLONOSCOPY N/A 01/06/2014   Procedure: COLONOSCOPY;  Surgeon: Jerene Bears, MD;  Location: [facility];  Service: Endoscopy;  Laterality: N/A;  . COLONOSCOPY    . POLYPECTOMY       Current Meds  Medication Sig  . carvedilol (COREG) 6.25 MG tablet TAKE 1 TABLET(6.25 MG) BY MOUTH TWICE DAILY  . clopidogrel (PLAVIX) 75 MG tablet TAKE 1 TABLET BY MOUTH EVERY DAY  . docusate sodium (COLACE) 100 MG capsule Take 100 mg by mouth 2 (two) times daily.  Marland Kitchen gabapentin (NEURONTIN) 300 MG capsule Take 1 capsule (300 mg total) by mouth 2 (two) times daily.  . iron polysaccharides (NIFEREX) 150 MG capsule Take 150 mg by mouth daily.  Marland Kitchen losartan-hydrochlorothiazide (HYZAAR) 100-25 MG tablet Take 1 tablet by mouth daily.  . pantoprazole (PROTONIX) 20 MG tablet Take 20 mg by mouth daily.  . simvastatin (ZOCOR) 40 MG tablet TAKE 1 TABLET(40 MG) BY MOUTH DAILY AT 6 PM  . VITAMIN D, CHOLECALCIFEROL, PO Take 1 tablet by mouth 2 (two) times a week.     Allergies:   Patient has no known allergies.   Social History   Tobacco Use  . Smoking status: Former Smoker  Types: Cigarettes, E-cigarettes    Quit date: 09-20-2011    Years since quitting: 9.1  . Smokeless tobacco: Never Used  Vaping Use  . Vaping Use: Some days  Substance Use Topics  . Alcohol use: Not Currently  . Drug use: No     Family Hx: The patient's family history includes Anemia in her sister; Colon polyps in her father and sister; Depression (age of onset: 32) in her father; Diabetes in her maternal grandmother; Heart attack in her father; Lung cancer in her sister; Lymphoma (age of onset: 80) in her mother. There is no history of Colon cancer, Esophageal cancer, Rectal cancer, or Stomach cancer.  ROS:   Please see the history of present illness.     All other systems reviewed and are negative.   Prior CV studies:   The following studies were reviewed today:  Carotid 03/02/20  AAA  [place] 03/02/20 Left CIA velocity 4.6 m/sec   Labs/Other Tests and Data Reviewed:    EKG:  NSR rate 68 normal ECG 08/11/17  Recent Labs: No results found for requested labs within last 8760 hours.   Recent Lipid Panel Lab Results  Component Value Date/Time   CHOL 161 07/28/2013 12:18 PM   TRIG 78.0 07/28/2013 12:18 PM   HDL 65.30 07/28/2013 12:18 PM   CHOLHDL 2 07/28/2013 12:18 PM   LDLCALC 80 07/28/2013 12:18 PM    Wt Readings from Last 3 Encounters:  08/02/20 49.9 kg  02/02/20 51.3 kg  01/17/20 51.3 kg     Objective:    Vital Signs:  BP (!) 114/58   Pulse 65   Ht 5\' 3"  (1.6 m)   Wt 49.9 kg   SpO2 93%   BMI 19.49 kg/m    Affect appropriate Chronically ill white female  HEENT: normal Neck supple with no adenopathy JVP normal right bruits no thyromegaly Lungs clear with no wheezing and good diaphragmatic motion Heart:  S1/S2 no murmur, no rub, gallop or click PMI normal port a cath over right chest  Abdomen: benighn, BS positve, no tenderness, no AAA no bruit.  No HSM or HJR Distal pulses intact with no bruits No edema Neuro non-focal sensory deficits arms/legs  Skin warm and dry No muscular weakness   ASSESSMENT & PLAN:    CAD: Stable with no angina and good activity level.  Continue medical Rx  LAD stent in 21 with re intervention 09-20-06 low dose beta blocker due to bradycardia   HTN: Well controlled.  Continue current medications and low sodium Dash type diet.  Home readings normal white coat component   HLD:  On statin labs with primary   Anemia:  On iron improved no bleeding  02/19/16 EGD normal colonoscopy with removal of 8 polyps Dr Henrene Pastor did repeat Colonoscopy 02/13/18 and removed 2 more polyps   PVD: f/u Dr Gwenlyn Found moderate LLE dx and left subclavian stenosis without arm weakness Duplex 03/01/20 no ICA Stenosis Left subclavian stenosis but antegrade vertebral flow  Peak velocity 3.55 m/sec   Depression: Sister died 09-19-16  husband with DCM and renal cell  cancer PRN valium   Neuropathy:  F/u neurology lab work and MRI's unrevealing to date 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  The importance of social distancing was discussed today.   Medication Adjustments/Labs and Tests Ordered: Current medicines are reviewed at length with the patient today.  Concerns regarding medicines are outlined above.   Tests Ordered:  Carotid and LE ABI's October 2022   Medication Changes:  None   Disposition:  Follow up with Dr Gwenlyn Found May and me in a year   Signed, Jenkins Rouge, MD  08/02/2020 9:02 AM    [place]

## 2020-07-28 DIAGNOSIS — Z5111 Encounter for antineoplastic chemotherapy: Secondary | ICD-10-CM | POA: Diagnosis not present

## 2020-07-28 DIAGNOSIS — D649 Anemia, unspecified: Secondary | ICD-10-CM | POA: Diagnosis not present

## 2020-07-28 DIAGNOSIS — D6481 Anemia due to antineoplastic chemotherapy: Secondary | ICD-10-CM | POA: Diagnosis not present

## 2020-07-28 DIAGNOSIS — G629 Polyneuropathy, unspecified: Secondary | ICD-10-CM | POA: Diagnosis not present

## 2020-07-28 DIAGNOSIS — C50912 Malignant neoplasm of unspecified site of left female breast: Secondary | ICD-10-CM | POA: Diagnosis not present

## 2020-07-28 DIAGNOSIS — E538 Deficiency of other specified B group vitamins: Secondary | ICD-10-CM | POA: Diagnosis not present

## 2020-07-28 DIAGNOSIS — Z803 Family history of malignant neoplasm of breast: Secondary | ICD-10-CM | POA: Diagnosis not present

## 2020-07-28 DIAGNOSIS — I219 Acute myocardial infarction, unspecified: Secondary | ICD-10-CM | POA: Diagnosis not present

## 2020-07-28 DIAGNOSIS — R5383 Other fatigue: Secondary | ICD-10-CM | POA: Diagnosis not present

## 2020-07-28 DIAGNOSIS — Z955 Presence of coronary angioplasty implant and graft: Secondary | ICD-10-CM | POA: Diagnosis not present

## 2020-07-28 DIAGNOSIS — Z87891 Personal history of nicotine dependence: Secondary | ICD-10-CM | POA: Diagnosis not present

## 2020-07-28 DIAGNOSIS — Z7902 Long term (current) use of antithrombotics/antiplatelets: Secondary | ICD-10-CM | POA: Diagnosis not present

## 2020-07-28 DIAGNOSIS — Z79899 Other long term (current) drug therapy: Secondary | ICD-10-CM | POA: Diagnosis not present

## 2020-07-28 DIAGNOSIS — I252 Old myocardial infarction: Secondary | ICD-10-CM | POA: Diagnosis not present

## 2020-07-28 DIAGNOSIS — Z170 Estrogen receptor positive status [ER+]: Secondary | ICD-10-CM | POA: Diagnosis not present

## 2020-07-28 DIAGNOSIS — Z95828 Presence of other vascular implants and grafts: Secondary | ICD-10-CM | POA: Diagnosis not present

## 2020-08-02 ENCOUNTER — Ambulatory Visit: Payer: PPO | Admitting: Cardiovascular Disease

## 2020-08-02 ENCOUNTER — Encounter: Payer: Self-pay | Admitting: Cardiovascular Disease

## 2020-08-02 ENCOUNTER — Other Ambulatory Visit: Payer: Self-pay

## 2020-08-02 VITALS — BP 114/58 | HR 65 | Ht 63.0 in | Wt 110.0 lb

## 2020-08-02 DIAGNOSIS — I2510 Atherosclerotic heart disease of native coronary artery without angina pectoris: Secondary | ICD-10-CM

## 2020-08-02 DIAGNOSIS — I6523 Occlusion and stenosis of bilateral carotid arteries: Secondary | ICD-10-CM

## 2020-08-02 DIAGNOSIS — I739 Peripheral vascular disease, unspecified: Secondary | ICD-10-CM | POA: Diagnosis not present

## 2020-08-02 NOTE — Patient Instructions (Addendum)
Medication Instructions:  *If you need a refill on your cardiac medications before your next appointment, please call your pharmacy*   Lab Work: If you have labs (blood work) drawn today and your tests are completely normal, you will receive your results only by: Marland Kitchen MyChart Message (if you have MyChart) OR . A paper copy in the mail If you have any lab test that is abnormal or we need to change your treatment, we will call you to review the results.  Testing/Procedures: Your physician has requested that you have a carotid duplex in August. This test is an ultrasound of the carotid arteries in your neck. It looks at blood flow through these arteries that supply the brain with blood. Allow one hour for this exam. There are no restrictions or special instructions.  Your physician has requested that you have an ankle brachial index (ABI) and lower extremity arterial duplex in October. During this test an ultrasound and blood pressure cuff are used to evaluate the arteries that supply the arms and legs with blood. Allow thirty minutes for this exam. There are no restrictions or special instructions.  Follow-Up: At [facility], you and your health needs are our priority.  As part of our continuing mission to provide you with exceptional heart care, we have created designated Provider Care Teams.  These Care Teams include your primary Cardiologist (physician) and Advanced Practice Providers (APPs -  Physician Assistants and Nurse Practitioners) who all work together to provide you with the care you need, when you need it.  We recommend signing up for the patient portal called "MyChart".  Sign up information is provided on this After Visit Summary.  MyChart is used to connect with patients for Virtual Visits (Telemedicine).  Patients are able to view lab/test results, encounter notes, upcoming appointments, etc.  Non-urgent messages can be sent to your provider as well.   To learn more about what you  can do with MyChart, go to [email or web address].    Your next appointment:   6 month(s)  The format for your next appointment:   In Person  Provider:   You may see Dr. Johnsie Cancel or one of the following Advanced Practice Providers on your designated Care Team:   Kathyrn Drown NP

## 2020-08-03 DIAGNOSIS — E785 Hyperlipidemia, unspecified: Secondary | ICD-10-CM | POA: Diagnosis not present

## 2020-08-03 DIAGNOSIS — E559 Vitamin D deficiency, unspecified: Secondary | ICD-10-CM | POA: Diagnosis not present

## 2020-08-03 DIAGNOSIS — E538 Deficiency of other specified B group vitamins: Secondary | ICD-10-CM | POA: Diagnosis not present

## 2020-08-04 DIAGNOSIS — C50412 Malignant neoplasm of upper-outer quadrant of left female breast: Secondary | ICD-10-CM | POA: Diagnosis not present

## 2020-08-04 DIAGNOSIS — Z5111 Encounter for antineoplastic chemotherapy: Secondary | ICD-10-CM | POA: Diagnosis not present

## 2020-08-04 DIAGNOSIS — Z170 Estrogen receptor positive status [ER+]: Secondary | ICD-10-CM | POA: Diagnosis not present

## 2020-08-05 DIAGNOSIS — C50912 Malignant neoplasm of unspecified site of left female breast: Secondary | ICD-10-CM | POA: Diagnosis not present

## 2020-08-05 DIAGNOSIS — Z7689 Persons encountering health services in other specified circumstances: Secondary | ICD-10-CM | POA: Diagnosis not present

## 2020-08-05 DIAGNOSIS — Z170 Estrogen receptor positive status [ER+]: Secondary | ICD-10-CM | POA: Diagnosis not present

## 2020-08-09 DIAGNOSIS — Z1339 Encounter for screening examination for other mental health and behavioral disorders: Secondary | ICD-10-CM | POA: Diagnosis not present

## 2020-08-09 DIAGNOSIS — I6523 Occlusion and stenosis of bilateral carotid arteries: Secondary | ICD-10-CM | POA: Diagnosis not present

## 2020-08-09 DIAGNOSIS — E785 Hyperlipidemia, unspecified: Secondary | ICD-10-CM | POA: Diagnosis not present

## 2020-08-09 DIAGNOSIS — I10 Essential (primary) hypertension: Secondary | ICD-10-CM | POA: Diagnosis not present

## 2020-08-09 DIAGNOSIS — K7689 Other specified diseases of liver: Secondary | ICD-10-CM | POA: Diagnosis not present

## 2020-08-09 DIAGNOSIS — I739 Peripheral vascular disease, unspecified: Secondary | ICD-10-CM | POA: Diagnosis not present

## 2020-08-09 DIAGNOSIS — J432 Centrilobular emphysema: Secondary | ICD-10-CM | POA: Diagnosis not present

## 2020-08-09 DIAGNOSIS — Z0000 Encounter for general adult medical examination without abnormal findings: Secondary | ICD-10-CM | POA: Diagnosis not present

## 2020-08-09 DIAGNOSIS — Z1331 Encounter for screening for depression: Secondary | ICD-10-CM | POA: Diagnosis not present

## 2020-08-09 DIAGNOSIS — I2510 Atherosclerotic heart disease of native coronary artery without angina pectoris: Secondary | ICD-10-CM | POA: Diagnosis not present

## 2020-08-09 DIAGNOSIS — I700 Atherosclerosis of aorta: Secondary | ICD-10-CM | POA: Diagnosis not present

## 2020-08-09 DIAGNOSIS — R82998 Other abnormal findings in urine: Secondary | ICD-10-CM | POA: Diagnosis not present

## 2020-08-09 DIAGNOSIS — C50912 Malignant neoplasm of unspecified site of left female breast: Secondary | ICD-10-CM | POA: Diagnosis not present

## 2020-08-09 DIAGNOSIS — Z170 Estrogen receptor positive status [ER+]: Secondary | ICD-10-CM | POA: Diagnosis not present

## 2020-08-09 DIAGNOSIS — G609 Hereditary and idiopathic neuropathy, unspecified: Secondary | ICD-10-CM | POA: Diagnosis not present

## 2020-08-09 DIAGNOSIS — I714 Abdominal aortic aneurysm, without rupture: Secondary | ICD-10-CM | POA: Diagnosis not present

## 2020-08-18 DIAGNOSIS — C50912 Malignant neoplasm of unspecified site of left female breast: Secondary | ICD-10-CM | POA: Diagnosis not present

## 2020-08-18 DIAGNOSIS — Z170 Estrogen receptor positive status [ER+]: Secondary | ICD-10-CM | POA: Diagnosis not present

## 2020-08-18 DIAGNOSIS — Z79899 Other long term (current) drug therapy: Secondary | ICD-10-CM | POA: Diagnosis not present

## 2020-08-18 DIAGNOSIS — Z5111 Encounter for antineoplastic chemotherapy: Secondary | ICD-10-CM | POA: Diagnosis not present

## 2020-08-25 DIAGNOSIS — Z79899 Other long term (current) drug therapy: Secondary | ICD-10-CM | POA: Diagnosis not present

## 2020-08-25 DIAGNOSIS — M21371 Foot drop, right foot: Secondary | ICD-10-CM | POA: Diagnosis not present

## 2020-08-25 DIAGNOSIS — D649 Anemia, unspecified: Secondary | ICD-10-CM | POA: Diagnosis not present

## 2020-08-25 DIAGNOSIS — R200 Anesthesia of skin: Secondary | ICD-10-CM | POA: Diagnosis not present

## 2020-08-25 DIAGNOSIS — I252 Old myocardial infarction: Secondary | ICD-10-CM | POA: Diagnosis not present

## 2020-08-25 DIAGNOSIS — D499 Neoplasm of unspecified behavior of unspecified site: Secondary | ICD-10-CM | POA: Diagnosis not present

## 2020-08-25 DIAGNOSIS — Z87891 Personal history of nicotine dependence: Secondary | ICD-10-CM | POA: Diagnosis not present

## 2020-08-25 DIAGNOSIS — Z8249 Family history of ischemic heart disease and other diseases of the circulatory system: Secondary | ICD-10-CM | POA: Diagnosis not present

## 2020-08-25 DIAGNOSIS — G629 Polyneuropathy, unspecified: Secondary | ICD-10-CM | POA: Diagnosis not present

## 2020-08-25 DIAGNOSIS — R269 Unspecified abnormalities of gait and mobility: Secondary | ICD-10-CM | POA: Diagnosis not present

## 2020-08-25 DIAGNOSIS — G549 Nerve root and plexus disorder, unspecified: Secondary | ICD-10-CM | POA: Diagnosis not present

## 2020-08-25 DIAGNOSIS — Z7902 Long term (current) use of antithrombotics/antiplatelets: Secondary | ICD-10-CM | POA: Diagnosis not present

## 2020-08-25 DIAGNOSIS — C50912 Malignant neoplasm of unspecified site of left female breast: Secondary | ICD-10-CM | POA: Diagnosis not present

## 2020-08-25 DIAGNOSIS — G130 Paraneoplastic neuromyopathy and neuropathy: Secondary | ICD-10-CM | POA: Diagnosis not present

## 2020-08-25 DIAGNOSIS — R5383 Other fatigue: Secondary | ICD-10-CM | POA: Diagnosis not present

## 2020-08-25 DIAGNOSIS — Z170 Estrogen receptor positive status [ER+]: Secondary | ICD-10-CM | POA: Diagnosis not present

## 2020-08-25 DIAGNOSIS — R531 Weakness: Secondary | ICD-10-CM | POA: Diagnosis not present

## 2020-08-25 DIAGNOSIS — E538 Deficiency of other specified B group vitamins: Secondary | ICD-10-CM | POA: Diagnosis not present

## 2020-08-26 DIAGNOSIS — Z7689 Persons encountering health services in other specified circumstances: Secondary | ICD-10-CM | POA: Diagnosis not present

## 2020-08-26 DIAGNOSIS — C50412 Malignant neoplasm of upper-outer quadrant of left female breast: Secondary | ICD-10-CM | POA: Diagnosis not present

## 2020-08-26 DIAGNOSIS — Z170 Estrogen receptor positive status [ER+]: Secondary | ICD-10-CM | POA: Diagnosis not present

## 2020-09-06 DIAGNOSIS — I700 Atherosclerosis of aorta: Secondary | ICD-10-CM | POA: Diagnosis not present

## 2020-09-06 DIAGNOSIS — I083 Combined rheumatic disorders of mitral, aortic and tricuspid valves: Secondary | ICD-10-CM | POA: Diagnosis not present

## 2020-09-06 DIAGNOSIS — I272 Pulmonary hypertension, unspecified: Secondary | ICD-10-CM | POA: Diagnosis not present

## 2020-09-06 DIAGNOSIS — I361 Nonrheumatic tricuspid (valve) insufficiency: Secondary | ICD-10-CM | POA: Diagnosis not present

## 2020-09-06 DIAGNOSIS — Z5111 Encounter for antineoplastic chemotherapy: Secondary | ICD-10-CM | POA: Diagnosis not present

## 2020-09-08 DIAGNOSIS — D709 Neutropenia, unspecified: Secondary | ICD-10-CM | POA: Diagnosis not present

## 2020-09-08 DIAGNOSIS — C50912 Malignant neoplasm of unspecified site of left female breast: Secondary | ICD-10-CM | POA: Diagnosis not present

## 2020-09-08 DIAGNOSIS — Z170 Estrogen receptor positive status [ER+]: Secondary | ICD-10-CM | POA: Diagnosis not present

## 2020-09-14 DIAGNOSIS — I2510 Atherosclerotic heart disease of native coronary artery without angina pectoris: Secondary | ICD-10-CM | POA: Diagnosis not present

## 2020-09-14 DIAGNOSIS — D649 Anemia, unspecified: Secondary | ICD-10-CM | POA: Diagnosis not present

## 2020-09-14 DIAGNOSIS — Z170 Estrogen receptor positive status [ER+]: Secondary | ICD-10-CM | POA: Diagnosis not present

## 2020-09-14 DIAGNOSIS — Z5111 Encounter for antineoplastic chemotherapy: Secondary | ICD-10-CM | POA: Diagnosis not present

## 2020-09-14 DIAGNOSIS — E538 Deficiency of other specified B group vitamins: Secondary | ICD-10-CM | POA: Diagnosis not present

## 2020-09-14 DIAGNOSIS — R590 Localized enlarged lymph nodes: Secondary | ICD-10-CM | POA: Diagnosis not present

## 2020-09-14 DIAGNOSIS — C50912 Malignant neoplasm of unspecified site of left female breast: Secondary | ICD-10-CM | POA: Diagnosis not present

## 2020-09-14 DIAGNOSIS — Z95828 Presence of other vascular implants and grafts: Secondary | ICD-10-CM | POA: Diagnosis not present

## 2020-09-14 DIAGNOSIS — G620 Drug-induced polyneuropathy: Secondary | ICD-10-CM | POA: Diagnosis not present

## 2020-09-14 DIAGNOSIS — Z7902 Long term (current) use of antithrombotics/antiplatelets: Secondary | ICD-10-CM | POA: Diagnosis not present

## 2020-09-14 DIAGNOSIS — G629 Polyneuropathy, unspecified: Secondary | ICD-10-CM | POA: Diagnosis not present

## 2020-09-14 DIAGNOSIS — F419 Anxiety disorder, unspecified: Secondary | ICD-10-CM | POA: Diagnosis not present

## 2020-09-14 DIAGNOSIS — Z803 Family history of malignant neoplasm of breast: Secondary | ICD-10-CM | POA: Diagnosis not present

## 2020-09-14 DIAGNOSIS — Z79899 Other long term (current) drug therapy: Secondary | ICD-10-CM | POA: Diagnosis not present

## 2020-09-14 DIAGNOSIS — Z87891 Personal history of nicotine dependence: Secondary | ICD-10-CM | POA: Diagnosis not present

## 2020-09-14 DIAGNOSIS — I219 Acute myocardial infarction, unspecified: Secondary | ICD-10-CM | POA: Diagnosis not present

## 2020-09-14 DIAGNOSIS — F32A Depression, unspecified: Secondary | ICD-10-CM | POA: Diagnosis not present

## 2020-09-21 DIAGNOSIS — Z79899 Other long term (current) drug therapy: Secondary | ICD-10-CM | POA: Diagnosis not present

## 2020-09-21 DIAGNOSIS — E538 Deficiency of other specified B group vitamins: Secondary | ICD-10-CM | POA: Diagnosis not present

## 2020-09-21 DIAGNOSIS — Z170 Estrogen receptor positive status [ER+]: Secondary | ICD-10-CM | POA: Diagnosis not present

## 2020-09-21 DIAGNOSIS — Z5111 Encounter for antineoplastic chemotherapy: Secondary | ICD-10-CM | POA: Diagnosis not present

## 2020-09-21 DIAGNOSIS — C50912 Malignant neoplasm of unspecified site of left female breast: Secondary | ICD-10-CM | POA: Diagnosis not present

## 2020-09-22 DIAGNOSIS — C50412 Malignant neoplasm of upper-outer quadrant of left female breast: Secondary | ICD-10-CM | POA: Diagnosis not present

## 2020-09-22 DIAGNOSIS — Z170 Estrogen receptor positive status [ER+]: Secondary | ICD-10-CM | POA: Diagnosis not present

## 2020-09-23 ENCOUNTER — Other Ambulatory Visit: Payer: Self-pay | Admitting: Cardiovascular Disease

## 2020-09-25 DIAGNOSIS — R200 Anesthesia of skin: Secondary | ICD-10-CM | POA: Diagnosis not present

## 2020-09-25 DIAGNOSIS — R29898 Other symptoms and signs involving the musculoskeletal system: Secondary | ICD-10-CM | POA: Diagnosis not present

## 2020-09-25 DIAGNOSIS — Z7409 Other reduced mobility: Secondary | ICD-10-CM | POA: Diagnosis not present

## 2020-09-25 DIAGNOSIS — G549 Nerve root and plexus disorder, unspecified: Secondary | ICD-10-CM | POA: Diagnosis not present

## 2020-09-25 DIAGNOSIS — R2681 Unsteadiness on feet: Secondary | ICD-10-CM | POA: Diagnosis not present

## 2020-09-25 DIAGNOSIS — R531 Weakness: Secondary | ICD-10-CM | POA: Diagnosis not present

## 2020-09-27 DIAGNOSIS — R200 Anesthesia of skin: Secondary | ICD-10-CM | POA: Diagnosis not present

## 2020-09-27 DIAGNOSIS — R2681 Unsteadiness on feet: Secondary | ICD-10-CM | POA: Diagnosis not present

## 2020-09-27 DIAGNOSIS — G600 Hereditary motor and sensory neuropathy: Secondary | ICD-10-CM | POA: Diagnosis not present

## 2020-09-27 DIAGNOSIS — R29898 Other symptoms and signs involving the musculoskeletal system: Secondary | ICD-10-CM | POA: Diagnosis not present

## 2020-09-27 DIAGNOSIS — Z7409 Other reduced mobility: Secondary | ICD-10-CM | POA: Diagnosis not present

## 2020-09-27 DIAGNOSIS — R531 Weakness: Secondary | ICD-10-CM | POA: Diagnosis not present

## 2020-10-02 DIAGNOSIS — R531 Weakness: Secondary | ICD-10-CM | POA: Diagnosis not present

## 2020-10-05 DIAGNOSIS — Z87891 Personal history of nicotine dependence: Secondary | ICD-10-CM | POA: Diagnosis not present

## 2020-10-05 DIAGNOSIS — D649 Anemia, unspecified: Secondary | ICD-10-CM | POA: Diagnosis not present

## 2020-10-05 DIAGNOSIS — C50412 Malignant neoplasm of upper-outer quadrant of left female breast: Secondary | ICD-10-CM | POA: Diagnosis not present

## 2020-10-05 DIAGNOSIS — D6481 Anemia due to antineoplastic chemotherapy: Secondary | ICD-10-CM | POA: Diagnosis not present

## 2020-10-05 DIAGNOSIS — I493 Ventricular premature depolarization: Secondary | ICD-10-CM | POA: Diagnosis not present

## 2020-10-05 DIAGNOSIS — G629 Polyneuropathy, unspecified: Secondary | ICD-10-CM | POA: Diagnosis not present

## 2020-10-05 DIAGNOSIS — Z79899 Other long term (current) drug therapy: Secondary | ICD-10-CM | POA: Diagnosis not present

## 2020-10-05 DIAGNOSIS — I252 Old myocardial infarction: Secondary | ICD-10-CM | POA: Diagnosis not present

## 2020-10-05 DIAGNOSIS — Z95818 Presence of other cardiac implants and grafts: Secondary | ICD-10-CM | POA: Diagnosis not present

## 2020-10-05 DIAGNOSIS — E785 Hyperlipidemia, unspecified: Secondary | ICD-10-CM | POA: Diagnosis not present

## 2020-10-05 DIAGNOSIS — I10 Essential (primary) hypertension: Secondary | ICD-10-CM | POA: Diagnosis not present

## 2020-10-05 DIAGNOSIS — I2510 Atherosclerotic heart disease of native coronary artery without angina pectoris: Secondary | ICD-10-CM | POA: Diagnosis not present

## 2020-10-05 DIAGNOSIS — Z95828 Presence of other vascular implants and grafts: Secondary | ICD-10-CM | POA: Diagnosis not present

## 2020-10-05 DIAGNOSIS — C50912 Malignant neoplasm of unspecified site of left female breast: Secondary | ICD-10-CM | POA: Diagnosis not present

## 2020-10-05 DIAGNOSIS — D72829 Elevated white blood cell count, unspecified: Secondary | ICD-10-CM | POA: Diagnosis not present

## 2020-10-05 DIAGNOSIS — G9009 Other idiopathic peripheral autonomic neuropathy: Secondary | ICD-10-CM | POA: Diagnosis not present

## 2020-10-05 DIAGNOSIS — Z5111 Encounter for antineoplastic chemotherapy: Secondary | ICD-10-CM | POA: Diagnosis not present

## 2020-10-05 DIAGNOSIS — E538 Deficiency of other specified B group vitamins: Secondary | ICD-10-CM | POA: Diagnosis not present

## 2020-10-05 DIAGNOSIS — Z170 Estrogen receptor positive status [ER+]: Secondary | ICD-10-CM | POA: Diagnosis not present

## 2020-10-05 DIAGNOSIS — Z803 Family history of malignant neoplasm of breast: Secondary | ICD-10-CM | POA: Diagnosis not present

## 2020-10-05 DIAGNOSIS — C773 Secondary and unspecified malignant neoplasm of axilla and upper limb lymph nodes: Secondary | ICD-10-CM | POA: Diagnosis not present

## 2020-10-12 DIAGNOSIS — C773 Secondary and unspecified malignant neoplasm of axilla and upper limb lymph nodes: Secondary | ICD-10-CM | POA: Diagnosis not present

## 2020-10-12 DIAGNOSIS — C50412 Malignant neoplasm of upper-outer quadrant of left female breast: Secondary | ICD-10-CM | POA: Diagnosis not present

## 2020-10-12 DIAGNOSIS — Z170 Estrogen receptor positive status [ER+]: Secondary | ICD-10-CM | POA: Diagnosis not present

## 2020-10-12 DIAGNOSIS — Z95828 Presence of other vascular implants and grafts: Secondary | ICD-10-CM | POA: Diagnosis not present

## 2020-10-12 DIAGNOSIS — Z5111 Encounter for antineoplastic chemotherapy: Secondary | ICD-10-CM | POA: Diagnosis not present

## 2020-10-13 DIAGNOSIS — Z170 Estrogen receptor positive status [ER+]: Secondary | ICD-10-CM | POA: Diagnosis not present

## 2020-10-13 DIAGNOSIS — C773 Secondary and unspecified malignant neoplasm of axilla and upper limb lymph nodes: Secondary | ICD-10-CM | POA: Diagnosis not present

## 2020-10-13 DIAGNOSIS — C50412 Malignant neoplasm of upper-outer quadrant of left female breast: Secondary | ICD-10-CM | POA: Diagnosis not present

## 2020-10-26 DIAGNOSIS — D6481 Anemia due to antineoplastic chemotherapy: Secondary | ICD-10-CM | POA: Diagnosis not present

## 2020-10-26 DIAGNOSIS — E785 Hyperlipidemia, unspecified: Secondary | ICD-10-CM | POA: Diagnosis not present

## 2020-10-26 DIAGNOSIS — Z803 Family history of malignant neoplasm of breast: Secondary | ICD-10-CM | POA: Diagnosis not present

## 2020-10-26 DIAGNOSIS — I252 Old myocardial infarction: Secondary | ICD-10-CM | POA: Diagnosis not present

## 2020-10-26 DIAGNOSIS — I119 Hypertensive heart disease without heart failure: Secondary | ICD-10-CM | POA: Diagnosis not present

## 2020-10-26 DIAGNOSIS — G629 Polyneuropathy, unspecified: Secondary | ICD-10-CM | POA: Diagnosis not present

## 2020-10-26 DIAGNOSIS — I2510 Atherosclerotic heart disease of native coronary artery without angina pectoris: Secondary | ICD-10-CM | POA: Diagnosis not present

## 2020-10-26 DIAGNOSIS — E538 Deficiency of other specified B group vitamins: Secondary | ICD-10-CM | POA: Diagnosis not present

## 2020-10-26 DIAGNOSIS — C50412 Malignant neoplasm of upper-outer quadrant of left female breast: Secondary | ICD-10-CM | POA: Diagnosis not present

## 2020-10-26 DIAGNOSIS — Z79899 Other long term (current) drug therapy: Secondary | ICD-10-CM | POA: Diagnosis not present

## 2020-10-26 DIAGNOSIS — Z95828 Presence of other vascular implants and grafts: Secondary | ICD-10-CM | POA: Diagnosis not present

## 2020-10-26 DIAGNOSIS — Z170 Estrogen receptor positive status [ER+]: Secondary | ICD-10-CM | POA: Diagnosis not present

## 2020-10-26 DIAGNOSIS — Z87891 Personal history of nicotine dependence: Secondary | ICD-10-CM | POA: Diagnosis not present

## 2020-10-26 DIAGNOSIS — I739 Peripheral vascular disease, unspecified: Secondary | ICD-10-CM | POA: Diagnosis not present

## 2020-10-26 DIAGNOSIS — C50912 Malignant neoplasm of unspecified site of left female breast: Secondary | ICD-10-CM | POA: Diagnosis not present

## 2020-10-26 DIAGNOSIS — C773 Secondary and unspecified malignant neoplasm of axilla and upper limb lymph nodes: Secondary | ICD-10-CM | POA: Diagnosis not present

## 2020-10-26 DIAGNOSIS — Z5111 Encounter for antineoplastic chemotherapy: Secondary | ICD-10-CM | POA: Diagnosis not present

## 2020-11-01 DIAGNOSIS — R531 Weakness: Secondary | ICD-10-CM | POA: Diagnosis not present

## 2020-11-02 DIAGNOSIS — C50412 Malignant neoplasm of upper-outer quadrant of left female breast: Secondary | ICD-10-CM | POA: Diagnosis not present

## 2020-11-02 DIAGNOSIS — C773 Secondary and unspecified malignant neoplasm of axilla and upper limb lymph nodes: Secondary | ICD-10-CM | POA: Diagnosis not present

## 2020-11-02 DIAGNOSIS — Z170 Estrogen receptor positive status [ER+]: Secondary | ICD-10-CM | POA: Diagnosis not present

## 2020-11-02 DIAGNOSIS — Z95828 Presence of other vascular implants and grafts: Secondary | ICD-10-CM | POA: Diagnosis not present

## 2020-11-02 DIAGNOSIS — Z5111 Encounter for antineoplastic chemotherapy: Secondary | ICD-10-CM | POA: Diagnosis not present

## 2020-11-03 DIAGNOSIS — C773 Secondary and unspecified malignant neoplasm of axilla and upper limb lymph nodes: Secondary | ICD-10-CM | POA: Diagnosis not present

## 2020-11-03 DIAGNOSIS — Z170 Estrogen receptor positive status [ER+]: Secondary | ICD-10-CM | POA: Diagnosis not present

## 2020-11-03 DIAGNOSIS — C50412 Malignant neoplasm of upper-outer quadrant of left female breast: Secondary | ICD-10-CM | POA: Diagnosis not present

## 2020-11-10 DIAGNOSIS — G549 Nerve root and plexus disorder, unspecified: Secondary | ICD-10-CM | POA: Diagnosis not present

## 2020-11-10 DIAGNOSIS — R531 Weakness: Secondary | ICD-10-CM | POA: Diagnosis not present

## 2020-11-10 DIAGNOSIS — G629 Polyneuropathy, unspecified: Secondary | ICD-10-CM | POA: Diagnosis not present

## 2020-11-10 DIAGNOSIS — R200 Anesthesia of skin: Secondary | ICD-10-CM | POA: Diagnosis not present

## 2020-11-16 DIAGNOSIS — R9389 Abnormal findings on diagnostic imaging of other specified body structures: Secondary | ICD-10-CM | POA: Diagnosis not present

## 2020-11-16 DIAGNOSIS — D8989 Other specified disorders involving the immune mechanism, not elsewhere classified: Secondary | ICD-10-CM | POA: Diagnosis not present

## 2020-11-16 DIAGNOSIS — C773 Secondary and unspecified malignant neoplasm of axilla and upper limb lymph nodes: Secondary | ICD-10-CM | POA: Diagnosis not present

## 2020-11-16 DIAGNOSIS — C50412 Malignant neoplasm of upper-outer quadrant of left female breast: Secondary | ICD-10-CM | POA: Diagnosis not present

## 2020-11-16 DIAGNOSIS — D630 Anemia in neoplastic disease: Secondary | ICD-10-CM | POA: Diagnosis not present

## 2020-11-16 DIAGNOSIS — Z170 Estrogen receptor positive status [ER+]: Secondary | ICD-10-CM | POA: Diagnosis not present

## 2020-11-16 DIAGNOSIS — Z95828 Presence of other vascular implants and grafts: Secondary | ICD-10-CM | POA: Diagnosis not present

## 2020-11-16 DIAGNOSIS — Z5111 Encounter for antineoplastic chemotherapy: Secondary | ICD-10-CM | POA: Diagnosis not present

## 2020-11-17 DIAGNOSIS — C50412 Malignant neoplasm of upper-outer quadrant of left female breast: Secondary | ICD-10-CM | POA: Diagnosis not present

## 2020-11-17 DIAGNOSIS — R928 Other abnormal and inconclusive findings on diagnostic imaging of breast: Secondary | ICD-10-CM | POA: Diagnosis not present

## 2020-11-30 DIAGNOSIS — Z79899 Other long term (current) drug therapy: Secondary | ICD-10-CM | POA: Diagnosis not present

## 2020-11-30 DIAGNOSIS — C50912 Malignant neoplasm of unspecified site of left female breast: Secondary | ICD-10-CM | POA: Diagnosis not present

## 2020-11-30 DIAGNOSIS — Z01810 Encounter for preprocedural cardiovascular examination: Secondary | ICD-10-CM | POA: Diagnosis not present

## 2020-12-01 DIAGNOSIS — Z7902 Long term (current) use of antithrombotics/antiplatelets: Secondary | ICD-10-CM | POA: Diagnosis not present

## 2020-12-01 DIAGNOSIS — L7682 Other postprocedural complications of skin and subcutaneous tissue: Secondary | ICD-10-CM | POA: Diagnosis not present

## 2020-12-01 DIAGNOSIS — Z803 Family history of malignant neoplasm of breast: Secondary | ICD-10-CM | POA: Diagnosis not present

## 2020-12-01 DIAGNOSIS — Z79899 Other long term (current) drug therapy: Secondary | ICD-10-CM | POA: Diagnosis not present

## 2020-12-01 DIAGNOSIS — C50912 Malignant neoplasm of unspecified site of left female breast: Secondary | ICD-10-CM | POA: Diagnosis not present

## 2020-12-01 DIAGNOSIS — Z7982 Long term (current) use of aspirin: Secondary | ICD-10-CM | POA: Diagnosis not present

## 2020-12-07 DIAGNOSIS — C50412 Malignant neoplasm of upper-outer quadrant of left female breast: Secondary | ICD-10-CM | POA: Diagnosis not present

## 2020-12-07 DIAGNOSIS — Z7902 Long term (current) use of antithrombotics/antiplatelets: Secondary | ICD-10-CM | POA: Diagnosis not present

## 2020-12-07 DIAGNOSIS — I252 Old myocardial infarction: Secondary | ICD-10-CM | POA: Diagnosis not present

## 2020-12-07 DIAGNOSIS — Z8679 Personal history of other diseases of the circulatory system: Secondary | ICD-10-CM | POA: Diagnosis not present

## 2020-12-07 DIAGNOSIS — I739 Peripheral vascular disease, unspecified: Secondary | ICD-10-CM | POA: Diagnosis not present

## 2020-12-07 DIAGNOSIS — Z95828 Presence of other vascular implants and grafts: Secondary | ICD-10-CM | POA: Diagnosis not present

## 2020-12-07 DIAGNOSIS — Z79899 Other long term (current) drug therapy: Secondary | ICD-10-CM | POA: Diagnosis not present

## 2020-12-07 DIAGNOSIS — E538 Deficiency of other specified B group vitamins: Secondary | ICD-10-CM | POA: Diagnosis not present

## 2020-12-07 DIAGNOSIS — R5381 Other malaise: Secondary | ICD-10-CM | POA: Diagnosis not present

## 2020-12-07 DIAGNOSIS — I2510 Atherosclerotic heart disease of native coronary artery without angina pectoris: Secondary | ICD-10-CM | POA: Diagnosis not present

## 2020-12-07 DIAGNOSIS — G629 Polyneuropathy, unspecified: Secondary | ICD-10-CM | POA: Diagnosis not present

## 2020-12-07 DIAGNOSIS — D6481 Anemia due to antineoplastic chemotherapy: Secondary | ICD-10-CM | POA: Diagnosis not present

## 2020-12-07 DIAGNOSIS — Z87891 Personal history of nicotine dependence: Secondary | ICD-10-CM | POA: Diagnosis not present

## 2020-12-07 DIAGNOSIS — C50912 Malignant neoplasm of unspecified site of left female breast: Secondary | ICD-10-CM | POA: Diagnosis not present

## 2020-12-07 DIAGNOSIS — D630 Anemia in neoplastic disease: Secondary | ICD-10-CM | POA: Diagnosis not present

## 2020-12-07 DIAGNOSIS — C773 Secondary and unspecified malignant neoplasm of axilla and upper limb lymph nodes: Secondary | ICD-10-CM | POA: Diagnosis not present

## 2020-12-07 DIAGNOSIS — Z5111 Encounter for antineoplastic chemotherapy: Secondary | ICD-10-CM | POA: Diagnosis not present

## 2020-12-07 DIAGNOSIS — Z803 Family history of malignant neoplasm of breast: Secondary | ICD-10-CM | POA: Diagnosis not present

## 2020-12-07 DIAGNOSIS — E785 Hyperlipidemia, unspecified: Secondary | ICD-10-CM | POA: Diagnosis not present

## 2020-12-07 DIAGNOSIS — I119 Hypertensive heart disease without heart failure: Secondary | ICD-10-CM | POA: Diagnosis not present

## 2020-12-07 DIAGNOSIS — Z170 Estrogen receptor positive status [ER+]: Secondary | ICD-10-CM | POA: Diagnosis not present

## 2020-12-11 DIAGNOSIS — L7632 Postprocedural hematoma of skin and subcutaneous tissue following other procedure: Secondary | ICD-10-CM | POA: Diagnosis not present

## 2020-12-11 DIAGNOSIS — Z170 Estrogen receptor positive status [ER+]: Secondary | ICD-10-CM | POA: Diagnosis not present

## 2020-12-11 DIAGNOSIS — C50412 Malignant neoplasm of upper-outer quadrant of left female breast: Secondary | ICD-10-CM | POA: Diagnosis not present

## 2020-12-11 DIAGNOSIS — C773 Secondary and unspecified malignant neoplasm of axilla and upper limb lymph nodes: Secondary | ICD-10-CM | POA: Diagnosis not present

## 2020-12-11 DIAGNOSIS — C7989 Secondary malignant neoplasm of other specified sites: Secondary | ICD-10-CM | POA: Diagnosis not present

## 2020-12-11 DIAGNOSIS — Z7902 Long term (current) use of antithrombotics/antiplatelets: Secondary | ICD-10-CM | POA: Diagnosis not present

## 2020-12-11 DIAGNOSIS — C50912 Malignant neoplasm of unspecified site of left female breast: Secondary | ICD-10-CM | POA: Diagnosis not present

## 2020-12-15 DIAGNOSIS — C773 Secondary and unspecified malignant neoplasm of axilla and upper limb lymph nodes: Secondary | ICD-10-CM | POA: Diagnosis not present

## 2020-12-15 DIAGNOSIS — C50412 Malignant neoplasm of upper-outer quadrant of left female breast: Secondary | ICD-10-CM | POA: Diagnosis not present

## 2020-12-15 DIAGNOSIS — Z170 Estrogen receptor positive status [ER+]: Secondary | ICD-10-CM | POA: Diagnosis not present

## 2020-12-18 DIAGNOSIS — Z9221 Personal history of antineoplastic chemotherapy: Secondary | ICD-10-CM | POA: Diagnosis not present

## 2020-12-18 DIAGNOSIS — Z9889 Other specified postprocedural states: Secondary | ICD-10-CM | POA: Diagnosis not present

## 2020-12-18 DIAGNOSIS — C50912 Malignant neoplasm of unspecified site of left female breast: Secondary | ICD-10-CM | POA: Diagnosis not present

## 2020-12-18 DIAGNOSIS — Z170 Estrogen receptor positive status [ER+]: Secondary | ICD-10-CM | POA: Diagnosis not present

## 2020-12-20 DIAGNOSIS — Z483 Aftercare following surgery for neoplasm: Secondary | ICD-10-CM | POA: Diagnosis not present

## 2020-12-20 DIAGNOSIS — Z9889 Other specified postprocedural states: Secondary | ICD-10-CM | POA: Diagnosis not present

## 2020-12-20 DIAGNOSIS — C50911 Malignant neoplasm of unspecified site of right female breast: Secondary | ICD-10-CM | POA: Diagnosis not present

## 2020-12-25 DIAGNOSIS — Z9889 Other specified postprocedural states: Secondary | ICD-10-CM | POA: Diagnosis not present

## 2020-12-25 DIAGNOSIS — Z170 Estrogen receptor positive status [ER+]: Secondary | ICD-10-CM | POA: Diagnosis not present

## 2020-12-25 DIAGNOSIS — L905 Scar conditions and fibrosis of skin: Secondary | ICD-10-CM | POA: Diagnosis not present

## 2020-12-25 DIAGNOSIS — C50912 Malignant neoplasm of unspecified site of left female breast: Secondary | ICD-10-CM | POA: Diagnosis not present

## 2020-12-25 DIAGNOSIS — R29898 Other symptoms and signs involving the musculoskeletal system: Secondary | ICD-10-CM | POA: Diagnosis not present

## 2020-12-25 DIAGNOSIS — Z9189 Other specified personal risk factors, not elsewhere classified: Secondary | ICD-10-CM | POA: Diagnosis not present

## 2020-12-25 DIAGNOSIS — N99842 Postprocedural seroma of a genitourinary system organ or structure following a genitourinary system procedure: Secondary | ICD-10-CM | POA: Diagnosis not present

## 2020-12-25 DIAGNOSIS — M25612 Stiffness of left shoulder, not elsewhere classified: Secondary | ICD-10-CM | POA: Diagnosis not present

## 2020-12-25 DIAGNOSIS — L7682 Other postprocedural complications of skin and subcutaneous tissue: Secondary | ICD-10-CM | POA: Diagnosis not present

## 2020-12-28 DIAGNOSIS — D649 Anemia, unspecified: Secondary | ICD-10-CM | POA: Diagnosis not present

## 2020-12-28 DIAGNOSIS — C773 Secondary and unspecified malignant neoplasm of axilla and upper limb lymph nodes: Secondary | ICD-10-CM | POA: Diagnosis not present

## 2020-12-28 DIAGNOSIS — Z803 Family history of malignant neoplasm of breast: Secondary | ICD-10-CM | POA: Diagnosis not present

## 2020-12-28 DIAGNOSIS — I2510 Atherosclerotic heart disease of native coronary artery without angina pectoris: Secondary | ICD-10-CM | POA: Diagnosis not present

## 2020-12-28 DIAGNOSIS — Y710 Diagnostic and monitoring cardiovascular devices associated with adverse incidents: Secondary | ICD-10-CM | POA: Diagnosis not present

## 2020-12-28 DIAGNOSIS — C50412 Malignant neoplasm of upper-outer quadrant of left female breast: Secondary | ICD-10-CM | POA: Diagnosis not present

## 2020-12-28 DIAGNOSIS — Z801 Family history of malignant neoplasm of trachea, bronchus and lung: Secondary | ICD-10-CM | POA: Diagnosis not present

## 2020-12-28 DIAGNOSIS — C50912 Malignant neoplasm of unspecified site of left female breast: Secondary | ICD-10-CM | POA: Diagnosis not present

## 2020-12-28 DIAGNOSIS — Z95828 Presence of other vascular implants and grafts: Secondary | ICD-10-CM | POA: Diagnosis not present

## 2020-12-28 DIAGNOSIS — I252 Old myocardial infarction: Secondary | ICD-10-CM | POA: Diagnosis not present

## 2020-12-28 DIAGNOSIS — Z7902 Long term (current) use of antithrombotics/antiplatelets: Secondary | ICD-10-CM | POA: Diagnosis not present

## 2020-12-28 DIAGNOSIS — I219 Acute myocardial infarction, unspecified: Secondary | ICD-10-CM | POA: Diagnosis not present

## 2020-12-28 DIAGNOSIS — Z87891 Personal history of nicotine dependence: Secondary | ICD-10-CM | POA: Diagnosis not present

## 2020-12-28 DIAGNOSIS — Z79899 Other long term (current) drug therapy: Secondary | ICD-10-CM | POA: Diagnosis not present

## 2020-12-28 DIAGNOSIS — Z5111 Encounter for antineoplastic chemotherapy: Secondary | ICD-10-CM | POA: Diagnosis not present

## 2020-12-28 DIAGNOSIS — Z170 Estrogen receptor positive status [ER+]: Secondary | ICD-10-CM | POA: Diagnosis not present

## 2020-12-28 DIAGNOSIS — G629 Polyneuropathy, unspecified: Secondary | ICD-10-CM | POA: Diagnosis not present

## 2020-12-28 DIAGNOSIS — E538 Deficiency of other specified B group vitamins: Secondary | ICD-10-CM | POA: Diagnosis not present

## 2021-01-01 DIAGNOSIS — C50912 Malignant neoplasm of unspecified site of left female breast: Secondary | ICD-10-CM | POA: Diagnosis not present

## 2021-01-01 DIAGNOSIS — R29898 Other symptoms and signs involving the musculoskeletal system: Secondary | ICD-10-CM | POA: Diagnosis not present

## 2021-01-01 DIAGNOSIS — L905 Scar conditions and fibrosis of skin: Secondary | ICD-10-CM | POA: Diagnosis not present

## 2021-01-01 DIAGNOSIS — M25612 Stiffness of left shoulder, not elsewhere classified: Secondary | ICD-10-CM | POA: Diagnosis not present

## 2021-01-01 DIAGNOSIS — Z9189 Other specified personal risk factors, not elsewhere classified: Secondary | ICD-10-CM | POA: Diagnosis not present

## 2021-01-01 DIAGNOSIS — L7682 Other postprocedural complications of skin and subcutaneous tissue: Secondary | ICD-10-CM | POA: Diagnosis not present

## 2021-01-05 DIAGNOSIS — C50911 Malignant neoplasm of unspecified site of right female breast: Secondary | ICD-10-CM | POA: Diagnosis not present

## 2021-01-05 DIAGNOSIS — R590 Localized enlarged lymph nodes: Secondary | ICD-10-CM | POA: Diagnosis not present

## 2021-01-05 DIAGNOSIS — C50412 Malignant neoplasm of upper-outer quadrant of left female breast: Secondary | ICD-10-CM | POA: Diagnosis not present

## 2021-01-09 DIAGNOSIS — C50911 Malignant neoplasm of unspecified site of right female breast: Secondary | ICD-10-CM | POA: Diagnosis not present

## 2021-01-18 DIAGNOSIS — Z459 Encounter for adjustment and management of unspecified implanted device: Secondary | ICD-10-CM | POA: Diagnosis not present

## 2021-01-18 DIAGNOSIS — Z95828 Presence of other vascular implants and grafts: Secondary | ICD-10-CM | POA: Diagnosis not present

## 2021-01-18 DIAGNOSIS — I252 Old myocardial infarction: Secondary | ICD-10-CM | POA: Diagnosis not present

## 2021-01-18 DIAGNOSIS — L7634 Postprocedural seroma of skin and subcutaneous tissue following other procedure: Secondary | ICD-10-CM | POA: Diagnosis not present

## 2021-01-18 DIAGNOSIS — Z79899 Other long term (current) drug therapy: Secondary | ICD-10-CM | POA: Diagnosis not present

## 2021-01-18 DIAGNOSIS — I119 Hypertensive heart disease without heart failure: Secondary | ICD-10-CM | POA: Diagnosis not present

## 2021-01-18 DIAGNOSIS — Z87891 Personal history of nicotine dependence: Secondary | ICD-10-CM | POA: Diagnosis not present

## 2021-01-18 DIAGNOSIS — I2510 Atherosclerotic heart disease of native coronary artery without angina pectoris: Secondary | ICD-10-CM | POA: Diagnosis not present

## 2021-01-18 DIAGNOSIS — C773 Secondary and unspecified malignant neoplasm of axilla and upper limb lymph nodes: Secondary | ICD-10-CM | POA: Diagnosis not present

## 2021-01-18 DIAGNOSIS — F17210 Nicotine dependence, cigarettes, uncomplicated: Secondary | ICD-10-CM | POA: Diagnosis not present

## 2021-01-18 DIAGNOSIS — C50912 Malignant neoplasm of unspecified site of left female breast: Secondary | ICD-10-CM | POA: Diagnosis not present

## 2021-01-18 DIAGNOSIS — R5381 Other malaise: Secondary | ICD-10-CM | POA: Diagnosis not present

## 2021-01-18 DIAGNOSIS — Z170 Estrogen receptor positive status [ER+]: Secondary | ICD-10-CM | POA: Diagnosis not present

## 2021-01-18 DIAGNOSIS — N6489 Other specified disorders of breast: Secondary | ICD-10-CM | POA: Diagnosis not present

## 2021-01-18 DIAGNOSIS — I739 Peripheral vascular disease, unspecified: Secondary | ICD-10-CM | POA: Diagnosis not present

## 2021-01-18 DIAGNOSIS — Z9889 Other specified postprocedural states: Secondary | ICD-10-CM | POA: Diagnosis not present

## 2021-01-18 DIAGNOSIS — Z5111 Encounter for antineoplastic chemotherapy: Secondary | ICD-10-CM | POA: Diagnosis not present

## 2021-01-18 DIAGNOSIS — G629 Polyneuropathy, unspecified: Secondary | ICD-10-CM | POA: Diagnosis not present

## 2021-01-18 DIAGNOSIS — D649 Anemia, unspecified: Secondary | ICD-10-CM | POA: Diagnosis not present

## 2021-01-18 DIAGNOSIS — E785 Hyperlipidemia, unspecified: Secondary | ICD-10-CM | POA: Diagnosis not present

## 2021-01-18 DIAGNOSIS — E538 Deficiency of other specified B group vitamins: Secondary | ICD-10-CM | POA: Diagnosis not present

## 2021-01-18 DIAGNOSIS — C50412 Malignant neoplasm of upper-outer quadrant of left female breast: Secondary | ICD-10-CM | POA: Diagnosis not present

## 2021-01-18 DIAGNOSIS — Z803 Family history of malignant neoplasm of breast: Secondary | ICD-10-CM | POA: Diagnosis not present

## 2021-01-18 DIAGNOSIS — Z7902 Long term (current) use of antithrombotics/antiplatelets: Secondary | ICD-10-CM | POA: Diagnosis not present

## 2021-01-18 DIAGNOSIS — D630 Anemia in neoplastic disease: Secondary | ICD-10-CM | POA: Diagnosis not present

## 2021-01-19 DIAGNOSIS — C50911 Malignant neoplasm of unspecified site of right female breast: Secondary | ICD-10-CM | POA: Diagnosis not present

## 2021-01-30 DIAGNOSIS — Z79899 Other long term (current) drug therapy: Secondary | ICD-10-CM | POA: Diagnosis not present

## 2021-01-30 DIAGNOSIS — C50412 Malignant neoplasm of upper-outer quadrant of left female breast: Secondary | ICD-10-CM | POA: Diagnosis not present

## 2021-02-08 DIAGNOSIS — F17210 Nicotine dependence, cigarettes, uncomplicated: Secondary | ICD-10-CM | POA: Diagnosis not present

## 2021-02-08 DIAGNOSIS — Z9889 Other specified postprocedural states: Secondary | ICD-10-CM | POA: Diagnosis not present

## 2021-02-08 DIAGNOSIS — Z170 Estrogen receptor positive status [ER+]: Secondary | ICD-10-CM | POA: Diagnosis not present

## 2021-02-08 DIAGNOSIS — C50912 Malignant neoplasm of unspecified site of left female breast: Secondary | ICD-10-CM | POA: Diagnosis not present

## 2021-02-08 DIAGNOSIS — E538 Deficiency of other specified B group vitamins: Secondary | ICD-10-CM | POA: Diagnosis not present

## 2021-02-08 DIAGNOSIS — Z5111 Encounter for antineoplastic chemotherapy: Secondary | ICD-10-CM | POA: Diagnosis not present

## 2021-02-08 DIAGNOSIS — N6489 Other specified disorders of breast: Secondary | ICD-10-CM | POA: Diagnosis not present

## 2021-02-08 DIAGNOSIS — D649 Anemia, unspecified: Secondary | ICD-10-CM | POA: Diagnosis not present

## 2021-02-08 DIAGNOSIS — R609 Edema, unspecified: Secondary | ICD-10-CM | POA: Diagnosis not present

## 2021-02-08 DIAGNOSIS — C50412 Malignant neoplasm of upper-outer quadrant of left female breast: Secondary | ICD-10-CM | POA: Diagnosis not present

## 2021-02-08 DIAGNOSIS — Z803 Family history of malignant neoplasm of breast: Secondary | ICD-10-CM | POA: Diagnosis not present

## 2021-02-08 DIAGNOSIS — G629 Polyneuropathy, unspecified: Secondary | ICD-10-CM | POA: Diagnosis not present

## 2021-02-08 DIAGNOSIS — I2510 Atherosclerotic heart disease of native coronary artery without angina pectoris: Secondary | ICD-10-CM | POA: Diagnosis not present

## 2021-02-08 DIAGNOSIS — Z95828 Presence of other vascular implants and grafts: Secondary | ICD-10-CM | POA: Diagnosis not present

## 2021-02-08 DIAGNOSIS — C773 Secondary and unspecified malignant neoplasm of axilla and upper limb lymph nodes: Secondary | ICD-10-CM | POA: Diagnosis not present

## 2021-02-12 ENCOUNTER — Ambulatory Visit: Payer: PPO | Admitting: Cardiovascular Disease

## 2021-02-13 DIAGNOSIS — Z9889 Other specified postprocedural states: Secondary | ICD-10-CM | POA: Diagnosis not present

## 2021-02-13 DIAGNOSIS — C773 Secondary and unspecified malignant neoplasm of axilla and upper limb lymph nodes: Secondary | ICD-10-CM | POA: Diagnosis not present

## 2021-02-13 DIAGNOSIS — R601 Generalized edema: Secondary | ICD-10-CM | POA: Diagnosis not present

## 2021-02-13 DIAGNOSIS — Z170 Estrogen receptor positive status [ER+]: Secondary | ICD-10-CM | POA: Diagnosis not present

## 2021-02-13 DIAGNOSIS — C50412 Malignant neoplasm of upper-outer quadrant of left female breast: Secondary | ICD-10-CM | POA: Diagnosis not present

## 2021-02-13 DIAGNOSIS — Z23 Encounter for immunization: Secondary | ICD-10-CM | POA: Diagnosis not present

## 2021-02-14 NOTE — Progress Notes (Signed)
Date:  02/22/2021   ID:  [name], DOB 11/29/1950, MRN [id number]  PCP:  Reynold Bowen, MD  Cardiologist:   Johnsie Cancel Electrophysiologist:  None   Evaluation Performed:  Follow-Up Visit  Chief Complaint:  CAD/PVD   History of Present Illness:    70 y.o. . anterior wall MI 1998 Rx with BMS to LAD . Edge restenosis Rx re intervention 2008 Quit smoking since then. Echo done 08/16/13 with EF 55-60% mild MR Has had anemia with polypectomy by Dr Henrene Pastor 2017 and again September 2018. BP tends to run higher in office than at home     Having neuropathic symptoms in legs and especially arms RUE worse  Started with "pinched" nerve Seen by Dr Krista Blue neurology labs ok only Vit D low Symptoms not helped by Vitamin B shots  MRI no cervical root impingement    Seen by Dr Gwenlyn Found for PVD. Has left subclavian stenosis and right CIA stenosis ABI"s > 0.94 bilateally   Unfortunaetly diagnosed with breast cancer. Paraneoplastic symptoms would seem to have been cause of her neuropathy Has had port a cath placed and being Rx at [facility]   She had TTE at [facility] I reviewed EF 50-55% strain -17.7 She has lost a lot of weight    Past Medical History:  Diagnosis Date   ABDOMINAL AORTIC ANEURYSM    Anemia    Arthritis    HANDS   Blood transfusion without reported diagnosis    AUGUST 5TH 2017 x2    CAD    CONGESTIVE HEART FAILURE, SYSTOLIC, CHRONIC    CT, CHEST, ABNORMAL    GERD (gastroesophageal reflux disease)    Heart attack ([place])    x 2   History of colon polyps 01/05/2014   adenomatous polyps   HYPERLIPIDEMIA    HYPERTENSION    ISCHEMIC CARDIOMYOPATHY    Lower GI bleed 01/2014   NONSPECIFIC ABN FINDNG RAD&OTH EXAM BILARY TRCT    Numbness and tingling    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   Paresthesia of both hands    TOBACCO ABUSE    Past Surgical History:  Procedure Laterality Date   bare metal stent placement     left anterior descending artery x 2   COLONOSCOPY N/A  01/05/2014   Procedure: COLONOSCOPY;  Surgeon: Jerene Bears, MD;  Location: [facility];  Service: Endoscopy;  Laterality: N/A;   COLONOSCOPY N/A 01/06/2014   Procedure: COLONOSCOPY;  Surgeon: Jerene Bears, MD;  Location: [facility];  Service: Endoscopy;  Laterality: N/A;   COLONOSCOPY     POLYPECTOMY       Current Meds  Medication Sig   carvedilol (COREG) 6.25 MG tablet TAKE 1 TABLET(6.25 MG) BY MOUTH TWICE DAILY   clopidogrel (PLAVIX) 75 MG tablet TAKE 1 TABLET BY MOUTH EVERY DAY   docusate sodium (COLACE) 100 MG capsule Take 100 mg by mouth 2 (two) times daily.   gabapentin (NEURONTIN) 300 MG capsule Take 1 capsule (300 mg total) by mouth 2 (two) times daily.   iron polysaccharides (NIFEREX) 150 MG capsule Take 150 mg by mouth daily.   losartan-hydrochlorothiazide (HYZAAR) 100-25 MG tablet Take 1 tablet by mouth daily.   pantoprazole (PROTONIX) 20 MG tablet Take 20 mg by mouth daily.   simvastatin (ZOCOR) 40 MG tablet TAKE 1 TABLET(40 MG) BY MOUTH DAILY AT 6 PM   VITAMIN D, CHOLECALCIFEROL, PO Take 1 tablet by mouth 2 (two) times a week.     Allergies:  Patient has no known allergies.   Social History   Tobacco Use   Smoking status: Former    Types: Cigarettes, E-cigarettes    Quit date: 02-Sep-2011    Years since quitting: 9.7   Smokeless tobacco: Never  Vaping Use   Vaping Use: Some days  Substance Use Topics   Alcohol use: Not Currently   Drug use: No     Family Hx: The patient's family history includes Anemia in her sister; Colon polyps in her father and sister; Depression (age of onset: 45) in her father; Diabetes in her maternal grandmother; Heart attack in her father; Lung cancer in her sister; Lymphoma (age of onset: 5) in her mother. There is no history of Colon cancer, Esophageal cancer, Rectal cancer, or Stomach cancer.  ROS:   Please see the history of present illness.     All other systems reviewed and are negative.   Prior CV studies:   The following studies  were reviewed today:  Carotid 03/02/20  AAA [place] 03/02/20 Left CIA velocity 4.6 m/sec   Labs/Other Tests and Data Reviewed:    EKG:  NSR rate 68 normal ECG 08/11/17, 02/22/2021 NSR rate 63 normal   Recent Labs: No results found for requested labs within last 8760 hours.   Recent Lipid Panel Lab Results  Component Value Date/Time   CHOL 161 07/28/2013 12:18 PM   TRIG 78.0 07/28/2013 12:18 PM   HDL 65.30 07/28/2013 12:18 PM   CHOLHDL 2 07/28/2013 12:18 PM   LDLCALC 80 07/28/2013 12:18 PM    Wt Readings from Last 3 Encounters:  02/22/21 47.2 kg  08/02/20 49.9 kg  02/02/20 51.3 kg     Objective:    Vital Signs:  BP (!) 142/82   Pulse 63   Ht '5\' 3"'$  (1.6 m)   Wt 47.2 kg   SpO2 98%   BMI 18.42 kg/m    Affect appropriate Chronically ill white female  HEENT: normal Neck supple with no adenopathy JVP normal right bruits no thyromegaly Lungs clear with no wheezing and good diaphragmatic motion Heart:  S1/S2 no murmur, no rub, gallop or click PMI normal port a cath over right chest  Abdomen: benighn, BS positve, no tenderness, no AAA no bruit.  No HSM or HJR Distal pulses intact with no bruits No edema Neuro non-focal sensory deficits arms/legs  Skin warm and dry No muscular weakness   ASSESSMENT & PLAN:    CAD: Stable with no angina and good activity level.  Continue medical Rx  LAD stent in 43 with re intervention 02-Sep-2006 low dose beta blocker due to bradycardia    HTN: Well controlled.  Continue current medications and low sodium Dash type diet.  Home readings normal white coat component    HLD:  On statin labs with primary   Anemia:  On iron improved no bleeding  02/19/16 EGD normal colonoscopy with removal of 8 polyps Dr Henrene Pastor did repeat Colonoscopy 02/13/18 and removed 2 more polyps    PVD: f/u Dr Gwenlyn Found moderate LLE dx and left subclavian stenosis without arm weakness Duplex 03/01/20 no ICA Stenosis Left subclavian stenosis but antegrade vertebral flow  Peak velocity  3.55 m/sec    Depression: Sister died 2016/09/01  husband with DCM and renal cell cancer PRN valium    Neuropathy:  F/u neurology lab work and MRI's unrevealing to date 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  The importance of social distancing was discussed today.   Medication Adjustments/Labs and Tests Ordered: Current medicines are reviewed at length with the patient today.  Concerns regarding medicines are outlined above.   Tests Ordered:  Carotid and LE ABI's   Medication Changes:  None   Disposition:  Follow up with Dr Gwenlyn Found now and me in a year   Signed, Jenkins Rouge, MD  02/22/2021 8:27 AM    Colon

## 2021-02-19 DIAGNOSIS — Z170 Estrogen receptor positive status [ER+]: Secondary | ICD-10-CM | POA: Diagnosis not present

## 2021-02-19 DIAGNOSIS — Z5181 Encounter for therapeutic drug level monitoring: Secondary | ICD-10-CM | POA: Diagnosis not present

## 2021-02-19 DIAGNOSIS — C50919 Malignant neoplasm of unspecified site of unspecified female breast: Secondary | ICD-10-CM | POA: Diagnosis not present

## 2021-02-19 DIAGNOSIS — Z79899 Other long term (current) drug therapy: Secondary | ICD-10-CM | POA: Diagnosis not present

## 2021-02-19 DIAGNOSIS — C50912 Malignant neoplasm of unspecified site of left female breast: Secondary | ICD-10-CM | POA: Diagnosis not present

## 2021-02-19 DIAGNOSIS — I350 Nonrheumatic aortic (valve) stenosis: Secondary | ICD-10-CM | POA: Diagnosis not present

## 2021-02-20 DIAGNOSIS — C50812 Malignant neoplasm of overlapping sites of left female breast: Secondary | ICD-10-CM | POA: Diagnosis not present

## 2021-02-20 DIAGNOSIS — C50912 Malignant neoplasm of unspecified site of left female breast: Secondary | ICD-10-CM | POA: Diagnosis not present

## 2021-02-20 DIAGNOSIS — Z170 Estrogen receptor positive status [ER+]: Secondary | ICD-10-CM | POA: Diagnosis not present

## 2021-02-22 ENCOUNTER — Ambulatory Visit: Payer: PPO | Admitting: Cardiovascular Disease

## 2021-02-22 ENCOUNTER — Other Ambulatory Visit: Payer: Self-pay

## 2021-02-22 ENCOUNTER — Encounter: Payer: Self-pay | Admitting: Cardiovascular Disease

## 2021-02-22 VITALS — BP 142/82 | HR 63 | Ht 63.0 in | Wt 104.0 lb

## 2021-02-22 DIAGNOSIS — I739 Peripheral vascular disease, unspecified: Secondary | ICD-10-CM | POA: Diagnosis not present

## 2021-02-22 DIAGNOSIS — C50011 Malignant neoplasm of nipple and areola, right female breast: Secondary | ICD-10-CM

## 2021-02-22 DIAGNOSIS — C50012 Malignant neoplasm of nipple and areola, left female breast: Secondary | ICD-10-CM

## 2021-02-22 DIAGNOSIS — Z170 Estrogen receptor positive status [ER+]: Secondary | ICD-10-CM

## 2021-02-22 DIAGNOSIS — I771 Stricture of artery: Secondary | ICD-10-CM | POA: Diagnosis not present

## 2021-02-22 DIAGNOSIS — I2510 Atherosclerotic heart disease of native coronary artery without angina pectoris: Secondary | ICD-10-CM | POA: Diagnosis not present

## 2021-02-22 NOTE — Patient Instructions (Signed)
Medication Instructions:  Your physician recommends that you continue on your current medications as directed. Please refer to the Current Medication list given to you today.  *If you need a refill on your cardiac medications before your next appointment, please call your pharmacy*  Lab Work: If you have labs (blood work) drawn today and your tests are completely normal, you will receive your results only by: [place] (if you have MyChart) OR A paper copy in the mail If you have any lab test that is abnormal or we need to change your treatment, we will call you to review the results.  Follow-Up: At [facility], you and your health needs are our priority.  As part of our continuing mission to provide you with exceptional heart care, we have created designated Provider Care Teams.  These Care Teams include your primary Cardiologist (physician) and Advanced Practice Providers (APPs -  Physician Assistants and Nurse Practitioners) who all work together to provide you with the care you need, when you need it.  We recommend signing up for the patient portal called "MyChart".  Sign up information is provided on this After Visit Summary.  MyChart is used to connect with patients for Virtual Visits (Telemedicine).  Patients are able to view lab/test results, encounter notes, upcoming appointments, etc.  Non-urgent messages can be sent to your provider as well.   To learn more about what you can do with MyChart, go to [email or web address].    Your next appointment:   6 month(s)  The format for your next appointment:   In Person  Provider:   You may see Jenkins Rouge, MD or one of the following Advanced Practice Providers on your designated Care Team:   Cecilie Kicks, NP

## 2021-02-27 DIAGNOSIS — C50912 Malignant neoplasm of unspecified site of left female breast: Secondary | ICD-10-CM | POA: Diagnosis not present

## 2021-03-01 DIAGNOSIS — Z5111 Encounter for antineoplastic chemotherapy: Secondary | ICD-10-CM | POA: Diagnosis not present

## 2021-03-01 DIAGNOSIS — Z95828 Presence of other vascular implants and grafts: Secondary | ICD-10-CM | POA: Diagnosis not present

## 2021-03-01 DIAGNOSIS — Z170 Estrogen receptor positive status [ER+]: Secondary | ICD-10-CM | POA: Diagnosis not present

## 2021-03-01 DIAGNOSIS — C50412 Malignant neoplasm of upper-outer quadrant of left female breast: Secondary | ICD-10-CM | POA: Diagnosis not present

## 2021-03-01 DIAGNOSIS — C773 Secondary and unspecified malignant neoplasm of axilla and upper limb lymph nodes: Secondary | ICD-10-CM | POA: Diagnosis not present

## 2021-03-01 DIAGNOSIS — E538 Deficiency of other specified B group vitamins: Secondary | ICD-10-CM | POA: Diagnosis not present

## 2021-03-05 ENCOUNTER — Ambulatory Visit (HOSPITAL_COMMUNITY)
Admission: RE | Admit: 2021-03-05 | Discharge: 2021-03-05 | Disposition: A | Discharge status: Home / Self Care | Payer: PPO | Source: Ambulatory Visit | Attending: Cardiology | Admitting: Cardiology

## 2021-03-05 ENCOUNTER — Ambulatory Visit (HOSPITAL_BASED_OUTPATIENT_CLINIC_OR_DEPARTMENT_OTHER)
Admission: RE | Admit: 2021-03-05 | Discharge: 2021-03-05 | Disposition: A | Discharge status: Home / Self Care | Payer: PPO | Source: Ambulatory Visit | Attending: Cardiovascular Disease | Admitting: Cardiovascular Disease

## 2021-03-05 ENCOUNTER — Other Ambulatory Visit: Payer: Self-pay

## 2021-03-05 DIAGNOSIS — I6523 Occlusion and stenosis of bilateral carotid arteries: Secondary | ICD-10-CM | POA: Insufficient documentation

## 2021-03-05 DIAGNOSIS — R0989 Other specified symptoms and signs involving the circulatory and respiratory systems: Secondary | ICD-10-CM

## 2021-03-05 DIAGNOSIS — I2510 Atherosclerotic heart disease of native coronary artery without angina pectoris: Secondary | ICD-10-CM

## 2021-03-05 DIAGNOSIS — I739 Peripheral vascular disease, unspecified: Secondary | ICD-10-CM

## 2021-03-05 DIAGNOSIS — I2583 Coronary atherosclerosis due to lipid rich plaque: Secondary | ICD-10-CM | POA: Diagnosis not present

## 2021-03-06 DIAGNOSIS — Z510 Encounter for antineoplastic radiation therapy: Secondary | ICD-10-CM | POA: Diagnosis not present

## 2021-03-06 DIAGNOSIS — C50912 Malignant neoplasm of unspecified site of left female breast: Secondary | ICD-10-CM | POA: Diagnosis not present

## 2021-03-07 ENCOUNTER — Encounter (HOSPITAL_BASED_OUTPATIENT_CLINIC_OR_DEPARTMENT_OTHER): Payer: Self-pay

## 2021-03-07 ENCOUNTER — Emergency Department (HOSPITAL_BASED_OUTPATIENT_CLINIC_OR_DEPARTMENT_OTHER)
Admission: EM | Admit: 2021-03-07 | Discharge: 2021-03-07 | Disposition: A | Discharge status: Home / Self Care | Payer: PPO | Attending: Emergency Medicine | Admitting: Emergency Medicine

## 2021-03-07 ENCOUNTER — Other Ambulatory Visit: Payer: Self-pay

## 2021-03-07 DIAGNOSIS — I110 Hypertensive heart disease with heart failure: Secondary | ICD-10-CM | POA: Diagnosis not present

## 2021-03-07 DIAGNOSIS — Z87891 Personal history of nicotine dependence: Secondary | ICD-10-CM | POA: Insufficient documentation

## 2021-03-07 DIAGNOSIS — I2510 Atherosclerotic heart disease of native coronary artery without angina pectoris: Secondary | ICD-10-CM | POA: Insufficient documentation

## 2021-03-07 DIAGNOSIS — I5022 Chronic systolic (congestive) heart failure: Secondary | ICD-10-CM | POA: Diagnosis not present

## 2021-03-07 DIAGNOSIS — Z7902 Long term (current) use of antithrombotics/antiplatelets: Secondary | ICD-10-CM | POA: Insufficient documentation

## 2021-03-07 DIAGNOSIS — R040 Epistaxis: Secondary | ICD-10-CM

## 2021-03-07 DIAGNOSIS — Z79899 Other long term (current) drug therapy: Secondary | ICD-10-CM | POA: Insufficient documentation

## 2021-03-07 LAB — CBC
HCT: 32.6 % — ABNORMAL LOW (ref 36.0–46.0)
Hemoglobin: 10.9 g/dL — ABNORMAL LOW (ref 12.0–15.0)
MCH: 31.1 pg (ref 26.0–34.0)
MCHC: 33.4 g/dL (ref 30.0–36.0)
MCV: 93.1 fL (ref 80.0–100.0)
Platelets: 76 10*3/uL — ABNORMAL LOW (ref 150–400)
RBC: 3.5 MIL/uL — ABNORMAL LOW (ref 3.87–5.11)
RDW: 13.9 % (ref 11.5–15.5)
WBC: 3.7 10*3/uL — ABNORMAL LOW (ref 4.0–10.5)
nRBC: 0 % (ref 0.0–0.2)

## 2021-03-07 LAB — PROTIME-INR
INR: 0.9 (ref 0.8–1.2)
Prothrombin Time: 12.3 seconds (ref 11.4–15.2)

## 2021-03-07 MED ORDER — OXYMETAZOLINE HCL 0.05 % NA SOLN
1.0000 | Freq: Once | NASAL | Status: AC
  Administered 2021-03-07: 1 via NASAL
  Filled 2021-03-07: qty 30

## 2021-03-07 MED ORDER — TRANEXAMIC ACID 1000 MG/10ML IV SOLN
500.0000 mg | Freq: Once | INTRAVENOUS | Status: AC
  Administered 2021-03-07: 500 mg via TOPICAL
  Filled 2021-03-07: qty 10

## 2021-03-07 MED ORDER — LIDOCAINE-EPINEPHRINE (PF) 2 %-1:200000 IJ SOLN
10.0000 mL | Freq: Once | INTRAMUSCULAR | Status: AC
  Administered 2021-03-07: 10 mL via INTRADERMAL
  Filled 2021-03-07: qty 20

## 2021-03-07 MED ORDER — OXYMETAZOLINE HCL 0.05 % NA SOLN: 1.0000 | Freq: Once | NASAL | Status: DC

## 2021-03-07 MED ORDER — SILVER NITRATE-POT NITRATE 75-25 % EX MISC
CUTANEOUS | Status: AC
  Filled 2021-03-07: qty 10

## 2021-03-07 NOTE — ED Provider Notes (Signed)
[facility] Provider Note   CSN: [id number] Arrival date & time: 03/07/21  1721     History Chief Complaint  Patient presents with   Epistaxis    TIFFANEY HEIMANN is a 70 y.o. female.  70 yo F with a chief complaints of left-sided epistaxis.  This started earlier today.  Has persisted despite direct pressure.  She is on Plavix.  Denies trauma to the nose.  Feels like it still bleeding currently.  The history is provided by the patient.  Epistaxis Location:  L nare Severity:  Moderate Duration:  6 hours Timing:  Constant Progression:  Partially resolved Chronicity:  New Context comment:  Plavix Relieved by:  Nothing Worsened by:  Nothing Ineffective treatments:  None tried Associated symptoms: no congestion, no dizziness, no fever and no headaches       Past Medical History:  Diagnosis Date   ABDOMINAL AORTIC ANEURYSM    Anemia    Arthritis    HANDS   Blood transfusion without reported diagnosis    AUGUST 5TH 2017 x2    CAD    CONGESTIVE HEART FAILURE, SYSTOLIC, CHRONIC    CT, CHEST, ABNORMAL    GERD (gastroesophageal reflux disease)    Heart attack ([place])    x 2   History of colon polyps 01/05/2014   adenomatous polyps   HYPERLIPIDEMIA    HYPERTENSION    ISCHEMIC CARDIOMYOPATHY    Lower GI bleed 01/2014   NONSPECIFIC ABN FINDNG RAD&OTH EXAM BILARY TRCT    Numbness and tingling    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   Paresthesia of both hands    TOBACCO ABUSE     Patient Active Problem List   Diagnosis Date Noted   Peripheral arterial disease ([place]) 04/26/2019   Stenosis of left subclavian artery ([place]) 03/12/2019   Paresthesia 07/13/2018   Gait abnormality 07/13/2018   Symptomatic anemia 01/16/2016   Left carotid bruit 01/16/2016   History of colonic polyps 04/20/2014   Chronic anticoagulation 04/20/2014   Multiple adenomatous polyps 01/24/2014   Lower GI bleed 01/05/2014   Post-polypectomy bleeding 01/05/2014    NONSPECIFIC ABN FINDNG RAD&OTH EXAM BILARY TRCT 11/09/2008   Nonspecific (abnormal) findings on radiological and other examination of body structure 11/01/2008   CT, CHEST, ABNORMAL 11/01/2008   ABDOMINAL AORTIC ANEURYSM 10/26/2008   Elevated lipids 10/25/2008   TOBACCO ABUSE 10/25/2008   Essential hypertension 10/25/2008   Coronary atherosclerosis 10/25/2008   ISCHEMIC CARDIOMYOPATHY 10/25/2008   Congestive heart failure ([place]) 10/25/2008    Past Surgical History:  Procedure Laterality Date   bare metal stent placement     left anterior descending artery x 2   COLONOSCOPY N/A 01/05/2014   Procedure: COLONOSCOPY;  Surgeon: Jerene Bears, MD;  Location: [facility];  Service: Endoscopy;  Laterality: N/A;   COLONOSCOPY N/A 01/06/2014   Procedure: COLONOSCOPY;  Surgeon: Jerene Bears, MD;  Location: [facility];  Service: Endoscopy;  Laterality: N/A;   COLONOSCOPY     POLYPECTOMY       OB History   No obstetric history on file.     Family History  Problem Relation Age of Onset   Lymphoma Mother 87   Colon polyps Father    Depression Father 14       died of suicide   Heart attack Father    Diabetes Maternal Grandmother    Anemia Sister    Colon polyps Sister    Lung cancer Sister  Colon cancer Neg Hx    Esophageal cancer Neg Hx    Rectal cancer Neg Hx    Stomach cancer Neg Hx     Social History   Tobacco Use   Smoking status: Former    Types: Cigarettes, E-cigarettes    Quit date: 2013    Years since quitting: 9.7    Passive exposure: Never   Smokeless tobacco: Never  Vaping Use   Vaping Use: Some days  Substance Use Topics   Alcohol use: Not Currently   Drug use: No    Home Medications Prior to Admission medications   Medication Sig Start Date End Date Taking? Authorizing Provider  carvedilol (COREG) 6.25 MG tablet TAKE 1 TABLET(6.25 MG) BY MOUTH TWICE DAILY 06/26/20  Yes Josue Hector, MD  clopidogrel (PLAVIX) 75 MG tablet TAKE 1 TABLET BY MOUTH EVERY  DAY 09/25/20  Yes Josue Hector, MD  gabapentin (NEURONTIN) 300 MG capsule Take 1 capsule (300 mg total) by mouth 2 (two) times daily. 01/17/20  Yes Patel, Donika K, DO  losartan-hydrochlorothiazide (HYZAAR) 100-25 MG tablet Take 1 tablet by mouth daily. 02/20/17  Yes Josue Hector, MD  pantoprazole (PROTONIX) 20 MG tablet Take 20 mg by mouth daily. 12/21/19  Yes [provider]  simvastatin (ZOCOR) 40 MG tablet TAKE 1 TABLET(40 MG) BY MOUTH DAILY AT 6 PM 06/26/20  Yes Josue Hector, MD  VITAMIN D, CHOLECALCIFEROL, PO Take 1 tablet by mouth 2 (two) times a week.   Yes [provider]  docusate sodium (COLACE) 100 MG capsule Take 100 mg by mouth 2 (two) times daily.    [provider]  iron polysaccharides (NIFEREX) 150 MG capsule Take 150 mg by mouth daily.    [provider]    Allergies    Patient has no known allergies.  Review of Systems   Review of Systems  Constitutional:  Negative for chills and fever.  HENT:  Positive for nosebleeds. Negative for congestion and rhinorrhea.   Eyes:  Negative for redness and visual disturbance.  Respiratory:  Negative for shortness of breath and wheezing.   Cardiovascular:  Negative for chest pain and palpitations.  Gastrointestinal:  Negative for nausea and vomiting.  Genitourinary:  Negative for dysuria and urgency.  Musculoskeletal:  Negative for arthralgias and myalgias.  Skin:  Negative for pallor and wound.  Neurological:  Negative for dizziness and headaches.   Physical Exam Updated Vital Signs BP (!) 186/88 (BP Location: Left Arm)   Pulse 67   Temp 97.7 F (36.5 C) (Oral)   Resp 16   Ht 5\' 3"  (1.6 m)   Wt 47.2 kg   SpO2 95%   BMI 18.42 kg/m   Physical Exam Vitals and nursing note reviewed.  Constitutional:      General: She is not in acute distress.    Appearance: She is well-developed. She is not diaphoretic.  HENT:     Head: Normocephalic and atraumatic.     Nose:     Comments: Blood  in the left naris.  No obvious signs of trauma.  Eyes:     Pupils: Pupils are equal, round, and reactive to light.  Cardiovascular:     Rate and Rhythm: Normal rate and regular rhythm.     Heart sounds: No murmur heard.   No friction rub. No gallop.  Pulmonary:     Effort: Pulmonary effort is normal.     Breath sounds: No wheezing or rales.  Abdominal:  General: There is no distension.     Palpations: Abdomen is soft.     Tenderness: There is no abdominal tenderness.  Musculoskeletal:        General: No tenderness.     Cervical back: Normal range of motion and neck supple.  Skin:    General: Skin is warm and dry.  Neurological:     Mental Status: She is alert and oriented to person, place, and time.  Psychiatric:        Behavior: Behavior normal.    ED Results / Procedures / Treatments   Labs (all labs ordered are listed, but only abnormal results are displayed) Labs Reviewed  CBC - Abnormal; Notable for the following components:      Result Value   WBC 3.7 (*)    RBC 3.50 (*)    Hemoglobin 10.9 (*)    HCT 32.6 (*)    Platelets 76 (*)    All other components within normal limits  PROTIME-INR    EKG None  Radiology No results found.  Procedures .Epistaxis Management  Date/Time: 03/07/2021 9:30 PM Performed by: Deno Etienne, DO Authorized by: Deno Etienne, DO   Consent:    Consent obtained:  Verbal   Consent given by:  Patient   Risks, benefits, and alternatives were discussed: yes     Risks discussed:  Bleeding, infection and nasal injury   Alternatives discussed:  No treatment and delayed treatment Universal protocol:    Procedure explained and questions answered to patient or proxy's satisfaction: yes     Patient identity confirmed:  Verbally with patient Anesthesia:    Anesthesia method:  Topical application   Topical anesthesia: lido with epi. Procedure details:    Treatment site:  L anterior   Treatment method:  Silver nitrate   Treatment  complexity:  Limited   Treatment episode: initial   Post-procedure details:    Assessment:  Bleeding stopped   Procedure completion:  Tolerated well, no immediate complications   Medications Ordered in ED Medications  oxymetazoline (AFRIN) 0.05 % nasal spray 1 spray (1 spray Each Nare Given 03/07/21 1745)  lidocaine-EPINEPHrine (XYLOCAINE W/EPI) 2 %-1:200000 (PF) injection 10 mL (10 mLs Intradermal Given 03/07/21 2046)  tranexamic acid (CYKLOKAPRON) injection 500 mg (500 mg Topical Given 03/07/21 2046)  silver nitrate applicators 82-50 % applicator ( Each Nare Given 03/07/21 2055)    ED Course  I have reviewed the triage vital signs and the nursing notes.  Pertinent labs & imaging results that were available during my care of the patient were reviewed by me and considered in my medical decision making (see chart for details).    MDM Rules/Calculators/A&P                           71 yo F with a chief complaint of left-sided epistaxis.  Started this morning.  Hemoglobin slightly lower than what appears to be her baseline.  Still having some bleeding on reassessment after placement of nasal clips upon arrival by triage.  We will obtain topical medicines hold direct pressure reassess.  After 15 minutes the patient's bleeding has stopped.  She does have increased vascularity in the Kiesselbach's plexus and so silver nitrate was applied.  Patient was observed in the ED for about 30 minutes post without recurrent bleeding.  We will have her follow-up with her family doctor.  Given ENT follow-up if needed.  9:31 PM:  I have discussed the diagnosis/risks/treatment options  with the patient and family and believe the pt to be eligible for discharge home to follow-up with ENT, PCP. We also discussed returning to the ED immediately if new or worsening sx occur. We discussed the sx which are most concerning (e.g., sudden worsening pain, fever, inability to tolerate by mouth) that necessitate immediate  return. Medications administered to the patient during their visit and any new prescriptions provided to the patient are listed below.  Medications given during this visit Medications  oxymetazoline (AFRIN) 0.05 % nasal spray 1 spray (1 spray Each Nare Given 03/07/21 1745)  lidocaine-EPINEPHrine (XYLOCAINE W/EPI) 2 %-1:200000 (PF) injection 10 mL (10 mLs Intradermal Given 03/07/21 2046)  tranexamic acid (CYKLOKAPRON) injection 500 mg (500 mg Topical Given 03/07/21 2046)  silver nitrate applicators 22-17 % applicator ( Each Nare Given 03/07/21 2055)     The patient appears reasonably screen and/or stabilized for discharge and I doubt any other medical condition or other [facility] requiring further screening, evaluation, or treatment in the ED at this time prior to discharge.   Final Clinical Impression(s) / ED Diagnoses Final diagnoses:  Epistaxis    Rx / DC Orders ED Discharge Orders     None        Deno Etienne, DO 03/07/21 2131

## 2021-03-07 NOTE — ED Triage Notes (Signed)
Pt states onset of nosebleed w/clots from left nare today at approx 1545; denies headache, denies lightheadedness; on blood Plavix.

## 2021-03-07 NOTE — Discharge Instructions (Signed)
Try to keep this area moist usually I will have you put a little bit of Vaseline or antibiotic ointment at the tip of your small finger and rub it just on the inside of your nose.  You could try and use a humidifier in your bedroom.  Please return for bleeding that does not improve with direct pressure for 15 minutes.  If this continues off and on and then you should follow-up with ENT to have it further evaluated.

## 2021-03-08 DIAGNOSIS — Y999 Unspecified external cause status: Secondary | ICD-10-CM | POA: Diagnosis not present

## 2021-03-08 DIAGNOSIS — D72819 Decreased white blood cell count, unspecified: Secondary | ICD-10-CM | POA: Diagnosis not present

## 2021-03-08 DIAGNOSIS — C50912 Malignant neoplasm of unspecified site of left female breast: Secondary | ICD-10-CM | POA: Diagnosis not present

## 2021-03-08 DIAGNOSIS — W228XXA Striking against or struck by other objects, initial encounter: Secondary | ICD-10-CM | POA: Diagnosis not present

## 2021-03-08 DIAGNOSIS — S0990XA Unspecified injury of head, initial encounter: Secondary | ICD-10-CM | POA: Diagnosis not present

## 2021-03-08 DIAGNOSIS — Z043 Encounter for examination and observation following other accident: Secondary | ICD-10-CM | POA: Diagnosis not present

## 2021-03-08 DIAGNOSIS — Z510 Encounter for antineoplastic radiation therapy: Secondary | ICD-10-CM | POA: Diagnosis not present

## 2021-03-08 DIAGNOSIS — D696 Thrombocytopenia, unspecified: Secondary | ICD-10-CM | POA: Diagnosis not present

## 2021-03-08 DIAGNOSIS — I4510 Unspecified right bundle-branch block: Secondary | ICD-10-CM | POA: Diagnosis not present

## 2021-03-08 DIAGNOSIS — S0083XA Contusion of other part of head, initial encounter: Secondary | ICD-10-CM | POA: Diagnosis not present

## 2021-03-08 DIAGNOSIS — S40021A Contusion of right upper arm, initial encounter: Secondary | ICD-10-CM | POA: Diagnosis not present

## 2021-03-09 DIAGNOSIS — C50912 Malignant neoplasm of unspecified site of left female breast: Secondary | ICD-10-CM | POA: Diagnosis not present

## 2021-03-09 DIAGNOSIS — Z510 Encounter for antineoplastic radiation therapy: Secondary | ICD-10-CM | POA: Diagnosis not present

## 2021-03-12 DIAGNOSIS — Z510 Encounter for antineoplastic radiation therapy: Secondary | ICD-10-CM | POA: Diagnosis not present

## 2021-03-12 DIAGNOSIS — C50912 Malignant neoplasm of unspecified site of left female breast: Secondary | ICD-10-CM | POA: Diagnosis not present

## 2021-03-13 DIAGNOSIS — C50912 Malignant neoplasm of unspecified site of left female breast: Secondary | ICD-10-CM | POA: Diagnosis not present

## 2021-03-13 DIAGNOSIS — Z510 Encounter for antineoplastic radiation therapy: Secondary | ICD-10-CM | POA: Diagnosis not present

## 2021-03-15 DIAGNOSIS — C50912 Malignant neoplasm of unspecified site of left female breast: Secondary | ICD-10-CM | POA: Diagnosis not present

## 2021-03-15 DIAGNOSIS — Z510 Encounter for antineoplastic radiation therapy: Secondary | ICD-10-CM | POA: Diagnosis not present

## 2021-03-16 DIAGNOSIS — C50912 Malignant neoplasm of unspecified site of left female breast: Secondary | ICD-10-CM | POA: Diagnosis not present

## 2021-03-16 DIAGNOSIS — Z510 Encounter for antineoplastic radiation therapy: Secondary | ICD-10-CM | POA: Diagnosis not present

## 2021-03-19 DIAGNOSIS — Z510 Encounter for antineoplastic radiation therapy: Secondary | ICD-10-CM | POA: Diagnosis not present

## 2021-03-19 DIAGNOSIS — C50912 Malignant neoplasm of unspecified site of left female breast: Secondary | ICD-10-CM | POA: Diagnosis not present

## 2021-03-20 DIAGNOSIS — C50912 Malignant neoplasm of unspecified site of left female breast: Secondary | ICD-10-CM | POA: Diagnosis not present

## 2021-03-20 DIAGNOSIS — E538 Deficiency of other specified B group vitamins: Secondary | ICD-10-CM | POA: Diagnosis not present

## 2021-03-20 DIAGNOSIS — C50412 Malignant neoplasm of upper-outer quadrant of left female breast: Secondary | ICD-10-CM | POA: Diagnosis not present

## 2021-03-20 DIAGNOSIS — R609 Edema, unspecified: Secondary | ICD-10-CM | POA: Diagnosis not present

## 2021-03-20 DIAGNOSIS — G629 Polyneuropathy, unspecified: Secondary | ICD-10-CM | POA: Diagnosis not present

## 2021-03-20 DIAGNOSIS — Z79899 Other long term (current) drug therapy: Secondary | ICD-10-CM | POA: Diagnosis not present

## 2021-03-20 DIAGNOSIS — C773 Secondary and unspecified malignant neoplasm of axilla and upper limb lymph nodes: Secondary | ICD-10-CM | POA: Diagnosis not present

## 2021-03-20 DIAGNOSIS — I2510 Atherosclerotic heart disease of native coronary artery without angina pectoris: Secondary | ICD-10-CM | POA: Diagnosis not present

## 2021-03-20 DIAGNOSIS — Z510 Encounter for antineoplastic radiation therapy: Secondary | ICD-10-CM | POA: Diagnosis not present

## 2021-03-20 DIAGNOSIS — D649 Anemia, unspecified: Secondary | ICD-10-CM | POA: Diagnosis not present

## 2021-03-20 DIAGNOSIS — E785 Hyperlipidemia, unspecified: Secondary | ICD-10-CM | POA: Diagnosis not present

## 2021-03-20 DIAGNOSIS — I739 Peripheral vascular disease, unspecified: Secondary | ICD-10-CM | POA: Diagnosis not present

## 2021-03-20 DIAGNOSIS — I119 Hypertensive heart disease without heart failure: Secondary | ICD-10-CM | POA: Diagnosis not present

## 2021-03-20 DIAGNOSIS — F17210 Nicotine dependence, cigarettes, uncomplicated: Secondary | ICD-10-CM | POA: Diagnosis not present

## 2021-03-20 DIAGNOSIS — Z5111 Encounter for antineoplastic chemotherapy: Secondary | ICD-10-CM | POA: Diagnosis not present

## 2021-03-20 DIAGNOSIS — Z170 Estrogen receptor positive status [ER+]: Secondary | ICD-10-CM | POA: Diagnosis not present

## 2021-03-20 DIAGNOSIS — I252 Old myocardial infarction: Secondary | ICD-10-CM | POA: Diagnosis not present

## 2021-03-20 DIAGNOSIS — D630 Anemia in neoplastic disease: Secondary | ICD-10-CM | POA: Diagnosis not present

## 2021-03-20 DIAGNOSIS — Z95828 Presence of other vascular implants and grafts: Secondary | ICD-10-CM | POA: Diagnosis not present

## 2021-03-20 DIAGNOSIS — N6489 Other specified disorders of breast: Secondary | ICD-10-CM | POA: Diagnosis not present

## 2021-03-21 DIAGNOSIS — C50912 Malignant neoplasm of unspecified site of left female breast: Secondary | ICD-10-CM | POA: Diagnosis not present

## 2021-03-21 DIAGNOSIS — Z510 Encounter for antineoplastic radiation therapy: Secondary | ICD-10-CM | POA: Diagnosis not present

## 2021-03-22 DIAGNOSIS — Z510 Encounter for antineoplastic radiation therapy: Secondary | ICD-10-CM | POA: Diagnosis not present

## 2021-03-22 DIAGNOSIS — E785 Hyperlipidemia, unspecified: Secondary | ICD-10-CM | POA: Diagnosis not present

## 2021-03-22 DIAGNOSIS — I2510 Atherosclerotic heart disease of native coronary artery without angina pectoris: Secondary | ICD-10-CM | POA: Diagnosis not present

## 2021-03-22 DIAGNOSIS — D630 Anemia in neoplastic disease: Secondary | ICD-10-CM | POA: Diagnosis not present

## 2021-03-22 DIAGNOSIS — Z79899 Other long term (current) drug therapy: Secondary | ICD-10-CM | POA: Diagnosis not present

## 2021-03-22 DIAGNOSIS — D649 Anemia, unspecified: Secondary | ICD-10-CM | POA: Diagnosis not present

## 2021-03-22 DIAGNOSIS — G629 Polyneuropathy, unspecified: Secondary | ICD-10-CM | POA: Diagnosis not present

## 2021-03-22 DIAGNOSIS — C50912 Malignant neoplasm of unspecified site of left female breast: Secondary | ICD-10-CM | POA: Diagnosis not present

## 2021-03-22 DIAGNOSIS — I739 Peripheral vascular disease, unspecified: Secondary | ICD-10-CM | POA: Diagnosis not present

## 2021-03-22 DIAGNOSIS — E538 Deficiency of other specified B group vitamins: Secondary | ICD-10-CM | POA: Diagnosis not present

## 2021-03-22 DIAGNOSIS — I119 Hypertensive heart disease without heart failure: Secondary | ICD-10-CM | POA: Diagnosis not present

## 2021-03-22 DIAGNOSIS — I252 Old myocardial infarction: Secondary | ICD-10-CM | POA: Diagnosis not present

## 2021-03-22 DIAGNOSIS — Z5111 Encounter for antineoplastic chemotherapy: Secondary | ICD-10-CM | POA: Diagnosis not present

## 2021-03-22 DIAGNOSIS — C773 Secondary and unspecified malignant neoplasm of axilla and upper limb lymph nodes: Secondary | ICD-10-CM | POA: Diagnosis not present

## 2021-03-22 DIAGNOSIS — C50412 Malignant neoplasm of upper-outer quadrant of left female breast: Secondary | ICD-10-CM | POA: Diagnosis not present

## 2021-03-22 DIAGNOSIS — Z95828 Presence of other vascular implants and grafts: Secondary | ICD-10-CM | POA: Diagnosis not present

## 2021-03-22 DIAGNOSIS — Z170 Estrogen receptor positive status [ER+]: Secondary | ICD-10-CM | POA: Diagnosis not present

## 2021-03-22 DIAGNOSIS — R609 Edema, unspecified: Secondary | ICD-10-CM | POA: Diagnosis not present

## 2021-03-22 DIAGNOSIS — F17210 Nicotine dependence, cigarettes, uncomplicated: Secondary | ICD-10-CM | POA: Diagnosis not present

## 2021-03-23 DIAGNOSIS — Z510 Encounter for antineoplastic radiation therapy: Secondary | ICD-10-CM | POA: Diagnosis not present

## 2021-03-23 DIAGNOSIS — C50912 Malignant neoplasm of unspecified site of left female breast: Secondary | ICD-10-CM | POA: Diagnosis not present

## 2021-03-26 DIAGNOSIS — Z510 Encounter for antineoplastic radiation therapy: Secondary | ICD-10-CM | POA: Diagnosis not present

## 2021-03-26 DIAGNOSIS — C50912 Malignant neoplasm of unspecified site of left female breast: Secondary | ICD-10-CM | POA: Diagnosis not present

## 2021-03-27 DIAGNOSIS — Z510 Encounter for antineoplastic radiation therapy: Secondary | ICD-10-CM | POA: Diagnosis not present

## 2021-03-27 DIAGNOSIS — C50912 Malignant neoplasm of unspecified site of left female breast: Secondary | ICD-10-CM | POA: Diagnosis not present

## 2021-03-28 DIAGNOSIS — C50912 Malignant neoplasm of unspecified site of left female breast: Secondary | ICD-10-CM | POA: Diagnosis not present

## 2021-03-28 DIAGNOSIS — Z510 Encounter for antineoplastic radiation therapy: Secondary | ICD-10-CM | POA: Diagnosis not present

## 2021-03-29 DIAGNOSIS — Z510 Encounter for antineoplastic radiation therapy: Secondary | ICD-10-CM | POA: Diagnosis not present

## 2021-03-29 DIAGNOSIS — C50912 Malignant neoplasm of unspecified site of left female breast: Secondary | ICD-10-CM | POA: Diagnosis not present

## 2021-03-30 DIAGNOSIS — C50912 Malignant neoplasm of unspecified site of left female breast: Secondary | ICD-10-CM | POA: Diagnosis not present

## 2021-03-30 DIAGNOSIS — Z510 Encounter for antineoplastic radiation therapy: Secondary | ICD-10-CM | POA: Diagnosis not present

## 2021-04-02 DIAGNOSIS — C50912 Malignant neoplasm of unspecified site of left female breast: Secondary | ICD-10-CM | POA: Diagnosis not present

## 2021-04-02 DIAGNOSIS — Z510 Encounter for antineoplastic radiation therapy: Secondary | ICD-10-CM | POA: Diagnosis not present

## 2021-04-03 DIAGNOSIS — C50912 Malignant neoplasm of unspecified site of left female breast: Secondary | ICD-10-CM | POA: Diagnosis not present

## 2021-04-03 DIAGNOSIS — Z510 Encounter for antineoplastic radiation therapy: Secondary | ICD-10-CM | POA: Diagnosis not present

## 2021-04-04 DIAGNOSIS — C50912 Malignant neoplasm of unspecified site of left female breast: Secondary | ICD-10-CM | POA: Diagnosis not present

## 2021-04-04 DIAGNOSIS — Z510 Encounter for antineoplastic radiation therapy: Secondary | ICD-10-CM | POA: Diagnosis not present

## 2021-04-05 DIAGNOSIS — Z510 Encounter for antineoplastic radiation therapy: Secondary | ICD-10-CM | POA: Diagnosis not present

## 2021-04-05 DIAGNOSIS — C50912 Malignant neoplasm of unspecified site of left female breast: Secondary | ICD-10-CM | POA: Diagnosis not present

## 2021-04-06 DIAGNOSIS — C50912 Malignant neoplasm of unspecified site of left female breast: Secondary | ICD-10-CM | POA: Diagnosis not present

## 2021-04-06 DIAGNOSIS — Z510 Encounter for antineoplastic radiation therapy: Secondary | ICD-10-CM | POA: Diagnosis not present

## 2021-04-09 DIAGNOSIS — C50912 Malignant neoplasm of unspecified site of left female breast: Secondary | ICD-10-CM | POA: Diagnosis not present

## 2021-04-09 DIAGNOSIS — Z510 Encounter for antineoplastic radiation therapy: Secondary | ICD-10-CM | POA: Diagnosis not present

## 2021-04-10 DIAGNOSIS — Z510 Encounter for antineoplastic radiation therapy: Secondary | ICD-10-CM | POA: Diagnosis not present

## 2021-04-10 DIAGNOSIS — C50912 Malignant neoplasm of unspecified site of left female breast: Secondary | ICD-10-CM | POA: Diagnosis not present

## 2021-04-11 ENCOUNTER — Other Ambulatory Visit: Payer: Self-pay | Admitting: Cardiovascular Disease

## 2021-04-12 DIAGNOSIS — Z95828 Presence of other vascular implants and grafts: Secondary | ICD-10-CM | POA: Diagnosis not present

## 2021-04-12 DIAGNOSIS — Z5111 Encounter for antineoplastic chemotherapy: Secondary | ICD-10-CM | POA: Diagnosis not present

## 2021-04-12 DIAGNOSIS — C50412 Malignant neoplasm of upper-outer quadrant of left female breast: Secondary | ICD-10-CM | POA: Diagnosis not present

## 2021-04-12 DIAGNOSIS — C773 Secondary and unspecified malignant neoplasm of axilla and upper limb lymph nodes: Secondary | ICD-10-CM | POA: Diagnosis not present

## 2021-04-12 DIAGNOSIS — E538 Deficiency of other specified B group vitamins: Secondary | ICD-10-CM | POA: Diagnosis not present

## 2021-04-12 DIAGNOSIS — Z170 Estrogen receptor positive status [ER+]: Secondary | ICD-10-CM | POA: Diagnosis not present

## 2021-04-13 DIAGNOSIS — Z170 Estrogen receptor positive status [ER+]: Secondary | ICD-10-CM | POA: Diagnosis not present

## 2021-04-13 DIAGNOSIS — Z923 Personal history of irradiation: Secondary | ICD-10-CM | POA: Diagnosis not present

## 2021-04-13 DIAGNOSIS — Z9221 Personal history of antineoplastic chemotherapy: Secondary | ICD-10-CM | POA: Diagnosis not present

## 2021-04-13 DIAGNOSIS — N6489 Other specified disorders of breast: Secondary | ICD-10-CM | POA: Diagnosis not present

## 2021-04-13 DIAGNOSIS — C50912 Malignant neoplasm of unspecified site of left female breast: Secondary | ICD-10-CM | POA: Diagnosis not present

## 2021-04-13 DIAGNOSIS — F17290 Nicotine dependence, other tobacco product, uncomplicated: Secondary | ICD-10-CM | POA: Diagnosis not present

## 2021-04-13 DIAGNOSIS — Z9889 Other specified postprocedural states: Secondary | ICD-10-CM | POA: Diagnosis not present

## 2021-04-13 DIAGNOSIS — Z853 Personal history of malignant neoplasm of breast: Secondary | ICD-10-CM | POA: Diagnosis not present

## 2021-04-16 DIAGNOSIS — C50912 Malignant neoplasm of unspecified site of left female breast: Secondary | ICD-10-CM | POA: Diagnosis not present

## 2021-04-16 DIAGNOSIS — Z510 Encounter for antineoplastic radiation therapy: Secondary | ICD-10-CM | POA: Diagnosis not present

## 2021-04-17 DIAGNOSIS — Z510 Encounter for antineoplastic radiation therapy: Secondary | ICD-10-CM | POA: Diagnosis not present

## 2021-04-17 DIAGNOSIS — C50912 Malignant neoplasm of unspecified site of left female breast: Secondary | ICD-10-CM | POA: Diagnosis not present

## 2021-05-01 ENCOUNTER — Other Ambulatory Visit (HOSPITAL_COMMUNITY): Payer: Self-pay | Admitting: Cardiovascular Disease

## 2021-05-01 DIAGNOSIS — I739 Peripheral vascular disease, unspecified: Secondary | ICD-10-CM

## 2021-05-01 DIAGNOSIS — I708 Atherosclerosis of other arteries: Secondary | ICD-10-CM

## 2021-05-01 DIAGNOSIS — I7143 Infrarenal abdominal aortic aneurysm, without rupture: Secondary | ICD-10-CM

## 2021-05-03 DIAGNOSIS — S60211D Contusion of right wrist, subsequent encounter: Secondary | ICD-10-CM | POA: Diagnosis not present

## 2021-05-03 DIAGNOSIS — F17210 Nicotine dependence, cigarettes, uncomplicated: Secondary | ICD-10-CM | POA: Diagnosis not present

## 2021-05-03 DIAGNOSIS — Z043 Encounter for examination and observation following other accident: Secondary | ICD-10-CM | POA: Diagnosis not present

## 2021-05-03 DIAGNOSIS — D6481 Anemia due to antineoplastic chemotherapy: Secondary | ICD-10-CM | POA: Diagnosis not present

## 2021-05-03 DIAGNOSIS — Z9221 Personal history of antineoplastic chemotherapy: Secondary | ICD-10-CM | POA: Diagnosis not present

## 2021-05-03 DIAGNOSIS — Z95828 Presence of other vascular implants and grafts: Secondary | ICD-10-CM | POA: Diagnosis not present

## 2021-05-03 DIAGNOSIS — T451X5A Adverse effect of antineoplastic and immunosuppressive drugs, initial encounter: Secondary | ICD-10-CM | POA: Diagnosis not present

## 2021-05-03 DIAGNOSIS — W108XXA Fall (on) (from) other stairs and steps, initial encounter: Secondary | ICD-10-CM | POA: Diagnosis not present

## 2021-05-03 DIAGNOSIS — C50912 Malignant neoplasm of unspecified site of left female breast: Secondary | ICD-10-CM | POA: Diagnosis not present

## 2021-05-03 DIAGNOSIS — Z170 Estrogen receptor positive status [ER+]: Secondary | ICD-10-CM | POA: Diagnosis not present

## 2021-05-03 DIAGNOSIS — D649 Anemia, unspecified: Secondary | ICD-10-CM | POA: Diagnosis not present

## 2021-05-03 DIAGNOSIS — S0003XA Contusion of scalp, initial encounter: Secondary | ICD-10-CM | POA: Diagnosis not present

## 2021-05-03 DIAGNOSIS — I219 Acute myocardial infarction, unspecified: Secondary | ICD-10-CM | POA: Diagnosis not present

## 2021-05-03 DIAGNOSIS — Z803 Family history of malignant neoplasm of breast: Secondary | ICD-10-CM | POA: Diagnosis not present

## 2021-05-03 DIAGNOSIS — C773 Secondary and unspecified malignant neoplasm of axilla and upper limb lymph nodes: Secondary | ICD-10-CM | POA: Diagnosis not present

## 2021-05-03 DIAGNOSIS — I2510 Atherosclerotic heart disease of native coronary artery without angina pectoris: Secondary | ICD-10-CM | POA: Diagnosis not present

## 2021-05-03 DIAGNOSIS — E538 Deficiency of other specified B group vitamins: Secondary | ICD-10-CM | POA: Diagnosis not present

## 2021-05-03 DIAGNOSIS — C50412 Malignant neoplasm of upper-outer quadrant of left female breast: Secondary | ICD-10-CM | POA: Diagnosis not present

## 2021-05-03 DIAGNOSIS — Z79899 Other long term (current) drug therapy: Secondary | ICD-10-CM | POA: Diagnosis not present

## 2021-05-03 DIAGNOSIS — G629 Polyneuropathy, unspecified: Secondary | ICD-10-CM | POA: Diagnosis not present

## 2021-05-03 DIAGNOSIS — M25531 Pain in right wrist: Secondary | ICD-10-CM | POA: Diagnosis not present

## 2021-05-03 DIAGNOSIS — Z9012 Acquired absence of left breast and nipple: Secondary | ICD-10-CM | POA: Diagnosis not present

## 2021-05-03 DIAGNOSIS — R2241 Localized swelling, mass and lump, right lower limb: Secondary | ICD-10-CM | POA: Diagnosis not present

## 2021-05-03 DIAGNOSIS — S0093XD Contusion of unspecified part of head, subsequent encounter: Secondary | ICD-10-CM | POA: Diagnosis not present

## 2021-05-10 DIAGNOSIS — Z5111 Encounter for antineoplastic chemotherapy: Secondary | ICD-10-CM | POA: Diagnosis not present

## 2021-05-10 DIAGNOSIS — C50412 Malignant neoplasm of upper-outer quadrant of left female breast: Secondary | ICD-10-CM | POA: Diagnosis not present

## 2021-05-10 DIAGNOSIS — Z95828 Presence of other vascular implants and grafts: Secondary | ICD-10-CM | POA: Diagnosis not present

## 2021-05-10 DIAGNOSIS — C773 Secondary and unspecified malignant neoplasm of axilla and upper limb lymph nodes: Secondary | ICD-10-CM | POA: Diagnosis not present

## 2021-05-10 DIAGNOSIS — Z170 Estrogen receptor positive status [ER+]: Secondary | ICD-10-CM | POA: Diagnosis not present

## 2021-05-21 DIAGNOSIS — Z79890 Hormone replacement therapy (postmenopausal): Secondary | ICD-10-CM | POA: Diagnosis not present

## 2021-05-21 DIAGNOSIS — Z170 Estrogen receptor positive status [ER+]: Secondary | ICD-10-CM | POA: Diagnosis not present

## 2021-05-21 DIAGNOSIS — C50812 Malignant neoplasm of overlapping sites of left female breast: Secondary | ICD-10-CM | POA: Diagnosis not present

## 2021-05-21 DIAGNOSIS — Z923 Personal history of irradiation: Secondary | ICD-10-CM | POA: Diagnosis not present

## 2021-05-31 DIAGNOSIS — Z95828 Presence of other vascular implants and grafts: Secondary | ICD-10-CM | POA: Diagnosis not present

## 2021-05-31 DIAGNOSIS — C50412 Malignant neoplasm of upper-outer quadrant of left female breast: Secondary | ICD-10-CM | POA: Diagnosis not present

## 2021-05-31 DIAGNOSIS — Z170 Estrogen receptor positive status [ER+]: Secondary | ICD-10-CM | POA: Diagnosis not present

## 2021-05-31 DIAGNOSIS — I252 Old myocardial infarction: Secondary | ICD-10-CM | POA: Diagnosis not present

## 2021-05-31 DIAGNOSIS — C50912 Malignant neoplasm of unspecified site of left female breast: Secondary | ICD-10-CM | POA: Diagnosis not present

## 2021-05-31 DIAGNOSIS — F17210 Nicotine dependence, cigarettes, uncomplicated: Secondary | ICD-10-CM | POA: Diagnosis not present

## 2021-05-31 DIAGNOSIS — G629 Polyneuropathy, unspecified: Secondary | ICD-10-CM | POA: Diagnosis not present

## 2021-05-31 DIAGNOSIS — D649 Anemia, unspecified: Secondary | ICD-10-CM | POA: Diagnosis not present

## 2021-05-31 DIAGNOSIS — E538 Deficiency of other specified B group vitamins: Secondary | ICD-10-CM | POA: Diagnosis not present

## 2021-05-31 DIAGNOSIS — I739 Peripheral vascular disease, unspecified: Secondary | ICD-10-CM | POA: Diagnosis not present

## 2021-05-31 DIAGNOSIS — Z955 Presence of coronary angioplasty implant and graft: Secondary | ICD-10-CM | POA: Diagnosis not present

## 2021-05-31 DIAGNOSIS — Z79899 Other long term (current) drug therapy: Secondary | ICD-10-CM | POA: Diagnosis not present

## 2021-05-31 DIAGNOSIS — T451X5A Adverse effect of antineoplastic and immunosuppressive drugs, initial encounter: Secondary | ICD-10-CM | POA: Diagnosis not present

## 2021-05-31 DIAGNOSIS — C773 Secondary and unspecified malignant neoplasm of axilla and upper limb lymph nodes: Secondary | ICD-10-CM | POA: Diagnosis not present

## 2021-05-31 DIAGNOSIS — I10 Essential (primary) hypertension: Secondary | ICD-10-CM | POA: Diagnosis not present

## 2021-05-31 DIAGNOSIS — I2510 Atherosclerotic heart disease of native coronary artery without angina pectoris: Secondary | ICD-10-CM | POA: Diagnosis not present

## 2021-05-31 DIAGNOSIS — E785 Hyperlipidemia, unspecified: Secondary | ICD-10-CM | POA: Diagnosis not present

## 2021-05-31 DIAGNOSIS — D630 Anemia in neoplastic disease: Secondary | ICD-10-CM | POA: Diagnosis not present

## 2021-06-21 DIAGNOSIS — Z170 Estrogen receptor positive status [ER+]: Secondary | ICD-10-CM | POA: Diagnosis not present

## 2021-06-21 DIAGNOSIS — Z95828 Presence of other vascular implants and grafts: Secondary | ICD-10-CM | POA: Diagnosis not present

## 2021-06-21 DIAGNOSIS — Z5111 Encounter for antineoplastic chemotherapy: Secondary | ICD-10-CM | POA: Diagnosis not present

## 2021-06-21 DIAGNOSIS — C50412 Malignant neoplasm of upper-outer quadrant of left female breast: Secondary | ICD-10-CM | POA: Diagnosis not present

## 2021-06-21 DIAGNOSIS — C773 Secondary and unspecified malignant neoplasm of axilla and upper limb lymph nodes: Secondary | ICD-10-CM | POA: Diagnosis not present

## 2021-06-22 DIAGNOSIS — N6489 Other specified disorders of breast: Secondary | ICD-10-CM | POA: Diagnosis not present

## 2021-06-22 DIAGNOSIS — Z9012 Acquired absence of left breast and nipple: Secondary | ICD-10-CM | POA: Diagnosis not present

## 2021-06-22 DIAGNOSIS — C50912 Malignant neoplasm of unspecified site of left female breast: Secondary | ICD-10-CM | POA: Diagnosis not present

## 2021-06-22 DIAGNOSIS — Z170 Estrogen receptor positive status [ER+]: Secondary | ICD-10-CM | POA: Diagnosis not present

## 2021-06-22 DIAGNOSIS — C50412 Malignant neoplasm of upper-outer quadrant of left female breast: Secondary | ICD-10-CM | POA: Diagnosis not present

## 2021-06-26 ENCOUNTER — Other Ambulatory Visit: Payer: Self-pay | Admitting: Cardiovascular Disease

## 2021-07-09 DIAGNOSIS — N6489 Other specified disorders of breast: Secondary | ICD-10-CM | POA: Diagnosis not present

## 2021-07-09 DIAGNOSIS — Z853 Personal history of malignant neoplasm of breast: Secondary | ICD-10-CM | POA: Diagnosis not present

## 2021-07-09 DIAGNOSIS — C50412 Malignant neoplasm of upper-outer quadrant of left female breast: Secondary | ICD-10-CM | POA: Diagnosis not present

## 2021-07-09 DIAGNOSIS — C50912 Malignant neoplasm of unspecified site of left female breast: Secondary | ICD-10-CM | POA: Diagnosis not present

## 2021-07-09 DIAGNOSIS — Z9889 Other specified postprocedural states: Secondary | ICD-10-CM | POA: Diagnosis not present

## 2021-07-09 DIAGNOSIS — Z79899 Other long term (current) drug therapy: Secondary | ICD-10-CM | POA: Diagnosis not present

## 2021-07-09 DIAGNOSIS — Z923 Personal history of irradiation: Secondary | ICD-10-CM | POA: Diagnosis not present

## 2021-07-13 DIAGNOSIS — C773 Secondary and unspecified malignant neoplasm of axilla and upper limb lymph nodes: Secondary | ICD-10-CM | POA: Diagnosis not present

## 2021-07-13 DIAGNOSIS — Z5111 Encounter for antineoplastic chemotherapy: Secondary | ICD-10-CM | POA: Diagnosis not present

## 2021-07-13 DIAGNOSIS — Z79811 Long term (current) use of aromatase inhibitors: Secondary | ICD-10-CM | POA: Diagnosis not present

## 2021-07-13 DIAGNOSIS — C50912 Malignant neoplasm of unspecified site of left female breast: Secondary | ICD-10-CM | POA: Diagnosis not present

## 2021-07-13 DIAGNOSIS — E538 Deficiency of other specified B group vitamins: Secondary | ICD-10-CM | POA: Diagnosis not present

## 2021-07-13 DIAGNOSIS — Z170 Estrogen receptor positive status [ER+]: Secondary | ICD-10-CM | POA: Diagnosis not present

## 2021-07-13 DIAGNOSIS — C50412 Malignant neoplasm of upper-outer quadrant of left female breast: Secondary | ICD-10-CM | POA: Diagnosis not present

## 2021-07-13 DIAGNOSIS — Z95828 Presence of other vascular implants and grafts: Secondary | ICD-10-CM | POA: Diagnosis not present

## 2021-08-03 DIAGNOSIS — M8580 Other specified disorders of bone density and structure, unspecified site: Secondary | ICD-10-CM | POA: Diagnosis not present

## 2021-08-03 DIAGNOSIS — C50912 Malignant neoplasm of unspecified site of left female breast: Secondary | ICD-10-CM | POA: Diagnosis not present

## 2021-08-03 DIAGNOSIS — Z79811 Long term (current) use of aromatase inhibitors: Secondary | ICD-10-CM | POA: Diagnosis not present

## 2021-08-03 DIAGNOSIS — D630 Anemia in neoplastic disease: Secondary | ICD-10-CM | POA: Diagnosis not present

## 2021-08-03 DIAGNOSIS — Z972 Presence of dental prosthetic device (complete) (partial): Secondary | ICD-10-CM | POA: Diagnosis not present

## 2021-08-03 DIAGNOSIS — I2510 Atherosclerotic heart disease of native coronary artery without angina pectoris: Secondary | ICD-10-CM | POA: Diagnosis not present

## 2021-08-03 DIAGNOSIS — C50412 Malignant neoplasm of upper-outer quadrant of left female breast: Secondary | ICD-10-CM | POA: Diagnosis not present

## 2021-08-03 DIAGNOSIS — D638 Anemia in other chronic diseases classified elsewhere: Secondary | ICD-10-CM | POA: Diagnosis not present

## 2021-08-03 DIAGNOSIS — Z7901 Long term (current) use of anticoagulants: Secondary | ICD-10-CM | POA: Diagnosis not present

## 2021-08-03 DIAGNOSIS — Z9221 Personal history of antineoplastic chemotherapy: Secondary | ICD-10-CM | POA: Diagnosis not present

## 2021-08-03 DIAGNOSIS — F17210 Nicotine dependence, cigarettes, uncomplicated: Secondary | ICD-10-CM | POA: Diagnosis not present

## 2021-08-03 DIAGNOSIS — Z79899 Other long term (current) drug therapy: Secondary | ICD-10-CM | POA: Diagnosis not present

## 2021-08-03 DIAGNOSIS — I252 Old myocardial infarction: Secondary | ICD-10-CM | POA: Diagnosis not present

## 2021-08-03 DIAGNOSIS — M8589 Other specified disorders of bone density and structure, multiple sites: Secondary | ICD-10-CM | POA: Diagnosis not present

## 2021-08-03 DIAGNOSIS — Z170 Estrogen receptor positive status [ER+]: Secondary | ICD-10-CM | POA: Diagnosis not present

## 2021-08-03 DIAGNOSIS — Z803 Family history of malignant neoplasm of breast: Secondary | ICD-10-CM | POA: Diagnosis not present

## 2021-08-03 DIAGNOSIS — Z95828 Presence of other vascular implants and grafts: Secondary | ICD-10-CM | POA: Diagnosis not present

## 2021-08-03 DIAGNOSIS — E538 Deficiency of other specified B group vitamins: Secondary | ICD-10-CM | POA: Diagnosis not present

## 2021-08-03 DIAGNOSIS — G629 Polyneuropathy, unspecified: Secondary | ICD-10-CM | POA: Diagnosis not present

## 2021-08-27 ENCOUNTER — Emergency Department (HOSPITAL_COMMUNITY): Payer: PPO

## 2021-08-27 ENCOUNTER — Encounter (HOSPITAL_COMMUNITY): Payer: Self-pay | Admitting: Internal Medicine

## 2021-08-27 ENCOUNTER — Inpatient Hospital Stay (HOSPITAL_COMMUNITY)
Admission: EM | Admit: 2021-08-27 | Discharge: 2021-08-30 | DRG: 312 | Disposition: A | Discharge status: Skilled Nursing Facility | Payer: PPO | Attending: Family Medicine | Admitting: Family Medicine

## 2021-08-27 ENCOUNTER — Other Ambulatory Visit: Payer: Self-pay

## 2021-08-27 DIAGNOSIS — S42202A Unspecified fracture of upper end of left humerus, initial encounter for closed fracture: Secondary | ICD-10-CM | POA: Diagnosis present

## 2021-08-27 DIAGNOSIS — I951 Orthostatic hypotension: Principal | ICD-10-CM | POA: Diagnosis present

## 2021-08-27 DIAGNOSIS — Z79899 Other long term (current) drug therapy: Secondary | ICD-10-CM

## 2021-08-27 DIAGNOSIS — D62 Acute posthemorrhagic anemia: Secondary | ICD-10-CM | POA: Diagnosis not present

## 2021-08-27 DIAGNOSIS — R2689 Other abnormalities of gait and mobility: Secondary | ICD-10-CM | POA: Diagnosis not present

## 2021-08-27 DIAGNOSIS — S61411A Laceration without foreign body of right hand, initial encounter: Secondary | ICD-10-CM | POA: Diagnosis present

## 2021-08-27 DIAGNOSIS — E876 Hypokalemia: Secondary | ICD-10-CM | POA: Diagnosis present

## 2021-08-27 DIAGNOSIS — I5032 Chronic diastolic (congestive) heart failure: Secondary | ICD-10-CM | POA: Diagnosis not present

## 2021-08-27 DIAGNOSIS — R296 Repeated falls: Secondary | ICD-10-CM | POA: Diagnosis present

## 2021-08-27 DIAGNOSIS — R42 Dizziness and giddiness: Secondary | ICD-10-CM

## 2021-08-27 DIAGNOSIS — K219 Gastro-esophageal reflux disease without esophagitis: Secondary | ICD-10-CM | POA: Diagnosis present

## 2021-08-27 DIAGNOSIS — N179 Acute kidney failure, unspecified: Secondary | ICD-10-CM | POA: Diagnosis present

## 2021-08-27 DIAGNOSIS — E871 Hypo-osmolality and hyponatremia: Secondary | ICD-10-CM | POA: Diagnosis present

## 2021-08-27 DIAGNOSIS — E878 Other disorders of electrolyte and fluid balance, not elsewhere classified: Secondary | ICD-10-CM | POA: Diagnosis present

## 2021-08-27 DIAGNOSIS — I5042 Chronic combined systolic (congestive) and diastolic (congestive) heart failure: Secondary | ICD-10-CM | POA: Diagnosis present

## 2021-08-27 DIAGNOSIS — I714 Abdominal aortic aneurysm, without rupture, unspecified: Secondary | ICD-10-CM | POA: Diagnosis present

## 2021-08-27 DIAGNOSIS — Z8371 Family history of colonic polyps: Secondary | ICD-10-CM

## 2021-08-27 DIAGNOSIS — Z87891 Personal history of nicotine dependence: Secondary | ICD-10-CM

## 2021-08-27 DIAGNOSIS — J432 Centrilobular emphysema: Secondary | ICD-10-CM | POA: Diagnosis not present

## 2021-08-27 DIAGNOSIS — I959 Hypotension, unspecified: Secondary | ICD-10-CM | POA: Diagnosis not present

## 2021-08-27 DIAGNOSIS — E785 Hyperlipidemia, unspecified: Secondary | ICD-10-CM | POA: Diagnosis present

## 2021-08-27 DIAGNOSIS — R4020 Unspecified coma: Secondary | ICD-10-CM | POA: Diagnosis not present

## 2021-08-27 DIAGNOSIS — Z86010 Personal history of colonic polyps: Secondary | ICD-10-CM

## 2021-08-27 DIAGNOSIS — S51011A Laceration without foreign body of right elbow, initial encounter: Secondary | ICD-10-CM | POA: Diagnosis present

## 2021-08-27 DIAGNOSIS — R531 Weakness: Secondary | ICD-10-CM | POA: Diagnosis not present

## 2021-08-27 DIAGNOSIS — Z955 Presence of coronary angioplasty implant and graft: Secondary | ICD-10-CM

## 2021-08-27 DIAGNOSIS — I252 Old myocardial infarction: Secondary | ICD-10-CM

## 2021-08-27 DIAGNOSIS — I6529 Occlusion and stenosis of unspecified carotid artery: Secondary | ICD-10-CM | POA: Diagnosis not present

## 2021-08-27 DIAGNOSIS — Z9221 Personal history of antineoplastic chemotherapy: Secondary | ICD-10-CM | POA: Diagnosis not present

## 2021-08-27 DIAGNOSIS — Z741 Need for assistance with personal care: Secondary | ICD-10-CM | POA: Diagnosis not present

## 2021-08-27 DIAGNOSIS — M4312 Spondylolisthesis, cervical region: Secondary | ICD-10-CM | POA: Diagnosis not present

## 2021-08-27 DIAGNOSIS — W19XXXA Unspecified fall, initial encounter: Secondary | ICD-10-CM | POA: Diagnosis present

## 2021-08-27 DIAGNOSIS — I2510 Atherosclerotic heart disease of native coronary artery without angina pectoris: Secondary | ICD-10-CM | POA: Diagnosis present

## 2021-08-27 DIAGNOSIS — D649 Anemia, unspecified: Secondary | ICD-10-CM | POA: Diagnosis present

## 2021-08-27 DIAGNOSIS — R2681 Unsteadiness on feet: Secondary | ICD-10-CM | POA: Diagnosis not present

## 2021-08-27 DIAGNOSIS — E860 Dehydration: Secondary | ICD-10-CM | POA: Diagnosis present

## 2021-08-27 DIAGNOSIS — I110 Hypertensive heart disease with heart failure: Secondary | ICD-10-CM | POA: Diagnosis present

## 2021-08-27 DIAGNOSIS — I459 Conduction disorder, unspecified: Secondary | ICD-10-CM | POA: Diagnosis present

## 2021-08-27 DIAGNOSIS — S0990XA Unspecified injury of head, initial encounter: Secondary | ICD-10-CM | POA: Diagnosis not present

## 2021-08-27 DIAGNOSIS — Z7902 Long term (current) use of antithrombotics/antiplatelets: Secondary | ICD-10-CM | POA: Diagnosis not present

## 2021-08-27 DIAGNOSIS — R55 Syncope and collapse: Secondary | ICD-10-CM | POA: Diagnosis present

## 2021-08-27 DIAGNOSIS — S42201A Unspecified fracture of upper end of right humerus, initial encounter for closed fracture: Secondary | ICD-10-CM | POA: Diagnosis present

## 2021-08-27 DIAGNOSIS — M25511 Pain in right shoulder: Secondary | ICD-10-CM | POA: Diagnosis not present

## 2021-08-27 DIAGNOSIS — Z23 Encounter for immunization: Secondary | ICD-10-CM | POA: Diagnosis present

## 2021-08-27 DIAGNOSIS — Z853 Personal history of malignant neoplasm of breast: Secondary | ICD-10-CM

## 2021-08-27 DIAGNOSIS — R58 Hemorrhage, not elsewhere classified: Secondary | ICD-10-CM | POA: Diagnosis not present

## 2021-08-27 DIAGNOSIS — Z043 Encounter for examination and observation following other accident: Secondary | ICD-10-CM | POA: Diagnosis not present

## 2021-08-27 DIAGNOSIS — Z8249 Family history of ischemic heart disease and other diseases of the circulatory system: Secondary | ICD-10-CM

## 2021-08-27 DIAGNOSIS — M6281 Muscle weakness (generalized): Secondary | ICD-10-CM | POA: Diagnosis not present

## 2021-08-27 DIAGNOSIS — R278 Other lack of coordination: Secondary | ICD-10-CM | POA: Diagnosis not present

## 2021-08-27 DIAGNOSIS — R293 Abnormal posture: Secondary | ICD-10-CM | POA: Diagnosis not present

## 2021-08-27 DIAGNOSIS — I10 Essential (primary) hypertension: Secondary | ICD-10-CM | POA: Diagnosis not present

## 2021-08-27 HISTORY — DX: Malignant neoplasm of unspecified site of unspecified female breast: C50.919

## 2021-08-27 LAB — COMPREHENSIVE METABOLIC PANEL
ALT: 15 U/L (ref 0–44)
AST: 29 U/L (ref 15–41)
Albumin: 3.5 g/dL (ref 3.5–5.0)
Alkaline Phosphatase: 44 U/L (ref 38–126)
Anion gap: 10 (ref 5–15)
BUN: 19 mg/dL (ref 8–23)
CO2: 26 mmol/L (ref 22–32)
Calcium: 8.6 mg/dL — ABNORMAL LOW (ref 8.9–10.3)
Chloride: 90 mmol/L — ABNORMAL LOW (ref 98–111)
Creatinine, Ser: 1.17 mg/dL — ABNORMAL HIGH (ref 0.44–1.00)
GFR, Estimated: 51 mL/min — ABNORMAL LOW (ref >60)
Glucose, Bld: 130 mg/dL — ABNORMAL HIGH (ref 70–99)
Potassium: 3.4 mmol/L — ABNORMAL LOW (ref 3.5–5.1)
Sodium: 126 mmol/L — ABNORMAL LOW (ref 135–145)
Total Bilirubin: 0.9 mg/dL (ref 0.3–1.2)
Total Protein: 5.7 g/dL — ABNORMAL LOW (ref 6.5–8.1)

## 2021-08-27 LAB — CBC WITH DIFFERENTIAL/PLATELET
Abs Immature Granulocytes: 0.02 10*3/uL (ref 0.00–0.07)
Basophils Absolute: 0 10*3/uL (ref 0.0–0.1)
Basophils Relative: 0 %
Eosinophils Absolute: 0.1 10*3/uL (ref 0.0–0.5)
Eosinophils Relative: 1 %
HCT: 26.3 % — ABNORMAL LOW (ref 36.0–46.0)
Hemoglobin: 9.1 g/dL — ABNORMAL LOW (ref 12.0–15.0)
Immature Granulocytes: 0 %
Lymphocytes Relative: 9 %
Lymphs Abs: 0.7 10*3/uL (ref 0.7–4.0)
MCH: 33 pg (ref 26.0–34.0)
MCHC: 34.6 g/dL (ref 30.0–36.0)
MCV: 95.3 fL (ref 80.0–100.0)
Monocytes Absolute: 0.9 10*3/uL (ref 0.1–1.0)
Monocytes Relative: 11 %
Neutro Abs: 6.4 10*3/uL (ref 1.7–7.7)
Neutrophils Relative %: 79 %
Platelets: 136 10*3/uL — ABNORMAL LOW (ref 150–400)
RBC: 2.76 MIL/uL — ABNORMAL LOW (ref 3.87–5.11)
RDW: 14.6 % (ref 11.5–15.5)
WBC: 8 10*3/uL (ref 4.0–10.5)
nRBC: 0 % (ref 0.0–0.2)

## 2021-08-27 LAB — TROPONIN I (HIGH SENSITIVITY): Troponin I (High Sensitivity): 8 ng/L (ref <18)

## 2021-08-27 LAB — URINALYSIS, ROUTINE W REFLEX MICROSCOPIC
Bilirubin Urine: NEGATIVE
Glucose, UA: NEGATIVE mg/dL
Hgb urine dipstick: NEGATIVE
Ketones, ur: NEGATIVE mg/dL
Nitrite: NEGATIVE
Protein, ur: NEGATIVE mg/dL
Specific Gravity, Urine: 1.013 (ref 1.005–1.030)
pH: 5 (ref 5.0–8.0)

## 2021-08-27 LAB — PROTIME-INR
INR: 1 (ref 0.8–1.2)
Prothrombin Time: 12.9 seconds (ref 11.4–15.2)

## 2021-08-27 LAB — MAGNESIUM: Magnesium: 1.4 mg/dL — ABNORMAL LOW (ref 1.7–2.4)

## 2021-08-27 LAB — D-DIMER, QUANTITATIVE: D-Dimer, Quant: 15.63 ug/mL-FEU — ABNORMAL HIGH (ref 0.00–0.50)

## 2021-08-27 MED ORDER — ACETAMINOPHEN 650 MG RE SUPP
650.0000 mg | Freq: Four times a day (QID) | RECTAL | Status: DC | PRN
Start: 2021-08-27 — End: 2021-08-30

## 2021-08-27 MED ORDER — ACETAMINOPHEN 325 MG PO TABS
650.0000 mg | ORAL_TABLET | Freq: Four times a day (QID) | ORAL | Status: DC | PRN
Start: 2021-08-27 — End: 2021-08-30
  Administered 2021-08-28: 650 mg via ORAL
  Filled 2021-08-27: qty 2

## 2021-08-27 MED ORDER — TETANUS-DIPHTH-ACELL PERTUSSIS 5-2.5-18.5 LF-MCG/0.5 IM SUSY
0.5000 mL | PREFILLED_SYRINGE | Freq: Once | INTRAMUSCULAR | Status: AC
  Administered 2021-08-27: 0.5 mL via INTRAMUSCULAR
  Filled 2021-08-27: qty 0.5

## 2021-08-27 MED ORDER — LACTATED RINGERS IV SOLN: INTRAVENOUS | Status: DC

## 2021-08-27 MED ORDER — LACTATED RINGERS IV BOLUS
500.0000 mL | Freq: Once | INTRAVENOUS | Status: AC
  Administered 2021-08-27: 500 mL via INTRAVENOUS

## 2021-08-27 MED ORDER — ONDANSETRON HCL 4 MG/2ML IJ SOLN
4.0000 mg | Freq: Four times a day (QID) | INTRAMUSCULAR | Status: DC | PRN
Start: 2021-08-27 — End: 2021-08-30

## 2021-08-27 MED ORDER — LACTATED RINGERS IV BOLUS
500.0000 mL | Freq: Once | INTRAVENOUS | Status: AC
  Administered 2021-08-28: 500 mL via INTRAVENOUS

## 2021-08-27 MED ORDER — MAGNESIUM SULFATE 2 GM/50ML IV SOLN
2.0000 g | Freq: Once | INTRAVENOUS | Status: AC
  Administered 2021-08-27: 2 g via INTRAVENOUS
  Filled 2021-08-27: qty 50

## 2021-08-27 NOTE — ED Notes (Signed)
Patient transported to CT 

## 2021-08-27 NOTE — Progress Notes (Signed)
Orthopedic Tech Progress Note ?Patient Details:  ?[name] ?1951/05/25 ?[id number] ? ?Ortho Devices ?Type of Ortho Device: Sling immobilizer ?Ortho Device/Splint Location: rue ?Ortho Device/Splint Interventions: Ordered, Application, Adjustment ?  ?Post Interventions ?Patient Tolerated: Well ?Instructions Provided: Care of device, Adjustment of device ? ?[name] ?08/27/2021, 9:53 PM ? ?

## 2021-08-27 NOTE — ED Provider Notes (Signed)
?[facility] ?Provider Note ? ? ?CSN: [id number] ?Arrival date & time: 08/27/21  1810 ? ?  ? ?History ? ?Chief Complaint  ?Patient presents with  ? Fall  ? Loss of Consciousness  ? ? ?[name] is a 71 y.o. female. ? ? ?Fall ?Patient presenting for dizziness and near syncope over the past 3 days.  She denies any associated chest discomfort.  She states that she has been eating and drinking normally.  She denies any vomiting or diarrhea. She has had multiple falls over the past 24 hours.   Today she had an episode of syncope.  This led to EMS being called.  EMS noted hypotension on scene with standing.  When at rest, patient's blood pressure normalized without any IV fluids.  Medical history includes breast cancer, HLD, HTN, CAD, CHF, AAA, anemia, PAD.  She has been told that her breast cancer is in remission.  Her last chemotherapy was 2 months ago.  Currently, patient denies any areas of discomfort.  She states that she normally lives with her husband, however, he is currently in a rehab facility.  She has been home alone.  She denies any history of similar symptoms in the past.  She does take blood pressure medications but has not taken any in the past 2 days.  She has not taken her medications due to the persistent dizziness. ? ?  ? ?Home Medications ?Prior to Admission medications   ?Medication Sig Start Date End Date Taking? Authorizing Provider  ?carvedilol (COREG) 6.25 MG tablet TAKE 1 TABLET(6.25 MG) BY MOUTH TWICE DAILY ?Patient taking differently: Take 6.25 mg by mouth 2 (two) times daily with a meal. TAKE 1 TABLET(6.25 MG) BY MOUTH TWICE DAILY 04/11/21  Yes Josue Hector, MD  ?clopidogrel (PLAVIX) 75 MG tablet TAKE 1 TABLET BY MOUTH EVERY DAY ?Patient taking differently: Take 75 mg by mouth daily. 06/26/21  Yes Josue Hector, MD  ?gabapentin (NEURONTIN) 300 MG capsule Take 1 capsule (300 mg total) by mouth 2 (two) times daily. 01/17/20  Yes Narda Amber K, DO   ?iron polysaccharides (NIFEREX) 150 MG capsule Take 150 mg by mouth daily.   Yes [provider]  ?letrozole (FEMARA) 2.5 MG tablet Take 2.5 mg by mouth daily. 08/05/21  Yes [provider]  ?losartan-hydrochlorothiazide (HYZAAR) 100-25 MG tablet Take 1 tablet by mouth daily. 02/20/17  Yes Josue Hector, MD  ?pantoprazole (PROTONIX) 20 MG tablet Take 20 mg by mouth daily. 12/21/19  Yes [provider]  ?simvastatin (ZOCOR) 40 MG tablet TAKE 1 TABLET(40 MG) BY MOUTH DAILY AT 6 PM ?Patient taking differently: Take 40 mg by mouth daily at 6 PM. TAKE 1 TABLET(40 MG) BY MOUTH DAILY AT 6 PM 04/11/21  Yes Josue Hector, MD  ?VITAMIN D, CHOLECALCIFEROL, PO Take 1 tablet by mouth 2 (two) times daily.   Yes [provider]  ?docusate sodium (COLACE) 100 MG capsule Take 100 mg by mouth 2 (two) times daily. ?Patient not taking: Reported on 08/27/2021    [provider]  ?   ? ?Allergies    ?Patient has no known allergies.   ? ?Review of Systems   ?Review of Systems  ?Constitutional:  Positive for fatigue.  ?Neurological:  Positive for dizziness, syncope and light-headedness.  ?All other systems reviewed and are negative. ? ?Physical Exam ?Updated Vital Signs ?BP 104/62   Pulse 65   Temp 97.9 ?F (36.6 ?C) (Oral)   Resp Marland Kitchen)  22   Ht '5\' 3"'$  (1.6 m)   Wt 43.5 kg   SpO2 100%   BMI 17.01 kg/m?  ?Physical Exam ?Vitals and nursing note reviewed.  ?Constitutional:   ?   General: She is not in acute distress. ?   Appearance: Normal appearance. She is well-developed. She is not ill-appearing, toxic-appearing or diaphoretic.  ?HENT:  ?   Head: Normocephalic and atraumatic.  ?   Right Ear: External ear normal.  ?   Left Ear: External ear normal.  ?   Nose: Nose normal.  ?   Mouth/Throat:  ?   Mouth: Mucous membranes are moist.  ?Eyes:  ?   Extraocular Movements: Extraocular movements intact.  ?   Conjunctiva/sclera: Conjunctivae normal.  ?Cardiovascular:  ?   Rate and Rhythm: Normal rate and  regular rhythm.  ?   Heart sounds: No murmur heard. ?Pulmonary:  ?   Effort: Pulmonary effort is normal. No respiratory distress.  ?   Breath sounds: Normal breath sounds. No wheezing, rhonchi or rales.  ?Chest:  ?   Chest wall: No tenderness.  ?Abdominal:  ?   General: Abdomen is flat.  ?   Palpations: Abdomen is soft.  ?   Tenderness: There is no abdominal tenderness.  ?Musculoskeletal:     ?   General: No swelling. Normal range of motion.  ?   Cervical back: Normal range of motion and neck supple. Tenderness present.  ?   Right lower leg: No edema.  ?   Left lower leg: No edema.  ?Skin: ?   General: Skin is warm.  ?   Capillary Refill: Capillary refill takes less than 2 seconds.  ?   Findings: Bruising present.  ?   Comments: Bruising and skin tear to right elbow, skin tear to right hand  ?Neurological:  ?   General: No focal deficit present.  ?   Mental Status: She is alert. She is disoriented.  ?   Cranial Nerves: No cranial nerve deficit.  ?   Sensory: No sensory deficit.  ?   Motor: No weakness.  ?   Coordination: Coordination normal.  ?Psychiatric:     ?   Mood and Affect: Mood normal.     ?   Behavior: Behavior normal.     ?   Thought Content: Thought content normal.     ?   Judgment: Judgment normal.  ? ? ?ED Results / Procedures / Treatments   ?Labs ?(all labs ordered are listed, but only abnormal results are displayed) ?Labs Reviewed  ?CBC WITH DIFFERENTIAL/PLATELET - Abnormal; Notable for the following components:  ?    Result Value  ? RBC 2.76 (*)   ? Hemoglobin 9.1 (*)   ? HCT 26.3 (*)   ? Platelets 136 (*)   ? All other components within normal limits  ?COMPREHENSIVE METABOLIC PANEL - Abnormal; Notable for the following components:  ? Sodium 126 (*)   ? Potassium 3.4 (*)   ? Chloride 90 (*)   ? Glucose, Bld 130 (*)   ? Creatinine, Ser 1.17 (*)   ? Calcium 8.6 (*)   ? Total Protein 5.7 (*)   ? GFR, Estimated 51 (*)   ? All other components within normal limits  ?URINALYSIS, ROUTINE W REFLEX  MICROSCOPIC - Abnormal; Notable for the following components:  ? APPearance HAZY (*)   ? Leukocytes,Ua TRACE (*)   ? Bacteria, UA RARE (*)   ? All other components within normal limits  ?  MAGNESIUM - Abnormal; Notable for the following components:  ? Magnesium 1.4 (*)   ? All other components within normal limits  ?D-DIMER, QUANTITATIVE - Abnormal; Notable for the following components:  ? D-Dimer, Quant 15.63 (*)   ? All other components within normal limits  ?PROTIME-INR  ?MAGNESIUM  ?COMPREHENSIVE METABOLIC PANEL  ?CBC WITH DIFFERENTIAL/PLATELET  ?BASIC METABOLIC PANEL  ?TROPONIN I (HIGH SENSITIVITY)  ?TROPONIN I (HIGH SENSITIVITY)  ? ? ?EKG ?EKG Interpretation ? ?Date/Time:  Monday August 27 2021 18:21:39 EDT ?Ventricular Rate:  75 ?PR Interval:  155 ?QRS Duration: 119 ?QT Interval:  398 ?QTC Calculation: 445 ?R Axis:   9 ?Text Interpretation: Sinus rhythm Nonspecific intraventricular conduction delay Confirmed by Godfrey Pick [id number]) on 08/27/2021 6:34:37 PM ? ?Radiology ?DG Shoulder Right ? ?Result Date: 08/27/2021 ?CLINICAL DATA:  Right shoulder pain following fall, initial encounter EXAM: RIGHT SHOULDER - 2+ VIEW COMPARISON:  None. FINDINGS: Minimally impacted proximal right humeral fracture is noted involving the surgical neck primarily. No dislocation is seen. No other focal abnormality is noted. IMPRESSION: Proximal right humeral fracture as described without evidence of dislocation. Electronically Signed   By: Inez Catalina M.D.   On: 08/27/2021 20:18  ? ?DG Elbow Complete Right ? ?Result Date: 08/27/2021 ?CLINICAL DATA:  Status post fall. EXAM: RIGHT ELBOW - COMPLETE 3+ VIEW COMPARISON:  None. FINDINGS: There is no evidence of fracture, dislocation, or joint effusion. There is no evidence of arthropathy or other focal bone abnormality. Soft tissues are unremarkable. IMPRESSION: Negative. Electronically Signed   By: Virgina Norfolk M.D.   On: 08/27/2021 19:33  ? ?CT HEAD WO CONTRAST ? ?Result Date:  08/27/2021 ?CLINICAL DATA:  Head trauma EXAM: CT HEAD WITHOUT CONTRAST TECHNIQUE: Contiguous axial images were obtained from the base of the skull through the vertex without intravenous contrast. RADIATION DOSE REDUCTION: This exam w

## 2021-08-27 NOTE — ED Triage Notes (Signed)
Pt BIB EMS from home c/o dizziness x 3 days, syncope today while walking, fell 6x within 24hrs. On plavix, last dose last night. Hematoma to posterior head, skin tear to rt elbow. Denies pain, no chest pain/SOB, no n/v/d, ao x 3- confusion to time. ? ?SBP palpated 60s--> 114/68 ?HR 80s ?98% RA ?CBG 136 ?Lt AC 20g. ?

## 2021-08-28 ENCOUNTER — Observation Stay (HOSPITAL_COMMUNITY): Payer: PPO

## 2021-08-28 DIAGNOSIS — N179 Acute kidney failure, unspecified: Secondary | ICD-10-CM | POA: Diagnosis present

## 2021-08-28 DIAGNOSIS — S42201A Unspecified fracture of upper end of right humerus, initial encounter for closed fracture: Secondary | ICD-10-CM | POA: Diagnosis present

## 2021-08-28 DIAGNOSIS — Z79899 Other long term (current) drug therapy: Secondary | ICD-10-CM | POA: Diagnosis not present

## 2021-08-28 DIAGNOSIS — I959 Hypotension, unspecified: Secondary | ICD-10-CM | POA: Diagnosis present

## 2021-08-28 DIAGNOSIS — E871 Hypo-osmolality and hyponatremia: Secondary | ICD-10-CM | POA: Diagnosis present

## 2021-08-28 DIAGNOSIS — Z7902 Long term (current) use of antithrombotics/antiplatelets: Secondary | ICD-10-CM | POA: Diagnosis not present

## 2021-08-28 DIAGNOSIS — E876 Hypokalemia: Secondary | ICD-10-CM | POA: Diagnosis present

## 2021-08-28 DIAGNOSIS — I459 Conduction disorder, unspecified: Secondary | ICD-10-CM | POA: Diagnosis present

## 2021-08-28 DIAGNOSIS — R296 Repeated falls: Secondary | ICD-10-CM | POA: Diagnosis present

## 2021-08-28 DIAGNOSIS — Z853 Personal history of malignant neoplasm of breast: Secondary | ICD-10-CM | POA: Diagnosis not present

## 2021-08-28 DIAGNOSIS — I110 Hypertensive heart disease with heart failure: Secondary | ICD-10-CM | POA: Diagnosis present

## 2021-08-28 DIAGNOSIS — I951 Orthostatic hypotension: Secondary | ICD-10-CM | POA: Diagnosis present

## 2021-08-28 DIAGNOSIS — S51011A Laceration without foreign body of right elbow, initial encounter: Secondary | ICD-10-CM | POA: Diagnosis present

## 2021-08-28 DIAGNOSIS — K219 Gastro-esophageal reflux disease without esophagitis: Secondary | ICD-10-CM | POA: Diagnosis present

## 2021-08-28 DIAGNOSIS — R531 Weakness: Secondary | ICD-10-CM

## 2021-08-28 DIAGNOSIS — E860 Dehydration: Secondary | ICD-10-CM | POA: Diagnosis present

## 2021-08-28 DIAGNOSIS — W19XXXA Unspecified fall, initial encounter: Secondary | ICD-10-CM | POA: Diagnosis present

## 2021-08-28 DIAGNOSIS — Z9221 Personal history of antineoplastic chemotherapy: Secondary | ICD-10-CM | POA: Diagnosis not present

## 2021-08-28 DIAGNOSIS — I2510 Atherosclerotic heart disease of native coronary artery without angina pectoris: Secondary | ICD-10-CM | POA: Diagnosis present

## 2021-08-28 DIAGNOSIS — R55 Syncope and collapse: Secondary | ICD-10-CM | POA: Diagnosis not present

## 2021-08-28 DIAGNOSIS — I714 Abdominal aortic aneurysm, without rupture, unspecified: Secondary | ICD-10-CM | POA: Diagnosis present

## 2021-08-28 DIAGNOSIS — E785 Hyperlipidemia, unspecified: Secondary | ICD-10-CM | POA: Diagnosis present

## 2021-08-28 DIAGNOSIS — I5032 Chronic diastolic (congestive) heart failure: Secondary | ICD-10-CM | POA: Diagnosis present

## 2021-08-28 DIAGNOSIS — D649 Anemia, unspecified: Secondary | ICD-10-CM | POA: Diagnosis present

## 2021-08-28 DIAGNOSIS — Z23 Encounter for immunization: Secondary | ICD-10-CM | POA: Diagnosis not present

## 2021-08-28 DIAGNOSIS — S42202A Unspecified fracture of upper end of left humerus, initial encounter for closed fracture: Secondary | ICD-10-CM | POA: Diagnosis present

## 2021-08-28 DIAGNOSIS — I5042 Chronic combined systolic (congestive) and diastolic (congestive) heart failure: Secondary | ICD-10-CM | POA: Diagnosis present

## 2021-08-28 DIAGNOSIS — E878 Other disorders of electrolyte and fluid balance, not elsewhere classified: Secondary | ICD-10-CM | POA: Diagnosis present

## 2021-08-28 DIAGNOSIS — S61411A Laceration without foreign body of right hand, initial encounter: Secondary | ICD-10-CM | POA: Diagnosis present

## 2021-08-28 LAB — CBC WITH DIFFERENTIAL/PLATELET
Abs Immature Granulocytes: 0.02 10*3/uL (ref 0.00–0.07)
Basophils Absolute: 0 10*3/uL (ref 0.0–0.1)
Basophils Relative: 0 %
Eosinophils Absolute: 0.1 10*3/uL (ref 0.0–0.5)
Eosinophils Relative: 1 %
HCT: 23.6 % — ABNORMAL LOW (ref 36.0–46.0)
Hemoglobin: 8.1 g/dL — ABNORMAL LOW (ref 12.0–15.0)
Immature Granulocytes: 0 %
Lymphocytes Relative: 14 %
Lymphs Abs: 0.8 10*3/uL (ref 0.7–4.0)
MCH: 32.5 pg (ref 26.0–34.0)
MCHC: 34.3 g/dL (ref 30.0–36.0)
MCV: 94.8 fL (ref 80.0–100.0)
Monocytes Absolute: 0.6 10*3/uL (ref 0.1–1.0)
Monocytes Relative: 10 %
Neutro Abs: 4 10*3/uL (ref 1.7–7.7)
Neutrophils Relative %: 75 %
Platelets: 108 10*3/uL — ABNORMAL LOW (ref 150–400)
RBC: 2.49 MIL/uL — ABNORMAL LOW (ref 3.87–5.11)
RDW: 14.4 % (ref 11.5–15.5)
WBC: 5.4 10*3/uL (ref 4.0–10.5)
nRBC: 0.4 % — ABNORMAL HIGH (ref 0.0–0.2)

## 2021-08-28 LAB — PROTIME-INR
INR: 1 (ref 0.8–1.2)
Prothrombin Time: 13.2 seconds (ref 11.4–15.2)

## 2021-08-28 LAB — CK: Total CK: 344 U/L — ABNORMAL HIGH (ref 38–234)

## 2021-08-28 LAB — IRON AND TIBC
Iron: 73 ug/dL (ref 28–170)
Saturation Ratios: 22 % (ref 10.4–31.8)
TIBC: 333 ug/dL (ref 250–450)
UIBC: 260 ug/dL

## 2021-08-28 LAB — COMPREHENSIVE METABOLIC PANEL
ALT: 10 U/L (ref 0–44)
AST: 21 U/L (ref 15–41)
Albumin: 3.2 g/dL — ABNORMAL LOW (ref 3.5–5.0)
Alkaline Phosphatase: 46 U/L (ref 38–126)
Anion gap: 9 (ref 5–15)
BUN: 13 mg/dL (ref 8–23)
CO2: 26 mmol/L (ref 22–32)
Calcium: 8.6 mg/dL — ABNORMAL LOW (ref 8.9–10.3)
Chloride: 93 mmol/L — ABNORMAL LOW (ref 98–111)
Creatinine, Ser: 0.76 mg/dL (ref 0.44–1.00)
GFR, Estimated: >60 mL/min (ref >60)
Glucose, Bld: 109 mg/dL — ABNORMAL HIGH (ref 70–99)
Potassium: 3 mmol/L — ABNORMAL LOW (ref 3.5–5.1)
Sodium: 128 mmol/L — ABNORMAL LOW (ref 135–145)
Total Bilirubin: 1.1 mg/dL (ref 0.3–1.2)
Total Protein: 5.4 g/dL — ABNORMAL LOW (ref 6.5–8.1)

## 2021-08-28 LAB — OSMOLALITY: Osmolality: 268 mOsm/kg — ABNORMAL LOW (ref 275–295)

## 2021-08-28 LAB — FERRITIN: Ferritin: 22 ng/mL (ref 11–307)

## 2021-08-28 LAB — TROPONIN I (HIGH SENSITIVITY): Troponin I (High Sensitivity): 11 ng/L (ref <18)

## 2021-08-28 LAB — RETICULOCYTES
Immature Retic Fract: 15.2 % (ref 2.3–15.9)
RBC.: 2.49 MIL/uL — ABNORMAL LOW (ref 3.87–5.11)
Retic Count, Absolute: 48.6 10*3/uL (ref 19.0–186.0)
Retic Ct Pct: 2 % (ref 0.4–3.1)

## 2021-08-28 LAB — MAGNESIUM: Magnesium: 1.9 mg/dL (ref 1.7–2.4)

## 2021-08-28 LAB — TSH: TSH: 2.075 u[IU]/mL (ref 0.350–4.500)

## 2021-08-28 LAB — FOLATE: Folate: 17.3 ng/mL (ref >5.9)

## 2021-08-28 MED ORDER — POTASSIUM CHLORIDE CRYS ER 20 MEQ PO TBCR
40.0000 meq | EXTENDED_RELEASE_TABLET | Freq: Once | ORAL | Status: AC
  Administered 2021-08-28: 40 meq via ORAL
  Filled 2021-08-28: qty 2

## 2021-08-28 MED ORDER — POLYSACCHARIDE IRON COMPLEX 150 MG PO CAPS
150.0000 mg | ORAL_CAPSULE | Freq: Every day | ORAL | Status: DC
  Administered 2021-08-28 – 2021-08-30 (×3): 150 mg via ORAL
  Filled 2021-08-28 (×3): qty 1

## 2021-08-28 MED ORDER — POTASSIUM CHLORIDE CRYS ER 20 MEQ PO TBCR
40.0000 meq | EXTENDED_RELEASE_TABLET | Freq: Once | ORAL | Status: AC
Start: 2021-08-28 — End: 2021-08-28
  Administered 2021-08-28: 40 meq via ORAL
  Filled 2021-08-28: qty 2

## 2021-08-28 MED ORDER — CLOPIDOGREL BISULFATE 75 MG PO TABS
75.0000 mg | ORAL_TABLET | Freq: Every day | ORAL | Status: DC
  Administered 2021-08-28 – 2021-08-30 (×3): 75 mg via ORAL
  Filled 2021-08-28 (×3): qty 1

## 2021-08-28 MED ORDER — IOHEXOL 350 MG/ML SOLN
55.0000 mL | Freq: Once | INTRAVENOUS | Status: AC | PRN
  Administered 2021-08-28: 55 mL via INTRAVENOUS

## 2021-08-28 MED ORDER — SIMVASTATIN 20 MG PO TABS
40.0000 mg | ORAL_TABLET | Freq: Every day | ORAL | Status: DC
  Administered 2021-08-28 – 2021-08-29 (×2): 40 mg via ORAL
  Filled 2021-08-28 (×2): qty 2

## 2021-08-28 NOTE — Assessment & Plan Note (Signed)
?  ?#)   Acute on chronic anemia: In the context of documented history of chronic iron deficiency anemia with baseline hemoglobin 10-12, presenting hemoglobin slightly lower than his baseline, with initial value 9.1.  This is associated with normocytic/normochromic findings as well as nonelevated RDW.  No clinical evidence of overt active bleed at this time.  Of note, she is on oral iron supplementation as an outpatient.  Potential acute exacerbation of her chronic anemia in the setting of interval worsening of renal function, as further detailed below.  INR 1.0. ?  ?Plan: Add on the following laboratory studies: Iron studies, F02 level, folic acid level, reticulocyte count.  Repeat INR in the morning.  Repeat CBC in the morning.  Continue home oral iron supplementation. ?  ?

## 2021-08-28 NOTE — Assessment & Plan Note (Signed)
?  ?  ?#)   Hypotension: Initially hypotensive, with systolic blood pressures in the 80s, which is improved following interval administration of IV fluids, consistent with clinical picture of dehydration, as further detailed above.  No evidence of underlying infectious process, as further detailed above.  ACS is felt to be less likely, including in the absence of any recent chest pain or EKG changes, while initial troponin nonelevated.  Will further assess for acute pulmonary embolism in the setting of known history of breast cancer, with D-dimer found to be elevated this evening.  This is in the context of documented history of essential pretension, for which the patient is on Coreg, losartan, and HCTZ at home. ?  ?Plan: CTA chest, as above.  Continuous IV fluids, as above.  Hold home HCTZ, losartan, and Coreg for now.  Close monitoring of ensuing blood pressure via routine vital signs.  Monitor on symmetry.  Trend troponin. ?  ?

## 2021-08-28 NOTE — Assessment & Plan Note (Signed)
?  ?  ?#)   Acute kidney injury: Presenting serum creatinine 1.17 relative to most recent prior value of 0.68 on 08/03/2021.  Suspect that this is prerenal as a consequence of dehydration given the patient's report of recent decline in oral intake.  Urinalysis with microscopy shows the presence of hyaline cast, consistent with the picture of dehydration.  Otherwise, no evidence of urinary casts, as further detailed above.  There may also be pharmacologic exacerbation as a consequence of home losartan and further exacerbation of suspected dehydration in the setting of home HCTZ.  Differential also includes the possibility of renal hypoperfusion in the setting of presenting mild hypotension, which is improved with IV fluids, also suggestive of an element of dehydration. ?  ?Plan: Add on random urine Simas was random urine creatinine.  Monitor strict I's and O's and daily weights.  Tempt avoid nephrotoxic agents.  Hold home losartan, HCTZ.  Repeat BMP in the morning.  Hold home gabapentin for now.  Continuous LR, as above.  Add on CPK level. ?  ?  ?

## 2021-08-28 NOTE — Assessment & Plan Note (Signed)
?  ?#)   Chronic diastolic heart failure: documented history of such, with most recent echocardiogram performed in March 2015, notable for LVEF 55 6% as well as grade 1 diastolic dysfunction in addition to mild mitral regurgitation and mild tricuspid regurgitation. No clinical evidence to suggest acutely decompensated heart failure at this time. home diuretic regimen reportedly consists of the following: HCTZ.  ?  ?  ?Plan: monitor strict I's & O's and daily weights. Repeat BMP and serum magnesium level in AM.  Hold home HCTZ for now. ?  ?

## 2021-08-28 NOTE — Evaluation (Signed)
Physical Therapy Evaluation ?Patient Details ?Name: [name] ?MRN: [id number] ?DOB: [birth year] ?Today's Date: 08/28/2021 ? ?History of Present Illness ? Pt is a 71 yo Female presenting to the ED 08/27/21 after a loss of consciousness, falls x6 in 24 hours, and persistent dizziness/lightheadedness x3 days. [place]g (+) for mild displaced fx of the R proximal humerus. PMH includes breast CA, HTN, CAD, CHF, AAA, PAD.  ?Clinical Impression ? Patient presented to the ED on 08/28/21 following a syncopal episode and lightheadedness x3 days. Pt's impairments include decreased cardiopulmonary conditioning, balance, strength, and activity tolerance. These impairments are limiting her ability to safely and independently transfer, ambulate, and navigate stairs. Patient requires close min guard A to supervision with all functional mobility. Pt had a LOB that required min A to recover with turning in room. Pt systolic BP dropped to 84 mmHg when standing at EOB. SPT recommending SNF upon D/C due to mobility deficits. PT will continue to follow acutely to maximize pt's independence and safety with functional mobility. ?   ?   ? ?Recommendations for follow up therapy are one component of a multi-disciplinary discharge planning process, led by the attending physician.  Recommendations may be updated based on patient status, additional functional criteria and insurance authorization. ? ?Follow Up Recommendations Skilled nursing-short term rehab (<3 hours/day) ? ?  ?Assistance Recommended at Discharge Frequent or constant Supervision/Assistance  ?Patient can return home with the following ? A little help with walking and/or transfers;A little help with bathing/dressing/bathroom;Assistance with cooking/housework;Assist for transportation;Help with stairs or ramp for entrance ? ?  ?Equipment Recommendations None recommended by PT  ?Recommendations for Other Services ?    ?  ?Functional Status Assessment Patient has had a recent decline  in their functional status and demonstrates the ability to make significant improvements in function in a reasonable and predictable amount of time.  ? ?  ?Precautions / Restrictions Precautions ?Precautions: Fall ?Precaution Comments: orthstatic hypotension ?Required Braces or Orthoses: Sling (R humerus fracture) ?Restrictions ?Weight Bearing Restrictions: Yes ?RUE Weight Bearing: Non weight bearing (humerus fracture)  ? ?  ? ?Mobility ? Bed Mobility ?Overal bed mobility: Needs Assistance ?Bed Mobility: Sit to Supine (pt in hallway bathroom upon beginning of session) ?  ?  ?  ?Sit to supine: Min guard ?  ?General bed mobility comments: pt requring increased time and min guard assist for safety on return to supine ?  ? ?Transfers ?Overall transfer level: Needs assistance ?Equipment used: None ?Transfers: Sit to/from Stand ?Sit to Stand: Min guard ?  ?  ?  ?  ?  ?General transfer comment: close min guard assist with sit<>stand for steadying and safety. Pt systolic BP in sitting at EOB after pt ambulated to bathroom was 120s mmHg. Once standing at EOB, systolic dropped to [id number]. Pt had no complaints of dizziness. ?  ? ?Ambulation/Gait ?Ambulation/Gait assistance: Supervision, Min assist ?Gait Distance (Feet): 20 Feet ?Assistive device: None ?Gait Pattern/deviations: Decreased stride length ?  ?  ?Pre-gait activities: marching in place ?General Gait Details: Eval began as pt walking back to room from bathroom. Pt required supervision while walking to her room while using the railing in hallway and walls to hold on to for support. Pt had one LOB in room with taking steps next to bed that required min A to correct. Pt had increased sway with ambulation and difficulty marching in plance without losing her balance ? ?Stairs ?  ?  ?  ?  ?  ? ?Wheelchair Mobility ?  ? ?  Modified Rankin (Stroke Patients Only) ?  ? ?  ? ?Balance Overall balance assessment: Needs assistance ?Sitting-balance support: Single extremity supported,  No upper extremity supported, Feet unsupported ?Sitting balance-Leahy Scale: Fair ?Sitting balance - Comments: pt able to sit at EOB with left UE support and without for short periods of time ?  ?Standing balance support: No upper extremity supported, During functional activity ?Standing balance-Leahy Scale: Fair ?Standing balance comment: pt required close min guard A with standing for steadying due to pt swaying in standing. ?  ?  ?  ?  ?  ?  ?  ?  ?  ?  ?  ?   ? ? ? ?Pertinent Vitals/Pain Pain Assessment ?Pain Assessment: Faces ?Pain Score: 5  ?Pain Location: R shoulder ?Pain Descriptors / Indicators: Discomfort, Grimacing ?Pain Intervention(s): Limited activity within patient's tolerance, Monitored during session  ? ? ?Home Living Family/patient expects to be discharged to:: Private residence ?Living Arrangements: Spouse/significant other (spouse in rehab) ?Available Help at Discharge: Neighbor;Friend(s);Available PRN/intermittently ?Type of Home: House ?Home Access: Stairs to enter ?Entrance Stairs-Rails: Left ?Entrance Stairs-Number of Steps: 3 ?  ?Home Layout: One level ?Home Equipment: Conservation officer, nature (2 wheels);Cane - single point;BSC/3in1 ?   ?  ?Prior Function Prior Level of Function : Independent/Modified Independent ?  ?  ?  ?  ?  ?  ?Mobility Comments: has been using RW the last couple days ?ADLs Comments: increased time  but has been managing, independent IADLs and driving ?  ? ? ?Hand Dominance  ? Dominant Hand: Right ? ?  ?Extremity/Trunk Assessment  ? Upper Extremity Assessment ?Upper Extremity Assessment: Defer to OT evaluation ?  ? ?Lower Extremity Assessment ?Lower Extremity Assessment: Generalized weakness ?  ? ?Cervical / Trunk Assessment ?Cervical / Trunk Assessment: Kyphotic  ?Communication  ? Communication: No difficulties  ?Cognition Arousal/Alertness: Awake/alert ?Behavior During Therapy: [facility] for tasks assessed/performed ?Overall Cognitive Status: Within Functional Limits for tasks  assessed ?  ?  ?  ?  ?  ?  ?  ?  ?  ?  ?  ?  ?  ?  ?  ?  ?  ?  ?  ? ?  ?General Comments General comments (skin integrity, edema, etc.): systolic BP dropped to 84 in standing. ? ?  ?Exercises    ? ?Assessment/Plan  ?  ?PT Assessment Patient needs continued PT services  ?PT Problem List Decreased strength;Decreased balance;Decreased activity tolerance;Decreased range of motion;Decreased mobility;Decreased safety awareness;Cardiopulmonary status limiting activity;Pain ? ?   ?  ?PT Treatment Interventions DME instruction;Gait training;Stair training;Functional mobility training;Therapeutic activities;Therapeutic exercise;Balance training;Neuromuscular re-education;Patient/family education   ? ?PT Goals (Current goals can be found in the Care Plan section)  ?Acute Rehab PT Goals ?Patient Stated Goal: to go to SNF where husband is located ?PT Goal Formulation: With patient ?Time For Goal Achievement: 09/04/21 ?Potential to Achieve Goals: Good ? ?  ?Frequency Min 2X/week ?  ? ? ?Co-evaluation   ?  ?  ?  ?  ? ? ?  ?AM-PAC PT "6 Clicks" Mobility  ?Outcome Measure Help needed turning from your back to your side while in a flat bed without using bedrails?: None ?Help needed moving from lying on your back to sitting on the side of a flat bed without using bedrails?: A Little ?Help needed moving to and from a bed to a chair (including a wheelchair)?: A Little ?Help needed standing up from a chair using your arms (e.g., wheelchair or bedside chair)?: A Little ?Help  needed to walk in hospital room?: A Little ?Help needed climbing 3-5 steps with a railing? : A Lot ?6 Click Score: 18 ? ?  ?End of Session Equipment Utilized During Treatment: Gait belt ?Activity Tolerance: Treatment limited secondary to medical complications (Comment) (orthostatic hypotension) ?Patient left: in bed;with call bell/phone within reach;Other (comment) (on stretcher in ED) ?Nurse Communication: Mobility status;Other (comment) (orthostatic hypotension) ?PT  Visit Diagnosis: Unsteadiness on feet (R26.81);Other abnormalities of gait and mobility (R26.89);Muscle weakness (generalized) (M62.81);Other symptoms and signs involving the nervous system (R29.898);Pain ?P

## 2021-08-28 NOTE — Assessment & Plan Note (Signed)
?  ?#)   Hypomagnesemia: Presenting serum and exam level 1.4, likely contribution from recent decline in oral intake.  She has received 2 g of IV magnesium sulfate in the emergency department this evening. ?  ?Plan: Monitor on symmetry.  Repeat serum magnesium level in the morning. ?  ?   ?  ?  ?

## 2021-08-28 NOTE — Evaluation (Signed)
Occupational Therapy Evaluation Patient Details Name: [name] MRN: [id number] DOB: 1951-05-25 Today's Date: 08/28/2021   History of Present Illness Pt is a 71 yo Female presenting to the ED 08/27/21 after a loss of consciousness, falls x6 in 24 hours, and persistent dizziness/lightheadedness x3 days. Imaigng (+) for mild displaced fx of the R proximal humerus. PMH includes breast CA, HTN, CAD, CHF, AAA, PAD.   Clinical Impression   Pt admitted for above and limited by problem list below, including orthostatic hypotension, impaired balance, decreased activity tolerance, pain, decreased functional use of R dominant UE in sling (proximal humerus fx), and decreased awareness, safety and problem solving.  She currently requires min assist for bed mobility, up to mod assist for UB ADLs and max assist for LB ADLs.  Educated on sling positioning for increased support and decreased pain, use and exercises (hand, wrist at this time).  She demonstrated SBP drop from 111/68 to 93/55 from supine to sitting.  Based on performance today, pt will benefit from continued OT services acutely and after dc at SNF level to optimize independence, safety and return to PLOF.      Recommendations for follow up therapy are one component of a multi-disciplinary discharge planning process, led by the attending physician.  Recommendations may be updated based on patient status, additional functional criteria and insurance authorization.   Follow Up Recommendations  Skilled nursing-short term rehab (<3 hours/day)    Assistance Recommended at Discharge Frequent or constant Supervision/Assistance  Patient can return home with the following A little help with walking and/or transfers;A lot of help with bathing/dressing/bathroom;Assistance with cooking/housework;Assist for transportation;Help with stairs or ramp for entrance    Functional Status Assessment  Patient has had a recent decline in their functional status and  demonstrates the ability to make significant improvements in function in a reasonable and predictable amount of time.  Equipment Recommendations  Other (comment) (defer)    Recommendations for Other Services       Precautions / Restrictions Precautions Precautions: Fall Precaution Comments: orthstatic hypotension Required Braces or Orthoses: Sling Restrictions Weight Bearing Restrictions: Yes RUE Weight Bearing: Non weight bearing      Mobility Bed Mobility Overal bed mobility: Needs Assistance Bed Mobility: Sit to Supine, Supine to Sit     Supine to sit: Min assist Sit to supine: Min guard   General bed mobility comments: trunk support to ascend, min guard for safety to return supine    Transfers                          Balance Overall balance assessment: Needs assistance Sitting-balance support: Single extremity supported, No upper extremity supported, Feet unsupported Sitting balance-Leahy Scale: Fair Sitting balance - Comments: supervision statically                                   ADL either performed or assessed with clinical judgement   ADL Overall ADL's : Needs assistance/impaired     Grooming: Moderate assistance;Sitting           Upper Body Dressing : Moderate assistance;Sitting   Lower Body Dressing: Maximal assistance;Sitting/lateral leans     Toilet Transfer Details (indicate cue type and reason): deferred due to orthostatic hypotension         Functional mobility during ADLs: Minimal assistance (to EOB) General ADL Comments: repositioned sling, educated on positioning for increased support  and decreased pain.     Vision   Vision Assessment?: No apparent visual deficits     Perception     Praxis      Pertinent Vitals/Pain Pain Assessment Pain Assessment: 0-10 Pain Score: 6  Pain Location: R shoulder Pain Descriptors / Indicators: Discomfort, Grimacing Pain Intervention(s): Limited activity within  patient's tolerance, Monitored during session, Repositioned     Hand Dominance Right   Extremity/Trunk Assessment Upper Extremity Assessment Upper Extremity Assessment: RUE deficits/detail;LUE deficits/detail RUE Deficits / Details: proximal humerus fracture in sling, WFL hand/wrist AROM, sensation baseline RUE: Unable to fully assess due to immobilization;Unable to fully assess due to pain RUE Sensation: history of peripheral neuropathy RUE Coordination: decreased fine motor;decreased gross motor LUE Deficits / Details: WFL LUE Sensation: history of peripheral neuropathy   Lower Extremity Assessment Lower Extremity Assessment: Defer to PT evaluation   Cervical / Trunk Assessment Cervical / Trunk Assessment: Kyphotic   Communication Communication Communication: No difficulties   Cognition Arousal/Alertness: Awake/alert Behavior During Therapy: WFL for tasks assessed/performed Overall Cognitive Status: Impaired/Different from baseline Area of Impairment: Awareness, Problem solving                           Awareness: Emergent Problem Solving: Slow processing, Requires verbal cues       General Comments  orthostatic drop from 111/65 to 93/55 from supine to sitting. Pt reports mild dizziness.    Exercises     Shoulder Instructions      Home Living Family/patient expects to be discharged to:: Private residence Living Arrangements: Spouse/significant other (spouse in rehab) Available Help at Discharge: Neighbor;Friend(s);Available PRN/intermittently Type of Home: House Home Access: Stairs to enter [employer] of Steps: 3 Entrance Stairs-Rails: Left Home Layout: One level     Bathroom Shower/Tub: Chief Strategy Officer: Standard     Home Equipment: Agricultural consultant (2 wheels);Cane - single point;BSC/3in1          Prior Functioning/Environment Prior Level of Function : Independent/Modified Independent;History of Falls (last six  months);Driving             Mobility Comments: has been using RW the last couple days ADLs Comments: increased time  but has been managing, independent IADLs and driving        OT Problem List: Decreased strength;Decreased activity tolerance;Impaired balance (sitting and/or standing);Pain;Impaired UE functional use;Decreased safety awareness;Decreased coordination;Decreased cognition;Decreased knowledge of use of DME or AE;Decreased knowledge of precautions      OT Treatment/Interventions: Self-care/ADL training;Therapeutic exercise;DME and/or AE instruction;Therapeutic activities;Patient/family education;Balance training    OT Goals(Current goals can be found in the care plan section) Acute Rehab OT Goals Patient Stated Goal: get better before going home OT Goal Formulation: With patient Time For Goal Achievement: 09/11/21 Potential to Achieve Goals: Good  OT Frequency: Min 2X/week    Co-evaluation              AM-PAC OT "6 Clicks" Daily Activity     Outcome Measure Help from another person eating meals?: A Little Help from another person taking care of personal grooming?: A Little Help from another person toileting, which includes using toliet, bedpan, or urinal?: A Lot Help from another person bathing (including washing, rinsing, drying)?: A Lot Help from another person to put on and taking off regular upper body clothing?: A Lot Help from another person to put on and taking off regular lower body clothing?: A Lot 6 Click Score: 14  End of Session Equipment Utilized During Treatment: Other (comment) (sling) Nurse Communication: Mobility status  Activity Tolerance: Patient tolerated treatment well Patient left: in bed;with call bell/phone within reach  OT Visit Diagnosis: Muscle weakness (generalized) (M62.81);Pain;History of falling (Z91.81);Unsteadiness on feet (R26.81) Pain - Right/Left: Right Pain - part of body: Shoulder                Time: 8657-8469 OT  Time Calculation (min): 17 min Charges:  OT General Charges $OT Visit: 1 Visit OT Evaluation $OT Eval Moderate Complexity: 1 Mod  Barry Brunner, OT Acute Rehabilitation Services Pager [phone] Office [phone]   Chancy Milroy 08/28/2021, 12:37 PM

## 2021-08-28 NOTE — Progress Notes (Signed)
?Progress note ? ? ?[name] EPP:[id number] DOB: 25-Nov-1950 DOA: 08/27/2021 ? ?PCP: Reynold Bowen, MD  ?Patient coming from: home  ? ? ?Chief Complaint: Episode of loss of consciousness ? ?HPI: [name] is a 71 y.o. female with medical history significant for breast cancer status postchemotherapy, chronic iron deficiency anemia associated baseline hemoglobin 60-63, chronic diastolic heart failure, hypertension, hyperlipidemia, chronic hyponatremia with baseline serum sodium 130-134, who is admitted to [facility] on 08/27/2021 for further evaluation management of syncope after presenting from home to [facility] ED complaining of episode of loss of consciousness.  ? ?The patient reports that she has been experiencing intermittent dizziness over the last 3 days, when she moves from a seated to a standing position.  She subsequently experienced a single episode of loss of consciousness earlier today when rising from a seated to a standing position, this was associated with preceding dizziness, lightheadedness, and subjective sensation of impending loss of consciousness.  She reports that this episode of syncope resulted in a ground-level fall to the floor, in which she struck her right side, and believes that she likely also struck her head.  This episode was unwitnessed, and while the patient is not entirely sure as to the exact duration during which she was unconscious, she believes that it was only for a few seconds.  Not associate with any chest pain, shortness of breath, palpitations, nausea, vomiting.  No associated tongue biting or loss of bowel/bladder function.  Denies any ensuing episodes of loss of consciousness.  Is on daily Plavix, but otherwise on no additional blood thinners. ? ?Denies any associated or ensuing headache. no associated acute focal weakness, acute focal numbness, paresthesias, facial droop, slurred speech, expressive aphasia, acute change in vision, dysphagia, vertigo. ? ?Denies  any recent worsening of edema in the lower extremities, nor any calf tenderness or new lower extremity edema.  Denies any recent preceding trauma, but does acknowledge a history of breast cancer, for which she completed her last session of chemotherapy approximately 2 months ago.  Denies any personal history of DVT/PE.  No recent travel.  ? ?Denies any recent subjective fever, chills, rigors, or generalized myalgias.  No recent cough, rash, neck stiffness, abdominal pain, or diarrhea.  No recent dysuria or gross hematuria.  No recent melena or hematochezia. ? ?Stemming from her loss of consciousness earlier today, she reports near constant right shoulder discomfort, worse with attempting to move the right upper extremity.  Denies any associated numbness or paresthesias. ? ?Over the last week leading up to onset of intermittent dizziness, the patient reports a decline in oral intake of both food and water, as a consequence of associated decrease in appetite.  She lives at home, as her husband is currently residing in SNF for rehab.  ? ?She has a history of coronary artery disease status post bare-metal stents.  She also has a documented history of chronic diastolic heart failure, with most recent echo per chart review occurring in March 2015, at which time LVEF was 55 to 60%, no focal motion antibiotics, grade 1 diastolic dysfunction, mild mitral vegetation and mild tricuspid regurgitation.  She is on HCTZ, but otherwise not on a diuretic medications.  ? ?Medical history also notable for essential pretension, for which she is also on Coreg as well as losartan. ? ?Per chart review, between December 2022 and 08/03/2021, serum sodium is ranged between 1 31-1 34.  Additionally, per chart review, from December 2022 through 08/03/2021, hemoglobin has been in  the range of 10.1-11.5. ? ? ? ?ED Course:  ?Vital signs in the ED were notable for the following: Afebrile; heart rate 67-76; initial blood pressure 86/54, sodium  improving to 109/60 following interval administration of IV fluids, as further detailed below; respiratory rate 18 is 24, oxygen saturation 97 to 100% on room air. ? ?Labs were notable for the following: CMP notable for the following: Sodium 126 compared to most recent prior value 134 on 08/03/2021, potassium 3.4, chloride 90, creatinine 1.17 relative to most recent prior serum creatinine did 1-0.68 on 08/03/2021, glucose 130, liver enzymes within normal limits.  Serum magnesium level 1.4.  Urinalysis showed no white blood cells no red blood cells, no protein, but was positive for hyaline cast.  Initial high-sensitivity troponin I 8.  CBC notable for white cell count 8000, hemoglobin 9.1 associated normocytic/normochromic findings as well as nonelevated RDW, compared to most recent prior hemoglobin of 11.5 on 08/03/2021, platelet count 136.  D-dimer 15.63.  INR 1.0. ? ?Imaging and additional notable ED work-up: EKG shows sinus rhythm with nonspecific intraventricular conduction delay, heart rate 75, no evidence of T wave or ST changes, clear no evidence of ST elevation.  Chest x-ray showed no evidence of acute cardiopulmonary process.  Plain films of the right hand and right elbow showed no evidence of acute fracture or dislocation.  Plain films of the right shoulder showed proximal right humeral fracture without evidence of dislocation.  Noncontrast CT head showed no evidence of acute intracranial process, including no evidence of intracranial hemorrhage or acute infarct.  CT cervical spine showed no evidence of acute fracture or subluxation injury to the cervical spine. ? ?While in the ED, the following were administered: Lactated Ringer's x1 L bolus followed by continuous LR at 100 cc/h.  Magnesium sulfate 2 g IV over 2 hours x 1 dose now. ? ?Subsequently, the patient was admitted for overnight observation for further evaluation management presenting syncope, with presentation associated with several laboratory  abnormalities, including acute on chronic anemia, acute on chronic hyponatremia, acute kidney injury, hypomagnesemia, hypokalemia, and with presentation also notable for generalized weakness and acute proximal right humerus fracture. ? ? ? ? ?Objective  ? ? ?Physical Exam: ?Vitals:  ? 08/28/21 1045 08/28/21 1200 08/28/21 1230 08/28/21 1315  ?BP: 140/69 (!) 156/66 (!) 168/74 (!) 145/71  ?Pulse: 70 67 70 73  ?Resp: (!) 22 18 (!) 21 (!) 26  ?Temp:      ?TempSrc:      ?SpO2: 100% 97% 97% 98%  ?Weight:      ?Height:      ? ? ?General: appears to be stated age; alert, oriented ?Skin: warm, dry, no rash ?Head:  AT/[place] ?Mouth:  Oral mucosa membranes appear dry, normal dentition ?Neck: supple; trachea midline ?Heart:  RRR; did not appreciate any M/R/G ?Lungs: CTAB, did not appreciate any wheezes, rales, or rhonchi ?Abdomen: + BS; soft, ND, NT ?Vascular: 2+ pedal pulses b/l; 2+ radial pulses b/l ?Extremities: no peripheral edema, no muscle wasting ?Neuro: Deferred strength assessment of the right upper extremity in the setting of acute proximal right humerus fracture; 5 out of 5 strength in right lower extremity and 5 out of 5 strength in upper and lower extremity on left; sensation intact in upper and lower extremities bilaterally. ? ? ? ? ?Labs on Admission: I have personally reviewed following labs and imaging studies ? ?CBC: ?Recent Labs  ?Lab 08/27/21 ?1830 08/28/21 ?0419  ?WBC 8.0 5.4  ?NEUTROABS 6.4 4.0  ?HGB  9.1* 8.1*  ?HCT 26.3* 23.6*  ?MCV 95.3 94.8  ?PLT 136* 108*  ? ?Basic Metabolic Panel: ?Recent Labs  ?Lab 08/27/21 ?1830 08/28/21 ?0419  ?NA 126* 128*  ?K 3.4* 3.0*  ?CL 90* 93*  ?CO2 26 26  ?GLUCOSE 130* 109*  ?BUN 19 13  ?CREATININE 1.17* 0.76  ?CALCIUM 8.6* 8.6*  ?MG 1.4* 1.9  ? ?GFR: ?Estimated Creatinine Clearance: 45.6 mL/min (by C-G formula based on SCr of 0.76 mg/dL). ?Liver Function Tests: ?Recent Labs  ?Lab 08/27/21 ?1830 08/28/21 ?0419  ?AST 29 21  ?ALT 15 10  ?ALKPHOS 44 46  ?BILITOT 0.9 1.1  ?PROT 5.7*  5.4*  ?ALBUMIN 3.5 3.2*  ? ?No results for input(s): LIPASE, AMYLASE in the last 168 hours. ?No results for input(s): AMMONIA in the last 168 hours. ?Coagulation Profile: ?Recent Labs  ?Lab 08/27/21 ?1830 08/28/21

## 2021-08-28 NOTE — Assessment & Plan Note (Signed)
?  ?#)   Acute proximal right humerus fracture: In the setting of acute right shoulder discomfort that started after striking the right shoulder as component of her syncopal episode earlier today, plain films of the right shoulder show proximal right humeral fracture without evidence of dislocation.  Right upper extremity appears neurovascularly intact at this time.  Orthopedic technician has been consulted, and is provided sling immobilizer.  There did not appear to be any indications for urgent surgical intervention. ?  ?Plan: Sling immobilizer, as above.  Consider discussing with orthopedic surgery in the morning.  Prn acetaminophen. ?  ?

## 2021-08-28 NOTE — Assessment & Plan Note (Signed)
?  ?#)   Hypokalemia: Presenting serum potassium level 3.4, suspect contribution from suboptimal nutrition over the last few weeks given report of associated decline in oral intake over that timeframe, as above.  Presenting concomitant hypomagnesemia also noted. ?  ?Plan: Potassium chloride 40 equivalents p.o. x1 dose now.  Repeat BMP in the morning.  Monitor on symmetry.  Further evaluation management of presenting hypomagnesemia, as above, including repeat serum magnesium level in the morning. ?  ?  ?

## 2021-08-28 NOTE — H&P (Signed)
?History and Physical  ? ? ?PLEASE NOTE THAT [employer] SOFTWARE WAS USED IN THE CONSTRUCTION OF THIS NOTE. ? ? ?DENITA LUN IEP:[id number] DOB: 1950/06/12 DOA: 08/27/2021 ? ?PCP: Reynold Bowen, MD  ?Patient coming from: home  ? ?I have personally briefly reviewed patient's old medical records in [place] ? ?Chief Complaint: Episode of loss of consciousness ? ?HPI: [name] is a 71 y.o. female with medical history significant for breast cancer status postchemotherapy, chronic iron deficiency anemia associated baseline hemoglobin 66-06, chronic diastolic heart failure, hypertension, hyperlipidemia, chronic hyponatremia with baseline serum sodium 130-134, who is admitted to [facility] on 08/27/2021 for further evaluation management of syncope after presenting from home to [facility] ED complaining of episode of loss of consciousness.  ? ?The patient reports that she has been experiencing intermittent dizziness over the last 3 days, when she moves from a seated to a standing position.  She subsequently experienced a single episode of loss of consciousness earlier today when rising from a seated to a standing position, this was associated with preceding dizziness, lightheadedness, and subjective sensation of impending loss of consciousness.  She reports that this episode of syncope resulted in a ground-level fall to the floor, in which she struck her right side, and believes that she likely also struck her head.  This episode was unwitnessed, and while the patient is not entirely sure as to the exact duration during which she was unconscious, she believes that it was only for a few seconds.  Not associate with any chest pain, shortness of breath, palpitations, nausea, vomiting.  No associated tongue biting or loss of bowel/bladder function.  Denies any ensuing episodes of loss of consciousness.  Is on daily Plavix, but otherwise on no additional blood thinners. ? ?Denies any associated or ensuing  headache. no associated acute focal weakness, acute focal numbness, paresthesias, facial droop, slurred speech, expressive aphasia, acute change in vision, dysphagia, vertigo. ? ?Denies any recent worsening of edema in the lower extremities, nor any calf tenderness or new lower extremity edema.  Denies any recent preceding trauma, but does acknowledge a history of breast cancer, for which she completed her last session of chemotherapy approximately 2 months ago.  Denies any personal history of DVT/PE.  No recent travel.  ? ?Denies any recent subjective fever, chills, rigors, or generalized myalgias.  No recent cough, rash, neck stiffness, abdominal pain, or diarrhea.  No recent dysuria or gross hematuria.  No recent melena or hematochezia. ? ?Stemming from her loss of consciousness earlier today, she reports near constant right shoulder discomfort, worse with attempting to move the right upper extremity.  Denies any associated numbness or paresthesias. ? ?Over the last week leading up to onset of intermittent dizziness, the patient reports a decline in oral intake of both food and water, as a consequence of associated decrease in appetite.  She lives at home, as her husband is currently residing in SNF for rehab.  ? ?She has a history of coronary artery disease status post bare-metal stents.  She also has a documented history of chronic diastolic heart failure, with most recent echo per chart review occurring in March 2015, at which time LVEF was 55 to 60%, no focal motion antibiotics, grade 1 diastolic dysfunction, mild mitral vegetation and mild tricuspid regurgitation.  She is on HCTZ, but otherwise not on a diuretic medications.  ? ?Medical history also notable for essential pretension, for which she is also on Coreg as well as losartan. ? ?  Per chart review, between December 2022 and 08/03/2021, serum sodium is ranged between 1 31-1 34.  Additionally, per chart review, from December 2022 through 08/03/2021,  hemoglobin has been in the range of 10.1-11.5. ? ? ? ?ED Course:  ?Vital signs in the ED were notable for the following: Afebrile; heart rate 67-76; initial blood pressure 86/54, sodium improving to 109/60 following interval administration of IV fluids, as further detailed below; respiratory rate 18 is 24, oxygen saturation 97 to 100% on room air. ? ?Labs were notable for the following: CMP notable for the following: Sodium 126 compared to most recent prior value 134 on 08/03/2021, potassium 3.4, chloride 90, creatinine 1.17 relative to most recent prior serum creatinine did 1-0.68 on 08/03/2021, glucose 130, liver enzymes within normal limits.  Serum magnesium level 1.4.  Urinalysis showed no white blood cells no red blood cells, no protein, but was positive for hyaline cast.  Initial high-sensitivity troponin I 8.  CBC notable for white cell count 8000, hemoglobin 9.1 associated normocytic/normochromic findings as well as nonelevated RDW, compared to most recent prior hemoglobin of 11.5 on 08/03/2021, platelet count 136.  D-dimer 15.63.  INR 1.0. ? ?Imaging and additional notable ED work-up: EKG shows sinus rhythm with nonspecific intraventricular conduction delay, heart rate 75, no evidence of T wave or ST changes, clear no evidence of ST elevation.  Chest x-ray showed no evidence of acute cardiopulmonary process.  Plain films of the right hand and right elbow showed no evidence of acute fracture or dislocation.  Plain films of the right shoulder showed proximal right humeral fracture without evidence of dislocation.  Noncontrast CT head showed no evidence of acute intracranial process, including no evidence of intracranial hemorrhage or acute infarct.  CT cervical spine showed no evidence of acute fracture or subluxation injury to the cervical spine. ? ?While in the ED, the following were administered: Lactated Ringer's x1 L bolus followed by continuous LR at 100 cc/h.  Magnesium sulfate 2 g IV over 2 hours x 1 dose  now. ? ?Subsequently, the patient was admitted for overnight observation for further evaluation management presenting syncope, with presentation associated with several laboratory abnormalities, including acute on chronic anemia, acute on chronic hyponatremia, acute kidney injury, hypomagnesemia, hypokalemia, and with presentation also notable for generalized weakness and acute proximal right humerus fracture. ? ? ? ?Review of Systems: As per HPI otherwise 10 point review of systems negative.  ? ?Past Medical History:  ?Diagnosis Date  ? ABDOMINAL AORTIC ANEURYSM   ? Anemia   ? Arthritis   ? HANDS  ? Blood transfusion without reported diagnosis   ? AUGUST 5TH 2017 x2   ? Breast cancer ([place])   ? CAD   ? CONGESTIVE HEART FAILURE, SYSTOLIC, CHRONIC   ? CT, CHEST, ABNORMAL   ? GERD (gastroesophageal reflux disease)   ? Heart attack ([place])   ? x 2  ? History of colon polyps 01/05/2014  ? adenomatous polyps  ? HYPERLIPIDEMIA   ? HYPERTENSION   ? ISCHEMIC CARDIOMYOPATHY   ? Lower GI bleed 01/2014  ? NONSPECIFIC ABN FINDNG RAD&OTH EXAM BILARY TRCT   ? Numbness and tingling   ? 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 Paresthesia of both hands   ? TOBACCO ABUSE   ? ? ?Past Surgical History:  ?Procedure Laterality Date  ? bare metal stent placement    ? left anterior descending artery x 2  ? COLONOSCOPY N/A 01/05/2014  ? Procedure: COLONOSCOPY;  Surgeon:  Jerene Bears, MD;  Location: [facility];  Service: Endoscopy;  Laterality: N/A;  ? COLONOSCOPY N/A 01/06/2014  ? Procedure: COLONOSCOPY;  Surgeon: Jerene Bears, MD;  Location: [facility] END[facility];  Service: Endoscopy;  Laterality: N/A;  ? COLONOSCOPY    ? POLYPECTOMY    ? ? ?Social History: ? reports that she quit smoking about 10 years ago. Her smoking use included cigarettes and e-cigarettes. She has never been exposed to tobacco smoke. She has never used smokeless tobacco. She reports that she does not currently use alcohol. She reports that she does not use drugs. ? ? ?No  Known Allergies ? ?Family History  ?Problem Relation Age of Onset  ? Lymphoma Mother 38  ? Colon polyps Father   ? Depression Father 46  ?     died of suicide  ? Heart attack Father   ? Diabetes Maternal Grandm

## 2021-08-28 NOTE — Assessment & Plan Note (Signed)
?  ?  ?#)   Hyperlipidemia: documented h/o such. On simvastatin as outpatient.    Plan: continue home statin.    

## 2021-08-28 NOTE — Assessment & Plan Note (Signed)
#)   Syncope: 1 episode of syncope that occurred when the patient was moving from a seated to standing position and associated with prodrome, suggestive of potential orthostatic hypotension in the setting of dehydration given report of recently diminished oral intake and exacerbated by home pharmacologic factors serving to diminish compensatory tachycardic/peripheral vasoconstrictive response in the setting of outpatient Coreg and losartan, respectively, with further exacerbation of suspected element of dehydration via home HCTZ.  Additional exacerbation towards this picture of intravascular depletion in the setting of acute on chronic anemia, as further detailed below.  Will check orthostatic vital signs, but with the caveat that the patient has already received IVF's in the ED, potentially altering the results of this evaluation.   ?  ?Not associated with any overt acute focal neurologic deficits, and presenting CT head showed no evidence of acute intracranial process. Clinically, acute ischemic stroke versus seizures appear less likely at this time. Presentation appears less consistent with ACS at this time, with initial troponin found to be nonelevated, presenting EKG showing no evidence of acute ischemic changes, and the absence of any associated CP.  will continue to trend troponin.  ?  ?In setting of associated prodrome, the possibility of ventricular arrhythmia appears to be less likely at this time, although will closely monitor on telemetry for any e/o potential contributory arrhythmia, while also supplementing serum magnesium and potassium levels, as further detailed below.  ?  ?In the context of the patient's history of breast cancer in elevated D-dimer to 15.6, will also check CTA chest to evaluate for any contributory acute pulmonary embolism. ?  ?  ?  ?Plan: I have placed a nursing communication order requesting that orthostatic vital signs x 1 set be checked and documented, following which will  initiate gentle IVF's in the form of lactated Ringer's at 100 cc/h. Monitor on telemetry. Hold home BB and other outpatient antihypertensive medications for now.   Monitor strict I's and O's.  Supplement serum magnesium and potassium levels, as below check CMP, CBC, serum Mg level in the AM. Fall precautions ordered. Trend trop.  CTA chest to evaluate for acute PE. ?  ?  ?

## 2021-08-28 NOTE — Assessment & Plan Note (Signed)
?#)   Acute on chronic hypo-osmolar  hyponatremia: In the setting of a history of chronic hyponatremia, with baseline serum sodium range of [birth year]03/1931, presenting serum sodium noted to be 126.  In terms of contributing factors leading to acute exacerbation, suspect an element of hypovolumeia, with suspected contribution from dehydration in the setting of clinical evidence of such as well as report of recent decline in oral intake coinciding with the timeframe of decline of serum sodium levels, with additional pharmacologic contribution from home HCTZ, particularly given concomitant hypochloremia.  Differential also includes the possibility of a contribution from SIADH, as the patient reports that she may have hit her head as a component of today's syncopal episode. in general, will provide gentle IV fluids to attend to suspected contribution from dehydration, while further evaluating for any additional contributing factors, including SIADH, as further detailed below.  Of note, no evidence of associated acute focal neurologic deficits. ?  ?  ?Plan: monitor strict I's and O's and daily weights.  check random urine sodium, urine osmolality.  Check serum osmolality to confirm suspected hypoosmolar etiology.  Repeat BMP in the morning. Check TSH. Gentle IVF's, as above.  Hold home HCTZ. ?  ?  ?

## 2021-08-28 NOTE — TOC CAGE-AID Note (Signed)
Transition of Care (TOC) - CAGE-AID Screening ? ? ?Patient Details  ?Name: [name] ?MRN: [id number] ?Date of Birth: Sep 05, 1950 ? ?Transition of Care (TOC) CM/SW Contact:    ?Tayvion Lauder C Tarpley-Carter, LCSWA ?Phone Number: ?08/28/2021, 9:56 AM ? ? ?Clinical Narrative: ?Pt participated in [place].  Pt stated she does not use substance or ETOH.  Pt was not offered resources, due to no usage of substance or ETOH.   ? ?Passenger transport manager, MSW, LCSW-A ?Pronouns:  She/Her/Hers ?[facility] of [facility] ?Clinical Social Worker ?Direct Number:  [phone] ?[email or web address] ? ?CAGE-AID Screening: ?  ? ?Have You Ever Felt You Ought to Cut Down on Your Drinking or Drug Use?: No ?Have People Annoyed You By Critizing Your Drinking Or Drug Use?: No ?Have You Felt Bad Or Guilty About Your Drinking Or Drug Use?: No ?Have You Ever Had a Drink or Used Drugs First Thing In The Morning to Steady Your Nerves or to Get Rid of a Hangover?: No ?CAGE-AID Score: 0 ? ?Substance Abuse Education Offered: No ? ?  ? ? ? ? ? ? ?

## 2021-08-28 NOTE — Assessment & Plan Note (Signed)
?  ?#)   Generalized weakness:  4-5 day duration of generalized weakness, in the absence of any evidence of acute focal neurologic deficits, including no evidence of acute focal weakness, with presenting CT head showed no evidence of acute intracranial process.  At this time, she lives by herself, as her husband is undergoing temporary rehab in SNF. no e/o additional infectious process at this time, including no evidence of urinary tract infection, while chest x-ray shows no evidence of infiltrate to suggest pneumonia..  ?  ?Suspect contribution from dehydration, particularly given recent decline in oral intake, with resultant hyponatremia and multiple additional electrolyte abnormalities, as further detailed above, as well as resultant acute kidney injury, resulting in relative decline in renal clearance of gabapentin. ?  ?  ?Plan: work-up and management of presenting dehydration and acute kidney injury, as described above, including gentle continuous IV fluids overnight. PT consult ordered for the AM. Fall precautions. CMP/CBC in the AM. Check TSH, MMA. Check CPK level. ?  ?  ?  ?  ?

## 2021-08-28 NOTE — Assessment & Plan Note (Signed)
?  ?#)   GERD: documented h/o such; on Protonix as outpatient.  In the setting of presenting hypokalemia, will hold home Protonix for now. ?  ?Plan: Hold home PPI for now.  ?  ?  ?  ?

## 2021-08-29 DIAGNOSIS — R55 Syncope and collapse: Secondary | ICD-10-CM | POA: Diagnosis not present

## 2021-08-29 LAB — CBC WITH DIFFERENTIAL/PLATELET
Abs Immature Granulocytes: 0.01 10*3/uL (ref 0.00–0.07)
Basophils Absolute: 0 10*3/uL (ref 0.0–0.1)
Basophils Relative: 0 %
Eosinophils Absolute: 0.1 10*3/uL (ref 0.0–0.5)
Eosinophils Relative: 1 %
HCT: 22 % — ABNORMAL LOW (ref 36.0–46.0)
Hemoglobin: 7.6 g/dL — ABNORMAL LOW (ref 12.0–15.0)
Immature Granulocytes: 0 %
Lymphocytes Relative: 9 %
Lymphs Abs: 0.4 10*3/uL — ABNORMAL LOW (ref 0.7–4.0)
MCH: 32.6 pg (ref 26.0–34.0)
MCHC: 34.5 g/dL (ref 30.0–36.0)
MCV: 94.4 fL (ref 80.0–100.0)
Monocytes Absolute: 0.5 10*3/uL (ref 0.1–1.0)
Monocytes Relative: 11 %
Neutro Abs: 3.9 10*3/uL (ref 1.7–7.7)
Neutrophils Relative %: 79 %
Platelets: 104 10*3/uL — ABNORMAL LOW (ref 150–400)
RBC: 2.33 MIL/uL — ABNORMAL LOW (ref 3.87–5.11)
RDW: 14.5 % (ref 11.5–15.5)
WBC: 4.9 10*3/uL (ref 4.0–10.5)
nRBC: 0 % (ref 0.0–0.2)

## 2021-08-29 LAB — BASIC METABOLIC PANEL
Anion gap: 7 (ref 5–15)
BUN: 7 mg/dL — ABNORMAL LOW (ref 8–23)
CO2: 27 mmol/L (ref 22–32)
Calcium: 8.4 mg/dL — ABNORMAL LOW (ref 8.9–10.3)
Chloride: 95 mmol/L — ABNORMAL LOW (ref 98–111)
Creatinine, Ser: 0.62 mg/dL (ref 0.44–1.00)
GFR, Estimated: >60 mL/min (ref >60)
Glucose, Bld: 100 mg/dL — ABNORMAL HIGH (ref 70–99)
Potassium: 3.7 mmol/L (ref 3.5–5.1)
Sodium: 129 mmol/L — ABNORMAL LOW (ref 135–145)

## 2021-08-29 NOTE — Progress Notes (Signed)
?Progress note ? ? ?[name] XEN:[id number] DOB: 09-Jun-1950 DOA: 08/27/2021 ? ?PCP: Reynold Bowen, MD  ?Patient coming from: home  ? ? ?Chief Complaint: Episode of loss of consciousness ? ?HPI: [name] is a 71 y.o. female with medical history significant for breast cancer status postchemotherapy, chronic iron deficiency anemia associated baseline hemoglobin 10-31, chronic diastolic heart failure, hypertension, hyperlipidemia, chronic hyponatremia with baseline serum sodium 130-134, who is admitted to [facility] on 08/27/2021 for further evaluation management of syncope after presenting from home to [facility] ED complaining of episode of loss of consciousness.  ? ?The patient reports that she has been experiencing intermittent dizziness over the last 3 days, when she moves from a seated to a standing position.  She subsequently experienced a single episode of loss of consciousness earlier today when rising from a seated to a standing position, this was associated with preceding dizziness, lightheadedness, and subjective sensation of impending loss of consciousness.  She reports that this episode of syncope resulted in a ground-level fall to the floor, in which she struck her right side, and believes that she likely also struck her head.  This episode was unwitnessed, and while the patient is not entirely sure as to the exact duration during which she was unconscious, she believes that it was only for a few seconds.  Not associate with any chest pain, shortness of breath, palpitations, nausea, vomiting.  No associated tongue biting or loss of bowel/bladder function.  Denies any ensuing episodes of loss of consciousness.  Is on daily Plavix, but otherwise on no additional blood thinners. ? ?Denies any associated or ensuing headache. no associated acute focal weakness, acute focal numbness, paresthesias, facial droop, slurred speech, expressive aphasia, acute change in vision, dysphagia, vertigo. ? ?Denies  any recent worsening of edema in the lower extremities, nor any calf tenderness or new lower extremity edema.  Denies any recent preceding trauma, but does acknowledge a history of breast cancer, for which she completed her last session of chemotherapy approximately 2 months ago.  Denies any personal history of DVT/PE.  No recent travel.  ? ?Denies any recent subjective fever, chills, rigors, or generalized myalgias.  No recent cough, rash, neck stiffness, abdominal pain, or diarrhea.  No recent dysuria or gross hematuria.  No recent melena or hematochezia. ? ?Stemming from her loss of consciousness earlier today, she reports near constant right shoulder discomfort, worse with attempting to move the right upper extremity.  Denies any associated numbness or paresthesias. ? ?Over the last week leading up to onset of intermittent dizziness, the patient reports a decline in oral intake of both food and water, as a consequence of associated decrease in appetite.  She lives at home, as her husband is currently residing in SNF for rehab.  ? ?She has a history of coronary artery disease status post bare-metal stents.  She also has a documented history of chronic diastolic heart failure, with most recent echo per chart review occurring in March 2015, at which time LVEF was 55 to 60%, no focal motion antibiotics, grade 1 diastolic dysfunction, mild mitral vegetation and mild tricuspid regurgitation.  She is on HCTZ, but otherwise not on a diuretic medications.  ? ?Medical history also notable for essential pretension, for which she is also on Coreg as well as losartan. ? ?Per chart review, between December 2022 and 08/03/2021, serum sodium is ranged between 1 31-1 34.  Additionally, per chart review, from December 2022 through 08/03/2021, hemoglobin has been in  the range of 10.1-11.5. ? ? ? ?ED Course:  ?Vital signs in the ED were notable for the following: Afebrile; heart rate 67-76; initial blood pressure 86/54, sodium  improving to 109/60 following interval administration of IV fluids, as further detailed below; respiratory rate 18 is 24, oxygen saturation 97 to 100% on room air. ? ?Labs were notable for the following: CMP notable for the following: Sodium 126 compared to most recent prior value 134 on 08/03/2021, potassium 3.4, chloride 90, creatinine 1.17 relative to most recent prior serum creatinine did 1-0.68 on 08/03/2021, glucose 130, liver enzymes within normal limits.  Serum magnesium level 1.4.  Urinalysis showed no white blood cells no red blood cells, no protein, but was positive for hyaline cast.  Initial high-sensitivity troponin I 8.  CBC notable for white cell count 8000, hemoglobin 9.1 associated normocytic/normochromic findings as well as nonelevated RDW, compared to most recent prior hemoglobin of 11.5 on 08/03/2021, platelet count 136.  D-dimer 15.63.  INR 1.0. ? ?Imaging and additional notable ED work-up: EKG shows sinus rhythm with nonspecific intraventricular conduction delay, heart rate 75, no evidence of T wave or ST changes, clear no evidence of ST elevation.  Chest x-ray showed no evidence of acute cardiopulmonary process.  Plain films of the right hand and right elbow showed no evidence of acute fracture or dislocation.  Plain films of the right shoulder showed proximal right humeral fracture without evidence of dislocation.  Noncontrast CT head showed no evidence of acute intracranial process, including no evidence of intracranial hemorrhage or acute infarct.  CT cervical spine showed no evidence of acute fracture or subluxation injury to the cervical spine. ? ?While in the ED, the following were administered: Lactated Ringer's x1 L bolus followed by continuous LR at 100 cc/h.  Magnesium sulfate 2 g IV over 2 hours x 1 dose now. ? ?Subsequently, the patient was admitted for overnight observation for further evaluation management presenting syncope, with presentation associated with several laboratory  abnormalities, including acute on chronic anemia, acute on chronic hyponatremia, acute kidney injury, hypomagnesemia, hypokalemia, and with presentation also notable for generalized weakness and acute proximal right humerus fracture. ? ? ? ? ?Objective  ? ? ?Physical Exam: ?Vitals:  ? 08/29/21 0032 08/29/21 0400 08/29/21 0756 08/29/21 1222  ?BP: (!) 145/67 (!) 174/77 (!) 155/74 (!) 169/80  ?Pulse: 75 75 80 75  ?Resp: '16 16 16 16  '$ ?Temp: 98.9 ?F (37.2 ?C) 98.3 ?F (36.8 ?C) 98.3 ?F (36.8 ?C) 98.2 ?F (36.8 ?C)  ?TempSrc: Oral Oral Oral Oral  ?SpO2: 99% 100% 98% 95%  ?Weight:  46.6 kg    ?Height:      ? ? ?General: appears to be stated age; alert, oriented ?Skin: warm, dry, no rash ?Head:  AT/[place] ?Mouth:  Oral mucosa membranes appear dry, normal dentition ?Neck: supple; trachea midline ?Heart:  RRR; did not appreciate any M/R/G ?Lungs: CTAB, did not appreciate any wheezes, rales, or rhonchi ?Abdomen: + BS; soft, ND, NT ?Vascular: 2+ pedal pulses b/l; 2+ radial pulses b/l ?Extremities: no peripheral edema, no muscle wasting ?Neuro: Deferred strength assessment of the right upper extremity in the setting of acute proximal right humerus fracture; 5 out of 5 strength in right lower extremity and 5 out of 5 strength in upper and lower extremity on left; sensation intact in upper and lower extremities bilaterally. ? ? ? ?Labs on Admission: I have personally reviewed following labs and imaging studies ? ?CBC: ?Recent Labs  ?Lab 08/27/21 ?1830 08/28/21 ?8088  08/29/21 ?0844  ?WBC 8.0 5.4 4.9  ?NEUTROABS 6.4 4.0 3.9  ?HGB 9.1* 8.1* 7.6*  ?HCT 26.3* 23.6* 22.0*  ?MCV 95.3 94.8 94.4  ?PLT 136* 108* 104*  ? ?Basic Metabolic Panel: ?Recent Labs  ?Lab 08/27/21 ?1830 08/28/21 ?0419 08/29/21 ?9810  ?NA 126* 128* 129*  ?K 3.4* 3.0* 3.7  ?CL 90* 93* 95*  ?CO2 '26 26 27  '$ ?GLUCOSE 130* 109* 100*  ?BUN 19 13 7*  ?CREATININE 1.17* 0.76 0.62  ?CALCIUM 8.6* 8.6* 8.4*  ?MG 1.4* 1.9  --   ? ?GFR: ?Estimated Creatinine Clearance: 48.8 mL/min (by C-G  formula based on SCr of 0.62 mg/dL). ?Liver Function Tests: ?Recent Labs  ?Lab 08/27/21 ?1830 08/28/21 ?0419  ?AST 29 21  ?ALT 15 10  ?ALKPHOS 44 46  ?BILITOT 0.9 1.1  ?PROT 5.7* 5.4*  ?ALBUMIN 3.5 3.2*  ? ?No resu

## 2021-08-29 NOTE — TOC Initial Note (Signed)
Transition of Care (TOC) - Initial/Assessment Note  ? ? ?Patient Details  ?Name: [name] ?MRN: [id number] ?Date of Birth: Dec 22, 1950 ? ?Transition of Care (TOC) CM/SW Contact:    ?Benard Halsted, LCSW ?Phone Number: ?08/29/2021, 2:43 PM ? ?Clinical Narrative:                 ?CSW received consult for possible SNF placement at time of discharge. CSW spoke with patient. Patient reported that patient's spouse is currently at [facility] for rehab. Patient expressed understanding of PT recommendation and is agreeable to SNF placement at time of discharge. Patient reports preference for [facility] and stated they are expecting her. CSW discussed insurance authorization process and will provide Medicare SNF ratings list. Patient has received COVID vaccines. She stated she can have a friend Designer, fashion/clothing) transport her to SNF.  ? ?CSW spoke with [facility] and confirmed they can accept patient pending insurance approval. CSW received call back from [facility] and started SNF process.  ? ?Skilled Nursing Rehab Facilities-   [email or web address]   Ratings out of 5 possible   ?Name Address  Phone # Quality Care Staffing Health Inspection Overall  ?[facility] [address], [place] '5 5 2 4  '$ ?[facility]  [address], [place] [phone] '4 2 5 5  '$ ?[facility] [place], [place] '4 1 1 1  '$ ?[place] [address], [place] '2 2 4 4  '$ ?[facility] [place] [address], [place] '1 1 2 1  '$ ?[place] '2 1 4 3  '$ ?[place] '5 2 2 3  '$ ?[facility] [address], [place] '5 2 2 3  '$ ?[address] ([facility]) [address], [place] [phone] '5 1 2 2  '$ ?[facility] [address], [place] [phone] '4 1 1 1  '$ ?[facility] [address] [place] [phone] '4 1 2 1  '$ ?[facility] [place] '4 1 1 1  '$ ?        ?[place], [place]      ?[facility] [place] '4 2 3 3  '$ ?[facility] [place], [place] '3 1 5 4  '$ ?[place] [address] [place] [address], [place] [phone] '2 1 1 1  '$ ?[facility] [address] [place] [phone] '2 2 3 3  '$ ?        ?[address] (no [facility]) [address], [place] [phone] '4 5 5 5  '$ ?[facility] (No Humana) [address], [place] '4 1 4 3  '$ ?[facility] (No UHC) [address] [place] [phone] '5 5 5 5  '$ ?[facility] [address], [place] [phone] '3 2 4 4  '$ ?[facility] [address] [place] [phone] '3 3 4 4  '$ ?[place] [address] [place] '1 1 2 1  '$ ?[facility] [address], [place] [phone] '2 1 1 1  '$ ?[facility] [place] '5 2 4 5  '$ ?[facility] [address] [place] '3 1 1 1  '$ ?[facility] [place], [place] '2 1 2 1  '$ ?        ?[facility] [place] '1 1 1 1  '$ ?[facility] [facility] [address],  [place]  [phone] '2 4 2 2  '$ ?[facility]  [address] [address], [place] [phone] '5 2 3 4  '$ ?[place] [address] [place] '2 1 1 1  '$ ?[place] (No Humana) [address], [place] '2 1 3 2  '$ ?[facility] [address], [place] [phone] '3 1 1 1  '$ ?        ?[facility] [place], [place] '5 4 5 5  '$ ?[facility])  [address], [place] '2 2 3 3  '$ ?[facility]) [place], [place] '3 2 4 4  '$ ?[facility] [address], [place] '4 3 4 4  '$ ?[address] [place], [place] '3 3 1 1  '$ ?[facility]) [address] [place] [phone] '2 2 4 4  '$ ? ? ? ?Expected Discharge Plan: [place] ?Barriers to Discharge: Insurance Authorization ? ? ?Patient Goals and CMS Choice ?Patient states their goals for this hospitalization and ongoing recovery are:: Rehab ?CMS Medicare.gov Compare Post Acute Care list provided to:: Patient ?Choice offered to / list presented to : Patient ? ?Expected Discharge Plan and Services ?Expected Discharge Plan: [place] ?In-house Referral: Clinical Social Work ?  ?Post Acute Care Choice: [place] ?Living arrangements for the past 2 months: [place] ?                ?  ?  ?  ?  ?  ?  ?  ?  ?  ?  ? ?Prior Living Arrangements/Services ?Living arrangements for the past 2 months: [place] ?Lives with:: Spouse ?Patient language and need for interpreter reviewed:: Yes ?Do you feel safe going back to the place where you live?: Yes      ?Need for Family Participation in Patient Care: No (Comment) ?Care giver support system in place?: No (comment) ?  ?Criminal Activity/Legal Involvement Pertinent to Current Situation/Hospitalization: No - Comment as needed ? ?Activities of Daily Living ?Home Assistive Devices/Equipment: None ?ADL Screening (condition at time of admission) ?Patient's cognitive ability adequate to safely complete daily activities?: Yes ?Is the patient deaf or have difficulty hearing?: No ?Does the patient have difficulty seeing, even when wearing glasses/contacts?: No ?Does the patient have difficulty concentrating, remembering, or making decisions?: No ?Patient able to express need for assistance with ADLs?: Yes ?Does the patient have difficulty dressing or bathing?: No ?Independently performs ADLs?: Yes (appropriate for developmental age) ?Does the patient have difficulty walking or  climbing stairs?: No ?Weakness of Legs: None ?Weakness of Arms/Hands: None ? ?Permission Sought/Granted ?Permission sought to share information with : Facility Sport and exercise psychologist, Family Supports ?Permission granted to share information with : Yes, Verbal Permission Granted ? Share Information with NAME: Clair Gulling ? Permission granted to share info w AGENCY: SNFs ? Permission granted to share info w Relationship: Spouse ? Permission granted to share info w Contact Information: [phone] ? ?Emotional Assessment ?Appearance:: Appears stated age ?Attitude/Demeanor/Rapport: Engaged ?Affect (typically observed): Accepting, Appropriate ?Orientation: : Oriented to Self, Oriented to Place, Oriented to  Time, Oriented to Situation ?Alcohol / Substance Use: Not Applicable ?Psych Involvement: No (comment) ? ?Admission diagnosis:  Orthostatic hypotension [I95.1] ?Syncope and collapse [R55] ?Dehydration [E86.0] ?Hypomagnesemia [E83.42] ?Dizziness [R42] ?Hyponatremia [E87.1] ?Syncope [R55] ?Patient Active Problem List  ? Diagnosis Date Noted  ? AKI (acute kidney injury) ([place]) 08/28/2021  ? Hyponatremia 08/28/2021  ?  Hypomagnesemia 08/28/2021  ? Hypokalemia 08/28/2021  ? Closed fracture of proximal end of right humerus 08/28/2021  ? Generalized weakness 08/28/2021  ? Hypotension 08/28/2021  ? Chronic diastolic CHF (congestive heart failure) ([place]) 08/28/2021  ? GERD (gastroesophageal reflux disease) 08/28/2021  ? Syncope 08/27/2021  ? Peripheral arterial disease ([place]) 04/26/2019  ? Stenosis of left subclavian artery ([place]) 03/12/2019  ? Paresthesia 07/13/2018  ? Gait abnormality 07/13/2018  ? Acute on chronic anemia 01/16/2016  ? Left carotid bruit 01/16/2016  ? History of colonic polyps 04/20/2014  ? Chronic anticoagulation 04/20/2014  ? Multiple adenomatous polyps 01/24/2014  ? Lower GI bleed 01/05/2014  ? Post-polypectomy bleeding 01/05/2014  ? NONSPECIFIC ABN FINDNG RAD&OTH EXAM BILARY TRCT 11/09/2008  ? Nonspecific (abnormal)  findings on radiological and other examination of body structure 11/01/2008  ? CT, CHEST, ABNORMAL 11/01/2008  ? ABDOMINAL AORTIC ANEURYSM 10/26/2008  ? HLD (hyperlipidemia) 10/25/2008  ? TOBACCO ABUSE 10/25/2008  ? Ess

## 2021-08-29 NOTE — NC FL2 (Signed)
?[place] MEDICAID FL2 LEVEL OF CARE SCREENING TOOL  ?  ? ?IDENTIFICATION  ?Patient Name: ?[name] Birthdate: [birth year] Sex: female Admission Date (Current Location): ?08/27/2021  ?[place] and [place] Number: ? [place] ?  Facility and Address:  ?The . [facility] [place] [address], [place], [place] [address] ?     Provider Number: ?[id number]  ?Attending Physician Name and Address:  ?Phillips Grout, MD ? Relative Name and Phone Number:  ?  ?   ?Current Level of Care: ?Hospital Recommended Level of Care: ?[place] Prior Approval Number: ?  ? ?Date Approved/Denied: ?  PASRR Number: ?[id number] A ? ?Discharge Plan: ?SNF ?  ? ?Current Diagnoses: ?Patient Active Problem List  ? Diagnosis Date Noted  ? AKI (acute kidney injury) ([place]) 08/28/2021  ? Hyponatremia 08/28/2021  ? Hypomagnesemia 08/28/2021  ? Hypokalemia 08/28/2021  ? Closed fracture of proximal end of right humerus 08/28/2021  ? Generalized weakness 08/28/2021  ? Hypotension 08/28/2021  ? Chronic diastolic CHF (congestive heart failure) ([place]) 08/28/2021  ? GERD (gastroesophageal reflux disease) 08/28/2021  ? Syncope 08/27/2021  ? Peripheral arterial disease ([place]) 04/26/2019  ? Stenosis of left subclavian artery ([place]) 03/12/2019  ? Paresthesia 07/13/2018  ? Gait abnormality 07/13/2018  ? Acute on chronic anemia 01/16/2016  ? Left carotid bruit 01/16/2016  ? History of colonic polyps 04/20/2014  ? Chronic anticoagulation 04/20/2014  ? Multiple adenomatous polyps 01/24/2014  ? Lower GI bleed 01/05/2014  ? Post-polypectomy bleeding 01/05/2014  ? NONSPECIFIC ABN FINDNG RAD&OTH EXAM BILARY TRCT 11/09/2008  ? Nonspecific (abnormal) findings on radiological and other examination of body structure 11/01/2008  ? CT, CHEST, ABNORMAL 11/01/2008  ? ABDOMINAL AORTIC ANEURYSM 10/26/2008  ? HLD (hyperlipidemia) 10/25/2008  ? TOBACCO ABUSE 10/25/2008  ? Essential hypertension 10/25/2008  ? Coronary atherosclerosis 10/25/2008  ? ISCHEMIC  CARDIOMYOPATHY 10/25/2008  ? Congestive heart failure ([place]) 10/25/2008  ? ? ?Orientation RESPIRATION BLADDER Height & Weight   ?  ?Self, Time, Situation, Place ? Normal Continent Weight: 102 lb 11.8 oz (46.6 kg) ?Height:  '5\' 3"'$  (160 cm)  ?BEHAVIORAL SYMPTOMS/MOOD NEUROLOGICAL BOWEL NUTRITION STATUS  ?    Continent Diet (See dc summary)  ?AMBULATORY STATUS COMMUNICATION OF NEEDS Skin   ?Limited Assist Verbally Normal ?  ?  ?  ?    ?     ?     ? ? ?Personal Care Assistance Level of Assistance  ?Bathing, Feeding, Dressing Bathing Assistance: Limited assistance ?Feeding assistance: Independent ?Dressing Assistance: Limited assistance ?   ? ?Functional Limitations Info  ?    ?  ?   ? ? ?SPECIAL CARE FACTORS FREQUENCY  ?PT (By licensed PT), OT (By licensed OT)   ?  ?PT Frequency: 5x/week ?OT Frequency: 5x/week ?  ?  ?  ?   ? ? ?Contractures Contractures Info: Not present  ? ? ?Additional Factors Info  ?Code Status, Allergies Code Status Info: Full ?Allergies Info: NKA ?  ?  ?  ?   ? ?Current Medications (08/29/2021):  This is the current hospital active medication list ?Current Facility-Administered Medications  ?Medication Dose Route Frequency Provider Last Rate Last Admin  ? acetaminophen (TYLENOL) tablet 650 mg  650 mg Oral Q6H PRN Howerter, Justin B, DO   650 mg at 08/28/21 1727  ? Or  ? acetaminophen (TYLENOL) suppository 650 mg  650 mg Rectal Q6H PRN Howerter, Justin B, DO      ? clopidogrel (PLAVIX) tablet 75 mg  75 mg  Oral Daily Howerter, Justin B, DO   75 mg at 08/29/21 1753  ? iron polysaccharides (NIFEREX) capsule 150 mg  150 mg Oral Daily Howerter, Justin B, DO   150 mg at 08/29/21 0104  ? lactated ringers infusion   Intravenous Continuous Godfrey Pick, MD 100 mL/hr at 08/29/21 0501 New Bag at 08/29/21 0501  ? ondansetron (ZOFRAN) injection 4 mg  4 mg Intravenous Q6H PRN Howerter, Justin B, DO      ? simvastatin (ZOCOR) tablet 40 mg  40 mg Oral q1800 Howerter, Justin B, DO   40 mg at 08/28/21 1728   ? ? ? ?Discharge Medications: ?Please see discharge summary for a list of discharge medications. ? ?Relevant Imaging Results: ? ?Relevant Lab Results: ? ? ?Additional Information ?SSN: [id number]. Pfizer vaccines 07/09/19, 07/30/19, 02/28/20, 09/05/20, 02/17/21 ? ?Lissa Morales Lan [place], LCSW ? ? ? ? ?

## 2021-08-30 DIAGNOSIS — R278 Other lack of coordination: Secondary | ICD-10-CM | POA: Diagnosis not present

## 2021-08-30 DIAGNOSIS — I10 Essential (primary) hypertension: Secondary | ICD-10-CM | POA: Diagnosis not present

## 2021-08-30 DIAGNOSIS — E871 Hypo-osmolality and hyponatremia: Secondary | ICD-10-CM | POA: Diagnosis not present

## 2021-08-30 DIAGNOSIS — R296 Repeated falls: Secondary | ICD-10-CM | POA: Diagnosis not present

## 2021-08-30 DIAGNOSIS — N179 Acute kidney failure, unspecified: Secondary | ICD-10-CM | POA: Diagnosis not present

## 2021-08-30 DIAGNOSIS — K219 Gastro-esophageal reflux disease without esophagitis: Secondary | ICD-10-CM | POA: Diagnosis not present

## 2021-08-30 DIAGNOSIS — S42301D Unspecified fracture of shaft of humerus, right arm, subsequent encounter for fracture with routine healing: Secondary | ICD-10-CM | POA: Diagnosis not present

## 2021-08-30 DIAGNOSIS — Z741 Need for assistance with personal care: Secondary | ICD-10-CM | POA: Diagnosis not present

## 2021-08-30 DIAGNOSIS — R269 Unspecified abnormalities of gait and mobility: Secondary | ICD-10-CM | POA: Diagnosis not present

## 2021-08-30 DIAGNOSIS — E119 Type 2 diabetes mellitus without complications: Secondary | ICD-10-CM | POA: Diagnosis not present

## 2021-08-30 DIAGNOSIS — I5032 Chronic diastolic (congestive) heart failure: Secondary | ICD-10-CM | POA: Diagnosis not present

## 2021-08-30 DIAGNOSIS — E559 Vitamin D deficiency, unspecified: Secondary | ICD-10-CM | POA: Diagnosis not present

## 2021-08-30 DIAGNOSIS — E46 Unspecified protein-calorie malnutrition: Secondary | ICD-10-CM | POA: Diagnosis not present

## 2021-08-30 DIAGNOSIS — M6281 Muscle weakness (generalized): Secondary | ICD-10-CM | POA: Diagnosis not present

## 2021-08-30 DIAGNOSIS — R2681 Unsteadiness on feet: Secondary | ICD-10-CM | POA: Diagnosis not present

## 2021-08-30 DIAGNOSIS — R293 Abnormal posture: Secondary | ICD-10-CM | POA: Diagnosis not present

## 2021-08-30 DIAGNOSIS — I2510 Atherosclerotic heart disease of native coronary artery without angina pectoris: Secondary | ICD-10-CM | POA: Diagnosis not present

## 2021-08-30 DIAGNOSIS — E785 Hyperlipidemia, unspecified: Secondary | ICD-10-CM | POA: Diagnosis not present

## 2021-08-30 DIAGNOSIS — W19XXXA Unspecified fall, initial encounter: Secondary | ICD-10-CM | POA: Diagnosis not present

## 2021-08-30 DIAGNOSIS — Z23 Encounter for immunization: Secondary | ICD-10-CM | POA: Diagnosis not present

## 2021-08-30 DIAGNOSIS — J432 Centrilobular emphysema: Secondary | ICD-10-CM | POA: Diagnosis not present

## 2021-08-30 DIAGNOSIS — E782 Mixed hyperlipidemia: Secondary | ICD-10-CM | POA: Diagnosis not present

## 2021-08-30 DIAGNOSIS — D649 Anemia, unspecified: Secondary | ICD-10-CM | POA: Diagnosis not present

## 2021-08-30 DIAGNOSIS — E876 Hypokalemia: Secondary | ICD-10-CM | POA: Diagnosis not present

## 2021-08-30 DIAGNOSIS — D62 Acute posthemorrhagic anemia: Secondary | ICD-10-CM | POA: Diagnosis not present

## 2021-08-30 DIAGNOSIS — I959 Hypotension, unspecified: Secondary | ICD-10-CM | POA: Diagnosis not present

## 2021-08-30 DIAGNOSIS — R2689 Other abnormalities of gait and mobility: Secondary | ICD-10-CM | POA: Diagnosis not present

## 2021-08-30 DIAGNOSIS — S42309A Unspecified fracture of shaft of humerus, unspecified arm, initial encounter for closed fracture: Secondary | ICD-10-CM | POA: Diagnosis not present

## 2021-08-30 DIAGNOSIS — R55 Syncope and collapse: Secondary | ICD-10-CM | POA: Diagnosis not present

## 2021-08-30 LAB — CBC WITH DIFFERENTIAL/PLATELET
Abs Immature Granulocytes: 0.01 10*3/uL (ref 0.00–0.07)
Basophils Absolute: 0 10*3/uL (ref 0.0–0.1)
Basophils Relative: 1 %
Eosinophils Absolute: 0.1 10*3/uL (ref 0.0–0.5)
Eosinophils Relative: 1 %
HCT: 21.1 % — ABNORMAL LOW (ref 36.0–46.0)
Hemoglobin: 7.2 g/dL — ABNORMAL LOW (ref 12.0–15.0)
Immature Granulocytes: 0 %
Lymphocytes Relative: 9 %
Lymphs Abs: 0.5 10*3/uL — ABNORMAL LOW (ref 0.7–4.0)
MCH: 32.4 pg (ref 26.0–34.0)
MCHC: 34.1 g/dL (ref 30.0–36.0)
MCV: 95 fL (ref 80.0–100.0)
Monocytes Absolute: 0.7 10*3/uL (ref 0.1–1.0)
Monocytes Relative: 12 %
Neutro Abs: 4.5 10*3/uL (ref 1.7–7.7)
Neutrophils Relative %: 77 %
Platelets: 115 10*3/uL — ABNORMAL LOW (ref 150–400)
RBC: 2.22 MIL/uL — ABNORMAL LOW (ref 3.87–5.11)
RDW: 14.4 % (ref 11.5–15.5)
WBC: 5.8 10*3/uL (ref 4.0–10.5)
nRBC: 0 % (ref 0.0–0.2)

## 2021-08-30 LAB — BASIC METABOLIC PANEL
Anion gap: 9 (ref 5–15)
BUN: 5 mg/dL — ABNORMAL LOW (ref 8–23)
CO2: 26 mmol/L (ref 22–32)
Calcium: 8.5 mg/dL — ABNORMAL LOW (ref 8.9–10.3)
Chloride: 94 mmol/L — ABNORMAL LOW (ref 98–111)
Creatinine, Ser: 0.68 mg/dL (ref 0.44–1.00)
GFR, Estimated: >60 mL/min (ref >60)
Glucose, Bld: 97 mg/dL (ref 70–99)
Potassium: 3.4 mmol/L — ABNORMAL LOW (ref 3.5–5.1)
Sodium: 129 mmol/L — ABNORMAL LOW (ref 135–145)

## 2021-08-30 LAB — METHYLMALONIC ACID, SERUM: Methylmalonic Acid, Quantitative: 156 nmol/L (ref 0–378)

## 2021-08-30 MED ORDER — ACETAMINOPHEN 325 MG PO TABS: 650.0000 mg | ORAL_TABLET | Freq: Four times a day (QID) | ORAL | Status: DC | PRN

## 2021-08-30 MED ORDER — POTASSIUM CHLORIDE CRYS ER 20 MEQ PO TBCR
40.0000 meq | EXTENDED_RELEASE_TABLET | ORAL | Status: DC
  Administered 2021-08-30: 40 meq via ORAL
  Filled 2021-08-30: qty 2

## 2021-08-30 NOTE — Progress Notes (Addendum)
Physical Therapy Treatment ?Patient Details ?Name: [name] ?MRN: [id number] ?DOB: 03-21-1951 ?Today's Date: 08/30/2021 ? ? ?History of Present Illness Pt is a 71 yo Female presenting to the ED 08/27/21 after a loss of consciousness, falls x6 in 24 hours, and persistent dizziness/lightheadedness x3 days. Imaigng (+) for mild displaced fx of the R proximal humerus. PMH includes breast CA, HTN, CAD, CHF, AAA, PAD. ? ?  ?PT Comments  ? ? Patient progressing with ambulation distance, though fatigued and HR elevation to 140's during ambulation.  Orthostatic again, though BP back up with standing 3 minutes.  Patient still denies symptoms except fatigue.  Needing cues for not using R UE.  Patient remains appropriate for SNF level rehab at d/c.  PT will continue to follow.   ?Orthostatic VS for the past 24 hrs (Last 3 readings): ? BP- Lying BP- Sitting BP- Standing at 0 minutes BP- Standing at 3 minutes  ?08/30/21 1317 153/80 130/77 107/71 119/71  ?   ?Recommendations for follow up therapy are one component of a multi-disciplinary discharge planning process, led by the attending physician.  Recommendations may be updated based on patient status, additional functional criteria and insurance authorization. ? ?Follow Up Recommendations ? Skilled nursing-short term rehab (<3 hours/day) ?  ?  ?Assistance Recommended at Discharge Frequent or constant Supervision/Assistance  ?Patient can return home with the following A little help with walking and/or transfers;A little help with bathing/dressing/bathroom;Assistance with cooking/housework;Assist for transportation;Help with stairs or ramp for entrance ?  ?Equipment Recommendations ? Rolling walker (2 wheels)  ?  ?Recommendations for Other Services   ? ? ?  ?Precautions / Restrictions Precautions ?Precautions: Fall ?Precaution Comments: orthstatic hypotension ?Required Braces or Orthoses: Sling ?Restrictions ?Weight Bearing Restrictions: Yes ?RUE Weight Bearing: Non weight  bearing  ?  ? ?Mobility ? Bed Mobility ?Overal bed mobility: Needs Assistance ?  ?  ?  ?Supine to sit: Min guard, HOB elevated ?Sit to supine: Supervision ?  ?General bed mobility comments: assist for balance and cues to not pull up with R UE ?  ? ?Transfers ?Overall transfer level: Needs assistance ?Equipment used: None ?  ?Sit to Stand: Min guard ?  ?  ?  ?  ?  ?General transfer comment: mild imbalance upon standing minguard for safety, BP measured throughout ?  ? ?Ambulation/Gait ?Ambulation/Gait assistance: Min assist ?Gait Distance (Feet): 100 Feet (x 2) ?Assistive device: IV Pole ?Gait Pattern/deviations: Step-through pattern, Decreased stride length ?  ?  ?  ?General Gait Details: HR up to 140's with ambulation, rest for return to 120's, 107 at rest; minA for balance and IV pole management ? ? ?Stairs ?  ?  ?  ?  ?  ? ? ?Wheelchair Mobility ?  ? ?Modified Rankin (Stroke Patients Only) ?  ? ? ?  ?Balance Overall balance assessment: Needs assistance ?Sitting-balance support: Feet supported ?Sitting balance-Leahy Scale: Good ?  ?  ?Standing balance support: Single extremity supported ?Standing balance-Leahy Scale: Poor ?Standing balance comment: seeking UE support in standing and off balance till she finds something (IV pole) to hold ?  ?  ?  ?  ?  ?  ?  ?  ?  ?  ?  ?  ? ?  ?Cognition Arousal/Alertness: Awake/alert ?Behavior During Therapy: Flat affect ?Overall Cognitive Status: Impaired/Different from baseline ?Area of Impairment: Problem solving, Safety/judgement ?  ?  ?  ?  ?  ?  ?  ?  ?  ?  ?  ?  ?  Safety/Judgement: Decreased awareness of safety ?  ?Problem Solving: Slow processing, Requires verbal cues ?General Comments: cues not to pull with R UE ?  ?  ? ?  ?Exercises   ? ?  ?General Comments General comments (skin integrity, edema, etc.): donned sling in sitting with total A and left on after session ?  ?  ? ?Pertinent Vitals/Pain Pain Assessment ?Pain Score: 4  ?Pain Location: R shoulder ?Pain Descriptors  / Indicators: Discomfort ?Pain Intervention(s): Monitored during session, Repositioned  ? ? ?Home Living   ?  ?  ?  ?  ?  ?  ?  ?  ?  ?   ?  ?Prior Function    ?  ?  ?   ? ?PT Goals (current goals can now be found in the care plan section) Progress towards PT goals: Progressing toward goals ? ?  ?Frequency ? ? ? Min 2X/week ? ? ? ?  ?PT Plan Current plan remains appropriate  ? ? ?Co-evaluation   ?  ?  ?  ?  ? ?  ?AM-PAC PT "6 Clicks" Mobility   ?Outcome Measure ? Help needed turning from your back to your side while in a flat bed without using bedrails?: None ?Help needed moving from lying on your back to sitting on the side of a flat bed without using bedrails?: None ?Help needed moving to and from a bed to a chair (including a wheelchair)?: A Little ?Help needed standing up from a chair using your arms (e.g., wheelchair or bedside chair)?: A Little ?Help needed to walk in hospital room?: A Little ?Help needed climbing 3-5 steps with a railing? : Total ?6 Click Score: 18 ? ?  ?End of Session Equipment Utilized During Treatment: Gait belt ?Activity Tolerance: Patient limited by fatigue;Treatment limited secondary to medical complications (Comment) (HR up to 140 with ambulation) ?Patient left: in bed ?  ?PT Visit Diagnosis: Unsteadiness on feet (R26.81);Other abnormalities of gait and mobility (R26.89);Muscle weakness (generalized) (M62.81);Other symptoms and signs involving the nervous system (R29.898);Pain ?Pain - Right/Left: Right ?Pain - part of body: Shoulder ?  ? ? ?Time: 9373-4287 ?PT Time Calculation (min) (ACUTE ONLY): 17 min ? ?Charges:  $Gait Training: 8-22 mins          ?          ? ?[name], PT ?Acute Rehabilitation Services ?GOTLX:[phone] ?Office:[phone] ?08/30/2021 ? ? ? ?[name] ?08/30/2021, 1:38 PM ? ?

## 2021-08-30 NOTE — Discharge Summary (Signed)
Physician Discharge Summary  ?[name] HFW:[id number] DOB: 04/19/1951 DOA: 08/27/2021 ? ?PCP: Reynold Bowen, MD ? ?Admit date: 08/27/2021 ?Discharge date: 08/30/2021 ? ?Time spent: 35 minutes ? ?Recommendations for Outpatient Follow-up:  ?Follow-up with primary care physician in 1 to 2 weeks ? ?Discharge Diagnoses:  ?Principal Problem: ?  Syncope ?Active Problems: ?  HLD (hyperlipidemia) ?  Acute on chronic anemia ?  AKI (acute kidney injury) ([place]) ?  Hyponatremia ?  Hypomagnesemia ?  Hypokalemia ?  Closed fracture of proximal end of right humerus ?  Generalized weakness ?  Hypotension ?  Chronic diastolic CHF (congestive heart failure) ([place]) ?  GERD (gastroesophageal reflux disease) ? ? ?Discharge Condition: Stable ? ? ?Filed Weights  ? 08/29/21 0400 08/29/21 1943 08/30/21 0420  ?Weight: 46.6 kg 46.2 kg 46.2 kg  ? ? ?History of present illness:  ? [name] is a 71 y.o. female with medical history significant for breast cancer status postchemotherapy, chronic iron deficiency anemia associated baseline hemoglobin 02-77, chronic diastolic heart failure, hypertension, hyperlipidemia, chronic hyponatremia with baseline serum sodium 130-134, who is admitted to [facility] on 08/27/2021 for further evaluation management of syncope after presenting from home to [facility] ED complaining of episode of loss of consciousness.  ?  ?The patient reports that she has been experiencing intermittent dizziness over the last 3 days, when she moves from a seated to a standing position.  She subsequently experienced a single episode of loss of consciousness earlier today when rising from a seated to a standing position, this was associated with preceding dizziness, lightheadedness, and subjective sensation of impending loss of consciousness.  She reports that this episode of syncope resulted in a ground-level fall to the floor, in which she struck her right side, and believes that she likely also struck her head.  This episode  was unwitnessed, and while the patient is not entirely sure as to the exact duration during which she was unconscious, she believes that it was only for a few seconds.  Not associate with any chest pain, shortness of breath, palpitations, nausea, vomiting.  No associated tongue biting or loss of bowel/bladder function.  Denies any ensuing episodes of loss of consciousness.  Is on daily Plavix, but otherwise on no additional blood thinners. ?  ?Denies any associated or ensuing headache. no associated acute focal weakness, acute focal numbness, paresthesias, facial droop, slurred speech, expressive aphasia, acute change in vision, dysphagia, vertigo. ?  ?Denies any recent worsening of edema in the lower extremities, nor any calf tenderness or new lower extremity edema.  Denies any recent preceding trauma, but does acknowledge a history of breast cancer, for which she completed her last session of chemotherapy approximately 2 months ago.  Denies any personal history of DVT/PE.  No recent travel.  ?  ?Denies any recent subjective fever, chills, rigors, or generalized myalgias.  No recent cough, rash, neck stiffness, abdominal pain, or diarrhea.  No recent dysuria or gross hematuria.  No recent melena or hematochezia. ?  ?Stemming from her loss of consciousness earlier today, she reports near constant right shoulder discomfort, worse with attempting to move the right upper extremity.  Denies any associated numbness or paresthesias. ?  ?Over the last week leading up to onset of intermittent dizziness, the patient reports a decline in oral intake of both food and water, as a consequence of associated decrease in appetite.  She lives at home, as her husband is currently residing in SNF for rehab.  ?  ?  She has a history of coronary artery disease status post bare-metal stents.  She also has a documented history of chronic diastolic heart failure, with most recent echo per chart review occurring in March 2015, at which time  LVEF was 55 to 60%, no focal motion antibiotics, grade 1 diastolic dysfunction, mild mitral vegetation and mild tricuspid regurgitation.  She is on HCTZ, but otherwise not on a diuretic medications.  ?  ?Medical history also notable for essential pretension, for which she is also on Coreg as well as losartan. ?  ?Per chart review, between December 2022 and 08/03/2021, serum sodium is ranged between 1 31-1 34.  Additionally, per chart review, from December 2022 through 08/03/2021, hemoglobin has been in the range of 10.1-11.5. ?  ?  ?  ?ED Course:  ?Vital signs in the ED were notable for the following: Afebrile; heart rate 67-76; initial blood pressure 86/54, sodium improving to 109/60 following interval administration of IV fluids, as further detailed below; respiratory rate 18 is 24, oxygen saturation 97 to 100% on room air. ?  ?Labs were notable for the following: CMP notable for the following: Sodium 126 compared to most recent prior value 134 on 08/03/2021, potassium 3.4, chloride 90, creatinine 1.17 relative to most recent prior serum creatinine did 1-0.68 on 08/03/2021, glucose 130, liver enzymes within normal limits.  Serum magnesium level 1.4.  Urinalysis showed no white blood cells no red blood cells, no protein, but was positive for hyaline cast.  Initial high-sensitivity troponin I 8.  CBC notable for white cell count 8000, hemoglobin 9.1 associated normocytic/normochromic findings as well as nonelevated RDW, compared to most recent prior hemoglobin of 11.5 on 08/03/2021, platelet count 136.  D-dimer 15.63.  INR 1.0. ?  ?Imaging and additional notable ED work-up: EKG shows sinus rhythm with nonspecific intraventricular conduction delay, heart rate 75, no evidence of T wave or ST changes, clear no evidence of ST elevation.  Chest x-ray showed no evidence of acute cardiopulmonary process.  Plain films of the right hand and right elbow showed no evidence of acute fracture or dislocation.  Plain films of the right  shoulder showed proximal right humeral fracture without evidence of dislocation.  Noncontrast CT head showed no evidence of acute intracranial process, including no evidence of intracranial hemorrhage or acute infarct.  CT cervical spine showed no evidence of acute fracture or subluxation injury to the cervical spine. ?  ?While in the ED, the following were administered: Lactated Ringer's x1 L bolus followed by continuous LR at 100 cc/h.  Magnesium sulfate 2 g IV over 2 hours x 1 dose now. ?  ?Subsequently, the patient was admitted for overnight observation for further evaluation management presenting syncope, with presentation associated with several laboratory abnormalities, including acute on chronic anemia, acute on chronic hyponatremia, acute kidney injury, hypomagnesemia, hypokalemia, and with presentation also notable for generalized weakness and acute proximal right humerus fracture. ?  ? ?Hospital Course:  ?Patient was admitted and found to be orthostatic.  Given IV fluids her blood pressure medications were held.  Over the course of the next several days she improved with IV fluids alone.  Her renal function improved and returned to normal.  Her orthostatic hypotension also clinically improved.  She had no focal neurological deficits while she was here.  Physical therapy was consulted recommending short-term rehab.  For this reason she is being discharged to short-term rehab facility coincidentally where her husband is also getting rehab.  She did suffer from an acute  proximal right humerus fracture from the fall she had initially on admission.  She will need to follow-up with orthopedic surgery in the next week for this.  They were called and recommended outpatient follow-up.  I have stopped her HCTZ combination pill.  Continued her Coreg. ? ? ?Discharge Exam: ?Vitals:  ? 08/30/21 0754 08/30/21 1148  ?BP: (!) 155/76 (!) 169/81  ?Pulse: 74 77  ?Resp: 16 16  ?Temp: 98.3 ?F (36.8 ?C) 98 ?F (36.7 ?C)  ?SpO2:  94% 96%  ? ? ?General: Alert and oriented x3 no apparent distress ?Cardiovascular: Regular rate and rhythm without murmurs rubs or gallops ?Respiratory: Clear to auscultation bilaterally no wheezes rhonchi

## 2021-08-30 NOTE — Progress Notes (Signed)
?Progress note ? ? ?[name] QZE:[id number] DOB: 1950/11/13 DOA: 08/27/2021 ? ?PCP: Reynold Bowen, MD  ?Patient coming from: home  ? ? ?Chief Complaint: Episode of loss of consciousness ? ?HPI: [name] is a 70 y.o. female with medical history significant for breast cancer status postchemotherapy, chronic iron deficiency anemia associated baseline hemoglobin 22-63, chronic diastolic heart failure, hypertension, hyperlipidemia, chronic hyponatremia with baseline serum sodium 130-134, who is admitted to [facility] on 08/27/2021 for further evaluation management of syncope after presenting from home to [facility] ED complaining of episode of loss of consciousness.  ? ?The patient reports that she has been experiencing intermittent dizziness over the last 3 days, when she moves from a seated to a standing position.  She subsequently experienced a single episode of loss of consciousness earlier today when rising from a seated to a standing position, this was associated with preceding dizziness, lightheadedness, and subjective sensation of impending loss of consciousness.  She reports that this episode of syncope resulted in a ground-level fall to the floor, in which she struck her right side, and believes that she likely also struck her head.  This episode was unwitnessed, and while the patient is not entirely sure as to the exact duration during which she was unconscious, she believes that it was only for a few seconds.  Not associate with any chest pain, shortness of breath, palpitations, nausea, vomiting.  No associated tongue biting or loss of bowel/bladder function.  Denies any ensuing episodes of loss of consciousness.  Is on daily Plavix, but otherwise on no additional blood thinners. ? ?Denies any associated or ensuing headache. no associated acute focal weakness, acute focal numbness, paresthesias, facial droop, slurred speech, expressive aphasia, acute change in vision, dysphagia, vertigo. ? ?Denies  any recent worsening of edema in the lower extremities, nor any calf tenderness or new lower extremity edema.  Denies any recent preceding trauma, but does acknowledge a history of breast cancer, for which she completed her last session of chemotherapy approximately 2 months ago.  Denies any personal history of DVT/PE.  No recent travel.  ? ?Denies any recent subjective fever, chills, rigors, or generalized myalgias.  No recent cough, rash, neck stiffness, abdominal pain, or diarrhea.  No recent dysuria or gross hematuria.  No recent melena or hematochezia. ? ?Stemming from her loss of consciousness earlier today, she reports near constant right shoulder discomfort, worse with attempting to move the right upper extremity.  Denies any associated numbness or paresthesias. ? ?Over the last week leading up to onset of intermittent dizziness, the patient reports a decline in oral intake of both food and water, as a consequence of associated decrease in appetite.  She lives at home, as her husband is currently residing in SNF for rehab.  ? ?She has a history of coronary artery disease status post bare-metal stents.  She also has a documented history of chronic diastolic heart failure, with most recent echo per chart review occurring in March 2015, at which time LVEF was 55 to 60%, no focal motion antibiotics, grade 1 diastolic dysfunction, mild mitral vegetation and mild tricuspid regurgitation.  She is on HCTZ, but otherwise not on a diuretic medications.  ? ?Medical history also notable for essential pretension, for which she is also on Coreg as well as losartan. ? ?Per chart review, between December 2022 and 08/03/2021, serum sodium is ranged between 1 31-1 34.  Additionally, per chart review, from December 2022 through 08/03/2021, hemoglobin has been in  the range of 10.1-11.5. ? ? ? ?ED Course:  ?Vital signs in the ED were notable for the following: Afebrile; heart rate 67-76; initial blood pressure 86/54, sodium  improving to 109/60 following interval administration of IV fluids, as further detailed below; respiratory rate 18 is 24, oxygen saturation 97 to 100% on room air. ? ?Labs were notable for the following: CMP notable for the following: Sodium 126 compared to most recent prior value 134 on 08/03/2021, potassium 3.4, chloride 90, creatinine 1.17 relative to most recent prior serum creatinine did 1-0.68 on 08/03/2021, glucose 130, liver enzymes within normal limits.  Serum magnesium level 1.4.  Urinalysis showed no white blood cells no red blood cells, no protein, but was positive for hyaline cast.  Initial high-sensitivity troponin I 8.  CBC notable for white cell count 8000, hemoglobin 9.1 associated normocytic/normochromic findings as well as nonelevated RDW, compared to most recent prior hemoglobin of 11.5 on 08/03/2021, platelet count 136.  D-dimer 15.63.  INR 1.0. ? ?Imaging and additional notable ED work-up: EKG shows sinus rhythm with nonspecific intraventricular conduction delay, heart rate 75, no evidence of T wave or ST changes, clear no evidence of ST elevation.  Chest x-ray showed no evidence of acute cardiopulmonary process.  Plain films of the right hand and right elbow showed no evidence of acute fracture or dislocation.  Plain films of the right shoulder showed proximal right humeral fracture without evidence of dislocation.  Noncontrast CT head showed no evidence of acute intracranial process, including no evidence of intracranial hemorrhage or acute infarct.  CT cervical spine showed no evidence of acute fracture or subluxation injury to the cervical spine. ? ?While in the ED, the following were administered: Lactated Ringer's x1 L bolus followed by continuous LR at 100 cc/h.  Magnesium sulfate 2 g IV over 2 hours x 1 dose now. ? ?Subsequently, the patient was admitted for overnight observation for further evaluation management presenting syncope, with presentation associated with several laboratory  abnormalities, including acute on chronic anemia, acute on chronic hyponatremia, acute kidney injury, hypomagnesemia, hypokalemia, and with presentation also notable for generalized weakness and acute proximal right humerus fracture. ? ? ? ? ?Objective  ? ? ?Physical Exam: ?Vitals:  ? 08/30/21 0420 08/30/21 0423 08/30/21 0754 08/30/21 1148  ?BP:  140/87 (!) 155/76 (!) 169/81  ?Pulse:   74 77  ?Resp:  '20 16 16  '$ ?Temp:  98.4 ?F (36.9 ?C) 98.3 ?F (36.8 ?C) 98 ?F (36.7 ?C)  ?TempSrc:  Oral Oral Oral  ?SpO2:  98% 94% 96%  ?Weight: 46.2 kg     ?Height:      ? ? ?General: appears to be stated age; alert, oriented ?Skin: warm, dry, no rash ?Head:  AT/[place] ?Mouth:  Oral mucosa membranes appear dry, normal dentition ?Neck: supple; trachea midline ?Heart:  RRR; did not appreciate any M/R/G ?Lungs: CTAB, did not appreciate any wheezes, rales, or rhonchi ?Abdomen: + BS; soft, ND, NT ?Vascular: 2+ pedal pulses b/l; 2+ radial pulses b/l ?Extremities: no peripheral edema, no muscle wasting ?Neuro: Deferred strength assessment of the right upper extremity in the setting of acute proximal right humerus fracture; 5 out of 5 strength in right lower extremity and 5 out of 5 strength in upper and lower extremity on left; sensation intact in upper and lower extremities bilaterally. ? ? ? ?Labs on Admission: I have personally reviewed following labs and imaging studies ? ?CBC: ?Recent Labs  ?Lab 08/27/21 ?1830 08/28/21 ?0419 08/29/21 ?3149 08/30/21 ?0130  ?  WBC 8.0 5.4 4.9 5.8  ?NEUTROABS 6.4 4.0 3.9 4.5  ?HGB 9.1* 8.1* 7.6* 7.2*  ?HCT 26.3* 23.6* 22.0* 21.1*  ?MCV 95.3 94.8 94.4 95.0  ?PLT 136* 108* 104* 115*  ? ?Basic Metabolic Panel: ?Recent Labs  ?Lab 08/27/21 ?1830 08/28/21 ?0419 08/29/21 ?8016 08/30/21 ?0130  ?NA 126* 128* 129* 129*  ?K 3.4* 3.0* 3.7 3.4*  ?CL 90* 93* 95* 94*  ?CO2 '26 26 27 26  '$ ?GLUCOSE 130* 109* 100* 97  ?BUN 19 13 7* 5*  ?CREATININE 1.17* 0.76 0.62 0.68  ?CALCIUM 8.6* 8.6* 8.4* 8.5*  ?MG 1.4* 1.9  --   --    ? ?GFR: ?Estimated Creatinine Clearance: 48.4 mL/min (by C-G formula based on SCr of 0.68 mg/dL). ?Liver Function Tests: ?Recent Labs  ?Lab 08/27/21 ?1830 08/28/21 ?0419  ?AST 29 21  ?ALT 15 10  ?ALKPHOS 44 46  ?BILITOT 0.9 1

## 2021-08-30 NOTE — Progress Notes (Signed)
[facility] [place] to be D/C'd Skilled nursing facility per MD order.  Discussed with the patient and all questions fully answered. ? ?VSS, Skin clean, dry and intact without evidence of skin break down, no evidence of skin tears noted. ?IV catheter discontinued intact. Site without signs and symptoms of complications. Dressing and pressure applied. ? ?An After Visit Summary was printed and given to the patient. Patient received prescription. ? ?D/c education completed with patient/family including follow up instructions, medication list, d/c activities limitations if indicated, with other d/c instructions as indicated by MD - patient able to verbalize understanding, all questions fully answered.  ? ?Patient instructed to return to ED, call 911, or call MD for any changes in condition.  ? ?Patient escorted via [place], and D/C home via private auto. ? ?[place] ?08/30/2021 4:28 PM  ?

## 2021-08-30 NOTE — Progress Notes (Signed)
Occupational Therapy Treatment ?Patient Details ?Name: [name] ?MRN: [id number] ?DOB: June 08, 1950 ?Today's Date: 08/30/2021 ? ? ?History of present illness Pt is a 71 yo Female presenting to the ED 08/27/21 after a loss of consciousness, falls x6 in 24 hours, and persistent dizziness/lightheadedness x3 days. I[place]g (+) for mild displaced fx of the R proximal humerus. PMH includes breast CA, HTN, CAD, CHF, AAA, PAD. ?  ?OT comments ? Pt supine in bed and agreeable to OT session, reports fatigued from PT session.  Patient completing bed mobility with supervision, mod assist for LB aDLs.  When managing socks- pt requires cueing to avoid using R UE and keeping supported in sling.  Educated on use of pillow under UE to support, sling mgmt and precautions.  Pt HR up to 130s at EOB, requested to rest after ADLs at EOB.  Will follow acutely.   ? ?Recommendations for follow up therapy are one component of a multi-disciplinary discharge planning process, led by the attending physician.  Recommendations may be updated based on patient status, additional functional criteria and insurance authorization. ?   ?Follow Up Recommendations ? Skilled nursing-short term rehab (<3 hours/day)  ?  ?Assistance Recommended at Discharge Frequent or constant Supervision/Assistance  ?Patient can return home with the following ? A little help with walking and/or transfers;A lot of help with bathing/dressing/bathroom;Assistance with cooking/housework;Assist for transportation;Help with stairs or ramp for entrance ?  ?Equipment Recommendations ? Other (comment) (defer)  ?  ?Recommendations for Other Services   ? ?  ?Precautions / Restrictions Precautions ?Precautions: Fall ?Precaution Comments: orthstatic hypotension ?Required Braces or Orthoses: Sling ?Restrictions ?Weight Bearing Restrictions: Yes ?RUE Weight Bearing: Non weight bearing  ? ? ?  ? ?Mobility Bed Mobility ?Overal bed mobility: Needs Assistance ?  ?  ?  ?Supine to sit:  Supervision ?Sit to supine: Supervision ?  ?General bed mobility comments: for safety, cueing for safety with RUE ?  ? ?Transfers ?  ?  ?  ?  ?  ?  ?  ?  ?  ?  ?  ?  ?Balance Overall balance assessment: Needs assistance ?Sitting-balance support: Feet supported ?Sitting balance-Leahy Scale: Good ?Sitting balance - Comments: supervision ?  ?  ?  ?  ?  ?  ?  ?  ?  ?  ?  ?  ?  ?  ?  ?   ? ?ADL either performed or assessed with clinical judgement  ? ?ADL Overall ADL's : Needs assistance/impaired ?  ?  ?Grooming: Minimal assistance;Sitting;Brushing hair ?  ?  ?  ?  ?  ?  ?  ?Lower Body Dressing: Moderate assistance;Sitting/lateral leans ?Lower Body Dressing Details (indicate cue type and reason): able to manage socks with increased time, cueing to not use RUE. ?  ?  ?  ?  ?  ?  ?Functional mobility during ADLs: Supervision/safety (to EOB) ?General ADL Comments: pt reports fatigued from PT session, requests to return back to supine after ADLs at EOB ?  ? ?Extremity/Trunk Assessment   ?  ?  ?  ?  ?  ? ?Vision   ?  ?  ?Perception   ?  ?Praxis   ?  ? ?Cognition Arousal/Alertness: Awake/alert ?Behavior During Therapy: Flat affect ?Overall Cognitive Status: Impaired/Different from baseline ?Area of Impairment: Problem solving, Safety/judgement, Awareness ?  ?  ?  ?  ?  ?  ?  ?  ?  ?  ?  ?  ?Safety/Judgement: Decreased awareness of safety ?Awareness:  Emergent ?Problem Solving: Slow processing, Requires verbal cues ?General Comments: pt requires cueing to not use R UE when reaching and completing ADLs ?  ?  ?   ?Exercises Exercises: Other exercises ?Other Exercises ?Other Exercises: x 10 reps 1 set: hand flexion/extension, wrist flexion/extension, supination/pronation ? ?  ?Shoulder Instructions   ? ? ?  ?General Comments repositioned sling, reviewed donning/doffing and positioning  ? ? ?Pertinent Vitals/ Pain       Pain Assessment ?Pain Assessment: Faces ?Faces Pain Scale: Hurts a little bit ?Pain Location: R shoulder ?Pain  Descriptors / Indicators: Discomfort ?Pain Intervention(s): Limited activity within patient's tolerance, Monitored during session, Repositioned ? ?Home Living   ?  ?  ?  ?  ?  ?  ?  ?  ?  ?  ?  ?  ?  ?  ?  ?  ?  ?  ? ?  ?Prior Functioning/Environment    ?  ?  ?  ?   ? ?Frequency ? Min 2X/week  ? ? ? ? ?  ?Progress Toward Goals ? ?OT Goals(current goals can now be found in the care plan section) ? Progress towards OT goals: Progressing toward goals ? ?Acute Rehab OT Goals ?Patient Stated Goal: get to SNF ?OT Goal Formulation: With patient ?Time For Goal Achievement: 09/11/21 ?Potential to Achieve Goals: Good  ?Plan Discharge plan remains appropriate;Frequency remains appropriate   ? ?Co-evaluation ? ? ?   ?  ?  ?  ?  ? ?  ?AM-PAC OT "6 Clicks" Daily Activity     ?Outcome Measure ? ? Help from another person eating meals?: A Little ?Help from another person taking care of personal grooming?: A Little ?Help from another person toileting, which includes using toliet, bedpan, or urinal?: A Lot ?Help from another person bathing (including washing, rinsing, drying)?: A Lot ?Help from another person to put on and taking off regular upper body clothing?: A Lot ?Help from another person to put on and taking off regular lower body clothing?: A Lot ?6 Click Score: 14 ? ?  ?End of Session Equipment Utilized During Treatment: Other (comment) (sling) ? ?OT Visit Diagnosis: Muscle weakness (generalized) (M62.81);Pain;History of falling (Z91.81);Unsteadiness on feet (R26.81) ?Pain - Right/Left: Right ?Pain - part of body: Shoulder ?  ?Activity Tolerance Patient tolerated treatment well ?  ?Patient Left in bed;with call bell/phone within reach;with bed alarm set ?  ?Nurse Communication Mobility status ?  ? ?   ? ?Time: 5009-3818 ?OT Time Calculation (min): 16 min ? ?Charges: OT General Charges ?$OT Visit: 1 Visit ?OT Treatments ?$Self Care/Home Management : 8-22 mins ? ?Jolaine Artist, OT ?Acute Rehabilitation Services ?Pager  [phone] ?Office [phone] ? ? ?[place]ight [place] ?08/30/2021, 1:45 PM ?

## 2021-08-30 NOTE — TOC Progression Note (Addendum)
Transition of Care (TOC) - Progression Note  ? ? ?Patient Details  ?Name: [name] ?MRN: [id number] ?Date of Birth: 25-Aug-1950 ? ?Transition of Care (TOC) CM/SW Contact  ?[place], LCSW ?Phone Number: ?08/30/2021, 1:36 PM ? ?Clinical Narrative:    ? ?Pt to DC to [facility] pending SNF auth.  ? ?1030: CSW called [facility] to check on auth; auth still pending ? ?1235: CSW called [facility] to check on auth; auth still pending. CSW informed they will cal when auth decision is made.  ? ?3299: CSW received call from [facility] for SNF and PTAR approval.  ?SNF Auth# [id number] ?PTAR [id number] ? ?Expected Discharge Plan: [place] ?Barriers to Discharge: Insurance Authorization ? ?Expected Discharge Plan and Services ?Expected Discharge Plan: [place] ?In-house Referral: Clinical Social Work ?  ?Post Acute Care Choice: [place] ?Living arrangements for the past 2 months: [place] ?                ?  ?  ?  ?  ?  ?  ?  ?  ?  ?  ? ? ?Social Determinants of Health (SDOH) Interventions ?  ? ?Readmission Risk Interventions ?   ? View : No data to display.  ?  ?  ?  ? ? ?

## 2021-08-30 NOTE — TOC Transition Note (Signed)
Transition of Care (TOC) - CM/SW Discharge Note ? ? ?Patient Details  ?Name: RAETTA AGOSTINELLI ?MRN: [id number] ?Date of Birth: 07-15-1950 ? ?Transition of Care (TOC) CM/SW Contact:  ?[place], LCSW ?Phone Number: ?08/30/2021, 2:15 PM ? ? ?Clinical Narrative:    ? ?Patient will DC to: [facility] ?Anticipated DC date: 08/30/21 ?Family notified: Left voicemail with Coral Spikes, Friend ?Transport by: [facility] will transport pt.  ? ? ?Per MD patient ready for DC to [facility]. RN, patient, patient's family, and facility notified of DC. Discharge Summary and FL2 sent to facility. RN to call report prior to discharge [phone] Room 201). Pt's friend Leta Baptist is completing paperwork at [facility] and then will come and pick pt up to transport her.  ? ?CSW will sign off for now as social work intervention is no longer needed. Please consult [place] again if new needs arise.  ? ? ? ?Final next level of care: [place] ?Barriers to Discharge: No Barriers Identified ? ? ?Patient Goals and CMS Choice ?Patient states their goals for this hospitalization and ongoing recovery are:: Rehab ?CMS Medicare.gov Compare Post Acute Care list provided to:: Patient ?Choice offered to / list presented to : Patient ? ?Discharge Placement ?  ?           ?[facility] at: [facility] ?Patient to be transferred to facility by: [facility] will drive her ?Name of family member notified: Arbita Calloway ?Patient and family notified of of transfer: 08/30/21 ? ?Discharge Plan and Services ?In-house Referral: Clinical Social Work ?  ?Post Acute Care Choice: [place]          ?  ?  ?  ?  ?  ?  ?  ?  ?  ?  ? ?Social Determinants of Health (SDOH) Interventions ?  ? ? ?Readmission Risk Interventions ?   ? View : No data to display.  ?  ?  ?  ? ? ? ? ? ?

## 2021-08-31 DIAGNOSIS — E119 Type 2 diabetes mellitus without complications: Secondary | ICD-10-CM | POA: Diagnosis not present

## 2021-08-31 DIAGNOSIS — S42309A Unspecified fracture of shaft of humerus, unspecified arm, initial encounter for closed fracture: Secondary | ICD-10-CM | POA: Diagnosis not present

## 2021-08-31 DIAGNOSIS — R55 Syncope and collapse: Secondary | ICD-10-CM | POA: Diagnosis not present

## 2021-08-31 DIAGNOSIS — I10 Essential (primary) hypertension: Secondary | ICD-10-CM | POA: Diagnosis not present

## 2021-08-31 DIAGNOSIS — E46 Unspecified protein-calorie malnutrition: Secondary | ICD-10-CM | POA: Diagnosis not present

## 2021-08-31 DIAGNOSIS — R269 Unspecified abnormalities of gait and mobility: Secondary | ICD-10-CM | POA: Diagnosis not present

## 2021-08-31 DIAGNOSIS — E559 Vitamin D deficiency, unspecified: Secondary | ICD-10-CM | POA: Diagnosis not present

## 2021-09-01 DIAGNOSIS — D649 Anemia, unspecified: Secondary | ICD-10-CM | POA: Diagnosis not present

## 2021-09-01 DIAGNOSIS — I10 Essential (primary) hypertension: Secondary | ICD-10-CM | POA: Diagnosis not present

## 2021-09-01 DIAGNOSIS — N179 Acute kidney failure, unspecified: Secondary | ICD-10-CM | POA: Diagnosis not present

## 2021-09-01 DIAGNOSIS — S42301D Unspecified fracture of shaft of humerus, right arm, subsequent encounter for fracture with routine healing: Secondary | ICD-10-CM | POA: Diagnosis not present

## 2021-09-01 DIAGNOSIS — E871 Hypo-osmolality and hyponatremia: Secondary | ICD-10-CM | POA: Diagnosis not present

## 2021-09-01 DIAGNOSIS — E876 Hypokalemia: Secondary | ICD-10-CM | POA: Diagnosis not present

## 2021-09-01 DIAGNOSIS — E782 Mixed hyperlipidemia: Secondary | ICD-10-CM | POA: Diagnosis not present

## 2021-09-01 DIAGNOSIS — M6281 Muscle weakness (generalized): Secondary | ICD-10-CM | POA: Diagnosis not present

## 2021-09-01 DIAGNOSIS — I959 Hypotension, unspecified: Secondary | ICD-10-CM | POA: Diagnosis not present

## 2021-09-01 DIAGNOSIS — I5032 Chronic diastolic (congestive) heart failure: Secondary | ICD-10-CM | POA: Diagnosis not present

## 2021-09-01 DIAGNOSIS — R55 Syncope and collapse: Secondary | ICD-10-CM | POA: Diagnosis not present

## 2021-09-07 DIAGNOSIS — R269 Unspecified abnormalities of gait and mobility: Secondary | ICD-10-CM | POA: Diagnosis not present

## 2021-09-07 DIAGNOSIS — I951 Orthostatic hypotension: Secondary | ICD-10-CM | POA: Diagnosis not present

## 2021-09-07 DIAGNOSIS — E871 Hypo-osmolality and hyponatremia: Secondary | ICD-10-CM | POA: Diagnosis not present

## 2021-09-07 DIAGNOSIS — I5032 Chronic diastolic (congestive) heart failure: Secondary | ICD-10-CM | POA: Diagnosis not present

## 2021-09-07 DIAGNOSIS — Z7902 Long term (current) use of antithrombotics/antiplatelets: Secondary | ICD-10-CM | POA: Diagnosis not present

## 2021-09-07 DIAGNOSIS — E876 Hypokalemia: Secondary | ICD-10-CM | POA: Diagnosis not present

## 2021-09-07 DIAGNOSIS — S42201D Unspecified fracture of upper end of right humerus, subsequent encounter for fracture with routine healing: Secondary | ICD-10-CM | POA: Diagnosis not present

## 2021-09-07 DIAGNOSIS — Z9181 History of falling: Secondary | ICD-10-CM | POA: Diagnosis not present

## 2021-09-07 DIAGNOSIS — C50919 Malignant neoplasm of unspecified site of unspecified female breast: Secondary | ICD-10-CM | POA: Diagnosis not present

## 2021-09-07 DIAGNOSIS — S42201A Unspecified fracture of upper end of right humerus, initial encounter for closed fracture: Secondary | ICD-10-CM | POA: Diagnosis not present

## 2021-09-07 DIAGNOSIS — I110 Hypertensive heart disease with heart failure: Secondary | ICD-10-CM | POA: Diagnosis not present

## 2021-09-07 DIAGNOSIS — S42294D Other nondisplaced fracture of upper end of right humerus, subsequent encounter for fracture with routine healing: Secondary | ICD-10-CM | POA: Diagnosis not present

## 2021-09-07 DIAGNOSIS — C50912 Malignant neoplasm of unspecified site of left female breast: Secondary | ICD-10-CM | POA: Diagnosis not present

## 2021-09-07 DIAGNOSIS — E785 Hyperlipidemia, unspecified: Secondary | ICD-10-CM | POA: Diagnosis not present

## 2021-09-07 DIAGNOSIS — I10 Essential (primary) hypertension: Secondary | ICD-10-CM | POA: Diagnosis not present

## 2021-09-07 DIAGNOSIS — D509 Iron deficiency anemia, unspecified: Secondary | ICD-10-CM | POA: Diagnosis not present

## 2021-09-07 DIAGNOSIS — K219 Gastro-esophageal reflux disease without esophagitis: Secondary | ICD-10-CM | POA: Diagnosis not present

## 2021-09-10 DIAGNOSIS — R55 Syncope and collapse: Secondary | ICD-10-CM | POA: Diagnosis not present

## 2021-09-10 DIAGNOSIS — E785 Hyperlipidemia, unspecified: Secondary | ICD-10-CM | POA: Diagnosis not present

## 2021-09-10 DIAGNOSIS — S42201A Unspecified fracture of upper end of right humerus, initial encounter for closed fracture: Secondary | ICD-10-CM | POA: Diagnosis not present

## 2021-09-10 DIAGNOSIS — I5032 Chronic diastolic (congestive) heart failure: Secondary | ICD-10-CM | POA: Diagnosis not present

## 2021-09-12 ENCOUNTER — Ambulatory Visit: Payer: PPO | Admitting: Orthopaedic Surgery

## 2021-09-12 ENCOUNTER — Ambulatory Visit (INDEPENDENT_AMBULATORY_CARE_PROVIDER_SITE_OTHER): Payer: PPO

## 2021-09-12 ENCOUNTER — Encounter: Payer: Self-pay | Admitting: Orthopaedic Surgery

## 2021-09-12 VITALS — BP 135/73 | Ht 63.0 in | Wt 96.0 lb

## 2021-09-12 DIAGNOSIS — S42201A Unspecified fracture of upper end of right humerus, initial encounter for closed fracture: Secondary | ICD-10-CM

## 2021-09-12 NOTE — Progress Notes (Signed)
? ?Office Visit Note ?  ?Patient: [name]           ?Date of Birth: [birth year]           ?MRN: [id number] ?Visit Date: 09/12/2021 ?             ?Requested by: Reynold Bowen, MD ?[address] ?[place],  [place] [address] ?PCP: Reynold Bowen, MD ? ? ?Assessment & Plan: ?Visit Diagnoses:  ?1. Closed fracture of proximal end of right humerus, unspecified fracture morphology, initial encounter   ? ? ?Plan: Patient can progress with gentle circles elephant swings and use of a cane supine position working on active assistive range of motion.  I will recheck her in 2 weeks and make a decision about formal physical therapy referral as an outpt. ? ?Follow-Up Instructions: No follow-ups on file.  ? ?Orders:  ?Orders Placed This Encounter  ?Procedures  ? XR Shoulder Right  ? ?No orders of the defined types were placed in this encounter. ? ? ? ? Procedures: ?No procedures performed ? ? ?Clinical Data: ?No additional findings. ? ? ?Subjective: ?Chief Complaint  ?Patient presents with  ? Right Shoulder - Fracture  ?  Fall 08/27/2021  ? ? ?HPI 71 year old female follow-up syncopal fall 08/27/2021 with proximal humerus fracture on the right nondisplaced.  Patient wearing a sling all the time denies pain.  She does have some peripheral neuropathy uses a cane to help with balance.  She states her blood pressure was low her medications have been changed.  1 point she fell into the refrigerator and hit her arm.  Patient is right-hand dominant.  Patient was at [facility] and then went to [facility] for 1 week.  Therapy has been coming out to her house. ? ?Review of Systems all other systems noncontributory to HPI. ? ? ?Objective: ?Vital Signs: BP 135/73   Ht '5\' 3"'$  (1.6 m)   Wt 96 lb (43.5 kg)   BMI 17.01 kg/m?  ? ?Physical Exam ?Constitutional:   ?   Appearance: She is well-developed.  ?HENT:  ?   Head: Normocephalic.  ?   Right Ear: External ear normal.  ?   Left Ear: External ear normal. There is no impacted cerumen.  ?Eyes:  ?    Pupils: Pupils are equal, round, and reactive to light.  ?Neck:  ?   Thyroid: No thyromegaly.  ?   Trachea: No tracheal deviation.  ?Cardiovascular:  ?   Rate and Rhythm: Normal rate.  ?Pulmonary:  ?   Effort: Pulmonary effort is normal.  ?Abdominal:  ?   Palpations: Abdomen is soft.  ?Musculoskeletal:  ?   Cervical back: No rigidity.  ?Skin: ?   General: Skin is warm and dry.  ?Neurological:  ?   Mental Status: She is alert and oriented to person, place, and time.  ?Psychiatric:     ?   Behavior: Behavior normal.  ? ? ?Ortho Exam patient has 40 degrees flexion extension of her shoulder with minimal discomfort.  Station hand is intact.  Ecchymosis has resolved. ? ?Specialty Comments:  ?No specialty comments available. ? ?Imaging: ?No results found. ? ? ?PMFS History: ?Patient Active Problem List  ? Diagnosis Date Noted  ? AKI (acute kidney injury) ([place]) 08/28/2021  ? Hyponatremia 08/28/2021  ? Hypomagnesemia 08/28/2021  ? Hypokalemia 08/28/2021  ? Closed fracture of proximal end of right humerus 08/28/2021  ? Generalized weakness 08/28/2021  ? Hypotension 08/28/2021  ? Chronic diastolic CHF (congestive heart failure) ([place]) 08/28/2021  ?  GERD (gastroesophageal reflux disease) 08/28/2021  ? Syncope 08/27/2021  ? Peripheral arterial disease ([place]) 04/26/2019  ? Stenosis of left subclavian artery ([place]) 03/12/2019  ? Paresthesia 07/13/2018  ? Gait abnormality 07/13/2018  ? Acute on chronic anemia 01/16/2016  ? Left carotid bruit 01/16/2016  ? History of colonic polyps 04/20/2014  ? Chronic anticoagulation 04/20/2014  ? Multiple adenomatous polyps 01/24/2014  ? Lower GI bleed 01/05/2014  ? Post-polypectomy bleeding 01/05/2014  ? NONSPECIFIC ABN FINDNG RAD&OTH EXAM BILARY TRCT 11/09/2008  ? Nonspecific (abnormal) findings on radiological and other examination of body structure 11/01/2008  ? CT, CHEST, ABNORMAL 11/01/2008  ? ABDOMINAL AORTIC ANEURYSM 10/26/2008  ? HLD (hyperlipidemia) 10/25/2008  ? TOBACCO ABUSE 10/25/2008   ? Essential hypertension 10/25/2008  ? Coronary atherosclerosis 10/25/2008  ? ISCHEMIC CARDIOMYOPATHY 10/25/2008  ? Congestive heart failure ([place]) 10/25/2008  ? ?Past Medical History:  ?Diagnosis Date  ? ABDOMINAL AORTIC ANEURYSM   ? Anemia   ? Arthritis   ? HANDS  ? Blood transfusion without reported diagnosis   ? AUGUST 5TH 2017 x2   ? Breast cancer ([place])   ? CAD   ? CONGESTIVE HEART FAILURE, SYSTOLIC, CHRONIC   ? CT, CHEST, ABNORMAL   ? GERD (gastroesophageal reflux disease)   ? Heart attack ([place])   ? x 2  ? History of colon polyps 01/05/2014  ? adenomatous polyps  ? HYPERLIPIDEMIA   ? HYPERTENSION   ? ISCHEMIC CARDIOMYOPATHY   ? Lower GI bleed 01/2014  ? NONSPECIFIC ABN FINDNG RAD&OTH EXAM BILARY TRCT   ? Numbness and tingling   ? 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 Paresthesia of both hands   ? TOBACCO ABUSE   ?  ?Family History  ?Problem Relation Age of Onset  ? Lymphoma Mother 62  ? Colon polyps Father   ? Depression Father 16  ?     died of suicide  ? Heart attack Father   ? Diabetes Maternal Grandmother   ? Anemia Sister   ? Colon polyps Sister   ? Lung cancer Sister   ? Colon cancer Neg Hx   ? Esophageal cancer Neg Hx   ? Rectal cancer Neg Hx   ? Stomach cancer Neg Hx   ?  ?Past Surgical History:  ?Procedure Laterality Date  ? bare metal stent placement    ? left anterior descending artery x 2  ? COLONOSCOPY N/A 01/05/2014  ? Procedure: COLONOSCOPY;  Surgeon: Jerene Bears, MD;  Location: [facility] END[facility]COPY;  Service: Endoscopy;  Laterality: N/A;  ? COLONOSCOPY N/A 01/06/2014  ? Procedure: COLONOSCOPY;  Surgeon: Jerene Bears, MD;  Location: [facility] ENDOSCOPY;  Service: Endoscopy;  Laterality: N/A;  ? COLONOSCOPY    ? POLYPECTOMY    ? ?Social History  ? ?Occupational History  ? Occupation: retired  ?Tobacco Use  ? Smoking status: Former  ?  Types: Cigarettes, E-cigarettes  ?  Quit date: 2013  ?  Years since quitting: 10.2  ?  Passive exposure: Never  ? Smokeless tobacco: Never  ?Vaping Use  ? Vaping Use:  Some days  ?Substance and Sexual Activity  ? Alcohol use: Not Currently  ? Drug use: No  ? Sexual activity: Not on file  ? ? ? ? ? ? ?

## 2021-09-13 DIAGNOSIS — D509 Iron deficiency anemia, unspecified: Secondary | ICD-10-CM | POA: Diagnosis not present

## 2021-09-13 DIAGNOSIS — E785 Hyperlipidemia, unspecified: Secondary | ICD-10-CM | POA: Diagnosis not present

## 2021-09-13 DIAGNOSIS — I110 Hypertensive heart disease with heart failure: Secondary | ICD-10-CM | POA: Diagnosis not present

## 2021-09-13 DIAGNOSIS — I951 Orthostatic hypotension: Secondary | ICD-10-CM | POA: Diagnosis not present

## 2021-09-13 DIAGNOSIS — I5032 Chronic diastolic (congestive) heart failure: Secondary | ICD-10-CM | POA: Diagnosis not present

## 2021-09-13 DIAGNOSIS — C50919 Malignant neoplasm of unspecified site of unspecified female breast: Secondary | ICD-10-CM | POA: Diagnosis not present

## 2021-09-13 DIAGNOSIS — E871 Hypo-osmolality and hyponatremia: Secondary | ICD-10-CM | POA: Diagnosis not present

## 2021-09-13 DIAGNOSIS — R269 Unspecified abnormalities of gait and mobility: Secondary | ICD-10-CM | POA: Diagnosis not present

## 2021-09-13 DIAGNOSIS — E876 Hypokalemia: Secondary | ICD-10-CM | POA: Diagnosis not present

## 2021-09-13 DIAGNOSIS — S42201D Unspecified fracture of upper end of right humerus, subsequent encounter for fracture with routine healing: Secondary | ICD-10-CM | POA: Diagnosis not present

## 2021-09-13 DIAGNOSIS — S42201A Unspecified fracture of upper end of right humerus, initial encounter for closed fracture: Secondary | ICD-10-CM | POA: Diagnosis not present

## 2021-09-13 DIAGNOSIS — K219 Gastro-esophageal reflux disease without esophagitis: Secondary | ICD-10-CM | POA: Diagnosis not present

## 2021-09-13 DIAGNOSIS — Z9181 History of falling: Secondary | ICD-10-CM | POA: Diagnosis not present

## 2021-09-13 DIAGNOSIS — Z7902 Long term (current) use of antithrombotics/antiplatelets: Secondary | ICD-10-CM | POA: Diagnosis not present

## 2021-09-14 DIAGNOSIS — S42294D Other nondisplaced fracture of upper end of right humerus, subsequent encounter for fracture with routine healing: Secondary | ICD-10-CM | POA: Diagnosis not present

## 2021-09-14 DIAGNOSIS — Z170 Estrogen receptor positive status [ER+]: Secondary | ICD-10-CM | POA: Diagnosis not present

## 2021-09-14 DIAGNOSIS — D649 Anemia, unspecified: Secondary | ICD-10-CM | POA: Diagnosis not present

## 2021-09-14 DIAGNOSIS — C50912 Malignant neoplasm of unspecified site of left female breast: Secondary | ICD-10-CM | POA: Diagnosis not present

## 2021-09-14 DIAGNOSIS — G609 Hereditary and idiopathic neuropathy, unspecified: Secondary | ICD-10-CM | POA: Diagnosis not present

## 2021-09-14 DIAGNOSIS — I10 Essential (primary) hypertension: Secondary | ICD-10-CM | POA: Diagnosis not present

## 2021-09-14 DIAGNOSIS — R55 Syncope and collapse: Secondary | ICD-10-CM | POA: Diagnosis not present

## 2021-09-14 DIAGNOSIS — I714 Abdominal aortic aneurysm, without rupture, unspecified: Secondary | ICD-10-CM | POA: Diagnosis not present

## 2021-09-14 DIAGNOSIS — D509 Iron deficiency anemia, unspecified: Secondary | ICD-10-CM | POA: Diagnosis not present

## 2021-09-14 DIAGNOSIS — I5032 Chronic diastolic (congestive) heart failure: Secondary | ICD-10-CM | POA: Diagnosis not present

## 2021-09-14 DIAGNOSIS — E785 Hyperlipidemia, unspecified: Secondary | ICD-10-CM | POA: Diagnosis not present

## 2021-09-19 DIAGNOSIS — I5032 Chronic diastolic (congestive) heart failure: Secondary | ICD-10-CM | POA: Diagnosis not present

## 2021-09-19 DIAGNOSIS — E876 Hypokalemia: Secondary | ICD-10-CM | POA: Diagnosis not present

## 2021-09-19 DIAGNOSIS — D509 Iron deficiency anemia, unspecified: Secondary | ICD-10-CM | POA: Diagnosis not present

## 2021-09-19 DIAGNOSIS — S42201A Unspecified fracture of upper end of right humerus, initial encounter for closed fracture: Secondary | ICD-10-CM | POA: Diagnosis not present

## 2021-09-19 DIAGNOSIS — R269 Unspecified abnormalities of gait and mobility: Secondary | ICD-10-CM | POA: Diagnosis not present

## 2021-09-19 DIAGNOSIS — C50919 Malignant neoplasm of unspecified site of unspecified female breast: Secondary | ICD-10-CM | POA: Diagnosis not present

## 2021-09-19 DIAGNOSIS — E871 Hypo-osmolality and hyponatremia: Secondary | ICD-10-CM | POA: Diagnosis not present

## 2021-09-19 DIAGNOSIS — I110 Hypertensive heart disease with heart failure: Secondary | ICD-10-CM | POA: Diagnosis not present

## 2021-09-19 DIAGNOSIS — S42201D Unspecified fracture of upper end of right humerus, subsequent encounter for fracture with routine healing: Secondary | ICD-10-CM | POA: Diagnosis not present

## 2021-09-19 DIAGNOSIS — E785 Hyperlipidemia, unspecified: Secondary | ICD-10-CM | POA: Diagnosis not present

## 2021-09-19 DIAGNOSIS — K219 Gastro-esophageal reflux disease without esophagitis: Secondary | ICD-10-CM | POA: Diagnosis not present

## 2021-09-19 DIAGNOSIS — Z7902 Long term (current) use of antithrombotics/antiplatelets: Secondary | ICD-10-CM | POA: Diagnosis not present

## 2021-09-19 DIAGNOSIS — I951 Orthostatic hypotension: Secondary | ICD-10-CM | POA: Diagnosis not present

## 2021-09-19 DIAGNOSIS — Z9181 History of falling: Secondary | ICD-10-CM | POA: Diagnosis not present

## 2021-09-22 ENCOUNTER — Emergency Department (HOSPITAL_COMMUNITY)
Admission: EM | Admit: 2021-09-22 | Discharge: 2021-09-23 | Disposition: A | Discharge status: Home / Self Care | Payer: PPO | Attending: Emergency Medicine | Admitting: Emergency Medicine

## 2021-09-22 ENCOUNTER — Emergency Department (HOSPITAL_COMMUNITY): Payer: PPO

## 2021-09-22 DIAGNOSIS — S4991XA Unspecified injury of right shoulder and upper arm, initial encounter: Secondary | ICD-10-CM | POA: Diagnosis present

## 2021-09-22 DIAGNOSIS — M25521 Pain in right elbow: Secondary | ICD-10-CM | POA: Diagnosis not present

## 2021-09-22 DIAGNOSIS — I110 Hypertensive heart disease with heart failure: Secondary | ICD-10-CM | POA: Diagnosis not present

## 2021-09-22 DIAGNOSIS — M79601 Pain in right arm: Secondary | ICD-10-CM | POA: Diagnosis not present

## 2021-09-22 DIAGNOSIS — Z79899 Other long term (current) drug therapy: Secondary | ICD-10-CM | POA: Diagnosis not present

## 2021-09-22 DIAGNOSIS — Z853 Personal history of malignant neoplasm of breast: Secondary | ICD-10-CM | POA: Diagnosis not present

## 2021-09-22 DIAGNOSIS — I2510 Atherosclerotic heart disease of native coronary artery without angina pectoris: Secondary | ICD-10-CM | POA: Insufficient documentation

## 2021-09-22 DIAGNOSIS — I509 Heart failure, unspecified: Secondary | ICD-10-CM | POA: Diagnosis not present

## 2021-09-22 DIAGNOSIS — S42211A Unspecified displaced fracture of surgical neck of right humerus, initial encounter for closed fracture: Secondary | ICD-10-CM | POA: Diagnosis not present

## 2021-09-22 DIAGNOSIS — I10 Essential (primary) hypertension: Secondary | ICD-10-CM | POA: Diagnosis not present

## 2021-09-22 DIAGNOSIS — W19XXXA Unspecified fall, initial encounter: Secondary | ICD-10-CM

## 2021-09-22 DIAGNOSIS — Z7902 Long term (current) use of antithrombotics/antiplatelets: Secondary | ICD-10-CM | POA: Diagnosis not present

## 2021-09-22 DIAGNOSIS — W01198A Fall on same level from slipping, tripping and stumbling with subsequent striking against other object, initial encounter: Secondary | ICD-10-CM | POA: Diagnosis not present

## 2021-09-22 DIAGNOSIS — S42221A 2-part displaced fracture of surgical neck of right humerus, initial encounter for closed fracture: Secondary | ICD-10-CM | POA: Diagnosis not present

## 2021-09-22 DIAGNOSIS — T148XXA Other injury of unspecified body region, initial encounter: Secondary | ICD-10-CM | POA: Diagnosis not present

## 2021-09-22 DIAGNOSIS — M25511 Pain in right shoulder: Secondary | ICD-10-CM | POA: Diagnosis not present

## 2021-09-22 MED ORDER — OXYCODONE-ACETAMINOPHEN 5-325 MG PO TABS: 1.0000 | ORAL_TABLET | Freq: Four times a day (QID) | ORAL | 0 refills | Status: DC | PRN

## 2021-09-22 MED ORDER — OXYCODONE-ACETAMINOPHEN 5-325 MG PO TABS
1.0000 | ORAL_TABLET | Freq: Once | ORAL | Status: AC
  Administered 2021-09-22: 1 via ORAL
  Filled 2021-09-22: qty 1

## 2021-09-22 NOTE — ED Provider Notes (Signed)
?[facility] ?Provider Note ? ? ?CSN: [id number] ?Arrival date & time: 09/22/21  2116 ? ?  ? ?History ? ?Chief Complaint  ?Patient presents with  ? Fall  ?  Pt report tripping anf fall on her right shoulder and right elbow. Reports pain and tenderness in those areas. Prior fx to right shoulder 08/27/21. Denies hitting head. Denies loc.   ? ? ?[name] is a 71 y.o. female who presents to the emergency department for right arm pain after a fall earlier this evening.  Patient states that she tripped and hit her right shoulder and right elbow on her gun cabinet.  She reports a prior history of fracture in the right shoulder after fall on 3/27.  She denies head trauma or loss of consciousness. Denies any other trauma.  ? ? ?Fall ? ? ?  ? ?Home Medications ?Prior to Admission medications   ?Medication Sig Start Date End Date Taking? Authorizing Provider  ?oxyCODONE-acetaminophen (PERCOCET/ROXICET) 5-325 MG tablet Take 1 tablet by mouth every 6 (six) hours as needed for severe pain. 09/22/21  Yes Akaisha Truman T, PA-C  ?acetaminophen (TYLENOL) 325 MG tablet Take 2 tablets (650 mg total) by mouth every 6 (six) hours as needed for mild pain (or Fever >/= 101). 08/30/21   Phillips Grout, MD  ?carvedilol (COREG) 6.25 MG tablet TAKE 1 TABLET(6.25 MG) BY MOUTH TWICE DAILY ?Patient taking differently: Take 6.25 mg by mouth 2 (two) times daily with a meal. TAKE 1 TABLET(6.25 MG) BY MOUTH TWICE DAILY 04/11/21   Josue Hector, MD  ?clopidogrel (PLAVIX) 75 MG tablet TAKE 1 TABLET BY MOUTH EVERY DAY ?Patient taking differently: Take 75 mg by mouth daily. 06/26/21   Josue Hector, MD  ?docusate sodium (COLACE) 100 MG capsule Take 100 mg by mouth 2 (two) times daily. ?Patient not taking: Reported on 08/27/2021    [provider]  ?gabapentin (NEURONTIN) 300 MG capsule Take 1 capsule (300 mg total) by mouth 2 (two) times daily. 01/17/20   Narda Amber K, DO  ?iron polysaccharides  (NIFEREX) 150 MG capsule Take 150 mg by mouth daily.    [provider]  ?letrozole (FEMARA) 2.5 MG tablet Take 2.5 mg by mouth daily. 08/05/21   [provider]  ?pantoprazole (PROTONIX) 20 MG tablet Take 20 mg by mouth daily. 12/21/19   [provider]  ?simvastatin (ZOCOR) 40 MG tablet TAKE 1 TABLET(40 MG) BY MOUTH DAILY AT 6 PM ?Patient taking differently: Take 40 mg by mouth daily at 6 PM. TAKE 1 TABLET(40 MG) BY MOUTH DAILY AT 6 PM 04/11/21   Josue Hector, MD  ?VITAMIN D, CHOLECALCIFEROL, PO Take 1 tablet by mouth 2 (two) times daily.    [provider]  ?   ? ?Allergies    ?Patient has no known allergies.   ? ?Review of Systems   ?Review of Systems  ?Musculoskeletal:   ?     Right arm pain  ?All other systems reviewed and are negative. ? ?Physical Exam ?Updated Vital Signs ?BP 123/67   Pulse 71   Temp 98.2 ?F (36.8 ?C) (Oral)   Resp 12   Ht '5\' 3"'$  (1.6 m)   Wt 43.1 kg   SpO2 93%   BMI 16.83 kg/m?  ?Physical Exam ?Vitals and nursing note reviewed.  ?Constitutional:   ?   Appearance: Normal appearance.  ?HENT:  ?   Head: Normocephalic and atraumatic.  ?Eyes:  ?  Conjunctiva/sclera: Conjunctivae normal.  ?Cardiovascular:  ?   Pulses:     ?     Radial pulses are 2+ on the right side and 2+ on the left side.  ?Pulmonary:  ?   Effort: Pulmonary effort is normal. No respiratory distress.  ?Musculoskeletal:  ?   Comments: Tenderness to palpation of the right humeral head. Normal strength of right elbow and wrist. Sensation in tact.   ?Skin: ?   General: Skin is warm and dry.  ?Neurological:  ?   Mental Status: She is alert.  ?Psychiatric:     ?   Mood and Affect: Mood normal.     ?   Behavior: Behavior normal.  ? ? ?ED Results / Procedures / Treatments   ?Labs ?(all labs ordered are listed, but only abnormal results are displayed) ?Labs Reviewed - No data to display ? ?EKG ?None ? ?Radiology ?DG Shoulder Right ? ?Result Date: 09/22/2021 ?CLINICAL DATA:  Fall, right shoulder  pain EXAM: RIGHT SHOULDER - 2+ VIEW COMPARISON:  None. FINDINGS: There is a right humeral neck fracture. Mild displacement. No subluxation or dislocation. Right Port-A-Cath in place with the tip at the cavoatrial junction. Visualized right lung clear. IMPRESSION: Mildly displaced right humeral neck fracture. Electronically Signed   By: Rolm Baptise M.D.   On: 09/22/2021 21:56  ? ?DG Elbow Complete Right ? ?Result Date: 09/22/2021 ?CLINICAL DATA:  Fall, syncope, right elbow pain EXAM: RIGHT ELBOW - COMPLETE 3+ VIEW COMPARISON:  None. FINDINGS: No acute bony abnormality. Specifically, no fracture, subluxation, or dislocation. No joint effusion. IMPRESSION: Negative. Electronically Signed   By: Rolm Baptise M.D.   On: 09/22/2021 21:57   ? ?Procedures ?Procedures  ? ? ?Medications Ordered in ED ?Medications  ?oxyCODONE-acetaminophen (PERCOCET/ROXICET) 5-325 MG per tablet 1 tablet (has no administration in time range)  ? ? ?ED Course/ Medical Decision Making/ A&P ?  ?                        ?Medical Decision Making ?Amount and/or Complexity of Data Reviewed ?Radiology: ordered. ? ?Risk ?Prescription drug management. ? ? ?This patient is a 71 year old female who presents to the ED for concern of right arm pain after fall.  ? ?Past Medical History / Co-morbidities: ?Hyperlipidemia, hypertension, CAD, CHF, AAA, breast cancer, right humerus fracture 3/27 ? ?Physical Exam: ?Physical exam performed. The pertinent findings include: Tenderness to palpation of the right humeral head. Normal strength of right elbow and wrist. Sensation in tact.  ? ?Lab Tests/Imaging studies: ?I Ordered, and personally interpreted labs/imaging including right shoulder and right elbow x-rays.  The pertinent results include: Mildly displaced right humeral neck fracture. I agree with the radiologist interpretation. ?  ?Medications: ?I ordered medication including analgesia  for pain.  I have reviewed the patients home medicines and have made  adjustments as needed. ?  ?Disposition: ?After consideration of the diagnostic results and the patients response to treatment, I feel that patient's not requiring admission or inpatient treatment for symptoms.  Placed in splint and pain medication given.  I sent a message to patient's orthopedist Dr. Blayden [facility]well [facility] to schedule follow up. discussed reasons to return to the emergency department, and the patient is agreeable to the plan.  ? ?I discussed this case with my attending physician Dr. Vanita Panda who cosigned this note including patient's presenting symptoms, physical exam, and planned diagnostics and interventions. Attending physician stated agreement with plan or made changes to plan which were implemented.   ? ?  Final Clinical Impression(s) / ED Diagnoses ?Final diagnoses:  ?Fall, initial encounter  ?Closed 2-part displaced fracture of surgical neck of right humerus, initial encounter  ? ? ?Rx / DC Orders ?ED Discharge Orders   ? ?      Ordered  ?  oxyCODONE-acetaminophen (PERCOCET/ROXICET) 5-325 MG tablet  Every 6 hours PRN       ? 09/22/21 2232  ? ?  ?  ? ?  ? ?Portions of this report may have been transcribed using voice recognition software. Every effort was made to ensure accuracy; however, inadvertent computerized transcription errors may be present. ? ?  ?Kateri Plummer, PA-C ?09/22/21 2317 ? ?  ?Carmin Muskrat, MD ?09/23/21 2321 ? ?

## 2021-09-22 NOTE — Discharge Instructions (Addendum)
You were seen in the emergency department today after fall. ? ?As we discussed you broke your right humerus again.  We have placed this in a sling.  Also given you some pain medication, and sent you prescription for additional pain medication.  If you need any medicine throughout the night, I recommend ibuprofen and Tylenol. ? ?I have also sent a message to Dr. Elsbeth Yearick [facility] to hopefully get you in the clinic on Monday for follow-up. ? ?Continue to monitor how you're doing and return to the ER for new or worsening symptoms.  ?

## 2021-09-22 NOTE — ED Notes (Signed)
Ortho paged. Called return to this RN. ?

## 2021-09-23 NOTE — Progress Notes (Signed)
Orthopedic Tech Progress Note ?Patient Details:  ?JACCI RUBERG ?1951/03/30 ?[id number] ? ?Ortho Devices ?Type of Ortho Device: Sling immobilizer ?Ortho Device/Splint Location: rue ?Ortho Device/Splint Interventions: Ordered, Application, Adjustment ?  ?Post Interventions ?Patient Tolerated: Well ?Instructions Provided: Care of device, Adjustment of device ? ?Karolee Stamps ?09/23/2021, 12:39 AM ? ?

## 2021-09-25 DIAGNOSIS — Z9181 History of falling: Secondary | ICD-10-CM | POA: Diagnosis not present

## 2021-09-25 DIAGNOSIS — E876 Hypokalemia: Secondary | ICD-10-CM | POA: Diagnosis not present

## 2021-09-25 DIAGNOSIS — S42201A Unspecified fracture of upper end of right humerus, initial encounter for closed fracture: Secondary | ICD-10-CM | POA: Diagnosis not present

## 2021-09-25 DIAGNOSIS — D509 Iron deficiency anemia, unspecified: Secondary | ICD-10-CM | POA: Diagnosis not present

## 2021-09-25 DIAGNOSIS — I951 Orthostatic hypotension: Secondary | ICD-10-CM | POA: Diagnosis not present

## 2021-09-25 DIAGNOSIS — R269 Unspecified abnormalities of gait and mobility: Secondary | ICD-10-CM | POA: Diagnosis not present

## 2021-09-25 DIAGNOSIS — C50919 Malignant neoplasm of unspecified site of unspecified female breast: Secondary | ICD-10-CM | POA: Diagnosis not present

## 2021-09-25 DIAGNOSIS — E785 Hyperlipidemia, unspecified: Secondary | ICD-10-CM | POA: Diagnosis not present

## 2021-09-25 DIAGNOSIS — S42201D Unspecified fracture of upper end of right humerus, subsequent encounter for fracture with routine healing: Secondary | ICD-10-CM | POA: Diagnosis not present

## 2021-09-25 DIAGNOSIS — Z7902 Long term (current) use of antithrombotics/antiplatelets: Secondary | ICD-10-CM | POA: Diagnosis not present

## 2021-09-25 DIAGNOSIS — I110 Hypertensive heart disease with heart failure: Secondary | ICD-10-CM | POA: Diagnosis not present

## 2021-09-25 DIAGNOSIS — I5032 Chronic diastolic (congestive) heart failure: Secondary | ICD-10-CM | POA: Diagnosis not present

## 2021-09-25 DIAGNOSIS — E871 Hypo-osmolality and hyponatremia: Secondary | ICD-10-CM | POA: Diagnosis not present

## 2021-09-25 DIAGNOSIS — K219 Gastro-esophageal reflux disease without esophagitis: Secondary | ICD-10-CM | POA: Diagnosis not present

## 2021-09-26 ENCOUNTER — Ambulatory Visit: Payer: PPO | Admitting: Orthopaedic Surgery

## 2021-09-26 ENCOUNTER — Encounter: Payer: Self-pay | Admitting: Orthopaedic Surgery

## 2021-09-26 VITALS — BP 84/52 | HR 81 | Ht 63.0 in | Wt 96.0 lb

## 2021-09-26 DIAGNOSIS — S42201D Unspecified fracture of upper end of right humerus, subsequent encounter for fracture with routine healing: Secondary | ICD-10-CM

## 2021-09-26 DIAGNOSIS — R269 Unspecified abnormalities of gait and mobility: Secondary | ICD-10-CM | POA: Diagnosis not present

## 2021-09-26 DIAGNOSIS — I110 Hypertensive heart disease with heart failure: Secondary | ICD-10-CM | POA: Diagnosis not present

## 2021-09-26 DIAGNOSIS — Z7902 Long term (current) use of antithrombotics/antiplatelets: Secondary | ICD-10-CM | POA: Diagnosis not present

## 2021-09-26 DIAGNOSIS — I951 Orthostatic hypotension: Secondary | ICD-10-CM | POA: Diagnosis not present

## 2021-09-26 DIAGNOSIS — K219 Gastro-esophageal reflux disease without esophagitis: Secondary | ICD-10-CM | POA: Diagnosis not present

## 2021-09-26 DIAGNOSIS — I5032 Chronic diastolic (congestive) heart failure: Secondary | ICD-10-CM | POA: Diagnosis not present

## 2021-09-26 DIAGNOSIS — E871 Hypo-osmolality and hyponatremia: Secondary | ICD-10-CM | POA: Diagnosis not present

## 2021-09-26 DIAGNOSIS — E876 Hypokalemia: Secondary | ICD-10-CM | POA: Diagnosis not present

## 2021-09-26 DIAGNOSIS — C50919 Malignant neoplasm of unspecified site of unspecified female breast: Secondary | ICD-10-CM | POA: Diagnosis not present

## 2021-09-26 DIAGNOSIS — E785 Hyperlipidemia, unspecified: Secondary | ICD-10-CM | POA: Diagnosis not present

## 2021-09-26 DIAGNOSIS — Z9181 History of falling: Secondary | ICD-10-CM | POA: Diagnosis not present

## 2021-09-26 DIAGNOSIS — S42201A Unspecified fracture of upper end of right humerus, initial encounter for closed fracture: Secondary | ICD-10-CM | POA: Diagnosis not present

## 2021-09-26 DIAGNOSIS — D509 Iron deficiency anemia, unspecified: Secondary | ICD-10-CM | POA: Diagnosis not present

## 2021-09-26 NOTE — Progress Notes (Signed)
? ?Office Visit Note ?  ?Patient: [name]           ?Date of Birth: 09-18-1950           ?MRN: [id number] ?Visit Date: 09/26/2021 ?             ?Requested by: Reynold Bowen, MD ?[address] ?[place],  [place] [address] ?PCP: Reynold Bowen, MD ? ? ?Assessment & Plan: ?Visit Diagnoses: No diagnosis found. ? ?Plan: Continue sling continue cane fall prevention discussed.  She is in physical therapy for lower extremity strengthening and fall prevention.  Recheck 3 weeks single AP x-ray of her shoulder on return. ? ?Follow-Up Instructions: No follow-ups on file.  ? ?Orders:  ?No orders of the defined types were placed in this encounter. ? ?No orders of the defined types were placed in this encounter. ? ? ? ? Procedures: ?No procedures performed ? ? ?Clinical Data: ?No additional findings. ? ? ?Subjective: ?Chief Complaint  ?Patient presents with  ? Right Shoulder - Pain  ? Right Elbow - Pain  ? ? ?HPI 71 year old female fell 4 days ago on her healing proximal humerus fracture with refracture with about 1 to 2 mm more displacement of the comminuted fracture.  Radiologist reviewed it but did not look at the comparison films.  She still remains in soft alignment.  She has had 12 falls over the last year has peripheral neuropathy and is seeing 3 different neurologist.  No cause for her neuropathy involving upper and lower extremities has been found she relates.  Oxycodone makes her sick she is using Advil and Tylenol.  She has not had any more bruising but states this hurts worse than her original fracture on 08/27/2021.  Patient is using a cane.  She states she is trying to get in bed spine around a little too quick lost her balance and fell. ? ?Review of Systems updated unchanged ? ? ?Objective: ?Vital Signs: BP (!) 84/52   Pulse 81   Ht '5\' 3"'$  (1.6 m)   Wt 96 lb (43.5 kg)   BMI 17.01 kg/m?  ? ?Physical Exam ?Constitutional:   ?   Appearance: She is well-developed.  ?   Comments: Thin  ?HENT:  ?   Head:  Normocephalic.  ?   Right Ear: External ear normal.  ?   Left Ear: External ear normal. There is no impacted cerumen.  ?Eyes:  ?   Pupils: Pupils are equal, round, and reactive to light.  ?Neck:  ?   Thyroid: No thyromegaly.  ?   Trachea: No tracheal deviation.  ?Cardiovascular:  ?   Rate and Rhythm: Normal rate.  ?Pulmonary:  ?   Effort: Pulmonary effort is normal.  ?Abdominal:  ?   Palpations: Abdomen is soft.  ?Musculoskeletal:  ?   Cervical back: No rigidity.  ?Skin: ?   General: Skin is warm and dry.  ?Neurological:  ?   Mental Status: She is alert and oriented to person, place, and time.  ?Psychiatric:     ?   Behavior: Behavior normal.  ? ? ?Ortho Exam ?Ecchymosis right upper extremity patient is in a sling.Sensation of the deltoid is intact axillary nerve.  Good flexion extension of the fingertips. ? ?Specialty Comments:  ?No specialty comments available. ? ?Imaging: ?Narrative & Impression  ?CLINICAL DATA:  Fall, right shoulder pain ?  ?EXAM: ?RIGHT SHOULDER - 2+ VIEW ?  ?COMPARISON:  None. ?  ?FINDINGS: ?There is a right humeral neck fracture. Mild  displacement. No ?subluxation or dislocation. ?  ?Right Port-A-Cath in place with the tip at the cavoatrial junction. ?Visualized right lung clear. ?  ?IMPRESSION: ?Mildly displaced right humeral neck fracture. ?  ?  ?Electronically Signed ?  By: Rolm Baptise M.D. ?  On: 09/22/2021 21:56  ? ? ? ?PMFS History: ?Patient Active Problem List  ? Diagnosis Date Noted  ? AKI (acute kidney injury) ([place]) 08/28/2021  ? Hyponatremia 08/28/2021  ? Hypomagnesemia 08/28/2021  ? Hypokalemia 08/28/2021  ? Closed fracture of proximal end of right humerus 08/28/2021  ? Generalized weakness 08/28/2021  ? Hypotension 08/28/2021  ? Chronic diastolic CHF (congestive heart failure) ([place]) 08/28/2021  ? GERD (gastroesophageal reflux disease) 08/28/2021  ? Syncope 08/27/2021  ? Peripheral arterial disease ([place]) 04/26/2019  ? Stenosis of left subclavian artery ([place]) 03/12/2019  ?  Paresthesia 07/13/2018  ? Gait abnormality 07/13/2018  ? Acute on chronic anemia 01/16/2016  ? Left carotid bruit 01/16/2016  ? History of colonic polyps 04/20/2014  ? Chronic anticoagulation 04/20/2014  ? Multiple adenomatous polyps 01/24/2014  ? Lower GI bleed 01/05/2014  ? Post-polypectomy bleeding 01/05/2014  ? NONSPECIFIC ABN FINDNG RAD&OTH EXAM BILARY TRCT 11/09/2008  ? Nonspecific (abnormal) findings on radiological and other examination of body structure 11/01/2008  ? CT, CHEST, ABNORMAL 11/01/2008  ? ABDOMINAL AORTIC ANEURYSM 10/26/2008  ? HLD (hyperlipidemia) 10/25/2008  ? TOBACCO ABUSE 10/25/2008  ? Essential hypertension 10/25/2008  ? Coronary atherosclerosis 10/25/2008  ? ISCHEMIC CARDIOMYOPATHY 10/25/2008  ? Congestive heart failure ([place]) 10/25/2008  ? ?Past Medical History:  ?Diagnosis Date  ? ABDOMINAL AORTIC ANEURYSM   ? Anemia   ? Arthritis   ? HANDS  ? Blood transfusion without reported diagnosis   ? AUGUST 5TH 2017 x2   ? Breast cancer ([place])   ? CAD   ? CONGESTIVE HEART FAILURE, SYSTOLIC, CHRONIC   ? CT, CHEST, ABNORMAL   ? GERD (gastroesophageal reflux disease)   ? Heart attack ([place])   ? x 2  ? History of colon polyps 01/05/2014  ? adenomatous polyps  ? HYPERLIPIDEMIA   ? HYPERTENSION   ? ISCHEMIC CARDIOMYOPATHY   ? Lower GI bleed 01/2014  ? NONSPECIFIC ABN FINDNG RAD&OTH EXAM BILARY TRCT   ? Numbness and tingling   ? 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 Paresthesia of both hands   ? TOBACCO ABUSE   ?  ?Family History  ?Problem Relation Age of Onset  ? Lymphoma Mother 21  ? Colon polyps Father   ? Depression Father 51  ?     died of suicide  ? Heart attack Father   ? Diabetes Maternal Grandmother   ? Anemia Sister   ? Colon polyps Sister   ? Lung cancer Sister   ? Colon cancer Neg Hx   ? Esophageal cancer Neg Hx   ? Rectal cancer Neg Hx   ? Stomach cancer Neg Hx   ?  ?Past Surgical History:  ?Procedure Laterality Date  ? bare metal stent placement    ? left anterior descending  artery x 2  ? COLONOSCOPY N/A 01/05/2014  ? Procedure: COLONOSCOPY;  Surgeon: Jerene Bears, MD;  Location: [facility];  Service: Endoscopy;  Laterality: N/A;  ? COLONOSCOPY N/A 01/06/2014  ? Procedure: COLONOSCOPY;  Surgeon: Jerene Bears, MD;  Location: [facility];  Service: Endoscopy;  Laterality: N/A;  ? COLONOSCOPY    ? POLYPECTOMY    ? ?Social History  ? ?Occupational History  ? Occupation:  retired  ?Tobacco Use  ? Smoking status: Former  ?  Types: Cigarettes, E-cigarettes  ?  Quit date: 2013  ?  Years since quitting: 10.3  ?  Passive exposure: Never  ? Smokeless tobacco: Never  ?Vaping Use  ? Vaping Use: Some days  ?Substance and Sexual Activity  ? Alcohol use: Not Currently  ? Drug use: No  ? Sexual activity: Not on file  ? ? ? ? ? ? ?

## 2021-09-27 DIAGNOSIS — S42201A Unspecified fracture of upper end of right humerus, initial encounter for closed fracture: Secondary | ICD-10-CM | POA: Diagnosis not present

## 2021-09-27 DIAGNOSIS — E785 Hyperlipidemia, unspecified: Secondary | ICD-10-CM | POA: Diagnosis not present

## 2021-09-27 DIAGNOSIS — K219 Gastro-esophageal reflux disease without esophagitis: Secondary | ICD-10-CM | POA: Diagnosis not present

## 2021-09-27 DIAGNOSIS — R269 Unspecified abnormalities of gait and mobility: Secondary | ICD-10-CM | POA: Diagnosis not present

## 2021-09-27 DIAGNOSIS — Z7902 Long term (current) use of antithrombotics/antiplatelets: Secondary | ICD-10-CM | POA: Diagnosis not present

## 2021-09-27 DIAGNOSIS — D509 Iron deficiency anemia, unspecified: Secondary | ICD-10-CM | POA: Diagnosis not present

## 2021-09-27 DIAGNOSIS — Z9181 History of falling: Secondary | ICD-10-CM | POA: Diagnosis not present

## 2021-09-27 DIAGNOSIS — I110 Hypertensive heart disease with heart failure: Secondary | ICD-10-CM | POA: Diagnosis not present

## 2021-09-27 DIAGNOSIS — C50919 Malignant neoplasm of unspecified site of unspecified female breast: Secondary | ICD-10-CM | POA: Diagnosis not present

## 2021-09-27 DIAGNOSIS — I5032 Chronic diastolic (congestive) heart failure: Secondary | ICD-10-CM | POA: Diagnosis not present

## 2021-09-27 DIAGNOSIS — E871 Hypo-osmolality and hyponatremia: Secondary | ICD-10-CM | POA: Diagnosis not present

## 2021-09-27 DIAGNOSIS — S42201D Unspecified fracture of upper end of right humerus, subsequent encounter for fracture with routine healing: Secondary | ICD-10-CM | POA: Diagnosis not present

## 2021-09-27 DIAGNOSIS — E876 Hypokalemia: Secondary | ICD-10-CM | POA: Diagnosis not present

## 2021-09-27 DIAGNOSIS — I951 Orthostatic hypotension: Secondary | ICD-10-CM | POA: Diagnosis not present

## 2021-10-02 ENCOUNTER — Emergency Department (HOSPITAL_BASED_OUTPATIENT_CLINIC_OR_DEPARTMENT_OTHER): Payer: PPO

## 2021-10-02 ENCOUNTER — Ambulatory Visit: Payer: PPO | Admitting: Orthopaedic Surgery

## 2021-10-02 ENCOUNTER — Encounter (HOSPITAL_BASED_OUTPATIENT_CLINIC_OR_DEPARTMENT_OTHER): Payer: Self-pay

## 2021-10-02 ENCOUNTER — Inpatient Hospital Stay (HOSPITAL_BASED_OUTPATIENT_CLINIC_OR_DEPARTMENT_OTHER)
Admission: EM | Admit: 2021-10-02 | Discharge: 2021-10-05 | DRG: 683 | Disposition: A | Discharge status: Home Health | Payer: PPO | Attending: Family Medicine | Admitting: Family Medicine

## 2021-10-02 ENCOUNTER — Other Ambulatory Visit: Payer: Self-pay

## 2021-10-02 DIAGNOSIS — N1330 Unspecified hydronephrosis: Secondary | ICD-10-CM | POA: Diagnosis not present

## 2021-10-02 DIAGNOSIS — Z87891 Personal history of nicotine dependence: Secondary | ICD-10-CM

## 2021-10-02 DIAGNOSIS — K521 Toxic gastroenteritis and colitis: Secondary | ICD-10-CM | POA: Diagnosis not present

## 2021-10-02 DIAGNOSIS — D509 Iron deficiency anemia, unspecified: Secondary | ICD-10-CM | POA: Diagnosis not present

## 2021-10-02 DIAGNOSIS — N179 Acute kidney failure, unspecified: Principal | ICD-10-CM | POA: Diagnosis present

## 2021-10-02 DIAGNOSIS — Z853 Personal history of malignant neoplasm of breast: Secondary | ICD-10-CM

## 2021-10-02 DIAGNOSIS — E8721 Acute metabolic acidosis: Secondary | ICD-10-CM | POA: Diagnosis present

## 2021-10-02 DIAGNOSIS — G629 Polyneuropathy, unspecified: Secondary | ICD-10-CM | POA: Diagnosis not present

## 2021-10-02 DIAGNOSIS — R296 Repeated falls: Secondary | ICD-10-CM

## 2021-10-02 DIAGNOSIS — M19041 Primary osteoarthritis, right hand: Secondary | ICD-10-CM | POA: Diagnosis present

## 2021-10-02 DIAGNOSIS — Z8371 Family history of colonic polyps: Secondary | ICD-10-CM

## 2021-10-02 DIAGNOSIS — I110 Hypertensive heart disease with heart failure: Secondary | ICD-10-CM | POA: Diagnosis not present

## 2021-10-02 DIAGNOSIS — Z7902 Long term (current) use of antithrombotics/antiplatelets: Secondary | ICD-10-CM | POA: Diagnosis not present

## 2021-10-02 DIAGNOSIS — I252 Old myocardial infarction: Secondary | ICD-10-CM

## 2021-10-02 DIAGNOSIS — Z8249 Family history of ischemic heart disease and other diseases of the circulatory system: Secondary | ICD-10-CM | POA: Diagnosis not present

## 2021-10-02 DIAGNOSIS — R269 Unspecified abnormalities of gait and mobility: Secondary | ICD-10-CM | POA: Diagnosis not present

## 2021-10-02 DIAGNOSIS — I951 Orthostatic hypotension: Secondary | ICD-10-CM | POA: Diagnosis present

## 2021-10-02 DIAGNOSIS — Y92009 Unspecified place in unspecified non-institutional (private) residence as the place of occurrence of the external cause: Secondary | ICD-10-CM

## 2021-10-02 DIAGNOSIS — K828 Other specified diseases of gallbladder: Secondary | ICD-10-CM | POA: Diagnosis not present

## 2021-10-02 DIAGNOSIS — T451X5A Adverse effect of antineoplastic and immunosuppressive drugs, initial encounter: Secondary | ICD-10-CM | POA: Diagnosis present

## 2021-10-02 DIAGNOSIS — R42 Dizziness and giddiness: Secondary | ICD-10-CM | POA: Diagnosis present

## 2021-10-02 DIAGNOSIS — Z9181 History of falling: Secondary | ICD-10-CM | POA: Diagnosis not present

## 2021-10-02 DIAGNOSIS — K219 Gastro-esophageal reflux disease without esophagitis: Secondary | ICD-10-CM | POA: Diagnosis present

## 2021-10-02 DIAGNOSIS — Z79899 Other long term (current) drug therapy: Secondary | ICD-10-CM

## 2021-10-02 DIAGNOSIS — I5042 Chronic combined systolic (congestive) and diastolic (congestive) heart failure: Secondary | ICD-10-CM | POA: Diagnosis present

## 2021-10-02 DIAGNOSIS — R9431 Abnormal electrocardiogram [ECG] [EKG]: Secondary | ICD-10-CM | POA: Diagnosis not present

## 2021-10-02 DIAGNOSIS — K529 Noninfective gastroenteritis and colitis, unspecified: Secondary | ICD-10-CM

## 2021-10-02 DIAGNOSIS — I10 Essential (primary) hypertension: Secondary | ICD-10-CM

## 2021-10-02 DIAGNOSIS — E785 Hyperlipidemia, unspecified: Secondary | ICD-10-CM | POA: Diagnosis present

## 2021-10-02 DIAGNOSIS — I714 Abdominal aortic aneurysm, without rupture, unspecified: Secondary | ICD-10-CM

## 2021-10-02 DIAGNOSIS — W010XXA Fall on same level from slipping, tripping and stumbling without subsequent striking against object, initial encounter: Secondary | ICD-10-CM | POA: Diagnosis present

## 2021-10-02 DIAGNOSIS — I739 Peripheral vascular disease, unspecified: Secondary | ICD-10-CM | POA: Diagnosis not present

## 2021-10-02 DIAGNOSIS — R531 Weakness: Secondary | ICD-10-CM | POA: Diagnosis not present

## 2021-10-02 DIAGNOSIS — Z86010 Personal history of colonic polyps: Secondary | ICD-10-CM

## 2021-10-02 DIAGNOSIS — I5032 Chronic diastolic (congestive) heart failure: Secondary | ICD-10-CM | POA: Diagnosis not present

## 2021-10-02 DIAGNOSIS — E871 Hypo-osmolality and hyponatremia: Secondary | ICD-10-CM | POA: Diagnosis present

## 2021-10-02 DIAGNOSIS — I7143 Infrarenal abdominal aortic aneurysm, without rupture: Secondary | ICD-10-CM | POA: Diagnosis not present

## 2021-10-02 DIAGNOSIS — E860 Dehydration: Secondary | ICD-10-CM

## 2021-10-02 DIAGNOSIS — C50919 Malignant neoplasm of unspecified site of unspecified female breast: Secondary | ICD-10-CM | POA: Diagnosis not present

## 2021-10-02 DIAGNOSIS — K6389 Other specified diseases of intestine: Secondary | ICD-10-CM | POA: Diagnosis not present

## 2021-10-02 DIAGNOSIS — S42201A Unspecified fracture of upper end of right humerus, initial encounter for closed fracture: Secondary | ICD-10-CM | POA: Diagnosis not present

## 2021-10-02 DIAGNOSIS — I255 Ischemic cardiomyopathy: Secondary | ICD-10-CM | POA: Diagnosis not present

## 2021-10-02 DIAGNOSIS — Z8719 Personal history of other diseases of the digestive system: Secondary | ICD-10-CM

## 2021-10-02 DIAGNOSIS — I2510 Atherosclerotic heart disease of native coronary artery without angina pectoris: Secondary | ICD-10-CM | POA: Diagnosis present

## 2021-10-02 DIAGNOSIS — S42201D Unspecified fracture of upper end of right humerus, subsequent encounter for fracture with routine healing: Secondary | ICD-10-CM | POA: Diagnosis not present

## 2021-10-02 DIAGNOSIS — E876 Hypokalemia: Secondary | ICD-10-CM | POA: Diagnosis not present

## 2021-10-02 DIAGNOSIS — M19042 Primary osteoarthritis, left hand: Secondary | ICD-10-CM | POA: Diagnosis present

## 2021-10-02 LAB — CBG MONITORING, ED: Glucose-Capillary: 158 mg/dL — ABNORMAL HIGH (ref 70–99)

## 2021-10-02 LAB — I-STAT ARTERIAL BLOOD GAS, ED
Acid-base deficit: 7 mmol/L — ABNORMAL HIGH (ref 0.0–2.0)
Bicarbonate: 18.8 mmol/L — ABNORMAL LOW (ref 20.0–28.0)
Calcium, Ion: 1.2 mmol/L (ref 1.15–1.40)
HCT: 27 % — ABNORMAL LOW (ref 36.0–46.0)
Hemoglobin: 9.2 g/dL — ABNORMAL LOW (ref 12.0–15.0)
O2 Saturation: 95 %
Potassium: 2.9 mmol/L — ABNORMAL LOW (ref 3.5–5.1)
Sodium: 131 mmol/L — ABNORMAL LOW (ref 135–145)
TCO2: 20 mmol/L — ABNORMAL LOW (ref 22–32)
pCO2 arterial: 38.4 mmHg (ref 32–48)
pH, Arterial: 7.299 — ABNORMAL LOW (ref 7.35–7.45)
pO2, Arterial: 82 mmHg — ABNORMAL LOW (ref 83–108)

## 2021-10-02 LAB — OCCULT BLOOD X 1 CARD TO LAB, STOOL: Fecal Occult Bld: NEGATIVE

## 2021-10-02 LAB — BASIC METABOLIC PANEL
Anion gap: 20 — ABNORMAL HIGH (ref 5–15)
BUN: 66 mg/dL — ABNORMAL HIGH (ref 8–23)
CO2: 15 mmol/L — ABNORMAL LOW (ref 22–32)
Calcium: 10 mg/dL (ref 8.9–10.3)
Chloride: 92 mmol/L — ABNORMAL LOW (ref 98–111)
Creatinine, Ser: 4.08 mg/dL — ABNORMAL HIGH (ref 0.44–1.00)
GFR, Estimated: 11 mL/min — ABNORMAL LOW (ref >60)
Glucose, Bld: 161 mg/dL — ABNORMAL HIGH (ref 70–99)
Potassium: 3.7 mmol/L (ref 3.5–5.1)
Sodium: 127 mmol/L — ABNORMAL LOW (ref 135–145)

## 2021-10-02 LAB — CBC
HCT: 35.1 % — ABNORMAL LOW (ref 36.0–46.0)
Hemoglobin: 11.5 g/dL — ABNORMAL LOW (ref 12.0–15.0)
MCH: 32 pg (ref 26.0–34.0)
MCHC: 32.8 g/dL (ref 30.0–36.0)
MCV: 97.8 fL (ref 80.0–100.0)
Platelets: 225 10*3/uL (ref 150–400)
RBC: 3.59 MIL/uL — ABNORMAL LOW (ref 3.87–5.11)
RDW: 15.2 % (ref 11.5–15.5)
WBC: 9.9 10*3/uL (ref 4.0–10.5)
nRBC: 0 % (ref 0.0–0.2)

## 2021-10-02 LAB — LACTIC ACID, PLASMA: Lactic Acid, Venous: 0.9 mmol/L (ref 0.5–1.9)

## 2021-10-02 MED ORDER — LOPERAMIDE HCL 2 MG PO CAPS
2.0000 mg | ORAL_CAPSULE | Freq: Once | ORAL | Status: AC
Start: 2021-10-02 — End: 2021-10-02
  Administered 2021-10-02: 2 mg via ORAL
  Filled 2021-10-02: qty 1

## 2021-10-02 MED ORDER — SODIUM CHLORIDE 0.9 % IV BOLUS
1000.0000 mL | Freq: Once | INTRAVENOUS | Status: AC
  Administered 2021-10-02: 1000 mL via INTRAVENOUS

## 2021-10-02 MED ORDER — SODIUM CHLORIDE 0.9 % IV SOLN: INTRAVENOUS | Status: DC

## 2021-10-02 MED ORDER — FENTANYL CITRATE PF 50 MCG/ML IJ SOSY
25.0000 ug | PREFILLED_SYRINGE | Freq: Once | INTRAMUSCULAR | Status: AC
  Administered 2021-10-03: 25 ug via INTRAVENOUS
  Filled 2021-10-02: qty 1

## 2021-10-02 MED ORDER — ACETAMINOPHEN 325 MG PO TABS
650.0000 mg | ORAL_TABLET | Freq: Once | ORAL | Status: AC
  Administered 2021-10-02: 650 mg via ORAL
  Filled 2021-10-02: qty 2

## 2021-10-02 NOTE — ED Notes (Signed)
Pt is a hard stick and was only able to collect one set of blood cultures // RN made aware ?

## 2021-10-02 NOTE — Progress Notes (Signed)
  [facility] will assume care on arrival to accepting facility. Until arrival, care as per EDP. However, [facility] available 24/7 for questions and assistance.   Nursing staff please page [facility] Admits and Consults ([phone]) as soon as the patient arrives to the hospital.  Anay Rathe, DO [facility]  

## 2021-10-02 NOTE — ED Notes (Signed)
Attempted to insert foley cath x2 but no urine output noted.  ? ?Bladder scanned pt but didn't show volume. RN made aware of attempt   ?

## 2021-10-02 NOTE — ED Notes (Signed)
Bladder scan noted 0 ML ?Patient had several liquid stools today  ?

## 2021-10-02 NOTE — ED Triage Notes (Signed)
Patient here POV from Home. ? ?Endorses Weakness and Dizziness associated with Hypotension at Home for approximately 2-3 days.  ? ?Patient felt dizzy this AM and fell approximately at 0900. No New Injuries related to Falls. ? ?More recent History of Orthostatic Hypotension and Falls related to Weakness. ? ?NAD Noted during Triage. A&Ox4. GCS 15. BIB Wheelchair. ?

## 2021-10-02 NOTE — ED Provider Notes (Signed)
?[facility] ?Provider Note ? ? ?CSN: [id number] ?Arrival date & time: 10/02/21  1801 ? ?  ? ?History ? ?Chief Complaint  ?Patient presents with  ? Fall  ? ? ?[name] is a 71 y.o. female. ? ?[name] is a 71 y.o. female with medical history significant for breast cancer status postchemotherapy, chronic iron deficiency anemia associated baseline hemoglobin 27-03, chronic diastolic heart failure, hypertension, hyperlipidemia, chronic hyponatremia with baseline serum sodium 130-134 presenting with dizziness and weakness for the past 3 days with multiple falls.  States she fell last week and injured her arm.  She fell again today.  States she feels dizzy and lightheaded when she stands up and today lost her balance falling backwards against her back.  Did not hit her head or lose consciousness.  Has not had her blood pressure medications for 2 to 3 days as well as her Plavix due to low blood pressure.  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  Denies any black or bloody stools.  Denies any vomiting or fever.  Denies any chest pain or shortness of breath. ?Denies any injury from her fall today.  States she has been drinking but not eating very much. ?Was admitted for loss of consciousness episode in March. ? ?The history is provided by the patient and a relative.  ?Fall ?Pertinent negatives include no abdominal pain, no headaches and no shortness of breath.  ? ?  ? ?Home Medications ?Prior to Admission medications   ?Medication Sig Start Date End Date Taking? Authorizing Provider  ?acetaminophen (TYLENOL) 325 MG tablet Take 2 tablets (650 mg total) by mouth every 6 (six) hours as needed for mild pain (or Fever >/= 101). 08/30/21   Phillips Grout, MD  ?carvedilol (COREG) 6.25 MG tablet TAKE 1 TABLET(6.25 MG) BY MOUTH TWICE DAILY ?Patient taking differently: Take 6.25 mg by mouth 2 (two) times daily with a meal. TAKE 1 TABLET(6.25 MG) BY MOUTH TWICE  DAILY 04/11/21   Josue Hector, MD  ?clopidogrel (PLAVIX) 75 MG tablet TAKE 1 TABLET BY MOUTH EVERY DAY ?Patient taking differently: Take 75 mg by mouth daily. 06/26/21   Josue Hector, MD  ?docusate sodium (COLACE) 100 MG capsule Take 100 mg by mouth 2 (two) times daily. ?Patient not taking: Reported on 08/27/2021    [provider]  ?gabapentin (NEURONTIN) 300 MG capsule Take 1 capsule (300 mg total) by mouth 2 (two) times daily. 01/17/20   Narda Amber K, DO  ?iron polysaccharides (NIFEREX) 150 MG capsule Take 150 mg by mouth daily.    [provider]  ?letrozole (FEMARA) 2.5 MG tablet Take 2.5 mg by mouth daily. 08/05/21   [provider]  ?oxyCODONE-acetaminophen (PERCOCET/ROXICET) 5-325 MG tablet Take 1 tablet by mouth every 6 (six) hours as needed for severe pain. 09/22/21   Roemhildt, Lorin T, PA-C  ?pantoprazole (PROTONIX) 20 MG tablet Take 20 mg by mouth daily. 12/21/19   [provider]  ?simvastatin (ZOCOR) 40 MG tablet TAKE 1 TABLET(40 MG) BY MOUTH DAILY AT 6 PM ?Patient taking differently: Take 40 mg by mouth daily at 6 PM. TAKE 1 TABLET(40 MG) BY MOUTH DAILY AT 6 PM 04/11/21   Josue Hector, MD  ?VITAMIN D, CHOLECALCIFEROL, PO Take 1 tablet by mouth 2 (two) times daily.    [provider]  ?   ? ?Allergies    ?Patient has no known allergies.   ? ?Review of  Systems   ?Review of Systems  ?Constitutional:  Positive for activity change, appetite change and fatigue.  ?HENT:  Negative for congestion and rhinorrhea.   ?Respiratory:  Negative for cough, chest tightness and shortness of breath.   ?Gastrointestinal:  Positive for diarrhea. Negative for abdominal pain, nausea and vomiting.  ?Genitourinary:  Negative for dysuria and hematuria.  ?Musculoskeletal:  Negative for arthralgias and myalgias.  ?Skin:  Negative for rash.  ?Neurological:  Positive for dizziness, weakness and light-headedness. Negative for headaches.  ? all other systems are negative except as  noted in the HPI and PMH.  ? ?Physical Exam ?Updated Vital Signs ?BP (!) 82/59 (BP Location: Left Arm)   Pulse (!) 114   Temp 97.8 ?F (36.6 ?C)   Resp 16   Ht '5\' 3"'$  (1.6 m)   Wt 43.5 kg   SpO2 99%   BMI 16.99 kg/m?  ?Physical Exam ?Vitals and nursing note reviewed.  ?Constitutional:   ?   General: She is not in acute distress. ?   Appearance: She is well-developed.  ?   Comments: Chronically ill appearing  ?HENT:  ?   Head: Normocephalic and atraumatic.  ?   Mouth/Throat:  ?   Pharynx: No oropharyngeal exudate.  ?Eyes:  ?   Conjunctiva/sclera: Conjunctivae normal.  ?   Pupils: Pupils are equal, round, and reactive to light.  ?Neck:  ?   Comments: No meningismus. ?Cardiovascular:  ?   Rate and Rhythm: Regular rhythm. Tachycardia present.  ?   Heart sounds: Normal heart sounds. No murmur heard. ?Pulmonary:  ?   Effort: Pulmonary effort is normal. No respiratory distress.  ?   Breath sounds: Normal breath sounds.  ?Abdominal:  ?   Palpations: Abdomen is soft.  ?   Tenderness: There is no abdominal tenderness. There is no guarding or rebound.  ?Genitourinary: ?   Comments: Chaperone present.  No gross blood ?Musculoskeletal:     ?   General: No tenderness. Normal range of motion.  ?   Cervical back: Normal range of motion and neck supple.  ?   Comments: Right arm in sling.  ?Skin: ?   General: Skin is warm.  ?Neurological:  ?   Mental Status: She is alert and oriented to person, place, and time.  ?   Cranial Nerves: No cranial nerve deficit.  ?   Motor: No abnormal muscle tone.  ?   Coordination: Coordination normal.  ?   Comments:  5/5 strength throughout. CN 2-12 intact.Equal grip strength.   ?Psychiatric:     ?   Behavior: Behavior normal.  ? ? ?ED Results / Procedures / Treatments   ?Labs ?(all labs ordered are listed, but only abnormal results are displayed) ?Labs Reviewed  ?BASIC METABOLIC PANEL - Abnormal; Notable for the following components:  ?    Result Value  ? Sodium 127 (*)   ? Chloride 92 (*)   ?  CO2 15 (*)   ? Glucose, Bld 161 (*)   ? BUN 66 (*)   ? Creatinine, Ser 4.08 (*)   ? GFR, Estimated 11 (*)   ? Anion gap 20 (*)   ? All other components within normal limits  ?CBC - Abnormal; Notable for the following components:  ? RBC 3.59 (*)   ? Hemoglobin 11.5 (*)   ? HCT 35.1 (*)   ? All other components within normal limits  ?URINALYSIS, ROUTINE W REFLEX MICROSCOPIC - Abnormal; Notable for the following components:  ? Protein,  ur 30 (*)   ? All other components within normal limits  ?CBG MONITORING, ED - Abnormal; Notable for the following components:  ? Glucose-Capillary 158 (*)   ? All other components within normal limits  ?I-STAT ARTERIAL BLOOD GAS, ED - Abnormal; Notable for the following components:  ? pH, Arterial 7.299 (*)   ? pO2, Arterial 82 (*)   ? Bicarbonate 18.8 (*)   ? TCO2 20 (*)   ? Acid-base deficit 7.0 (*)   ? Sodium 131 (*)   ? Potassium 2.9 (*)   ? HCT 27.0 (*)   ? Hemoglobin 9.2 (*)   ? All other components within normal limits  ?CULTURE, BLOOD (ROUTINE X 2)  ?CULTURE, BLOOD (ROUTINE X 2)  ?OCCULT BLOOD X 1 CARD TO LAB, STOOL  ?LACTIC ACID, PLASMA  ?LACTIC ACID, PLASMA  ? ? ?EKG ?EKG Interpretation ? ?Date/Time:  Tuesday Oct 02 2021 18:33:43 EDT ?Ventricular Rate:  115 ?PR Interval:  144 ?QRS Duration: 92 ?QT Interval:  332 ?QTC Calculation: 459 ?R Axis:   -9 ?Text Interpretation: Sinus tachycardia ST & T wave abnormality, consider lateral ischemia Abnormal ECG When compared with ECG of 22-Sep-2021 21:23, PREVIOUS ECG IS PRESENT Rate faster Confirmed by Ezequiel Essex [phone]) on 10/02/2021 6:37:40 PM ? ?Radiology ?CT ABDOMEN PELVIS WO CONTRAST ? ?Result Date: 10/02/2021 ?CLINICAL DATA:  Abdomen pain EXAM: CT ABDOMEN AND PELVIS WITHOUT CONTRAST TECHNIQUE: Multidetector CT imaging of the abdomen and pelvis was performed following the standard protocol without IV contrast. RADIATION DOSE REDUCTION: This exam was performed according to the departmental dose-optimization program which includes  automated exposure control, adjustment of the mA and/or kV according to patient size and/or use of iterative reconstruction technique. COMPARISON:  CT 10/28/2008 FINDINGS: Lower chest: Lung bases demonstrate no acute

## 2021-10-03 DIAGNOSIS — D509 Iron deficiency anemia, unspecified: Secondary | ICD-10-CM | POA: Diagnosis present

## 2021-10-03 DIAGNOSIS — Y92009 Unspecified place in unspecified non-institutional (private) residence as the place of occurrence of the external cause: Secondary | ICD-10-CM | POA: Diagnosis not present

## 2021-10-03 DIAGNOSIS — I110 Hypertensive heart disease with heart failure: Secondary | ICD-10-CM | POA: Diagnosis present

## 2021-10-03 DIAGNOSIS — I252 Old myocardial infarction: Secondary | ICD-10-CM | POA: Diagnosis not present

## 2021-10-03 DIAGNOSIS — N179 Acute kidney failure, unspecified: Secondary | ICD-10-CM | POA: Diagnosis present

## 2021-10-03 DIAGNOSIS — T451X5A Adverse effect of antineoplastic and immunosuppressive drugs, initial encounter: Secondary | ICD-10-CM | POA: Diagnosis present

## 2021-10-03 DIAGNOSIS — Z9181 History of falling: Secondary | ICD-10-CM | POA: Diagnosis not present

## 2021-10-03 DIAGNOSIS — I255 Ischemic cardiomyopathy: Secondary | ICD-10-CM | POA: Diagnosis present

## 2021-10-03 DIAGNOSIS — E876 Hypokalemia: Secondary | ICD-10-CM | POA: Diagnosis not present

## 2021-10-03 DIAGNOSIS — I739 Peripheral vascular disease, unspecified: Secondary | ICD-10-CM | POA: Diagnosis present

## 2021-10-03 DIAGNOSIS — I5042 Chronic combined systolic (congestive) and diastolic (congestive) heart failure: Secondary | ICD-10-CM | POA: Diagnosis present

## 2021-10-03 DIAGNOSIS — W010XXA Fall on same level from slipping, tripping and stumbling without subsequent striking against object, initial encounter: Secondary | ICD-10-CM | POA: Diagnosis present

## 2021-10-03 DIAGNOSIS — Z8371 Family history of colonic polyps: Secondary | ICD-10-CM | POA: Diagnosis not present

## 2021-10-03 DIAGNOSIS — K521 Toxic gastroenteritis and colitis: Secondary | ICD-10-CM | POA: Diagnosis present

## 2021-10-03 DIAGNOSIS — E871 Hypo-osmolality and hyponatremia: Secondary | ICD-10-CM | POA: Diagnosis not present

## 2021-10-03 DIAGNOSIS — E860 Dehydration: Secondary | ICD-10-CM | POA: Diagnosis not present

## 2021-10-03 DIAGNOSIS — R42 Dizziness and giddiness: Secondary | ICD-10-CM | POA: Diagnosis present

## 2021-10-03 DIAGNOSIS — E8721 Acute metabolic acidosis: Secondary | ICD-10-CM | POA: Diagnosis present

## 2021-10-03 DIAGNOSIS — E785 Hyperlipidemia, unspecified: Secondary | ICD-10-CM | POA: Diagnosis present

## 2021-10-03 DIAGNOSIS — G629 Polyneuropathy, unspecified: Secondary | ICD-10-CM | POA: Diagnosis present

## 2021-10-03 DIAGNOSIS — I2510 Atherosclerotic heart disease of native coronary artery without angina pectoris: Secondary | ICD-10-CM | POA: Diagnosis present

## 2021-10-03 DIAGNOSIS — I7143 Infrarenal abdominal aortic aneurysm, without rupture: Secondary | ICD-10-CM | POA: Diagnosis present

## 2021-10-03 DIAGNOSIS — I951 Orthostatic hypotension: Secondary | ICD-10-CM | POA: Diagnosis present

## 2021-10-03 DIAGNOSIS — Z8249 Family history of ischemic heart disease and other diseases of the circulatory system: Secondary | ICD-10-CM | POA: Diagnosis not present

## 2021-10-03 DIAGNOSIS — Z853 Personal history of malignant neoplasm of breast: Secondary | ICD-10-CM | POA: Diagnosis not present

## 2021-10-03 DIAGNOSIS — Z87891 Personal history of nicotine dependence: Secondary | ICD-10-CM | POA: Diagnosis not present

## 2021-10-03 LAB — COMPREHENSIVE METABOLIC PANEL
ALT: 16 U/L (ref 0–44)
AST: 20 U/L (ref 15–41)
Albumin: 3 g/dL — ABNORMAL LOW (ref 3.5–5.0)
Alkaline Phosphatase: 70 U/L (ref 38–126)
Anion gap: 10 (ref 5–15)
BUN: 49 mg/dL — ABNORMAL HIGH (ref 8–23)
CO2: 19 mmol/L — ABNORMAL LOW (ref 22–32)
Calcium: 8.2 mg/dL — ABNORMAL LOW (ref 8.9–10.3)
Chloride: 103 mmol/L (ref 98–111)
Creatinine, Ser: 2.58 mg/dL — ABNORMAL HIGH (ref 0.44–1.00)
GFR, Estimated: 20 mL/min — ABNORMAL LOW (ref >60)
Glucose, Bld: 89 mg/dL (ref 70–99)
Potassium: 2.9 mmol/L — ABNORMAL LOW (ref 3.5–5.1)
Sodium: 132 mmol/L — ABNORMAL LOW (ref 135–145)
Total Bilirubin: 0.6 mg/dL (ref 0.3–1.2)
Total Protein: 6 g/dL — ABNORMAL LOW (ref 6.5–8.1)

## 2021-10-03 LAB — URINALYSIS, ROUTINE W REFLEX MICROSCOPIC
Bilirubin Urine: NEGATIVE
Glucose, UA: NEGATIVE mg/dL
Hgb urine dipstick: NEGATIVE
Ketones, ur: NEGATIVE mg/dL
Leukocytes,Ua: NEGATIVE
Nitrite: NEGATIVE
Protein, ur: 30 mg/dL — AB
Specific Gravity, Urine: 1.013 (ref 1.005–1.030)
pH: 5 (ref 5.0–8.0)

## 2021-10-03 LAB — CK: Total CK: 74 U/L (ref 38–234)

## 2021-10-03 LAB — CBC
HCT: 25 % — ABNORMAL LOW (ref 36.0–46.0)
Hemoglobin: 8.4 g/dL — ABNORMAL LOW (ref 12.0–15.0)
MCH: 33.2 pg (ref 26.0–34.0)
MCHC: 33.6 g/dL (ref 30.0–36.0)
MCV: 98.8 fL (ref 80.0–100.0)
Platelets: 163 10*3/uL (ref 150–400)
RBC: 2.53 MIL/uL — ABNORMAL LOW (ref 3.87–5.11)
RDW: 15.1 % (ref 11.5–15.5)
WBC: 6.2 10*3/uL (ref 4.0–10.5)
nRBC: 0 % (ref 0.0–0.2)

## 2021-10-03 LAB — MAGNESIUM: Magnesium: 1.3 mg/dL — ABNORMAL LOW (ref 1.7–2.4)

## 2021-10-03 LAB — LACTIC ACID, PLASMA: Lactic Acid, Venous: 1.3 mmol/L (ref 0.5–1.9)

## 2021-10-03 MED ORDER — CHLORHEXIDINE GLUCONATE CLOTH 2 % EX PADS
6.0000 | MEDICATED_PAD | Freq: Every day | CUTANEOUS | Status: DC
  Administered 2021-10-03 – 2021-10-04 (×2): 6 via TOPICAL

## 2021-10-03 MED ORDER — ACETAMINOPHEN 325 MG PO TABS
650.0000 mg | ORAL_TABLET | ORAL | Status: DC | PRN
  Administered 2021-10-03 – 2021-10-04 (×4): 650 mg via ORAL
  Filled 2021-10-03 (×4): qty 2

## 2021-10-03 MED ORDER — ENOXAPARIN SODIUM 30 MG/0.3ML IJ SOSY
30.0000 mg | PREFILLED_SYRINGE | INTRAMUSCULAR | Status: DC
  Administered 2021-10-03 – 2021-10-05 (×3): 30 mg via SUBCUTANEOUS
  Filled 2021-10-03 (×3): qty 0.3

## 2021-10-03 MED ORDER — CLOPIDOGREL BISULFATE 75 MG PO TABS
75.0000 mg | ORAL_TABLET | Freq: Every day | ORAL | Status: DC
  Administered 2021-10-03 – 2021-10-05 (×3): 75 mg via ORAL
  Filled 2021-10-03 (×3): qty 1

## 2021-10-03 MED ORDER — LOPERAMIDE HCL 2 MG PO CAPS
4.0000 mg | ORAL_CAPSULE | ORAL | Status: DC | PRN
  Administered 2021-10-03: 4 mg via ORAL
  Filled 2021-10-03: qty 2

## 2021-10-03 MED ORDER — POTASSIUM CHLORIDE 10 MEQ/100ML IV SOLN
10.0000 meq | INTRAVENOUS | Status: AC
  Administered 2021-10-03 (×4): 10 meq via INTRAVENOUS
  Filled 2021-10-03 (×4): qty 100

## 2021-10-03 MED ORDER — LOPERAMIDE HCL 2 MG PO CAPS
4.0000 mg | ORAL_CAPSULE | Freq: Once | ORAL | Status: AC
  Administered 2021-10-03: 4 mg via ORAL
  Filled 2021-10-03: qty 2

## 2021-10-03 MED ORDER — SODIUM CHLORIDE 0.9% FLUSH
10.0000 mL | Freq: Two times a day (BID) | INTRAVENOUS | Status: DC
  Administered 2021-10-04: 10 mL

## 2021-10-03 MED ORDER — GABAPENTIN 100 MG PO CAPS
100.0000 mg | ORAL_CAPSULE | Freq: Two times a day (BID) | ORAL | Status: DC
Start: 2021-10-03 — End: 2021-10-05
  Administered 2021-10-03 – 2021-10-05 (×5): 100 mg via ORAL
  Filled 2021-10-03 (×5): qty 1

## 2021-10-03 MED ORDER — SODIUM CHLORIDE 0.9% FLUSH
10.0000 mL | INTRAVENOUS | Status: DC | PRN
  Administered 2021-10-05: 10 mL

## 2021-10-03 MED ORDER — POTASSIUM CHLORIDE IN NACL 20-0.9 MEQ/L-% IV SOLN
INTRAVENOUS | Status: DC
  Filled 2021-10-03 (×5): qty 1000

## 2021-10-03 MED ORDER — FENTANYL CITRATE PF 50 MCG/ML IJ SOSY: 25.0000 ug | PREFILLED_SYRINGE | INTRAMUSCULAR | Status: DC | PRN

## 2021-10-03 MED ORDER — HYDRALAZINE HCL 20 MG/ML IJ SOLN: 10.0000 mg | Freq: Four times a day (QID) | INTRAMUSCULAR | Status: DC | PRN

## 2021-10-03 MED ORDER — PANTOPRAZOLE SODIUM 40 MG IV SOLR
40.0000 mg | INTRAVENOUS | Status: DC
  Administered 2021-10-03 – 2021-10-05 (×3): 40 mg via INTRAVENOUS
  Filled 2021-10-03 (×3): qty 10

## 2021-10-03 NOTE — ED Notes (Signed)
Report received and care resumed.

## 2021-10-03 NOTE — H&P (Signed)
? ?[facility] ?History and Physical ?                                                                                  ? ?[name], is a 71 y.o. female  MRN: [id number]   DOB - [birth year] ? ?Admit Date - 10/02/2021 ? ?Expected date of discharge   5/5 pending renal recovery based on lab work ? ?Outpatient Primary MD for the patient is Reynold Bowen, MD ? ?Referring MD: [place], EDP ? ?PMH: ?Past Medical History:  ?Diagnosis Date  ? ABDOMINAL AORTIC ANEURYSM   ? Anemia   ? Arthritis   ? HANDS  ? Blood transfusion without reported diagnosis   ? AUGUST 5TH 2017 x2   ? Breast cancer ([place])   ? CAD   ? CONGESTIVE HEART FAILURE, SYSTOLIC, CHRONIC   ? CT, CHEST, ABNORMAL   ? GERD (gastroesophageal reflux disease)   ? Heart attack ([place])   ? x 2  ? History of colon polyps 01/05/2014  ? adenomatous polyps  ? HYPERLIPIDEMIA   ? HYPERTENSION   ? ISCHEMIC CARDIOMYOPATHY   ? Lower GI bleed 01/2014  ? NONSPECIFIC ABN FINDNG RAD&OTH EXAM BILARY TRCT   ? Numbness and tingling   ? 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 Paresthesia of both hands   ? TOBACCO ABUSE   ?   ? ?PSH: ?Past Surgical History:  ?Procedure Laterality Date  ? bare metal stent placement    ? left anterior descending artery x 2  ? COLONOSCOPY N/A 01/05/2014  ? Procedure: COLONOSCOPY;  Surgeon: Jerene Bears, MD;  Location: [facility];  Service: Endoscopy;  Laterality: N/A;  ? COLONOSCOPY N/A 01/06/2014  ? Procedure: COLONOSCOPY;  Surgeon: Jerene Bears, MD;  Location: [facility];  Service: Endoscopy;  Laterality: N/A;  ? COLONOSCOPY    ? POLYPECTOMY    ? ? ? ?CC:  ?Chief Complaint  ?Patient presents with  ? Fall  ?  ? ?HPI: ?71 year old female patient with history of breast cancer on continued pharmacotherapy, chronic iron deficiency anemia, history of ischemic cardiomyopathy with persistent diastolic heart failure, hypertension, dyslipidemia, chronic hyponatremia with baseline sodium between 130 and 134, chronic iron deficiency anemia and frequent  falls.  On recent follow-up visit with her orthopedic physician she has had greater than 10 falls in the past year secondary to her underlying peripheral neuropathy.  Patient presented to the ER after another fall.  No overt injury or loss of consciousness.  Patient had been very dizzy and had not taken her blood pressure medicine for about 3 days.  She had not taken her Plavix as well.  She has had no change in her chronic diarrhea that began after chemotherapy.  No GI bleeding symptoms.  No vomiting.  No fevers.  Had any chest pain although she states she has not been eating or drinking much. ? ?ER Evaluation and treatment: ?In the ER serologies consistent with acute kidney injury with a creatinine greater than 4 noting baseline creatinine 0.68. ?Presenting blood pressure was low at 82/59.  She was tachycardic. ?Orthostatic vital signs were also positive with a drop in systolic from 409 lying  to 116 standing ?As noted above sodium slightly lower than baseline, 127.  CO2 low at 15, BUN elevated at 66 with a creatinine of 4.08 and a GFR of 11.  Anion gap 20. ?Lactic acid was normal but ABG pH 7.299 with low bicarb consistent with metabolic acidosis ?Patient with hemoconcentration hemoglobin of 11.5 with hemoglobin decreasing to 9.2 after initial volume resuscitation in the ER. ?FOB negative ?Chest x-ray negative with stable minimally displaced fracture of right humeral neck ?CT abdomen and pelvis without contrast without any acute findings.  There was a finding of mild right hydronephrosis without obstructing kidney stone in the known AAA measured 3.5 cm ? ? ?Review of Systems   ?In addition to the HPI above,  ?No Fever-chills, myalgias or other constitutional symptoms ?No Headache, changes with Vision or hearing, new weakness, tingling, numbness in any extremity, ?No problems swallowing food or Liquids, indigestion/reflux ?No Chest pain, Cough or Shortness of Breath, palpitations, orthopnea or DOE-patient reported  dizziness and generalized weakness and was found to be orthostatic upon presentation ?No Abdominal pain, no emesis but positive nausea; no melena or hematochezia, no dark tarry stools, Bowel movements are regular and has chronic diarrhea secondary to oral chemotherapy ?No dysuria, hematuria or flank pain ?No new skin rashes, lesions, masses or bruises, ?No new joints pains-aches other than as described above ?No recent weight gain or loss ?No polyuria, polydypsia or polyphagia, ? ?*A full 10 point Review of Systems was done, except as stated above, all other Review of Systems were negative. ? ?Social History ?Social History  ? ?Tobacco Use  ? Smoking status: Former  ?  Types: Cigarettes, E-cigarettes  ?  Quit date: 2013  ?  Years since quitting: 10.3  ?  Passive exposure: Never  ? Smokeless tobacco: Never  ?Substance Use Topics  ? Alcohol use: Not Currently  ? ? ?Resides at: ?Home ? ?Lives with: ?Husband ? ?Ambulatory status: ?Ambulates with a cane ? ?Family History ?Family History  ?Problem Relation Age of Onset  ? Lymphoma Mother 39  ? Colon polyps Father   ? Depression Father 62  ?     died of suicide  ? Heart attack Father   ? Diabetes Maternal Grandmother   ? Anemia Sister   ? Colon polyps Sister   ? Lung cancer Sister   ? Colon cancer Neg Hx   ? Esophageal cancer Neg Hx   ? Rectal cancer Neg Hx   ? Stomach cancer Neg Hx   ? ? ? ?Prior to Admission medications   ?Medication Sig Start Date End Date Taking? Authorizing Provider  ?acetaminophen (TYLENOL) 325 MG tablet Take 2 tablets (650 mg total) by mouth every 6 (six) hours as needed for mild pain (or Fever >/= 101). 08/30/21  Yes Phillips Grout, MD  ?clopidogrel (PLAVIX) 75 MG tablet TAKE 1 TABLET BY MOUTH EVERY DAY ?Patient taking differently: Take 75 mg by mouth daily. 06/26/21  Yes Josue Hector, MD  ?docusate sodium (COLACE) 100 MG capsule Take 100 mg by mouth daily as needed for mild constipation.   Yes [provider]  ?DULoxetine (CYMBALTA) 30  MG capsule Take 30 mg by mouth daily. 09/13/21  Yes [provider]  ?gabapentin (NEURONTIN) 300 MG capsule Take 1 capsule (300 mg total) by mouth 2 (two) times daily. ?Patient taking differently: Take 600 mg by mouth 2 (two) times daily. 01/17/20  Yes Narda Amber K, DO  ?iron polysaccharides (NIFEREX) 150 MG capsule Take 150 mg by  mouth daily.   Yes [provider]  ?letrozole (FEMARA) 2.5 MG tablet Take 2.5 mg by mouth daily. 08/05/21  Yes [provider]  ?loperamide (IMODIUM) 2 MG capsule Take 2 mg by mouth daily as needed for diarrhea or loose stools.   Yes [provider]  ?Neratinib Maleate (NERLYNX) 40 MG tablet Take 240 mg by mouth daily. 6 tablets = 240 mg 09/26/21  Yes [provider]  ?oxyCODONE-acetaminophen (PERCOCET/ROXICET) 5-325 MG tablet Take 1 tablet by mouth every 6 (six) hours as needed for severe pain. ?Patient taking differently: Take 0.5-1 tablets by mouth every 6 (six) hours as needed for severe pain. 09/22/21  Yes Roemhildt, Lorin T, PA-C  ?pantoprazole (PROTONIX) 20 MG tablet Take 20 mg by mouth daily as needed for heartburn or indigestion. 12/21/19  Yes [provider]  ?simvastatin (ZOCOR) 40 MG tablet TAKE 1 TABLET(40 MG) BY MOUTH DAILY AT 6 PM ?Patient taking differently: Take 40 mg by mouth daily at 6 PM. TAKE 1 TABLET(40 MG) BY MOUTH DAILY AT 6 PM 04/11/21  Yes Josue Hector, MD  ?Vitamin D, Cholecalciferol, 25 MCG (1000 UT) CAPS Take 2,000 Units by mouth daily.   Yes [provider]  ?carvedilol (COREG) 6.25 MG tablet TAKE 1 TABLET(6.25 MG) BY MOUTH TWICE DAILY ?Patient not taking: Reported on 10/03/2021 04/11/21   Josue Hector, MD  ? ? ?No Known Allergies ? ?Physical Exam ? ?Vitals ? ?Blood pressure (!) 148/71, pulse 87, temperature 98 ?F (36.7 ?C), temperature source Oral, resp. rate 18, height '5\' 2"'$  (1.575 m), weight 44.2 kg, SpO2 95 %. ? ? ?General:  In no acute distress, appears stated age ? ?Psych:  Normal affect,  Denies Suicidal or Homicidal ideations, Awake Alert, Oriented X 3. Speech and thought patterns are clear and appropriate, no apparent short term memory deficits ? ?Neuro:   No focal neurological deficits,

## 2021-10-03 NOTE — TOC Initial Note (Addendum)
Transition of Care (TOC) - Initial/Assessment Note  ? ? ?Patient Details  ?Name: [name] ?MRN: [id number] ?Date of Birth: 07-Dec-1950 ? ?Transition of Care (TOC) CM/SW Contact:    ?Tom-Johnson, Renea Ee, RN ?Phone Number: ?10/03/2021, 12:49 PM ? ?Clinical Narrative:                 ? ?CM spoke with patient at bedside about needs for post hospital transition. Admitted for Acute Renal Failure after a fall at home. Has a humeral fx prior to admission and right upper extremity is in a sling. Has history of breast cancer and on continued pharmacotherapy. From home with husband. Does not have children and states she does not have any living siblings. [place]. Has friends and neighbors that are supportive with care. Has a walker, cane and shower seat at home.  ?PCP is Reynold Bowen, MD and uses [employer].  ?Patient is currently active with [facility]) PT/OT/RN disciplines. ?Awaiting PT/OT eval for disposition. CM will continue to follow with needs. ?  ?Barriers to Discharge: Continued Medical Work up ? ? ?Patient Goals and CMS Choice ?Patient states their goals for this hospitalization and ongoing recovery are:: To return home ?CMS Medicare.gov Compare Post Acute Care list provided to:: Patient ?Choice offered to / list presented to : Patient ? ?Expected Discharge Plan and Services ?  ?  ?Discharge Planning Services: CM Consult ?  ?Living arrangements for the past 2 months: [place] ?                ?  ?  ?  ?  ?  ?  ?  ?  ?  ?  ? ?Prior Living Arrangements/Services ?Living arrangements for the past 2 months: [place] ?Lives with:: Spouse ?Patient language and need for interpreter reviewed:: Yes ?Do you feel safe going back to the place where you live?: Yes      ?Need for Family Participation in Patient Care: Yes (Comment) ?Care giver support system in place?: Yes (comment) ?  ?Criminal Activity/Legal Involvement Pertinent to Current  Situation/Hospitalization: No - Comment as needed ? ?Activities of Daily Living ?Home Assistive Devices/Equipment: Cane (specify quad or straight), Bedside commode/3-in-1 (straight cane) ?ADL Screening (condition at time of admission) ?Patient's cognitive ability adequate to safely complete daily activities?: Yes ?Is the patient deaf or have difficulty hearing?: No ?Does the patient have difficulty seeing, even when wearing glasses/contacts?: No ?Does the patient have difficulty concentrating, remembering, or making decisions?: No ?Patient able to express need for assistance with ADLs?: Yes ?Does the patient have difficulty dressing or bathing?: No ?Independently performs ADLs?: Yes (appropriate for developmental age) ?Does the patient have difficulty walking or climbing stairs?: Yes ?Weakness of Legs: Both ?Weakness of Arms/Hands: Right ? ?Permission Sought/Granted ?Permission sought to share information with : Case Manager, Customer service manager, Family Supports ?Permission granted to share information with : Yes, Verbal Permission Granted ?   ?   ?   ?   ? ?Emotional Assessment ?Appearance:: Appears stated age ?Attitude/Demeanor/Rapport: Engaged, Gracious ?Affect (typically observed): Accepting, Appropriate, Calm, Hopeful ?Orientation: : Oriented to Self, Oriented to Place, Oriented to  Time, Oriented to Situation ?Alcohol / Substance Use: Not Applicable ?Psych Involvement: No (comment) ? ?Admission diagnosis:  Dehydration [E86.0] ?Acute renal failure (ARF) (HCC) [N17.9] ?Acute renal failure, unspecified acute renal failure type ([place]) [N17.9] ?Patient Active Problem List  ? Diagnosis Date Noted  ? Acute renal failure (  ARF) ([place]) 10/02/2021  ? AKI (acute kidney injury) ([place]) 08/28/2021  ? Hyponatremia 08/28/2021  ? Hypomagnesemia 08/28/2021  ? Hypokalemia 08/28/2021  ? Closed fracture of proximal end of right humerus 08/28/2021  ? Generalized weakness 08/28/2021  ? Hypotension 08/28/2021  ? Chronic  diastolic CHF (congestive heart failure) ([place]) 08/28/2021  ? GERD (gastroesophageal reflux disease) 08/28/2021  ? Syncope 08/27/2021  ? Peripheral arterial disease ([place]) 04/26/2019  ? Stenosis of left subclavian artery ([place]) 03/12/2019  ? Paresthesia 07/13/2018  ? Gait abnormality 07/13/2018  ? Acute on chronic anemia 01/16/2016  ? Left carotid bruit 01/16/2016  ? History of colonic polyps 04/20/2014  ? Chronic anticoagulation 04/20/2014  ? Multiple adenomatous polyps 01/24/2014  ? Lower GI bleed 01/05/2014  ? Post-polypectomy bleeding 01/05/2014  ? NONSPECIFIC ABN FINDNG RAD&OTH EXAM BILARY TRCT 11/09/2008  ? Nonspecific (abnormal) findings on radiological and other examination of body structure 11/01/2008  ? CT, CHEST, ABNORMAL 11/01/2008  ? ABDOMINAL AORTIC ANEURYSM 10/26/2008  ? HLD (hyperlipidemia) 10/25/2008  ? TOBACCO ABUSE 10/25/2008  ? Essential hypertension 10/25/2008  ? Coronary atherosclerosis 10/25/2008  ? ISCHEMIC CARDIOMYOPATHY 10/25/2008  ? Congestive heart failure ([place]) 10/25/2008  ? ?PCP:  Reynold Bowen, MD ?Pharmacy:   ?[facility] [facility] [place] ?[place] ?[place] [address] ?Phone: [phone] Fax: [phone] ? ? ? ? ?Social Determinants of Health (SDOH) Interventions ?  ? ?Readmission Risk Interventions ?   ? View : No data to display.  ?  ?  ?  ? ? ? ?

## 2021-10-03 NOTE — Progress Notes (Signed)
New Admission Note:  ? ?Arrival Method: Arrived from [facility] ED via [facility] ?Mental Orientation: Alert and oriented x4 ?Telemetry: Box #16 ?Assessment: Completed ?Skin: Abrasion on rt FA/elbow,scattered old bruises ?IV: Rt Porta cath with NS infusing '@125cc'$ /hr ?Pain: 0/10 ?Tubes: N/A ?Safety Measures: Safety Fall Prevention Plan has been discussed.  ?Admission: Completed ?5MW Orientation: Patient has been oriented to the room, unit and staff.  ?Family: None at bedside ?Notified [facility] flow manager of patient's arrival for admission orders. ? ?Orders have been reviewed and implemented. Will continue to monitor the patient. Call light has been placed within reach and bed alarm has been activated.  ? ?Interior and spatial designer, RN-BC ?Phone number: [phone]  ?

## 2021-10-03 NOTE — ED Notes (Signed)
Bladder scan 158 ml  ?

## 2021-10-04 DIAGNOSIS — E8721 Acute metabolic acidosis: Secondary | ICD-10-CM

## 2021-10-04 DIAGNOSIS — I10 Essential (primary) hypertension: Secondary | ICD-10-CM

## 2021-10-04 DIAGNOSIS — E871 Hypo-osmolality and hyponatremia: Secondary | ICD-10-CM

## 2021-10-04 DIAGNOSIS — N179 Acute kidney failure, unspecified: Secondary | ICD-10-CM

## 2021-10-04 DIAGNOSIS — E876 Hypokalemia: Secondary | ICD-10-CM

## 2021-10-04 DIAGNOSIS — R296 Repeated falls: Secondary | ICD-10-CM

## 2021-10-04 DIAGNOSIS — K529 Noninfective gastroenteritis and colitis, unspecified: Secondary | ICD-10-CM

## 2021-10-04 DIAGNOSIS — D509 Iron deficiency anemia, unspecified: Secondary | ICD-10-CM

## 2021-10-04 DIAGNOSIS — E860 Dehydration: Secondary | ICD-10-CM

## 2021-10-04 LAB — MAGNESIUM: Magnesium: 1.2 mg/dL — ABNORMAL LOW (ref 1.7–2.4)

## 2021-10-04 LAB — CBC WITH DIFFERENTIAL/PLATELET
Abs Immature Granulocytes: 0.02 10*3/uL (ref 0.00–0.07)
Basophils Absolute: 0 10*3/uL (ref 0.0–0.1)
Basophils Relative: 1 %
Eosinophils Absolute: 0.1 10*3/uL (ref 0.0–0.5)
Eosinophils Relative: 2 %
HCT: 24.6 % — ABNORMAL LOW (ref 36.0–46.0)
Hemoglobin: 8.1 g/dL — ABNORMAL LOW (ref 12.0–15.0)
Immature Granulocytes: 0 %
Lymphocytes Relative: 6 %
Lymphs Abs: 0.4 10*3/uL — ABNORMAL LOW (ref 0.7–4.0)
MCH: 32.9 pg (ref 26.0–34.0)
MCHC: 32.9 g/dL (ref 30.0–36.0)
MCV: 100 fL (ref 80.0–100.0)
Monocytes Absolute: 0.7 10*3/uL (ref 0.1–1.0)
Monocytes Relative: 12 %
Neutro Abs: 4.6 10*3/uL (ref 1.7–7.7)
Neutrophils Relative %: 79 %
Platelets: 141 10*3/uL — ABNORMAL LOW (ref 150–400)
RBC: 2.46 MIL/uL — ABNORMAL LOW (ref 3.87–5.11)
RDW: 15.1 % (ref 11.5–15.5)
WBC: 5.8 10*3/uL (ref 4.0–10.5)
nRBC: 0 % (ref 0.0–0.2)

## 2021-10-04 LAB — BASIC METABOLIC PANEL
Anion gap: 5 (ref 5–15)
BUN: 25 mg/dL — ABNORMAL HIGH (ref 8–23)
CO2: 19 mmol/L — ABNORMAL LOW (ref 22–32)
Calcium: 8.2 mg/dL — ABNORMAL LOW (ref 8.9–10.3)
Chloride: 109 mmol/L (ref 98–111)
Creatinine, Ser: 1.17 mg/dL — ABNORMAL HIGH (ref 0.44–1.00)
GFR, Estimated: 51 mL/min — ABNORMAL LOW (ref >60)
Glucose, Bld: 91 mg/dL (ref 70–99)
Potassium: 3.5 mmol/L (ref 3.5–5.1)
Sodium: 133 mmol/L — ABNORMAL LOW (ref 135–145)

## 2021-10-04 LAB — C DIFFICILE QUICK SCREEN W PCR REFLEX
C Diff antigen: NEGATIVE
C Diff interpretation: NOT DETECTED
C Diff toxin: NEGATIVE

## 2021-10-04 MED ORDER — MAGNESIUM SULFATE 4 GM/100ML IV SOLN
4.0000 g | Freq: Once | INTRAVENOUS | Status: AC
  Administered 2021-10-04: 4 g via INTRAVENOUS
  Filled 2021-10-04: qty 100

## 2021-10-04 MED ORDER — ONDANSETRON HCL 4 MG/2ML IJ SOLN
4.0000 mg | Freq: Four times a day (QID) | INTRAMUSCULAR | Status: DC | PRN
  Administered 2021-10-04: 4 mg via INTRAVENOUS
  Filled 2021-10-04: qty 2

## 2021-10-04 NOTE — Evaluation (Signed)
Physical Therapy Evaluation ?Patient Details ?Name: [name] ?MRN: [id number] ?DOB: September 28, 1950 ?Today's Date: 10/04/2021 ? ?History of Present Illness ? 71 y.o. female presents to [facility] hospital on 10/02/2021 after a fall. Pt with persistent dizziness. Pt found to have acute hypokalemia, hypotension. PMH includes breast cancer, anemia, ischemic cardiomyopathy with persistent diastolic HF, HTN, hyponatremia, and frequent falls.  ?Clinical Impression ? Pt presents to PT with deficits in sensation, balance, endurance. Pt denies dizziness during session with negative vestibular assessment. Pt reports her symptoms are "lightheadedness" and typically occur within the first few seconds of ambulation. Pt does demonstrate initial orthostatic BP with standing, with BP returning toward baseline with standing for increased time. PT provides education on compression stockings and increased time between positional changes to reduce risk of orthostatic hypotension. PT recommends continued HHPT at the time of discharge, as well as use of cane for all mobility, including indoors.   ?   ? ?Recommendations for follow up therapy are one component of a multi-disciplinary discharge planning process, led by the attending physician.  Recommendations may be updated based on patient status, additional functional criteria and insurance authorization. ? ?Follow Up Recommendations Home health PT (continued HHPT) ? ?  ?Assistance Recommended at Discharge Intermittent Supervision/Assistance  ?Patient can return home with the following ? A little help with walking and/or transfers;A little help with bathing/dressing/bathroom;Help with stairs or ramp for entrance;Assist for transportation ? ?  ?Equipment Recommendations None recommended by PT (pt owns necessary DME)  ?Recommendations for Other Services ?    ?  ?Functional Status Assessment Patient has had a recent decline in their functional status and demonstrates the ability to make significant  improvements in function in a reasonable and predictable amount of time.  ? ?  ?Precautions / Restrictions Precautions ?Precautions: Fall ?Precaution Comments: enteric ?Restrictions ?Weight Bearing Restrictions: No  ? ?  ? ?Mobility ? Bed Mobility ?Overal bed mobility: Modified Independent ?  ?  ?  ?  ?  ?  ?  ?  ? ?Transfers ?Overall transfer level: Needs assistance ?Equipment used: None ?Transfers: Sit to/from Stand ?Sit to Stand: Supervision ?  ?  ?  ?  ?  ?  ?  ? ?Ambulation/Gait ?Ambulation/Gait assistance: Supervision ?Gait Distance (Feet): 150 Feet ?Assistive device: Straight cane ?Gait Pattern/deviations: Step-through pattern ?Gait velocity: functional ?Gait velocity interpretation: 1.31 - 2.62 ft/sec, indicative of limited community ambulator ?  ?General Gait Details: pt with slowed step-through gait, 2 instances of lateral drift which pt corrects with use of cane or stepping strategy. ? ?Stairs ?  ?  ?  ?  ?  ? ?Wheelchair Mobility ?  ? ?Modified Rankin (Stroke Patients Only) ?  ? ?  ? ?Balance Overall balance assessment: Needs assistance ?Sitting-balance support: No upper extremity supported, Feet supported ?Sitting balance-Leahy Scale: Good ?  ?  ?Standing balance support: No upper extremity supported, Single extremity supported, During functional activity ?Standing balance-Leahy Scale: Poor ?Standing balance comment: benefits from unilateral UE support ?  ?  ?  ?  ?  ?  ?  ?  ?  ?  ?  ?   ? ? ? ?Pertinent Vitals/Pain Pain Assessment ?Pain Assessment: No/denies pain  ? ? ?Home Living Family/patient expects to be discharged to:: Private residence ?Living Arrangements: Spouse/significant other (also in the hospital) ?Available Help at Discharge: Neighbor;Friend(s);Family ?Type of Home: House ?Home Access: Stairs to enter ?Entrance Stairs-Rails: Left ?Entrance Stairs-Number of Steps: 3 ?  ?Home Layout: One level ?Home Equipment: Rolling  Walker (2 wheels);Cane - single point;BSC/3in1;Shower seat ?   ?   ?Prior Function Prior Level of Function : Independent/Modified Independent;History of Falls (last six months);Driving ?  ?  ?  ?  ?  ?  ?Mobility Comments: pt reports utilizing a cane for outdoor activity. Reports she has not fallen when utilizing her cane ?  ?  ? ? ?Hand Dominance  ? Dominant Hand: Right ? ?  ?Extremity/Trunk Assessment  ? Upper Extremity Assessment ?Upper Extremity Assessment: RUE deficits/detail ?RUE Deficits / Details: RUE in sling, ROM deferred 2/2 fx humerus ?  ? ?Lower Extremity Assessment ?Lower Extremity Assessment: RLE deficits/detail;LLE deficits/detail ?RLE Sensation: history of peripheral neuropathy ?LLE Sensation: history of peripheral neuropathy ?  ? ?Cervical / Trunk Assessment ?Cervical / Trunk Assessment: Normal  ?Communication  ? Communication: No difficulties  ?Cognition Arousal/Alertness: Awake/alert ?Behavior During Therapy: [facility] for tasks assessed/performed ?Overall Cognitive Status: Within Functional Limits for tasks assessed ?  ?  ?  ?  ?  ?  ?  ?  ?  ?  ?  ?  ?  ?  ?  ?  ?  ?  ?  ? ?  ?General Comments General comments (skin integrity, edema, etc.): orthostatic vitals documented in flowsheet. Pt denies dizziness with pursuits,saccades, VOR, and HIT. No nystagmus noted during assessment. Pt denies symptoms ? ?  ?Exercises    ? ?Assessment/Plan  ?  ?PT Assessment Patient needs continued PT services  ?PT Problem List Decreased activity tolerance;Decreased balance;Decreased knowledge of use of DME;Impaired sensation ? ?   ?  ?PT Treatment Interventions DME instruction;Gait training;Stair training;Functional mobility training;Therapeutic activities;Therapeutic exercise;Balance training;Neuromuscular re-education;Patient/family education   ? ?PT Goals (Current goals can be found in the Care Plan section)  ?Acute Rehab PT Goals ?Patient Stated Goal: to stop falling ?PT Goal Formulation: With patient ?Time For Goal Achievement: 10/18/21 ?Potential to Achieve Goals: Good ?Additional  Goals ?Additional Goal #1: Pt will score >19/24 on the DGI to indicate a reduced risk for falls ? ?  ?Frequency Min 3X/week ?  ? ? ?Co-evaluation   ?  ?  ?  ?  ? ? ?  ?AM-PAC PT "6 Clicks" Mobility  ?Outcome Measure Help needed turning from your back to your side while in a flat bed without using bedrails?: None ?Help needed moving from lying on your back to sitting on the side of a flat bed without using bedrails?: None ?Help needed moving to and from a bed to a chair (including a wheelchair)?: A Little ?Help needed standing up from a chair using your arms (e.g., wheelchair or bedside chair)?: A Little ?Help needed to walk in hospital room?: A Little ?Help needed climbing 3-5 steps with a railing? : A Little ?6 Click Score: 20 ? ?  ?End of Session   ?Activity Tolerance: Patient tolerated treatment well ?Patient left: in chair;with call bell/phone within reach;with chair alarm set ?Nurse Communication: Mobility status ?PT Visit Diagnosis: Other abnormalities of gait and mobility (R26.89);History of falling (Z91.81) ?  ? ?Time: 3235-5732 ?PT Time Calculation (min) (ACUTE ONLY): 31 min ? ? ?Charges:   PT Evaluation ?$PT Eval Low Complexity: 1 Low ?  ?  ?   ? ? ?Zenaida Niece, PT, DPT ?Acute Rehabilitation ?Pager: [phone] ?Office [phone] ? ? ?Zenaida Niece ?10/04/2021, 9:19 AM ? ?

## 2021-10-04 NOTE — Progress Notes (Signed)
?PROGRESS NOTE ? ? ? ?[name]  XBD:[id number] DOB: Sep 19, 1950 DOA: 10/02/2021 ?PCP: Reynold Bowen, MD  ? ?Brief Narrative:  ?71 year old female with history of hypertension, hyperlipidemia, breast cancer status post chemotherapy, iron deficiency anemia, chronic diastolic CHF, chronic hyponatremia and frequent falls, recent humeral neck fracture being managed conservatively as an outpatient presented with fall without loss of consciousness.  On presentation, she was hypotensive and tachycardic with acute kidney injury with creatinine of 4.08.  CT of the abdomen and pelvis without contrast was without any acute findings with mild right hydronephrosis without obstructing stone.  She was started on IV fluids. ? ?Assessment & Plan: ?  ?Acute kidney injury ?Acute metabolic acidosis ?Dehydration ?-Presented with acute kidney injury possibly from dehydration and diarrhea.  Creatinine 4.08 on presentation.  CT abdomen and pelvis without contrast showed mild right hydronephrosis without obstructing kidney stone.  This can be followed up as an outpatient with urology. ?-Treated with IV fluids.  Continue IV fluids.  Kidney function has much improved.  Creatinine 1.17 today. ?-Repeat a.m. labs. ?-Encourage oral intake.  Advance diet to regular diet. ? ?Hyponatremia ?-From above.  Improving.  Continue IV fluids ? ?Hypokalemia ?-Improving.  Continue replacement intravenously.  Repeat a.m. labs ? ?Hypomagnesemia ?-Replace.  Repeat a.m. labs ? ?Chronic diarrhea with worsening ?-Unclear if this is related to chemotherapy.  Check stool for C. difficile and GI PCR.  If these are negative, will resume Imodium. ? ?Orthostatic hypotension ?History of hypertension ?-Possibly from dehydration.  Blood pressure improved.  Coreg on hold. ? ?Frequent falls ?Recent humeral neck fracture ?-PT eval ?-Continue right upper extremity sling.  Outpatient follow-up with Dr. Lorin Mercy as previously planned. ? ?PAD with associated peripheral  neuropathy ?-Continue Plavix.  Outpatient follow-up ? ?History of breast cancer status postchemotherapy ?-Outpatient follow-up with oncology ? ?Iron deficiency anemia ?-Resume oral iron supplementation once oral intake improves. ? ?Hyperlipidemia ?-Resume statin once oral intake improves ? ?Abdominal aortic aneurysm ?-CT of the abdomen and pelvis showed signs of intrarenal aneurysm 3.5 cm.  Recommend follow-up ultrasound every 2 years. ? ? ?DVT prophylaxis: Lovenox ?Code Status: Full ?Family Communication: None at bedside ?Disposition Plan: ?Status is: Inpatient ?Remains inpatient appropriate because: Of severity of illness.  Need for IV fluids. ? ? ?Consultants: None ? ?Procedures: None ? ?Antimicrobials: None ? ? ?Subjective: ?Patient seen and examined at bedside.  Does not feel like eating yet.  Complains of some nausea but no vomiting.  Had diarrhea this morning and took Imodium this morning.  No fever, chest pain, worsening shortness of breath reported.  Feels weak but feels slightly better. ? ?Objective: ?Vitals:  ? 10/03/21 8341 10/03/21 9622 10/03/21 2059 10/04/21 2979  ?BP:  130/64 135/71 (!) 147/73  ?Pulse:  75 94 89  ?Resp:  '17 18 17  '$ ?Temp:  98.3 ?F (36.8 ?C) 98.5 ?F (36.9 ?C) 97.9 ?F (36.6 ?C)  ?TempSrc:  Oral Oral Oral  ?SpO2:  96% 97% 93%  ?Weight: 44.2 kg     ?Height: '5\' 2"'$  (1.575 m)     ? ? ?Intake/Output Summary (Last 24 hours) at 10/04/2021 1106 ?Last data filed at 10/04/2021 0900 ?Gross per 24 hour  ?Intake 3720.87 ml  ?Output 0 ml  ?Net 3720.87 ml  ? ?Filed Weights  ? 10/02/21 1832 10/03/21 0559 10/03/21 0626  ?Weight: 43.5 kg 45.9 kg 44.2 kg  ? ? ?Examination: ? ?General exam: Appears calm and comfortable.  Looks chronically ill and deconditioned.  Currently on room air. ?Respiratory system: Bilateral  decreased breath sounds at bases ?Cardiovascular system: S1 & S2 heard, Rate controlled ?Gastrointestinal system: Abdomen is nondistended, soft and nontender. Normal bowel sounds heard. ?Extremities:  No cyanosis, clubbing, edema  ?Central nervous system: Alert and oriented. No focal neurological deficits. Moving extremities ?Skin: No rashes, lesions or ulcers ?Psychiatry: Affect is mostly flat.  No signs of agitation. ? ? ? ?Data Reviewed: I have personally reviewed following labs and imaging studies ? ?CBC: ?Recent Labs  ?Lab 10/02/21 ?1845 10/02/21 ?2226 10/03/21 ?0703 10/04/21 ?6294  ?WBC 9.9  --  6.2 5.8  ?NEUTROABS  --   --   --  4.6  ?HGB 11.5* 9.2* 8.4* 8.1*  ?HCT 35.1* 27.0* 25.0* 24.6*  ?MCV 97.8  --  98.8 100.0  ?PLT 225  --  163 141*  ? ?Basic Metabolic Panel: ?Recent Labs  ?Lab 10/02/21 ?1845 10/02/21 ?2226 10/03/21 ?0703 10/03/21 ?1655 10/04/21 ?0804  ?NA 127* 131* 132*  --  133*  ?K 3.7 2.9* 2.9*  --  3.5  ?CL 92*  --  103  --  109  ?CO2 15*  --  19*  --  19*  ?GLUCOSE 161*  --  89  --  91  ?BUN 66*  --  49*  --  25*  ?CREATININE 4.08*  --  2.58*  --  1.17*  ?CALCIUM 10.0  --  8.2*  --  8.2*  ?MG  --   --   --  1.3* 1.2*  ? ?GFR: ?Estimated Creatinine Clearance: 31.7 mL/min (A) (by C-G formula based on SCr of 1.17 mg/dL (H)). ?Liver Function Tests: ?Recent Labs  ?Lab 10/03/21 ?0703  ?AST 20  ?ALT 16  ?ALKPHOS 70  ?BILITOT 0.6  ?PROT 6.0*  ?ALBUMIN 3.0*  ? ?No results for input(s): LIPASE, AMYLASE in the last 168 hours. ?No results for input(s): AMMONIA in the last 168 hours. ?Coagulation Profile: ?No results for input(s): INR, PROTIME in the last 168 hours. ?Cardiac Enzymes: ?Recent Labs  ?Lab 10/03/21 ?0703  ?CKTOTAL 74  ? ?BNP (last 3 results) ?No results for input(s): PROBNP in the last 8760 hours. ?HbA1C: ?No results for input(s): HGBA1C in the last 72 hours. ?CBG: ?Recent Labs  ?Lab 10/02/21 ?1844  ?GLUCAP 158*  ? ?Lipid Profile: ?No results for input(s): CHOL, HDL, LDLCALC, TRIG, CHOLHDL, LDLDIRECT in the last 72 hours. ?Thyroid Function Tests: ?No results for input(s): TSH, T4TOTAL, FREET4, T3FREE, THYROIDAB in the last 72 hours. ?Anemia Panel: ?No results for input(s): VITAMINB12, FOLATE,  FERRITIN, TIBC, IRON, RETICCTPCT in the last 72 hours. ?Sepsis Labs: ?Recent Labs  ?Lab 10/02/21 ?1948 10/03/21 ?0703  ?LATICACIDVEN 0.9 1.3  ? ? ?Recent Results (from the past 240 hour(s))  ?Blood culture (routine x 2)     Status: None (Preliminary result)  ? Collection Time: 10/02/21  7:48 PM  ? Specimen: BLOOD  ?Result Value Ref Range Status  ? Specimen Description   Final  ?  BLOOD RIGHT SIDE PORT ?Performed at [facility], [address] [place], [place] [address] ?  ? Special Requests   Final  ?  BOTTLES DRAWN AEROBIC AND ANAEROBIC Blood Culture adequate volume ?Performed at [facility], [address], [place], [place] [address] ?  ? Culture   Final  ?  NO GROWTH 2 DAYS ?Performed at [facility], [place] [address] [place], [place] [address] ?  ? Report Status PENDING  Incomplete  ?Blood culture (routine x 2)     Status: None (Preliminary result)  ?  Collection Time: 10/03/21  7:03 AM  ? Specimen: BLOOD LEFT HAND  ?Result Value Ref Range Status  ? Specimen Description BLOOD LEFT HAND  Final  ? Special Requests   Final  ?  BOTTLES DRAWN AEROBIC AND ANAEROBIC Blood Culture adequate volume  ? Culture   Final  ?  NO GROWTH < 24 HOURS ?Performed at [facility], [place] [address] [place], [place] [address] ?  ? Report Status PENDING  Incomplete  ?  ? ? ? ? ? ?Radiology Studies: ?CT ABDOMEN PELVIS WO CONTRAST ? ?Result Date: 10/02/2021 ?CLINICAL DATA:  Abdomen pain EXAM: CT ABDOMEN AND PELVIS WITHOUT CONTRAST TECHNIQUE: Multidetector CT imaging of the abdomen and pelvis was performed following the standard protocol without IV contrast. RADIATION DOSE REDUCTION: This exam was performed according to the departmental dose-optimization program which includes automated exposure control, adjustment of the mA and/or kV according to patient size and/or use of iterative reconstruction technique. COMPARISON:  CT 10/28/2008 FINDINGS: Lower chest: Lung bases demonstrate no acute  consolidation or effusion. Normal cardiac size Hepatobiliary: Numerous hypodense liver lesions increased compared to prior, most likely representing cysts, no specific imaging follow-up recommended. Dilated gallbl

## 2021-10-04 NOTE — Evaluation (Addendum)
Occupational Therapy Evaluation ?Patient Details ?Name: [name] ?MRN: [id number] ?DOB: [birth year] ?Today's Date: 10/04/2021 ? ? ?History of Present Illness 71 y.o. female presents to [facility] hospital on 10/02/2021 after a fall. Pt with persistent dizziness. Pt found to have acute hypokalemia, hypotension. PMH includes breast cancer, anemia, ischemic cardiomyopathy with persistent diastolic HF, HTN, hyponatremia, and frequent falls.  ? ?Clinical Impression ?  ?Pt currently at a min assist level for selfcare tasks and functional transfers with use of her single point cane.  She is a high fall risk based on her balance with transfers and toileting tasks as well as recent history of right humeral fracture.  Feel she will benefit from acute care OT to help increase strength and balance as well as increasing ADL independence so she can return home with her spouse.  Feel he can provide only limited assist secondary to needing RW so she will may need additional supervision from other family for safety.      ?   ? ?Recommendations for follow up therapy are one component of a multi-disciplinary discharge planning process, led by the attending physician.  Recommendations may be updated based on patient status, additional functional criteria and insurance authorization.  ? ?Follow Up Recommendations ? Home health OT  ?  ?Assistance Recommended at Discharge Frequent or constant Supervision/Assistance  ?Patient can return home with the following A little help with walking and/or transfers;A little help with bathing/dressing/bathroom;Assist for transportation;Assistance with cooking/housework ? ?  ?Functional Status Assessment ? Patient has had a recent decline in their functional status and demonstrates the ability to make significant improvements in function in a reasonable and predictable amount of time.  ?Equipment Recommendations ? None recommended by OT  ?  ?Recommendations for Other Services   ? ? ?  ?Precautions / Restrictions  Precautions ?Precautions: Fall ?Precaution Comments: enteric ?Required Braces or Orthoses: Sling (at all times except for bathing, dressing) ?Restrictions ?Weight Bearing Restrictions: Yes ?RUE Weight Bearing: Non weight bearing ?Other Position/Activity Restrictions: NWBing RUE secondary to humeral fracture  ? ?  ? ?Mobility Bed Mobility ?Overal bed mobility: Modified Independent ?  ?  ?  ?  ?  ?  ?  ?  ? ?Transfers ?Overall transfer level: Needs assistance ?  ?Transfers: Sit to/from Stand, Bed to chair/wheelchair/BSC ?Sit to Stand: Min guard ?  ?  ?Step pivot transfers: Min assist ?  ?  ?General transfer comment: Min assist for functional mobility to the bathroom and back with occasional staggering with stepping strategy noted to get her balance back. ?  ? ?  ?Balance Overall balance assessment: Needs assistance ?Sitting-balance support: No upper extremity supported, Feet supported ?Sitting balance-Leahy Scale: Good ?  ?  ?Standing balance support: No upper extremity supported, Single extremity supported, During functional activity, Reliant on assistive device for balance ?Standing balance-Leahy Scale: Poor ?Standing balance comment: Needs use of a cane or surface for maintaining balance with transfers and mobility. ?  ?  ?  ?  ?  ?  ?  ?  ?  ?  ?  ?   ? ?ADL either performed or assessed with clinical judgement  ? ?ADL Overall ADL's : Needs assistance/impaired ?Eating/Feeding: Set up;Sitting ?  ?Grooming: Wash/dry hands;Wash/dry face;Oral care;Min guard;Standing ?  ?Upper Body Bathing: Minimal assistance;Sitting ?  ?Lower Body Bathing: Minimal assistance;Sit to/from stand ?  ?Upper Body Dressing : Moderate assistance;Sitting ?Upper Body Dressing Details (indicate cue type and reason): including sling ?Lower Body Dressing: Moderate assistance;Sit to/from stand ?  ?  Toilet Transfer: Minimal Teacher, English as a foreign language;Ambulation ?Toilet Transfer Details (indicate cue type and reason): use of cane ?Toileting- Clothing  Manipulation and Hygiene: Minimal assistance;Sit to/from stand ?  ?  ?  ?Functional mobility during ADLs: Minimal assistance;Cane ?General ADL Comments: Pt reports that her spouse is in the hospital as well and hopefully will get out today.  They eat out a lot and cook a lot of microwave meals.  Has some assistance from other friends per chart but unsure how much.  History of falls, so recommend 24 hr supervision/assist at discharge with [place] recommended.  No dizziness reported with transition to sitting or with standing.  Mod instructional cueing to stand and wait a few seconds before attempting to walk.  ? ? ? ?Vision Baseline Vision/History: 0 No visual deficits ?Ability to See in Adequate Light: 0 Adequate ?Patient Visual Report: No change from baseline ?Vision Assessment?: No apparent visual deficits  ?   ?Perception Perception ?Perception: Within Functional Limits ?  ?Praxis Praxis ?Praxis: Intact ?  ? ?Pertinent Vitals/Pain Pain Assessment ?Pain Assessment: No/denies pain  ? ? ? ?Hand Dominance Right ?  ?Extremity/Trunk Assessment Upper Extremity Assessment ?Upper Extremity Assessment: RUE deficits/detail;LUE deficits/detail ?RUE Deficits / Details: RUE in sling most of the time secondary to humeral fx.  She was able to demonstrate AROM elbow flexion/extension WFLS.  She only exhibits 90% of gross digit extension with grip strength at 3/5.  Did not test elbow strength or shoulder AROM secondary to fx and presumed precautions. ?RUE Sensation: history of peripheral neuropathy ?LUE Deficits / Details: strength 4/5 throughout with gross testing ?LUE Sensation: history of peripheral neuropathy ?  ?  ?  ?  ?  ?Communication Communication ?Communication: No difficulties ?  ?Cognition Arousal/Alertness: Awake/alert ?Behavior During Therapy: [facility] for tasks assessed/performed ?Overall Cognitive Status: Within Functional Limits for tasks assessed ?  ?  ?  ?  ?  ?  ?  ?  ?  ?  ?  ?  ?  ?  ?  ?  ?  ?  ?  ?   ?   ?    ? ? ?Home Living Family/patient expects to be discharged to:: Private residence ?Living Arrangements: Spouse/significant other (also in the hospital) ?Available Help at Discharge: Neighbor;Friend(s);Family ?Type of Home: House ?Home Access: Stairs to enter ?Entrance Stairs-Number of Steps: 3 ?Entrance Stairs-Rails: Left ?Home Layout: One level ?  ?  ?Bathroom Shower/Tub: Tub/shower unit ?  ?Bathroom Toilet: Standard ?Bathroom Accessibility: Yes ?How Accessible: Other (comment) (could access with cane but not a walker) ?Home Equipment: Conservation officer, nature (2 wheels);Cane - single point;BSC/3in1;Shower seat ?  ?  ?  ? ?  ?Prior Functioning/Environment Prior Level of Function : Independent/Modified Independent;History of Falls (last six months);Driving ?  ?  ?  ?  ?  ?  ?Mobility Comments: Pt uses her cane for mobility ?ADLs Comments: Reports not having to have help for bathing and dressing per her report.  Her spouse uses a walker. ?  ? ?  ?  ?OT Problem List: Decreased strength;Decreased range of motion;Decreased activity tolerance;Impaired balance (sitting and/or standing);Pain;Decreased knowledge of use of DME or AE;Impaired UE functional use;Decreased safety awareness ?  ?   ?OT Treatment/Interventions: Self-care/ADL training;Patient/family education;Balance training;Therapeutic activities;DME and/or AE instruction;Therapeutic exercise  ?  ?OT Goals(Current goals can be found in the care plan section) Acute Rehab OT Goals ?Patient Stated Goal: Pt did not state during session, but agreeable to completion of selfcare tasks. ?OT Goal Formulation: With patient ?Time  For Goal Achievement: 10/18/21 ?Potential to Achieve Goals: Good ?ADL Goals ?Pt Will Perform Upper Body Bathing: with supervision;with adaptive equipment ?Pt Will Perform Lower Body Bathing: with supervision;sit to/from stand ?Pt Will Perform Upper Body Dressing: with supervision;sitting ?Pt Will Perform Lower Body Dressing: with supervision;sit to/from  stand ?Pt Will Transfer to Toilet: with supervision;ambulating;regular height toilet ?Pt Will Perform Toileting - Clothing Manipulation and hygiene: with supervision;sit to/from stand ?Pt Will Perform Tub/Shower Tr

## 2021-10-05 DIAGNOSIS — N179 Acute kidney failure, unspecified: Secondary | ICD-10-CM | POA: Diagnosis not present

## 2021-10-05 LAB — BASIC METABOLIC PANEL
Anion gap: 8 (ref 5–15)
BUN: 15 mg/dL (ref 8–23)
CO2: 19 mmol/L — ABNORMAL LOW (ref 22–32)
Calcium: 8.2 mg/dL — ABNORMAL LOW (ref 8.9–10.3)
Chloride: 108 mmol/L (ref 98–111)
Creatinine, Ser: 0.94 mg/dL (ref 0.44–1.00)
GFR, Estimated: >60 mL/min (ref >60)
Glucose, Bld: 82 mg/dL (ref 70–99)
Potassium: 3.3 mmol/L — ABNORMAL LOW (ref 3.5–5.1)
Sodium: 135 mmol/L (ref 135–145)

## 2021-10-05 LAB — CBC WITH DIFFERENTIAL/PLATELET
Abs Immature Granulocytes: 0.01 10*3/uL (ref 0.00–0.07)
Basophils Absolute: 0 10*3/uL (ref 0.0–0.1)
Basophils Relative: 1 %
Eosinophils Absolute: 0.1 10*3/uL (ref 0.0–0.5)
Eosinophils Relative: 2 %
HCT: 24.6 % — ABNORMAL LOW (ref 36.0–46.0)
Hemoglobin: 8.1 g/dL — ABNORMAL LOW (ref 12.0–15.0)
Immature Granulocytes: 0 %
Lymphocytes Relative: 7 %
Lymphs Abs: 0.4 10*3/uL — ABNORMAL LOW (ref 0.7–4.0)
MCH: 32.8 pg (ref 26.0–34.0)
MCHC: 32.9 g/dL (ref 30.0–36.0)
MCV: 99.6 fL (ref 80.0–100.0)
Monocytes Absolute: 0.6 10*3/uL (ref 0.1–1.0)
Monocytes Relative: 10 %
Neutro Abs: 4.8 10*3/uL (ref 1.7–7.7)
Neutrophils Relative %: 80 %
Platelets: 158 10*3/uL (ref 150–400)
RBC: 2.47 MIL/uL — ABNORMAL LOW (ref 3.87–5.11)
RDW: 15.2 % (ref 11.5–15.5)
WBC: 6 10*3/uL (ref 4.0–10.5)
nRBC: 0 % (ref 0.0–0.2)

## 2021-10-05 LAB — GASTROINTESTINAL PANEL BY PCR, STOOL (REPLACES STOOL CULTURE)

## 2021-10-05 LAB — MAGNESIUM: Magnesium: 1.7 mg/dL (ref 1.7–2.4)

## 2021-10-05 MED ORDER — POTASSIUM CHLORIDE CRYS ER 20 MEQ PO TBCR
40.0000 meq | EXTENDED_RELEASE_TABLET | Freq: Once | ORAL | Status: AC
  Administered 2021-10-05: 40 meq via ORAL
  Filled 2021-10-05: qty 2

## 2021-10-05 MED ORDER — HEPARIN SOD (PORK) LOCK FLUSH 100 UNIT/ML IV SOLN
500.0000 [IU] | INTRAVENOUS | Status: AC | PRN
  Administered 2021-10-05: 500 [IU]
  Filled 2021-10-05: qty 5

## 2021-10-05 MED ORDER — MAGNESIUM SULFATE 2 GM/50ML IV SOLN
2.0000 g | Freq: Once | INTRAVENOUS | Status: AC
  Administered 2021-10-05: 2 g via INTRAVENOUS
  Filled 2021-10-05: qty 50

## 2021-10-05 NOTE — Progress Notes (Signed)
DISCHARGE NOTE HOME ?Carnella [place] to be discharged Home per MD order. Discussed prescriptions and follow up appointments with the patient. Prescriptions given to patient; medication list explained in detail. Patient verbalized understanding. ? ?Skin clean, dry and intact without evidence of skin break down, no evidence of skin tears noted. IV catheter discontinued intact. Site without signs and symptoms of complications. Dressing and pressure applied. Pt denies pain at the site currently. No complaints noted. ? ?Patient free of lines, drains, and wounds.  ? ?An After Visit Summary (AVS) was printed and given to the patient. ?Patient escorted via wheelchair, and discharged home via private auto. ? ?Berneta Levins, RN  ?

## 2021-10-05 NOTE — Progress Notes (Signed)
Brief Nutrition Follow-up: ? ?Pt triggered for assessment due to MST but being discharged today.  ? ?Pt with diarrhea x 1-2 months; pt reports she believes the "chemo drug" is what triggered the diarrhea. Pt reports she began the chemo drug about 2 months ago and when dose was increased to 6 pills was the around the same time the diarrhea began. Pt reports wt loss of 15 pounds in 2 months; pt is underweight and pt would like to gain weight. ? ?Pt eats breakfast which includes eggs, toast, sausage; lunch is usually toast with butter and jelly and pt eats a "light dinner"  ? ?Provided pt with "Diarrhea Nutrition Therapy" and encouraged pt to begin taking an MVI post discharge. Pt has Boost at home but not drinking regularly; encouraged pt to being using regularly; encouraged pt to make into smoothie, milkshake, etc to make the supplement more palatable.  ? ?Pt is followed receives Oncology care at [facility] and has a follow-up coming up soon. Encouraged pt to express nutrition concerns to Oncologist and encouraged her to request to speak with Oncology RD.  ? ?Kerman Passey MS, RDN, LDN, CNSC ?Registered Dietitian III ?Clinical Nutrition ?RD Pager and On-Call Pager Number Located in [place]  ? ? ?

## 2021-10-05 NOTE — Discharge Summary (Addendum)
PatientPhysician Discharge Summary  ?[name] CVE:[id number] DOB: [birth year] DOA: 10/02/2021 ? ?PCP: Reynold Bowen, MD ? ?Admit date: 10/02/2021 ?Discharge date: 10/05/2021 ?30 Day Unplanned Readmission Risk Score   ? ?Flowsheet Row ED to Hosp-Admission (Current) from 10/02/2021 in [facility]  ?30 Day Unplanned Readmission Risk Score (%) 19.37 Filed at 10/05/2021 0801  ? ?  ? ? This score is the patient's risk of an unplanned readmission within 30 days of being discharged (0 -100%). The score is based on dignosis, age, lab data, medications, orders, and past utilization.   ?Low:  0-14.9   Medium: 15-21.9   High: 22-29.9   Extreme: 30 and above ? ?  ? ?  ? ? ? ?Admitted From: Home ?Disposition: Home ? ?Recommendations for Outpatient Follow-up:  ?Follow up with PCP in 1-2 weeks ?Please obtain BMP/CBC in one week ?Please follow up with your PCP on the following pending results: ?Unresulted Labs (From admission, onward)  ? ?  Start     Ordered  ? 10/10/21 0500  Creatinine, serum  (enoxaparin (LOVENOX)    CrCl < 30 ml/min)  Once,   R       ?Comments: while on enoxaparin therapy. ?  ? 10/03/21 0938  ? 10/04/21 0746  Gastrointestinal Panel by PCR , Stool  (Gastrointestinal Panel by PCR, Stool                                                                                                                                                     **Does Not include CLOSTRIDIUM DIFFICILE testing. **If CDIFF testing is needed, place order from the "C Difficile Testing" order set.**)  Once,   R       ? 10/04/21 0745  ? ?  ?  ? ?  ?  ? ? ?Home Health: Yes ?Equipment/Devices: None ? ?Discharge Condition: Stable ?CODE STATUS: Full code ?Diet recommendation: Cardiac ? ?Subjective: Seen and examined.  She feels well.  No complaints.  She is excited to go home. ? ?Brief/Interim Summary: 71 year old female with history of hypertension, hyperlipidemia, breast cancer status post chemotherapy, iron deficiency anemia, chronic  diastolic CHF, chronic hyponatremia and frequent falls, recent humeral neck fracture being managed conservatively as an outpatient presented with fall without loss of consciousness.  On presentation, she was hypotensive and tachycardic with acute kidney injury with creatinine of 4.08.  CT of the abdomen and pelvis without contrast was without any acute findings with mild right hydronephrosis without obstructing stone.  She was started on IV fluids.  She also had metabolic acidosis and dehydration.  Over the course of last few days, her dehydration has improved and her acute kidney injury has resolved.  She is tolerating regular diet.  C. difficile has been negative.  As of documenting this discharge summary, GI pathogen panel is pending  however I expect that to come out negative as well because patient is totally asymptomatic. ? ?Addendum 2:22 PM.  GI pathogen panel is negative. ?  ?Hyponatremia ?-From above.  Resolved. ?  ?Hypokalemia: Slightly low, 3.3 today.  Will replace before discharge. ?  ?Hypomagnesemia: Borderline low normal, 1.7.  Since she has hypokalemia, we will give her dose before discharge. ?  ?Chronic diarrhea with worsening ?-Unclear if this is related to chemotherapy.  Stool negative for C. difficile.  GI pathogen panel pending. ?  ?Orthostatic hypotension ?History of hypertension ?-Possibly from dehydration.  Blood pressure improved.  Resume home medications at the time of discharge. ? ?Frequent falls ?Recent humeral neck fracture ?Evaluated by PT OT.  They recommended home health. ?-Continue right upper extremity sling.  Outpatient follow-up with Dr. Lorin Mercy as previously planned. ? ?PAD with associated peripheral neuropathy ?-Continue Plavix.  Outpatient follow-up ?  ?History of breast cancer status postchemotherapy ?-Outpatient follow-up with oncology ?  ?Iron deficiency anemia ?-Resume oral iron supplementation once oral intake improves. ?  ?Hyperlipidemia: Resume home medications. ?   ?Abdominal aortic aneurysm ?-CT of the abdomen and pelvis showed signs of intrarenal aneurysm 3.5 cm.  Recommend follow-up ultrasound every 2 years. ? ?Discharge plan was discussed with patient and/or family member and they verbalized understanding and agreed with it.  ?Discharge Diagnoses:  ?Active Problems: ?  AKI (acute kidney injury) ([place]) ?  HLD (hyperlipidemia) ?  Essential hypertension ?  Abdominal aortic aneurysm (AAA) 3.0 cm to 5.0 cm in diameter in female [facility]) ?  Peripheral arterial disease ([place]) ?  Hyponatremia ?  Hypomagnesemia ?  Hypokalemia ?  Orthostatic hypertension ?  Chronic diarrhea ?  Iron deficiency anemia ?  Frequent falls ? ? ? ?Discharge Instructions ? ? ?Allergies as of 10/05/2021   ?No Known Allergies ?  ? ?  ?Medication List  ?  ? ?STOP taking these medications   ? ?carvedilol 6.25 MG tablet ?Commonly known as: COREG ?  ? ?  ? ?TAKE these medications   ? ?acetaminophen 325 MG tablet ?Commonly known as: TYLENOL ?Take 2 tablets (650 mg total) by mouth every 6 (six) hours as needed for mild pain (or Fever >/= 101). ?  ?clopidogrel 75 MG tablet ?Commonly known as: PLAVIX ?TAKE 1 TABLET BY MOUTH EVERY DAY ?  ?docusate sodium 100 MG capsule ?Commonly known as: COLACE ?Take 100 mg by mouth daily as needed for mild constipation. ?  ?DULoxetine 30 MG capsule ?Commonly known as: CYMBALTA ?Take 30 mg by mouth daily. ?  ?gabapentin 300 MG capsule ?Commonly known as: NEURONTIN ?Take 1 capsule (300 mg total) by mouth 2 (two) times daily. ?What changed: how much to take ?  ?iron polysaccharides 150 MG capsule ?Commonly known as: NIFEREX ?Take 150 mg by mouth daily. ?  ?letrozole 2.5 MG tablet ?Commonly known as: [place] ?Take 2.5 mg by mouth daily. ?  ?loperamide 2 MG capsule ?Commonly known as: IMODIUM ?Take 2 mg by mouth daily as needed for diarrhea or loose stools. ?  ?Neratinib Maleate 40 MG tablet ?Commonly known as: NERLYNX ?Take 240 mg by mouth daily. 6 tablets = 240 mg ?  ?oxyCODONE-acetaminophen  5-325 MG tablet ?Commonly known as: PERCOCET/ROXICET ?Take 1 tablet by mouth every 6 (six) hours as needed for severe pain. ?What changed: how much to take ?  ?pantoprazole 20 MG tablet ?Commonly known as: PROTONIX ?Take 20 mg by mouth daily as needed for heartburn or indigestion. ?  ?simvastatin 40 MG tablet ?Commonly known  as: ZOCOR ?TAKE 1 TABLET(40 MG) BY MOUTH DAILY AT 6 PM ?What changed: See the new instructions. ?  ?Vitamin D (Cholecalciferol) 25 MCG (1000 UT) Caps ?Take 2,000 Units by mouth daily. ?  ? ?  ? ? Follow-up Information   ? ? Reynold Bowen, MD Follow up in 1 week(s).   ?Specialty: Endocrinology ?Contact information: ?[address] ?[place] [address] ?[phone] ? ? ?  ?  ? ? Josue Hector, MD .   ?Specialty: Cardiology ?Contact information: ?[address] ?[address] ?[place] [address] ?[phone] ? ? ?  ?  ? ?  ?  ? ?  ? ?No Known Allergies ? ?Consultations: None ? ? ?Procedures/Studies: ?CT ABDOMEN PELVIS WO CONTRAST ? ?Result Date: 10/02/2021 ?CLINICAL DATA:  Abdomen pain EXAM: CT ABDOMEN AND PELVIS WITHOUT CONTRAST TECHNIQUE: Multidetector CT imaging of the abdomen and pelvis was performed following the standard protocol without IV contrast. RADIATION DOSE REDUCTION: This exam was performed according to the departmental dose-optimization program which includes automated exposure control, adjustment of the mA and/or kV according to patient size and/or use of iterative reconstruction technique. COMPARISON:  CT 10/28/2008 FINDINGS: Lower chest: Lung bases demonstrate no acute consolidation or effusion. Normal cardiac size Hepatobiliary: Numerous hypodense liver lesions increased compared to prior, most likely representing cysts, no specific imaging follow-up recommended. Dilated gallbladder without calcified stone. No biliary dilatation Pancreas: Unremarkable. No pancreatic ductal dilatation or surrounding inflammatory changes. Spleen: Normal in size without focal abnormality.  Adrenals/Urinary Tract: Adrenal glands are within normal limits. Mild right hydronephrosis without hydroureter. No obstructing stone. The bladder is unremarkable Stomach/Bowel: Stomach nonenlarged. No dilated sma

## 2021-10-05 NOTE — Progress Notes (Signed)
Occupational Therapy Treatment ?Patient Details ?Name: [name] ?MRN: [id number] ?DOB: 09/08/1950 ?Today's Date: 10/05/2021 ? ? ?History of present illness 71 y.o. female presents to [facility] hospital on 10/02/2021 after a fall. Pt with persistent dizziness. Pt found to have acute hypokalemia, hypotension. PMH includes breast cancer, anemia, ischemic cardiomyopathy with persistent diastolic HF, HTN, hyponatremia, and frequent falls. ?  ?OT comments ? Pt eager to discharge home to see her pets. Hopeful that her husband is also discharged from [facility] today. Reinforced fall prevention, R elbow to hand AROM 3 x per day and compensatory strategies for ADLs. Pt verbalized understanding.   ? ?Recommendations for follow up therapy are one component of a multi-disciplinary discharge planning process, led by the attending physician.  Recommendations may be updated based on patient status, additional functional criteria and insurance authorization. ?   ?Follow Up Recommendations ? Home health OT  ?  ?Assistance Recommended at Discharge Frequent or constant Supervision/Assistance  ?Patient can return home with the following ? A little help with walking and/or transfers;A little help with bathing/dressing/bathroom;Assist for transportation;Assistance with cooking/housework ?  ?Equipment Recommendations ? None recommended by OT  ?  ?Recommendations for Other Services   ? ?  ?Precautions / Restrictions Precautions ?Precautions: Fall ?Precaution Comments: enteric ?Required Braces or Orthoses: Sling ?Restrictions ?Weight Bearing Restrictions: Yes ?RUE Weight Bearing: Non weight bearing  ? ? ?  ? ?Mobility Bed Mobility ?Overal bed mobility: Modified Independent ?  ?  ?  ?  ?  ?  ?General bed mobility comments: HOB up ?  ? ?Transfers ?Overall transfer level: Needs assistance ?Equipment used: Straight cane ?Transfers: Sit to/from Stand ?Sit to Stand: Min guard ?  ?  ?  ?  ?  ?  ?  ?  ?Balance   ?  ?Sitting balance-Leahy Scale: Good ?  ?   ?  ?Standing balance-Leahy Scale: Poor ?Standing balance comment: Needs use of a cane or surface for maintaining balance with transfers and mobility. ?  ?  ?  ?  ?  ?  ?  ?  ?  ?  ?  ?   ? ?ADL either performed or assessed with clinical judgement  ? ?ADL Overall ADL's : Needs assistance/impaired ?  ?  ?  ?  ?  ?  ?  ?  ?Upper Body Dressing : Moderate assistance;Sitting ?  ?  ?Lower Body Dressing Details (indicate cue type and reason): pt has been using elastic waist pants, instructed in donning socks one handed ?Toilet Transfer: Min guard;Ambulation;BSC/3in1 ?Toilet Transfer Details (indicate cue type and reason): use of cane ?Toileting- Water quality scientist and Hygiene: Min guard;Sit to/from stand ?  ?  ?  ?Functional mobility during ADLs: Min guard;Cane ?General ADL Comments: Reminded pt to sit and stand momentarily prior to ambulating. Educated in having sitting surfaces throughout home in the event she needs to sit quickly. ?  ? ?Extremity/Trunk Assessment Upper Extremity Assessment ?Upper Extremity Assessment: RUE deficits/detail ?RUE Deficits / Details: elbow to hand AROM, recommended to continue 3 x per day ?  ?  ?  ?  ?  ? ?Vision   ?  ?  ?Perception   ?  ?Praxis   ?  ? ?Cognition Arousal/Alertness: Awake/alert ?Behavior During Therapy: [facility] for tasks assessed/performed ?Overall Cognitive Status: Within Functional Limits for tasks assessed ?  ?  ?  ?  ?  ?  ?  ?  ?  ?  ?  ?  ?  ?  ?  ?  ?  General Comments: pt very aware of her fall risk ?  ?  ?   ?Exercises   ? ?  ?Shoulder Instructions   ? ? ?  ?General Comments    ? ? ?Pertinent Vitals/ Pain       Pain Assessment ?Pain Assessment: No/denies pain ? ?Home Living   ?  ?  ?  ?  ?  ?  ?  ?  ?  ?  ?  ?  ?  ?  ?  ?  ?  ?  ? ?  ?Prior Functioning/Environment    ?  ?  ?  ?   ? ?Frequency ? Min 2X/week  ? ? ? ? ?  ?Progress Toward Goals ? ?OT Goals(current goals can now be found in the care plan section) ? Progress towards OT goals: Progressing toward goals ? ?Acute  Rehab OT Goals ?OT Goal Formulation: With patient ?Time For Goal Achievement: 10/18/21 ?Potential to Achieve Goals: Good  ?Plan Discharge plan remains appropriate   ? ?Co-evaluation ? ? ?   ?  ?  ?  ?  ? ?  ?AM-PAC OT "6 Clicks" Daily Activity     ?Outcome Measure ? ? Help from another person eating meals?: None ?Help from another person taking care of personal grooming?: A Little ?Help from another person toileting, which includes using toliet, bedpan, or urinal?: A Little ?Help from another person bathing (including washing, rinsing, drying)?: A Little ?Help from another person to put on and taking off regular upper body clothing?: A Little ?Help from another person to put on and taking off regular lower body clothing?: A Little ?6 Click Score: 19 ? ?  ?End of Session   ? ?OT Visit Diagnosis: Unsteadiness on feet (R26.81);Repeated falls (R29.6);Muscle weakness (generalized) (M62.81) ?  ?Activity Tolerance Patient tolerated treatment well ?  ?Patient Left in bed;with call bell/phone within reach;with bed alarm set;with nursing/sitter in room ?  ?Nurse Communication   ?  ? ?   ? ?Time: 4585-9292 ?OT Time Calculation (min): 23 min ? ?Charges: OT General Charges ?$OT Visit: 1 Visit ?OT Treatments ?$Self Care/Home Management : 23-37 mins ? ?Malka So ?10/05/2021, 9:11 AM ?Nestor Lewandowsky, OTR/L ?Acute Rehabilitation Services ?Pager: [phone] ?Office: [phone]  ?

## 2021-10-05 NOTE — TOC Transition Note (Signed)
?  Transition of Care (TOC) - CM/SW Discharge Note ? ? ?Patient Details  ?Name: KAYLEAN TUPOU ?MRN: [id number] ?Date of Birth: [birth year] ? ?Transition of Care (TOC) CM/SW Contact:  ?Tom-Johnson, Renea Ee, RN ?Phone Number: ?10/05/2021, 10:04 AM ? ? ?Clinical Narrative:    ? ?Patient is scheduled for discharge today. Home health resumption of care with [facility] and info on AVS. Neighbor to transport at discharge. No further TOC needs noted.  ? ?Final next level of care: [place] ?Barriers to Discharge: Barriers Resolved ? ? ?Patient Goals and CMS Choice ?Patient states their goals for this hospitalization and ongoing recovery are:: To return home ?CMS Medicare.gov Compare Post Acute Care list provided to:: Patient ?Choice offered to / list presented to : Patient ? ?Discharge Placement ?  ?           ?  ?Patient to be transferred to facility by: Family ?  ?  ? ?Discharge Plan and Services ?  ?Discharge Planning Services: CM Consult ?           ?DME Arranged: N/A ?DME Agency: NA ?  ?  ?  ?HH Arranged: PT, OT, RN ?[place] Agency: [place] ([place]) ?Date HH Agency Contacted: 10/03/21 ?Time [place]: [id number] ?Representative spoke with at [place]: Ramond Marrow ? ?Social Determinants of Health (SDOH) Interventions ?  ? ? ?Readmission Risk Interventions ?   ? View : No data to display.  ?  ?  ?  ? ? ? ? ? ?

## 2021-10-05 NOTE — Progress Notes (Signed)
Physical Therapy Treatment ?Patient Details ?Name: [name] ?MRN: [id number] ?DOB: 05-Apr-1951 ?Today's Date: 10/05/2021 ? ? ?History of Present Illness 71 y.o. female presents to [facility] hospital on 10/02/2021 after a fall. Pt with persistent dizziness. Pt found to have acute hypokalemia, hypotension. PMH includes breast cancer, anemia, ischemic cardiomyopathy with persistent diastolic HF, HTN, hyponatremia, and frequent falls. ? ?  ?PT Comments  ? ? Patient progressing towards physical therapy goals. Assisted with donning TED hose for improving BP control. Patient required min guard for sit to stand transfer due to unsteadiness so practice repeated sit to stands x 10 with improved stability. Ambulated 200' with cane and supervision but patient drifting L/R throughout. Patient is at increased risk for falls. Educated patient on taking her time getting up from chairs, monitoring dizziness and sit when needed, and ways to reduce fall risk in the home, patient verbalized understanding. D/c plan remains appropriate.  ?   ?Recommendations for follow up therapy are one component of a multi-disciplinary discharge planning process, led by the attending physician.  Recommendations may be updated based on patient status, additional functional criteria and insurance authorization. ? ?Follow Up Recommendations ? Home health PT ?  ?  ?Assistance Recommended at Discharge Intermittent Supervision/Assistance  ?Patient can return home with the following A little help with walking and/or transfers;A little help with bathing/dressing/bathroom;Help with stairs or ramp for entrance;Assist for transportation ?  ?Equipment Recommendations ? None recommended by PT  ?  ?Recommendations for Other Services   ? ? ?  ?Precautions / Restrictions Precautions ?Precautions: Fall ?Precaution Comments: enteric ?Required Braces or Orthoses: Sling ?Restrictions ?Weight Bearing Restrictions: Yes ?RUE Weight Bearing: Non weight bearing  ?  ? ?Mobility ? Bed  Mobility ?Overal bed mobility: Modified Independent ?  ?  ?  ?  ?  ?  ?  ?  ? ?Transfers ?Overall transfer level: Needs assistance ?Equipment used: Straight cane ?Transfers: Sit to/from Stand ?Sit to Stand: Min guard ?  ?  ?  ?  ?  ?General transfer comment: unsteady upon standing but patient able to steady self by bracing LE against bed. Practiced repeated sit to stands x10 from EOB ?  ? ?Ambulation/Gait ?Ambulation/Gait assistance: Supervision ?Gait Distance (Feet): 200 Feet ?Assistive device: Straight cane ?Gait Pattern/deviations: Step-through pattern ?Gait velocity: functional ?  ?  ?General Gait Details: improved speed but required close supervision due to unsteadiness and drifting ? ? ?Stairs ?  ?  ?  ?  ?  ? ? ?Wheelchair Mobility ?  ? ?Modified Rankin (Stroke Patients Only) ?  ? ? ?  ?Balance Overall balance assessment: Mild deficits observed, not formally tested, History of Falls ?  ?  ?  ?  ?  ?  ?  ?  ?  ?  ?  ?  ?  ?  ?  ?  ?  ?  ?  ? ?  ?Cognition Arousal/Alertness: Awake/alert ?Behavior During Therapy: [facility] for tasks assessed/performed ?Overall Cognitive Status: Within Functional Limits for tasks assessed ?  ?  ?  ?  ?  ?  ?  ?  ?  ?  ?  ?  ?  ?  ?  ?  ?General Comments: pt very aware of her fall risk ?  ?  ? ?  ?Exercises   ? ?  ?General Comments   ?  ?  ? ?Pertinent Vitals/Pain Pain Assessment ?Pain Assessment: No/denies pain  ? ? ?Home Living   ?  ?  ?  ?  ?  ?  ?  ?  ?  ?   ?  ?  Prior Function    ?  ?  ?   ? ?PT Goals (current goals can now be found in the care plan section) Acute Rehab PT Goals ?PT Goal Formulation: With patient ?Time For Goal Achievement: 10/18/21 ?Potential to Achieve Goals: Good ?Progress towards PT goals: Progressing toward goals ? ?  ?Frequency ? ? ? Min 3X/week ? ? ? ?  ?PT Plan Current plan remains appropriate  ? ? ?Co-evaluation   ?  ?  ?  ?  ? ?  ?AM-PAC PT "6 Clicks" Mobility   ?Outcome Measure ? Help needed turning from your back to your side while in a flat bed without  using bedrails?: None ?Help needed moving from lying on your back to sitting on the side of a flat bed without using bedrails?: None ?Help needed moving to and from a bed to a chair (including a wheelchair)?: A Little ?Help needed standing up from a chair using your arms (e.g., wheelchair or bedside chair)?: A Little ?Help needed to walk in hospital room?: A Little ?Help needed climbing 3-5 steps with a railing? : A Little ?6 Click Score: 20 ? ?  ?End of Session Equipment Utilized During Treatment: Gait belt ?Activity Tolerance: Patient tolerated treatment well ?Patient left: in bed;with call bell/phone within reach ?Nurse Communication: Mobility status ?PT Visit Diagnosis: Other abnormalities of gait and mobility (R26.89);History of falling (Z91.81) ?  ? ? ?Time: 6440-3474 ?PT Time Calculation (min) (ACUTE ONLY): 16 min ? ?Charges:  $Gait Training: 8-22 mins          ?          ? ?Lima Chillemi A. Gilford Rile, PT, DPT ?Acute Rehabilitation Services ?Pager [phone] ?Office [phone] ? ? ? ?Jahmil Macleod A Bowen Kia ?10/05/2021, 1:22 PM ? ?

## 2021-10-05 NOTE — Discharge Instructions (Signed)
Abdominal aortic aneurysm ?-CT of the abdomen and pelvis showed signs of intrarenal aneurysm 3.5 cm.  Recommend follow-up ultrasound every 2 years. ?

## 2021-10-05 NOTE — Plan of Care (Signed)
  Problem: Activity: Goal: Risk for activity intolerance will decrease Outcome: Progressing   Problem: Nutrition: Goal: Adequate nutrition will be maintained Outcome: Progressing   

## 2021-10-07 LAB — CULTURE, BLOOD (ROUTINE X 2)
Culture: NO GROWTH
Special Requests: ADEQUATE

## 2021-10-08 DIAGNOSIS — I951 Orthostatic hypotension: Secondary | ICD-10-CM | POA: Diagnosis not present

## 2021-10-08 DIAGNOSIS — Z9181 History of falling: Secondary | ICD-10-CM | POA: Diagnosis not present

## 2021-10-08 DIAGNOSIS — S42201A Unspecified fracture of upper end of right humerus, initial encounter for closed fracture: Secondary | ICD-10-CM | POA: Diagnosis not present

## 2021-10-08 DIAGNOSIS — D509 Iron deficiency anemia, unspecified: Secondary | ICD-10-CM | POA: Diagnosis not present

## 2021-10-08 DIAGNOSIS — E871 Hypo-osmolality and hyponatremia: Secondary | ICD-10-CM | POA: Diagnosis not present

## 2021-10-08 DIAGNOSIS — R269 Unspecified abnormalities of gait and mobility: Secondary | ICD-10-CM | POA: Diagnosis not present

## 2021-10-08 DIAGNOSIS — E876 Hypokalemia: Secondary | ICD-10-CM | POA: Diagnosis not present

## 2021-10-08 DIAGNOSIS — I5032 Chronic diastolic (congestive) heart failure: Secondary | ICD-10-CM | POA: Diagnosis not present

## 2021-10-08 DIAGNOSIS — C50919 Malignant neoplasm of unspecified site of unspecified female breast: Secondary | ICD-10-CM | POA: Diagnosis not present

## 2021-10-08 DIAGNOSIS — E785 Hyperlipidemia, unspecified: Secondary | ICD-10-CM | POA: Diagnosis not present

## 2021-10-08 DIAGNOSIS — K219 Gastro-esophageal reflux disease without esophagitis: Secondary | ICD-10-CM | POA: Diagnosis not present

## 2021-10-08 DIAGNOSIS — Z7902 Long term (current) use of antithrombotics/antiplatelets: Secondary | ICD-10-CM | POA: Diagnosis not present

## 2021-10-08 DIAGNOSIS — I110 Hypertensive heart disease with heart failure: Secondary | ICD-10-CM | POA: Diagnosis not present

## 2021-10-08 DIAGNOSIS — S42201D Unspecified fracture of upper end of right humerus, subsequent encounter for fracture with routine healing: Secondary | ICD-10-CM | POA: Diagnosis not present

## 2021-10-08 LAB — CULTURE, BLOOD (ROUTINE X 2)
Culture: NO GROWTH
Special Requests: ADEQUATE

## 2021-10-11 DIAGNOSIS — Z923 Personal history of irradiation: Secondary | ICD-10-CM | POA: Diagnosis not present

## 2021-10-11 DIAGNOSIS — D649 Anemia, unspecified: Secondary | ICD-10-CM | POA: Diagnosis not present

## 2021-10-11 DIAGNOSIS — I2510 Atherosclerotic heart disease of native coronary artery without angina pectoris: Secondary | ICD-10-CM | POA: Diagnosis not present

## 2021-10-11 DIAGNOSIS — C773 Secondary and unspecified malignant neoplasm of axilla and upper limb lymph nodes: Secondary | ICD-10-CM | POA: Diagnosis not present

## 2021-10-11 DIAGNOSIS — G629 Polyneuropathy, unspecified: Secondary | ICD-10-CM | POA: Diagnosis not present

## 2021-10-11 DIAGNOSIS — I219 Acute myocardial infarction, unspecified: Secondary | ICD-10-CM | POA: Diagnosis not present

## 2021-10-11 DIAGNOSIS — Z79811 Long term (current) use of aromatase inhibitors: Secondary | ICD-10-CM | POA: Diagnosis not present

## 2021-10-11 DIAGNOSIS — C50912 Malignant neoplasm of unspecified site of left female breast: Secondary | ICD-10-CM | POA: Diagnosis not present

## 2021-10-11 DIAGNOSIS — C50412 Malignant neoplasm of upper-outer quadrant of left female breast: Secondary | ICD-10-CM | POA: Diagnosis not present

## 2021-10-11 DIAGNOSIS — Z7902 Long term (current) use of antithrombotics/antiplatelets: Secondary | ICD-10-CM | POA: Diagnosis not present

## 2021-10-11 DIAGNOSIS — T451X5A Adverse effect of antineoplastic and immunosuppressive drugs, initial encounter: Secondary | ICD-10-CM | POA: Diagnosis not present

## 2021-10-11 DIAGNOSIS — Z803 Family history of malignant neoplasm of breast: Secondary | ICD-10-CM | POA: Diagnosis not present

## 2021-10-11 DIAGNOSIS — Z79899 Other long term (current) drug therapy: Secondary | ICD-10-CM | POA: Diagnosis not present

## 2021-10-11 DIAGNOSIS — Z9221 Personal history of antineoplastic chemotherapy: Secondary | ICD-10-CM | POA: Diagnosis not present

## 2021-10-11 DIAGNOSIS — Z95828 Presence of other vascular implants and grafts: Secondary | ICD-10-CM | POA: Diagnosis not present

## 2021-10-11 DIAGNOSIS — E538 Deficiency of other specified B group vitamins: Secondary | ICD-10-CM | POA: Diagnosis not present

## 2021-10-11 DIAGNOSIS — M8580 Other specified disorders of bone density and structure, unspecified site: Secondary | ICD-10-CM | POA: Diagnosis not present

## 2021-10-15 DIAGNOSIS — E785 Hyperlipidemia, unspecified: Secondary | ICD-10-CM | POA: Diagnosis not present

## 2021-10-15 DIAGNOSIS — E559 Vitamin D deficiency, unspecified: Secondary | ICD-10-CM | POA: Diagnosis not present

## 2021-10-15 DIAGNOSIS — I10 Essential (primary) hypertension: Secondary | ICD-10-CM | POA: Diagnosis not present

## 2021-10-16 DIAGNOSIS — S42201D Unspecified fracture of upper end of right humerus, subsequent encounter for fracture with routine healing: Secondary | ICD-10-CM | POA: Diagnosis not present

## 2021-10-16 DIAGNOSIS — I5032 Chronic diastolic (congestive) heart failure: Secondary | ICD-10-CM | POA: Diagnosis not present

## 2021-10-16 DIAGNOSIS — Z9181 History of falling: Secondary | ICD-10-CM | POA: Diagnosis not present

## 2021-10-16 DIAGNOSIS — C50919 Malignant neoplasm of unspecified site of unspecified female breast: Secondary | ICD-10-CM | POA: Diagnosis not present

## 2021-10-16 DIAGNOSIS — E876 Hypokalemia: Secondary | ICD-10-CM | POA: Diagnosis not present

## 2021-10-16 DIAGNOSIS — I110 Hypertensive heart disease with heart failure: Secondary | ICD-10-CM | POA: Diagnosis not present

## 2021-10-16 DIAGNOSIS — K219 Gastro-esophageal reflux disease without esophagitis: Secondary | ICD-10-CM | POA: Diagnosis not present

## 2021-10-16 DIAGNOSIS — E785 Hyperlipidemia, unspecified: Secondary | ICD-10-CM | POA: Diagnosis not present

## 2021-10-16 DIAGNOSIS — E871 Hypo-osmolality and hyponatremia: Secondary | ICD-10-CM | POA: Diagnosis not present

## 2021-10-16 DIAGNOSIS — S42201A Unspecified fracture of upper end of right humerus, initial encounter for closed fracture: Secondary | ICD-10-CM | POA: Diagnosis not present

## 2021-10-16 DIAGNOSIS — Z7902 Long term (current) use of antithrombotics/antiplatelets: Secondary | ICD-10-CM | POA: Diagnosis not present

## 2021-10-16 DIAGNOSIS — D509 Iron deficiency anemia, unspecified: Secondary | ICD-10-CM | POA: Diagnosis not present

## 2021-10-16 DIAGNOSIS — R269 Unspecified abnormalities of gait and mobility: Secondary | ICD-10-CM | POA: Diagnosis not present

## 2021-10-16 DIAGNOSIS — I951 Orthostatic hypotension: Secondary | ICD-10-CM | POA: Diagnosis not present

## 2021-10-17 ENCOUNTER — Ambulatory Visit: Payer: PPO | Admitting: Orthopaedic Surgery

## 2021-10-17 ENCOUNTER — Encounter: Payer: Self-pay | Admitting: Orthopaedic Surgery

## 2021-10-17 ENCOUNTER — Ambulatory Visit (INDEPENDENT_AMBULATORY_CARE_PROVIDER_SITE_OTHER): Payer: PPO

## 2021-10-17 VITALS — BP 126/73 | HR 96 | Ht 63.0 in | Wt 92.0 lb

## 2021-10-17 DIAGNOSIS — S42201D Unspecified fracture of upper end of right humerus, subsequent encounter for fracture with routine healing: Secondary | ICD-10-CM | POA: Diagnosis not present

## 2021-10-17 MED ORDER — TRAMADOL HCL 50 MG PO TABS: 50.0000 mg | ORAL_TABLET | Freq: Two times a day (BID) | ORAL | 0 refills | Status: DC | PRN

## 2021-10-17 NOTE — Progress Notes (Signed)
? ?Office Visit Note ?  ?Patient: [name]           ?Date of Birth: 19-Jan-1951           ?MRN: [id number] ?Visit Date: 10/17/2021 ?             ?Requested by: Reynold Bowen, MD ?[address] ?[place],  [place] [address] ?PCP: Reynold Bowen, MD ? ? ?Assessment & Plan: ?Visit Diagnoses:  ?1. Closed fracture of proximal end of right humerus with routine healing, unspecified fracture morphology, subsequent encounter   ? ? ?Plan: Patient can leave her sling off at home she can work on some gentle active assistive range of motion which we instructed.  I plan to recheck her back in 6 weeks and no x-ray needed on return unless she has had specific problems.  We will switch her to some tramadol 20 tablets and and she can use this if needed. ? ?Follow-Up Instructions: No follow-ups on file.  ? ?Orders:  ?Orders Placed This Encounter  ?Procedures  ? XR Shoulder 1V Right  ? ?No orders of the defined types were placed in this encounter. ? ? ? ? Procedures: ?No procedures performed ? ? ?Clinical Data: ?No additional findings. ? ? ?Subjective: ?Chief Complaint  ?Patient presents with  ? Right Shoulder - Follow-up, Fracture  ? ? ?HPI 71 year old female returns follow-up proximal humerus fracture original fall date 08/27/2021.  Repeat fall 09/22/2021.  She remains in satisfactory position on x-ray head minimal amount of pain medication total of 25 tablets of oxycodone total and she has been only taking a fourth of a tablet if she was having pain.  She has a Port-A-Cath in place and she states her oncologist had talked her about starting some medicine infusion for bone density which possibly is Prolia.  No numbness or tingling in her fingers good flexion-extension of her hands she had previous chemotherapy and radiation treatment in the past with breast cancer. ? ?Review of Systems all the systems noncontributory to HPI. ? ? ?Objective: ?Vital Signs: BP 126/73   Pulse 96   Ht '5\' 3"'$  (1.6 m)   Wt 92 lb (41.7 kg)   BMI 16.30  kg/m?  ? ?Physical Exam ?Constitutional:   ?   Appearance: She is well-developed.  ?HENT:  ?   Head: Normocephalic.  ?   Right Ear: External ear normal.  ?   Left Ear: External ear normal. There is no impacted cerumen.  ?Eyes:  ?   Pupils: Pupils are equal, round, and reactive to light.  ?Neck:  ?   Thyroid: No thyromegaly.  ?   Trachea: No tracheal deviation.  ?Cardiovascular:  ?   Rate and Rhythm: Normal rate.  ?   Comments: Catheter anterior right chest wall below the clavicle. ?Pulmonary:  ?   Effort: Pulmonary effort is normal.  ?Abdominal:  ?   Palpations: Abdomen is soft.  ?Musculoskeletal:  ?   Cervical back: No rigidity.  ?Skin: ?   General: Skin is warm and dry.  ?Neurological:  ?   Mental Status: She is alert and oriented to person, place, and time.  ?Psychiatric:     ?   Behavior: Behavior normal.  ? ? ?Ortho Exam patient proximal distal humerus moves in 1 unit.Internal/external rotation 30 degrees without discomfort.  No swelling of the fingers or hands.  Good sensation. ? ?Specialty Comments:  ?No specialty comments available. ? ?Imaging: ?XR Shoulder 1V Right ? ?Result Date: 10/17/2021 ?Follow-up proximal humerus fracture.  She remains in excellent position without angulation.  Some callus formation is noted for the humeral neck fracture on the right. Impression: Right humeral neck fracture with interval healing maintained excellent position.  ? ? ?PMFS History: ?Patient Active Problem List  ? Diagnosis Date Noted  ? 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 Chronic diarrhea 10/04/2021  ? Iron deficiency anemia 10/04/2021  ? Frequent falls 10/04/2021  ? AKI (acute kidney injury) ([place]) 08/28/2021  ? Hyponatremia 08/28/2021  ? Hypomagnesemia 08/28/2021  ? Hypokalemia 08/28/2021  ? Closed fracture of proximal end of right humerus 08/28/2021  ? Generalized weakness 08/28/2021  ? Hypotension 08/28/2021  ? Chronic diastolic CHF (congestive heart failure) ([place]) 08/28/2021  ? GERD (gastroesophageal reflux  disease) 08/28/2021  ? Syncope 08/27/2021  ? Peripheral arterial disease ([place]) 04/26/2019  ? Stenosis of left subclavian artery ([place]) 03/12/2019  ? Paresthesia 07/13/2018  ? Gait abnormality 07/13/2018  ? Acute on chronic anemia 01/16/2016  ? Left carotid bruit 01/16/2016  ? History of colonic polyps 04/20/2014  ? Chronic anticoagulation 04/20/2014  ? Multiple adenomatous polyps 01/24/2014  ? Lower GI bleed 01/05/2014  ? Post-polypectomy bleeding 01/05/2014  ? NONSPECIFIC ABN FINDNG RAD&OTH EXAM BILARY TRCT 11/09/2008  ? Nonspecific (abnormal) findings on radiological and other examination of body structure 11/01/2008  ? CT, CHEST, ABNORMAL 11/01/2008  ? Abdominal aortic aneurysm (AAA) 3.0 cm to 5.0 cm in diameter in female [facility]) 10/26/2008  ? HLD (hyperlipidemia) 10/25/2008  ? TOBACCO ABUSE 10/25/2008  ? Essential hypertension 10/25/2008  ? Coronary atherosclerosis 10/25/2008  ? ISCHEMIC CARDIOMYOPATHY 10/25/2008  ? Congestive heart failure ([place]) 10/25/2008  ? ?Past Medical History:  ?Diagnosis Date  ? ABDOMINAL AORTIC ANEURYSM   ? Anemia   ? Arthritis   ? HANDS  ? Blood transfusion without reported diagnosis   ? AUGUST 5TH 2017 x2   ? Breast cancer ([place])   ? CAD   ? CONGESTIVE HEART FAILURE, SYSTOLIC, CHRONIC   ? CT, CHEST, ABNORMAL   ? GERD (gastroesophageal reflux disease)   ? Heart attack ([place])   ? x 2  ? History of colon polyps 01/05/2014  ? adenomatous polyps  ? HYPERLIPIDEMIA   ? HYPERTENSION   ? ISCHEMIC CARDIOMYOPATHY   ? Lower GI bleed 01/2014  ? NONSPECIFIC ABN FINDNG RAD&OTH EXAM BILARY TRCT   ? Numbness and tingling   ? 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 Paresthesia of both hands   ? TOBACCO ABUSE   ?  ?Family History  ?Problem Relation Age of Onset  ? Lymphoma Mother 8  ? Colon polyps Father   ? Depression Father 73  ?     died of suicide  ? Heart attack Father   ? Diabetes Maternal Grandmother   ? Anemia Sister   ? Colon polyps Sister   ? Lung cancer Sister   ? Colon cancer Neg Hx   ?  Esophageal cancer Neg Hx   ? Rectal cancer Neg Hx   ? Stomach cancer Neg Hx   ?  ?Past Surgical History:  ?Procedure Laterality Date  ? bare metal stent placement    ? left anterior descending artery x 2  ? COLONOSCOPY N/A 01/05/2014  ? Procedure: COLONOSCOPY;  Surgeon: Jerene Bears, MD;  Location: [facility];  Service: Endoscopy;  Laterality: N/A;  ? COLONOSCOPY N/A 01/06/2014  ? Procedure: COLONOSCOPY;  Surgeon: Jerene Bears, MD;  Location: [facility];  Service: Endoscopy;  Laterality: N/A;  ? COLONOSCOPY    ?  POLYPECTOMY    ? ?Social History  ? ?Occupational History  ? Occupation: retired  ?Tobacco Use  ? Smoking status: Former  ?  Types: Cigarettes, E-cigarettes  ?  Quit date: 2013  ?  Years since quitting: 10.3  ?  Passive exposure: Never  ? Smokeless tobacco: Never  ?Vaping Use  ? Vaping Use: Some days  ?Substance and Sexual Activity  ? Alcohol use: Not Currently  ? Drug use: No  ? Sexual activity: Not on file  ? ? ? ? ? ? ?

## 2021-10-22 DIAGNOSIS — D509 Iron deficiency anemia, unspecified: Secondary | ICD-10-CM | POA: Diagnosis not present

## 2021-10-23 DIAGNOSIS — G609 Hereditary and idiopathic neuropathy, unspecified: Secondary | ICD-10-CM | POA: Diagnosis not present

## 2021-10-23 DIAGNOSIS — I10 Essential (primary) hypertension: Secondary | ICD-10-CM | POA: Diagnosis not present

## 2021-10-23 DIAGNOSIS — R82998 Other abnormal findings in urine: Secondary | ICD-10-CM | POA: Diagnosis not present

## 2021-10-23 DIAGNOSIS — C50912 Malignant neoplasm of unspecified site of left female breast: Secondary | ICD-10-CM | POA: Diagnosis not present

## 2021-10-23 DIAGNOSIS — R269 Unspecified abnormalities of gait and mobility: Secondary | ICD-10-CM | POA: Diagnosis not present

## 2021-10-23 DIAGNOSIS — I2510 Atherosclerotic heart disease of native coronary artery without angina pectoris: Secondary | ICD-10-CM | POA: Diagnosis not present

## 2021-10-23 DIAGNOSIS — D509 Iron deficiency anemia, unspecified: Secondary | ICD-10-CM | POA: Diagnosis not present

## 2021-10-23 DIAGNOSIS — E785 Hyperlipidemia, unspecified: Secondary | ICD-10-CM | POA: Diagnosis not present

## 2021-10-23 DIAGNOSIS — D510 Vitamin B12 deficiency anemia due to intrinsic factor deficiency: Secondary | ICD-10-CM | POA: Diagnosis not present

## 2021-10-23 DIAGNOSIS — Z0000 Encounter for general adult medical examination without abnormal findings: Secondary | ICD-10-CM | POA: Diagnosis not present

## 2021-10-23 DIAGNOSIS — R634 Abnormal weight loss: Secondary | ICD-10-CM | POA: Diagnosis not present

## 2021-10-23 DIAGNOSIS — Z7689 Persons encountering health services in other specified circumstances: Secondary | ICD-10-CM | POA: Diagnosis not present

## 2021-10-23 DIAGNOSIS — I700 Atherosclerosis of aorta: Secondary | ICD-10-CM | POA: Diagnosis not present

## 2021-10-23 DIAGNOSIS — E559 Vitamin D deficiency, unspecified: Secondary | ICD-10-CM | POA: Diagnosis not present

## 2021-10-25 DIAGNOSIS — D509 Iron deficiency anemia, unspecified: Secondary | ICD-10-CM | POA: Diagnosis not present

## 2021-10-25 DIAGNOSIS — I5032 Chronic diastolic (congestive) heart failure: Secondary | ICD-10-CM | POA: Diagnosis not present

## 2021-10-25 DIAGNOSIS — Z7902 Long term (current) use of antithrombotics/antiplatelets: Secondary | ICD-10-CM | POA: Diagnosis not present

## 2021-10-25 DIAGNOSIS — S42201D Unspecified fracture of upper end of right humerus, subsequent encounter for fracture with routine healing: Secondary | ICD-10-CM | POA: Diagnosis not present

## 2021-10-25 DIAGNOSIS — R269 Unspecified abnormalities of gait and mobility: Secondary | ICD-10-CM | POA: Diagnosis not present

## 2021-10-25 DIAGNOSIS — E871 Hypo-osmolality and hyponatremia: Secondary | ICD-10-CM | POA: Diagnosis not present

## 2021-10-25 DIAGNOSIS — K219 Gastro-esophageal reflux disease without esophagitis: Secondary | ICD-10-CM | POA: Diagnosis not present

## 2021-10-25 DIAGNOSIS — I110 Hypertensive heart disease with heart failure: Secondary | ICD-10-CM | POA: Diagnosis not present

## 2021-10-25 DIAGNOSIS — I951 Orthostatic hypotension: Secondary | ICD-10-CM | POA: Diagnosis not present

## 2021-10-25 DIAGNOSIS — E785 Hyperlipidemia, unspecified: Secondary | ICD-10-CM | POA: Diagnosis not present

## 2021-10-25 DIAGNOSIS — Z9181 History of falling: Secondary | ICD-10-CM | POA: Diagnosis not present

## 2021-10-25 DIAGNOSIS — E876 Hypokalemia: Secondary | ICD-10-CM | POA: Diagnosis not present

## 2021-10-25 DIAGNOSIS — S42201A Unspecified fracture of upper end of right humerus, initial encounter for closed fracture: Secondary | ICD-10-CM | POA: Diagnosis not present

## 2021-10-25 DIAGNOSIS — C50919 Malignant neoplasm of unspecified site of unspecified female breast: Secondary | ICD-10-CM | POA: Diagnosis not present

## 2021-11-15 DIAGNOSIS — I2510 Atherosclerotic heart disease of native coronary artery without angina pectoris: Secondary | ICD-10-CM | POA: Diagnosis not present

## 2021-11-15 DIAGNOSIS — M8580 Other specified disorders of bone density and structure, unspecified site: Secondary | ICD-10-CM | POA: Diagnosis not present

## 2021-11-15 DIAGNOSIS — Z923 Personal history of irradiation: Secondary | ICD-10-CM | POA: Diagnosis not present

## 2021-11-15 DIAGNOSIS — C773 Secondary and unspecified malignant neoplasm of axilla and upper limb lymph nodes: Secondary | ICD-10-CM | POA: Diagnosis not present

## 2021-11-15 DIAGNOSIS — Z803 Family history of malignant neoplasm of breast: Secondary | ICD-10-CM | POA: Diagnosis not present

## 2021-11-15 DIAGNOSIS — Z95828 Presence of other vascular implants and grafts: Secondary | ICD-10-CM | POA: Diagnosis not present

## 2021-11-15 DIAGNOSIS — Z9889 Other specified postprocedural states: Secondary | ICD-10-CM | POA: Diagnosis not present

## 2021-11-15 DIAGNOSIS — Z170 Estrogen receptor positive status [ER+]: Secondary | ICD-10-CM | POA: Diagnosis not present

## 2021-11-15 DIAGNOSIS — D630 Anemia in neoplastic disease: Secondary | ICD-10-CM | POA: Diagnosis not present

## 2021-11-15 DIAGNOSIS — Z79899 Other long term (current) drug therapy: Secondary | ICD-10-CM | POA: Diagnosis not present

## 2021-11-15 DIAGNOSIS — Z720 Tobacco use: Secondary | ICD-10-CM | POA: Diagnosis not present

## 2021-11-15 DIAGNOSIS — Z7901 Long term (current) use of anticoagulants: Secondary | ICD-10-CM | POA: Diagnosis not present

## 2021-11-15 DIAGNOSIS — I739 Peripheral vascular disease, unspecified: Secondary | ICD-10-CM | POA: Diagnosis not present

## 2021-11-15 DIAGNOSIS — E785 Hyperlipidemia, unspecified: Secondary | ICD-10-CM | POA: Diagnosis not present

## 2021-11-15 DIAGNOSIS — G629 Polyneuropathy, unspecified: Secondary | ICD-10-CM | POA: Diagnosis not present

## 2021-11-15 DIAGNOSIS — I10 Essential (primary) hypertension: Secondary | ICD-10-CM | POA: Diagnosis not present

## 2021-11-15 DIAGNOSIS — E538 Deficiency of other specified B group vitamins: Secondary | ICD-10-CM | POA: Diagnosis not present

## 2021-11-15 DIAGNOSIS — Z79811 Long term (current) use of aromatase inhibitors: Secondary | ICD-10-CM | POA: Diagnosis not present

## 2021-11-15 DIAGNOSIS — I252 Old myocardial infarction: Secondary | ICD-10-CM | POA: Diagnosis not present

## 2021-11-15 DIAGNOSIS — C50412 Malignant neoplasm of upper-outer quadrant of left female breast: Secondary | ICD-10-CM | POA: Diagnosis not present

## 2021-11-28 ENCOUNTER — Encounter: Payer: Self-pay | Admitting: Orthopaedic Surgery

## 2021-11-28 ENCOUNTER — Ambulatory Visit: Payer: PPO | Admitting: Orthopaedic Surgery

## 2021-11-28 VITALS — BP 117/58 | HR 77 | Ht 63.0 in | Wt 92.0 lb

## 2021-11-28 DIAGNOSIS — S42201D Unspecified fracture of upper end of right humerus, subsequent encounter for fracture with routine healing: Secondary | ICD-10-CM

## 2021-11-28 NOTE — Progress Notes (Signed)
Office Visit Note   Patient: [name]           Date of Birth: Oct 27, 1950           MRN: [id number] Visit Date: 11/28/2021              Requested by: Reynold Bowen, MD [place],  [place] [id number] PCP: Reynold Bowen, MD   Assessment & Plan: Visit Diagnoses:  1. Closed fracture of proximal end of right humerus with routine healing, unspecified fracture morphology, subsequent encounter     Plan: Patient is released from care she can resume normal activities.  Follow-up.  She is happy with results of treatment.  Follow-Up Instructions: Return if symptoms worsen or fail to improve.   Orders:  No orders of the defined types were placed in this encounter.  No orders of the defined types were placed in this encounter.     Procedures: No procedures performed   Clinical Data: No additional findings.   Subjective: Chief Complaint  Patient presents with   Right Shoulder - Fracture, Follow-up    HPI follow-up right proximal humerus fracture from a fall with a second repeat fall.  Fracture essentially nondisplaced she can get her arm up overhead she can wash her hair she can reach back to T12.  She denies pain she only uses Tylenol if she has some soreness is doing normal household activities.  Review of Systems updated unchanged   Objective: Vital Signs: BP (!) 117/58   Pulse 77   Ht '5\' 3"'$  (1.6 m)   Wt 92 lb (41.7 kg)   BMI 16.30 kg/m   Physical Exam Constitutional:      Appearance: She is well-developed.  HENT:     Head: Normocephalic.     Right Ear: External ear normal.     Left Ear: External ear normal. There is no impacted cerumen.  Eyes:     Pupils: Pupils are equal, round, and reactive to light.  Neck:     Thyroid: No thyromegaly.     Trachea: No tracheal deviation.  Cardiovascular:     Rate and Rhythm: Normal rate.  Pulmonary:     Effort: Pulmonary effort is normal.  Abdominal:     Palpations: Abdomen is soft.  Musculoskeletal:      Cervical back: No rigidity.  Skin:    General: Skin is warm and dry.  Neurological:     Mental Status: She is alert and oriented to person, place, and time.  Psychiatric:        Behavior: Behavior normal.     Ortho Exam thin low BMI.  Good range of motion of the shoulder she can flex and abduct shoulder lacking only 10 degrees.  Good internal rotation negative crossarm test.  Supraspinatus is strong.  Good cervical range of motion.  Specialty Comments:  No specialty comments available.  Imaging: No results found.   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   Chronic diarrhea 10/04/2021   Iron deficiency anemia 10/04/2021   Frequent falls 10/04/2021   AKI (acute kidney injury) ([place]) 08/28/2021   Hyponatremia 08/28/2021   Hypomagnesemia 08/28/2021   Hypokalemia 08/28/2021   Closed fracture of proximal end of right humerus 08/28/2021   Generalized weakness 08/28/2021   Hypotension 08/28/2021   Chronic diastolic CHF (congestive heart failure) ([place]) 08/28/2021   GERD (gastroesophageal reflux disease) 08/28/2021   Syncope 08/27/2021   Peripheral arterial disease ([place])  04/26/2019   Stenosis of left subclavian artery ([place]) 03/12/2019   Paresthesia 07/13/2018   Gait abnormality 07/13/2018   Acute on chronic anemia 01/16/2016   Left carotid bruit 01/16/2016   History of colonic polyps 04/20/2014   Chronic anticoagulation 04/20/2014   Multiple adenomatous polyps 01/24/2014   Lower GI bleed 01/05/2014   Post-polypectomy bleeding 01/05/2014   NONSPECIFIC ABN FINDNG RAD&OTH EXAM BILARY TRCT 11/09/2008   Nonspecific (abnormal) findings on radiological and other examination of body structure 11/01/2008   CT, CHEST, ABNORMAL 11/01/2008   Abdominal aortic aneurysm (AAA) 3.0 cm to 5.0 cm in diameter in female ([place]) 10/26/2008   HLD (hyperlipidemia) 10/25/2008   TOBACCO ABUSE 10/25/2008   Essential hypertension  10/25/2008   Coronary atherosclerosis 10/25/2008   ISCHEMIC CARDIOMYOPATHY 10/25/2008   Congestive heart failure ([place]) 10/25/2008   Past Medical History:  Diagnosis Date   ABDOMINAL AORTIC ANEURYSM    Anemia    Arthritis    HANDS   Blood transfusion without reported diagnosis    AUGUST 5TH 2017 x2    Breast cancer ([place])    CAD    CONGESTIVE HEART FAILURE, SYSTOLIC, CHRONIC    CT, CHEST, ABNORMAL    GERD (gastroesophageal reflux disease)    Heart attack ([place])    x 2   History of colon polyps 01/05/2014   adenomatous polyps   HYPERLIPIDEMIA    HYPERTENSION    ISCHEMIC CARDIOMYOPATHY    Lower GI bleed 01/2014   NONSPECIFIC ABN FINDNG RAD&OTH EXAM BILARY TRCT    Numbness and tingling    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   Paresthesia of both hands    TOBACCO ABUSE     Family History  Problem Relation Age of Onset   Lymphoma Mother 16   Colon polyps Father    Depression Father 33       died of suicide   Heart attack Father    Diabetes Maternal Grandmother    Anemia Sister    Colon polyps Sister    Lung cancer Sister    Colon cancer Neg Hx    Esophageal cancer Neg Hx    Rectal cancer Neg Hx    Stomach cancer Neg Hx     Past Surgical History:  Procedure Laterality Date   bare metal stent placement     left anterior descending artery x 2   COLONOSCOPY N/A 01/05/2014   Procedure: COLONOSCOPY;  Surgeon: Jerene Bears, MD;  [facility];  Service: Endoscopy;  Laterality: N/A;   COLONOSCOPY N/A 01/06/2014   Procedure: COLONOSCOPY;  Surgeon: Jerene Bears, MD;  [facility];  Service: Endoscopy;  Laterality: N/A;   COLONOSCOPY     POLYPECTOMY     Social History   Occupational History   Occupation: retired  Tobacco Use   Smoking status: Former    Types: Cigarettes, E-cigarettes    Quit date: 2013    Years since quitting: 10.4    Passive exposure: Never   Smokeless tobacco: Never  Vaping Use   Vaping Use: Some days  Substance and  Sexual Activity   Alcohol use: Not Currently   Drug use: No   Sexual activity: Not on file

## 2021-12-05 DIAGNOSIS — Z681 Body mass index (BMI) 19 or less, adult: Secondary | ICD-10-CM | POA: Diagnosis not present

## 2021-12-05 DIAGNOSIS — C50912 Malignant neoplasm of unspecified site of left female breast: Secondary | ICD-10-CM | POA: Diagnosis not present

## 2021-12-05 DIAGNOSIS — I110 Hypertensive heart disease with heart failure: Secondary | ICD-10-CM | POA: Diagnosis not present

## 2021-12-05 DIAGNOSIS — I509 Heart failure, unspecified: Secondary | ICD-10-CM | POA: Diagnosis not present

## 2021-12-13 DIAGNOSIS — Z79899 Other long term (current) drug therapy: Secondary | ICD-10-CM | POA: Diagnosis not present

## 2021-12-13 DIAGNOSIS — Z803 Family history of malignant neoplasm of breast: Secondary | ICD-10-CM | POA: Diagnosis not present

## 2021-12-13 DIAGNOSIS — Z79811 Long term (current) use of aromatase inhibitors: Secondary | ICD-10-CM | POA: Diagnosis not present

## 2021-12-13 DIAGNOSIS — Z7902 Long term (current) use of antithrombotics/antiplatelets: Secondary | ICD-10-CM | POA: Diagnosis not present

## 2021-12-13 DIAGNOSIS — C773 Secondary and unspecified malignant neoplasm of axilla and upper limb lymph nodes: Secondary | ICD-10-CM | POA: Diagnosis not present

## 2021-12-13 DIAGNOSIS — G629 Polyneuropathy, unspecified: Secondary | ICD-10-CM | POA: Diagnosis not present

## 2021-12-13 DIAGNOSIS — M8580 Other specified disorders of bone density and structure, unspecified site: Secondary | ICD-10-CM | POA: Diagnosis not present

## 2021-12-13 DIAGNOSIS — I739 Peripheral vascular disease, unspecified: Secondary | ICD-10-CM | POA: Diagnosis not present

## 2021-12-13 DIAGNOSIS — D630 Anemia in neoplastic disease: Secondary | ICD-10-CM | POA: Diagnosis not present

## 2021-12-13 DIAGNOSIS — Z170 Estrogen receptor positive status [ER+]: Secondary | ICD-10-CM | POA: Diagnosis not present

## 2021-12-13 DIAGNOSIS — C50412 Malignant neoplasm of upper-outer quadrant of left female breast: Secondary | ICD-10-CM | POA: Diagnosis not present

## 2021-12-13 DIAGNOSIS — Z95828 Presence of other vascular implants and grafts: Secondary | ICD-10-CM | POA: Diagnosis not present

## 2021-12-13 DIAGNOSIS — Z9221 Personal history of antineoplastic chemotherapy: Secondary | ICD-10-CM | POA: Diagnosis not present

## 2021-12-13 DIAGNOSIS — F17210 Nicotine dependence, cigarettes, uncomplicated: Secondary | ICD-10-CM | POA: Diagnosis not present

## 2021-12-13 DIAGNOSIS — E785 Hyperlipidemia, unspecified: Secondary | ICD-10-CM | POA: Diagnosis not present

## 2021-12-13 DIAGNOSIS — I10 Essential (primary) hypertension: Secondary | ICD-10-CM | POA: Diagnosis not present

## 2021-12-13 DIAGNOSIS — I252 Old myocardial infarction: Secondary | ICD-10-CM | POA: Diagnosis not present

## 2021-12-13 DIAGNOSIS — E611 Iron deficiency: Secondary | ICD-10-CM | POA: Diagnosis not present

## 2021-12-13 DIAGNOSIS — I2510 Atherosclerotic heart disease of native coronary artery without angina pectoris: Secondary | ICD-10-CM | POA: Diagnosis not present

## 2021-12-13 DIAGNOSIS — Z923 Personal history of irradiation: Secondary | ICD-10-CM | POA: Diagnosis not present

## 2021-12-13 DIAGNOSIS — E538 Deficiency of other specified B group vitamins: Secondary | ICD-10-CM | POA: Diagnosis not present

## 2021-12-19 DIAGNOSIS — C773 Secondary and unspecified malignant neoplasm of axilla and upper limb lymph nodes: Secondary | ICD-10-CM | POA: Diagnosis not present

## 2021-12-19 DIAGNOSIS — C50912 Malignant neoplasm of unspecified site of left female breast: Secondary | ICD-10-CM | POA: Diagnosis not present

## 2021-12-19 DIAGNOSIS — C50911 Malignant neoplasm of unspecified site of right female breast: Secondary | ICD-10-CM | POA: Diagnosis not present

## 2021-12-19 DIAGNOSIS — C50412 Malignant neoplasm of upper-outer quadrant of left female breast: Secondary | ICD-10-CM | POA: Diagnosis not present

## 2021-12-19 DIAGNOSIS — L7634 Postprocedural seroma of skin and subcutaneous tissue following other procedure: Secondary | ICD-10-CM | POA: Diagnosis not present

## 2022-01-03 DIAGNOSIS — C50912 Malignant neoplasm of unspecified site of left female breast: Secondary | ICD-10-CM | POA: Diagnosis not present

## 2022-01-03 DIAGNOSIS — G9389 Other specified disorders of brain: Secondary | ICD-10-CM | POA: Diagnosis not present

## 2022-01-03 DIAGNOSIS — R296 Repeated falls: Secondary | ICD-10-CM | POA: Diagnosis not present

## 2022-01-10 DIAGNOSIS — G629 Polyneuropathy, unspecified: Secondary | ICD-10-CM | POA: Diagnosis not present

## 2022-01-10 DIAGNOSIS — K5900 Constipation, unspecified: Secondary | ICD-10-CM | POA: Diagnosis not present

## 2022-01-10 DIAGNOSIS — Z79899 Other long term (current) drug therapy: Secondary | ICD-10-CM | POA: Diagnosis not present

## 2022-01-10 DIAGNOSIS — D509 Iron deficiency anemia, unspecified: Secondary | ICD-10-CM | POA: Diagnosis not present

## 2022-01-10 DIAGNOSIS — M8580 Other specified disorders of bone density and structure, unspecified site: Secondary | ICD-10-CM | POA: Diagnosis not present

## 2022-01-10 DIAGNOSIS — F17210 Nicotine dependence, cigarettes, uncomplicated: Secondary | ICD-10-CM | POA: Diagnosis not present

## 2022-01-10 DIAGNOSIS — Z79811 Long term (current) use of aromatase inhibitors: Secondary | ICD-10-CM | POA: Diagnosis not present

## 2022-01-10 DIAGNOSIS — I10 Essential (primary) hypertension: Secondary | ICD-10-CM | POA: Diagnosis not present

## 2022-01-10 DIAGNOSIS — C773 Secondary and unspecified malignant neoplasm of axilla and upper limb lymph nodes: Secondary | ICD-10-CM | POA: Diagnosis not present

## 2022-01-10 DIAGNOSIS — C50912 Malignant neoplasm of unspecified site of left female breast: Secondary | ICD-10-CM | POA: Diagnosis not present

## 2022-01-10 DIAGNOSIS — C50412 Malignant neoplasm of upper-outer quadrant of left female breast: Secondary | ICD-10-CM | POA: Diagnosis not present

## 2022-01-10 DIAGNOSIS — I252 Old myocardial infarction: Secondary | ICD-10-CM | POA: Diagnosis not present

## 2022-01-10 DIAGNOSIS — E538 Deficiency of other specified B group vitamins: Secondary | ICD-10-CM | POA: Diagnosis not present

## 2022-01-10 DIAGNOSIS — R531 Weakness: Secondary | ICD-10-CM | POA: Diagnosis not present

## 2022-01-10 DIAGNOSIS — Z170 Estrogen receptor positive status [ER+]: Secondary | ICD-10-CM | POA: Diagnosis not present

## 2022-01-10 DIAGNOSIS — H538 Other visual disturbances: Secondary | ICD-10-CM | POA: Diagnosis not present

## 2022-01-10 DIAGNOSIS — I739 Peripheral vascular disease, unspecified: Secondary | ICD-10-CM | POA: Diagnosis not present

## 2022-01-10 DIAGNOSIS — M899 Disorder of bone, unspecified: Secondary | ICD-10-CM | POA: Diagnosis not present

## 2022-01-10 DIAGNOSIS — I2510 Atherosclerotic heart disease of native coronary artery without angina pectoris: Secondary | ICD-10-CM | POA: Diagnosis not present

## 2022-01-10 DIAGNOSIS — D630 Anemia in neoplastic disease: Secondary | ICD-10-CM | POA: Diagnosis not present

## 2022-01-10 DIAGNOSIS — Z803 Family history of malignant neoplasm of breast: Secondary | ICD-10-CM | POA: Diagnosis not present

## 2022-01-10 DIAGNOSIS — Z95828 Presence of other vascular implants and grafts: Secondary | ICD-10-CM | POA: Diagnosis not present

## 2022-01-10 DIAGNOSIS — E611 Iron deficiency: Secondary | ICD-10-CM | POA: Diagnosis not present

## 2022-01-10 DIAGNOSIS — R42 Dizziness and giddiness: Secondary | ICD-10-CM | POA: Diagnosis not present

## 2022-01-10 DIAGNOSIS — Z7902 Long term (current) use of antithrombotics/antiplatelets: Secondary | ICD-10-CM | POA: Diagnosis not present

## 2022-01-10 DIAGNOSIS — R937 Abnormal findings on diagnostic imaging of other parts of musculoskeletal system: Secondary | ICD-10-CM | POA: Diagnosis not present

## 2022-01-10 DIAGNOSIS — R2681 Unsteadiness on feet: Secondary | ICD-10-CM | POA: Diagnosis not present

## 2022-01-10 DIAGNOSIS — E785 Hyperlipidemia, unspecified: Secondary | ICD-10-CM | POA: Diagnosis not present

## 2022-01-14 DIAGNOSIS — D259 Leiomyoma of uterus, unspecified: Secondary | ICD-10-CM | POA: Diagnosis not present

## 2022-01-14 DIAGNOSIS — R911 Solitary pulmonary nodule: Secondary | ICD-10-CM | POA: Diagnosis not present

## 2022-01-14 DIAGNOSIS — K7689 Other specified diseases of liver: Secondary | ICD-10-CM | POA: Diagnosis not present

## 2022-01-14 DIAGNOSIS — I714 Abdominal aortic aneurysm, without rupture, unspecified: Secondary | ICD-10-CM | POA: Diagnosis not present

## 2022-01-14 DIAGNOSIS — J439 Emphysema, unspecified: Secondary | ICD-10-CM | POA: Diagnosis not present

## 2022-01-14 DIAGNOSIS — I7143 Infrarenal abdominal aortic aneurysm, without rupture: Secondary | ICD-10-CM | POA: Diagnosis not present

## 2022-01-14 DIAGNOSIS — Z853 Personal history of malignant neoplasm of breast: Secondary | ICD-10-CM | POA: Diagnosis not present

## 2022-01-14 DIAGNOSIS — I700 Atherosclerosis of aorta: Secondary | ICD-10-CM | POA: Diagnosis not present

## 2022-01-14 DIAGNOSIS — J432 Centrilobular emphysema: Secondary | ICD-10-CM | POA: Diagnosis not present

## 2022-01-21 DIAGNOSIS — C50912 Malignant neoplasm of unspecified site of left female breast: Secondary | ICD-10-CM | POA: Diagnosis not present

## 2022-01-21 DIAGNOSIS — S42201A Unspecified fracture of upper end of right humerus, initial encounter for closed fracture: Secondary | ICD-10-CM | POA: Diagnosis not present

## 2022-01-21 DIAGNOSIS — Z853 Personal history of malignant neoplasm of breast: Secondary | ICD-10-CM | POA: Diagnosis not present

## 2022-01-28 DIAGNOSIS — Z681 Body mass index (BMI) 19 or less, adult: Secondary | ICD-10-CM | POA: Diagnosis not present

## 2022-01-28 DIAGNOSIS — D692 Other nonthrombocytopenic purpura: Secondary | ICD-10-CM | POA: Diagnosis not present

## 2022-01-28 DIAGNOSIS — C50912 Malignant neoplasm of unspecified site of left female breast: Secondary | ICD-10-CM | POA: Diagnosis not present

## 2022-01-28 DIAGNOSIS — I110 Hypertensive heart disease with heart failure: Secondary | ICD-10-CM | POA: Diagnosis not present

## 2022-01-28 DIAGNOSIS — I509 Heart failure, unspecified: Secondary | ICD-10-CM | POA: Diagnosis not present

## 2022-01-28 DIAGNOSIS — F330 Major depressive disorder, recurrent, mild: Secondary | ICD-10-CM | POA: Diagnosis not present

## 2022-01-29 DIAGNOSIS — C50412 Malignant neoplasm of upper-outer quadrant of left female breast: Secondary | ICD-10-CM | POA: Diagnosis not present

## 2022-01-29 DIAGNOSIS — Z170 Estrogen receptor positive status [ER+]: Secondary | ICD-10-CM | POA: Diagnosis not present

## 2022-01-29 DIAGNOSIS — E538 Deficiency of other specified B group vitamins: Secondary | ICD-10-CM | POA: Diagnosis not present

## 2022-01-29 DIAGNOSIS — D649 Anemia, unspecified: Secondary | ICD-10-CM | POA: Diagnosis not present

## 2022-02-26 DIAGNOSIS — Z79899 Other long term (current) drug therapy: Secondary | ICD-10-CM | POA: Diagnosis not present

## 2022-02-26 DIAGNOSIS — D519 Vitamin B12 deficiency anemia, unspecified: Secondary | ICD-10-CM | POA: Diagnosis not present

## 2022-02-26 DIAGNOSIS — Z803 Family history of malignant neoplasm of breast: Secondary | ICD-10-CM | POA: Diagnosis not present

## 2022-02-26 DIAGNOSIS — Z95828 Presence of other vascular implants and grafts: Secondary | ICD-10-CM | POA: Diagnosis not present

## 2022-02-26 DIAGNOSIS — D509 Iron deficiency anemia, unspecified: Secondary | ICD-10-CM | POA: Diagnosis not present

## 2022-02-26 DIAGNOSIS — Z923 Personal history of irradiation: Secondary | ICD-10-CM | POA: Diagnosis not present

## 2022-02-26 DIAGNOSIS — C773 Secondary and unspecified malignant neoplasm of axilla and upper limb lymph nodes: Secondary | ICD-10-CM | POA: Diagnosis not present

## 2022-02-26 DIAGNOSIS — C50912 Malignant neoplasm of unspecified site of left female breast: Secondary | ICD-10-CM | POA: Diagnosis not present

## 2022-02-26 DIAGNOSIS — Z79811 Long term (current) use of aromatase inhibitors: Secondary | ICD-10-CM | POA: Diagnosis not present

## 2022-02-26 DIAGNOSIS — F17210 Nicotine dependence, cigarettes, uncomplicated: Secondary | ICD-10-CM | POA: Diagnosis not present

## 2022-02-26 DIAGNOSIS — N6489 Other specified disorders of breast: Secondary | ICD-10-CM | POA: Diagnosis not present

## 2022-02-26 DIAGNOSIS — Z9889 Other specified postprocedural states: Secondary | ICD-10-CM | POA: Diagnosis not present

## 2022-02-26 DIAGNOSIS — I219 Acute myocardial infarction, unspecified: Secondary | ICD-10-CM | POA: Diagnosis not present

## 2022-02-26 DIAGNOSIS — I2510 Atherosclerotic heart disease of native coronary artery without angina pectoris: Secondary | ICD-10-CM | POA: Diagnosis not present

## 2022-02-26 DIAGNOSIS — Z7902 Long term (current) use of antithrombotics/antiplatelets: Secondary | ICD-10-CM | POA: Diagnosis not present

## 2022-02-26 DIAGNOSIS — Z9221 Personal history of antineoplastic chemotherapy: Secondary | ICD-10-CM | POA: Diagnosis not present

## 2022-02-26 DIAGNOSIS — G629 Polyneuropathy, unspecified: Secondary | ICD-10-CM | POA: Diagnosis not present

## 2022-02-26 DIAGNOSIS — E530 Riboflavin deficiency: Secondary | ICD-10-CM | POA: Diagnosis not present

## 2022-02-26 DIAGNOSIS — C50412 Malignant neoplasm of upper-outer quadrant of left female breast: Secondary | ICD-10-CM | POA: Diagnosis not present

## 2022-02-26 DIAGNOSIS — S42201D Unspecified fracture of upper end of right humerus, subsequent encounter for fracture with routine healing: Secondary | ICD-10-CM | POA: Diagnosis not present

## 2022-02-26 DIAGNOSIS — F17290 Nicotine dependence, other tobacco product, uncomplicated: Secondary | ICD-10-CM | POA: Diagnosis not present

## 2022-02-26 DIAGNOSIS — D6481 Anemia due to antineoplastic chemotherapy: Secondary | ICD-10-CM | POA: Diagnosis not present

## 2022-02-26 DIAGNOSIS — R930 Abnormal findings on diagnostic imaging of skull and head, not elsewhere classified: Secondary | ICD-10-CM | POA: Diagnosis not present

## 2022-02-26 DIAGNOSIS — E538 Deficiency of other specified B group vitamins: Secondary | ICD-10-CM | POA: Diagnosis not present

## 2022-02-26 DIAGNOSIS — M8580 Other specified disorders of bone density and structure, unspecified site: Secondary | ICD-10-CM | POA: Diagnosis not present

## 2022-02-26 DIAGNOSIS — Z170 Estrogen receptor positive status [ER+]: Secondary | ICD-10-CM | POA: Diagnosis not present

## 2022-02-26 DIAGNOSIS — I10 Essential (primary) hypertension: Secondary | ICD-10-CM | POA: Diagnosis not present

## 2022-03-01 DIAGNOSIS — I700 Atherosclerosis of aorta: Secondary | ICD-10-CM | POA: Diagnosis not present

## 2022-03-01 DIAGNOSIS — I739 Peripheral vascular disease, unspecified: Secondary | ICD-10-CM | POA: Diagnosis not present

## 2022-03-01 DIAGNOSIS — K7689 Other specified diseases of liver: Secondary | ICD-10-CM | POA: Diagnosis not present

## 2022-03-01 DIAGNOSIS — I5032 Chronic diastolic (congestive) heart failure: Secondary | ICD-10-CM | POA: Diagnosis not present

## 2022-03-01 DIAGNOSIS — I2510 Atherosclerotic heart disease of native coronary artery without angina pectoris: Secondary | ICD-10-CM | POA: Diagnosis not present

## 2022-03-01 DIAGNOSIS — E785 Hyperlipidemia, unspecified: Secondary | ICD-10-CM | POA: Diagnosis not present

## 2022-03-01 DIAGNOSIS — Z23 Encounter for immunization: Secondary | ICD-10-CM | POA: Diagnosis not present

## 2022-03-01 DIAGNOSIS — J432 Centrilobular emphysema: Secondary | ICD-10-CM | POA: Diagnosis not present

## 2022-03-01 DIAGNOSIS — G609 Hereditary and idiopathic neuropathy, unspecified: Secondary | ICD-10-CM | POA: Diagnosis not present

## 2022-03-01 DIAGNOSIS — G119 Hereditary ataxia, unspecified: Secondary | ICD-10-CM | POA: Diagnosis not present

## 2022-03-01 DIAGNOSIS — Z853 Personal history of malignant neoplasm of breast: Secondary | ICD-10-CM | POA: Diagnosis not present

## 2022-03-01 DIAGNOSIS — D509 Iron deficiency anemia, unspecified: Secondary | ICD-10-CM | POA: Diagnosis not present

## 2022-03-01 DIAGNOSIS — C50912 Malignant neoplasm of unspecified site of left female breast: Secondary | ICD-10-CM | POA: Diagnosis not present

## 2022-03-13 NOTE — Progress Notes (Signed)
Date:  03/18/2022   ID:  [name], DOB [birth year], MRN [id number]  PCP:  Reynold Bowen, MD  Cardiologist:   Johnsie Cancel Electrophysiologist:  None   Evaluation Performed:  Follow-Up Visit  Chief Complaint:  CAD/PVD   History of Present Illness:    71 y.o. . anterior wall MI 1998 Rx with BMS to LAD . Edge restenosis Rx re intervention 2008 Quit smoking since then. Echo done 08/16/13 with EF 55-60% mild MR Has had anemia with polypectomy by Dr Henrene Pastor 2017 and again September 2018. BP tends to run higher in office than at home     Having neuropathic symptoms in legs and especially arms RUE worse  Started with "pinched" nerve Seen by Dr Krista Blue neurology labs ok only Vit D low Symptoms not helped by Vitamin B shots  MRI no cervical root impingement    Seen by Dr Gwenlyn Found for PVD. Has left subclavian stenosis and right CIA stenosis ABI"s in normal range 03/05/21 and carotids with plaque no stenosis [place] did show > 50% Left CIA stenosis   Unfortunaetly diagnosed with breast cancer. 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 and lots of friends    Past Medical History:  Diagnosis Date   ABDOMINAL AORTIC ANEURYSM    Anemia    Arthritis    HANDS   Blood transfusion without reported diagnosis    AUGUST 5TH 2017 x2    Breast cancer ([place])    CAD    CONGESTIVE HEART FAILURE, SYSTOLIC, CHRONIC    CT, CHEST, ABNORMAL    GERD (gastroesophageal reflux disease)    Heart attack ([place])    x 2   History of colon polyps 01/05/2014   adenomatous polyps   HYPERLIPIDEMIA    HYPERTENSION    ISCHEMIC  CARDIOMYOPATHY    Lower GI bleed 01/2014   NONSPECIFIC ABN FINDNG RAD&OTH EXAM BILARY TRCT    Numbness and tingling    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   Paresthesia of both hands    TOBACCO ABUSE    Past Surgical History:  Procedure Laterality Date   bare metal stent placement     left anterior descending artery x 2   COLONOSCOPY N/A 01/05/2014   Procedure: COLONOSCOPY;  Surgeon: Jerene Bears, MD;  Location: [facility];  Service: Endoscopy;  Laterality: N/A;   COLONOSCOPY N/A 01/06/2014   Procedure: COLONOSCOPY;  Surgeon: Jerene Bears, MD;  Location: [facility];  Service: Endoscopy;  Laterality: N/A;   COLONOSCOPY     POLYPECTOMY       No outpatient medications have been marked as taking for the 03/18/22 encounter (Office Visit) with Josue Hector, MD.     Allergies:   Patient has no known allergies.   Social History   Tobacco Use   Smoking status: Former    Types: Cigarettes, E-cigarettes    Quit date: 2013    Years since quitting: 10.7    Passive exposure: Never   Smokeless tobacco: Never  Vaping Use  Vaping Use: Some days  Substance Use Topics   Alcohol use: Not Currently   Drug use: No     Family Hx: The patient's family history includes Anemia in her sister; Colon polyps in her father and sister; Depression (age of onset: 58) in her father; Diabetes in her maternal grandmother; Heart attack in her father; Lung cancer in her sister; Lymphoma (age of onset: 35) in her mother. There is no history of Colon cancer, Esophageal cancer, Rectal cancer, or Stomach cancer.  ROS:   Please see the history of present illness.     All other systems reviewed and are negative.   Prior CV studies:   The following studies were reviewed today:  Carotid 03/02/20  AAA [place] 03/02/20 Left CIA velocity 4.6 m/sec   Labs/Other Tests and Data Reviewed:    EKG:  NSR rate 68 normal ECG 08/11/17, 03/18/2022 NSR rate 63 normal   Recent Labs: 08/28/2021: TSH  2.075 10/03/2021: ALT 16 10/05/2021: BUN 15; Creatinine, Ser 0.94; Hemoglobin 8.1; Magnesium 1.7; Platelets 158; Potassium 3.3; Sodium 135   Recent Lipid Panel Lab Results  Component Value Date/Time   CHOL 161 07/28/2013 12:18 PM   TRIG 78.0 07/28/2013 12:18 PM   HDL 65.30 07/28/2013 12:18 PM   CHOLHDL 2 07/28/2013 12:18 PM   LDLCALC 80 07/28/2013 12:18 PM    Wt Readings from Last 3 Encounters:  03/18/22 97 lb (44 kg)  11/28/21 92 lb (41.7 kg)  10/17/21 92 lb (41.7 kg)     Objective:    Vital Signs:  BP (!) 148/92   Pulse 68   Ht '5\' 3"'$  (1.6 m)   Wt 97 lb (44 kg)   SpO2 99%   BMI 17.18 kg/m    Affect appropriate Chronically ill white female  HEENT: normal Neck supple with no adenopathy JVP normal bilateral bruits no thyromegaly Lungs clear with no wheezing and good diaphragmatic motion Heart:  S1/S2 no murmur, no rub, gallop or click PMI normal port a cath over right chest  Abdomen: benighn, BS positve, no tenderness, no AAA no bruit.  No HSM or HJR Distal pulses intact with no bruits No edema Neuro non-focal sensory deficits arms/legs  Skin warm and dry No muscular weakness   ASSESSMENT & PLAN:    CAD: Stable with no angina and good activity level.  Continue medical Rx  LAD stent in 53 with re intervention 09-10-06 low dose beta blocker due to bradycardia    HTN: Well controlled.  Continue current medications and low sodium Dash type diet.  Home readings normal white coat component    HLD:  On statin labs with primary   Anemia:  On iron improved no bleeding  02/19/16 EGD normal colonoscopy with removal of 8 polyps Dr Henrene Pastor did repeat Colonoscopy 02/13/18 and removed 2 more polyps    PVD: f/u Dr Gwenlyn Found moderate LLE dx and left subclavian stenosis without arm weakness Duplex 910/3/22 no ICA Stenosis Left subclavian stenosis but antegrade vertebral flow  Peak velocity 3.6 m/sec Left carotid bruit with plaque no stenosis on duplex 03/2021 f/u next year   Depression: Sister  died 09-09-16  husband with DCM and renal cell cancer PRN valium    Neuropathy:  F/u neurology lab work and MRI's unrevealing to date 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  The importance of social distancing was discussed today.   Medication Adjustments/Labs and Tests Ordered: Current medicines are reviewed at length with the patient today.  Concerns regarding medicines are outlined above.   Tests Ordered:  None  Medication Changes:  None   Disposition:  Follow up with Dr Gwenlyn Found May and me in a year   Signed, Jenkins Rouge, MD  03/18/2022 8:37 AM    [place]

## 2022-03-18 ENCOUNTER — Encounter: Payer: Self-pay | Admitting: Cardiovascular Disease

## 2022-03-18 ENCOUNTER — Ambulatory Visit: Payer: PPO | Attending: Cardiovascular Disease | Admitting: Cardiovascular Disease

## 2022-03-18 VITALS — BP 148/92 | HR 68 | Ht 63.0 in | Wt 97.0 lb

## 2022-03-18 DIAGNOSIS — I739 Peripheral vascular disease, unspecified: Secondary | ICD-10-CM | POA: Diagnosis not present

## 2022-03-18 DIAGNOSIS — I2510 Atherosclerotic heart disease of native coronary artery without angina pectoris: Secondary | ICD-10-CM | POA: Diagnosis not present

## 2022-03-18 DIAGNOSIS — I6523 Occlusion and stenosis of bilateral carotid arteries: Secondary | ICD-10-CM

## 2022-03-18 DIAGNOSIS — I2583 Coronary atherosclerosis due to lipid rich plaque: Secondary | ICD-10-CM

## 2022-03-18 NOTE — Patient Instructions (Signed)
Medication Instructions:  Your physician recommends that you continue on your current medications as directed. Please refer to the Current Medication list given to you today.  *If you need a refill on your cardiac medications before your next appointment, please call your pharmacy*  Lab Work: If you have labs (blood work) drawn today and your tests are completely normal, you will receive your results only by: MyChart Message (if you have MyChart) OR A paper copy in the mail If you have any lab test that is abnormal or we need to change your treatment, we will call you to review the results.  Testing/Procedures: None ordered today.  Follow-Up: At  [facility], you and your health needs are our priority.  As part of our continuing mission to provide you with exceptional heart care, we have created designated Provider Care Teams.  These Care Teams include your primary Cardiologist (physician) and Advanced Practice Providers (APPs -  Physician Assistants and Nurse Practitioners) who all work together to provide you with the care you need, when you need it.  We recommend signing up for the patient portal called "MyChart".  Sign up information is provided on this After Visit Summary.  MyChart is used to connect with patients for Virtual Visits (Telemedicine).  Patients are able to view lab/test results, encounter notes, upcoming appointments, etc.  Non-urgent messages can be sent to your provider as well.   To learn more about what you can do with MyChart, go to [email or web address].    Your next appointment:    6 month(s)  The format for your next appointment:   In Person  Provider:   Peter Nishan, MD     Important Information About Sugar       

## 2022-04-09 ENCOUNTER — Emergency Department (HOSPITAL_COMMUNITY): Payer: PPO

## 2022-04-09 ENCOUNTER — Inpatient Hospital Stay (HOSPITAL_COMMUNITY)
Admission: EM | Admit: 2022-04-09 | Discharge: 2022-04-12 | DRG: 640 | Disposition: A | Discharge status: Home Health | Payer: PPO | Attending: Internal Medicine | Admitting: Internal Medicine

## 2022-04-09 ENCOUNTER — Encounter (HOSPITAL_COMMUNITY): Payer: Self-pay | Admitting: Emergency Medicine

## 2022-04-09 DIAGNOSIS — S42212A Unspecified displaced fracture of surgical neck of left humerus, initial encounter for closed fracture: Secondary | ICD-10-CM | POA: Diagnosis present

## 2022-04-09 DIAGNOSIS — C50919 Malignant neoplasm of unspecified site of unspecified female breast: Secondary | ICD-10-CM | POA: Diagnosis not present

## 2022-04-09 DIAGNOSIS — W19XXXA Unspecified fall, initial encounter: Secondary | ICD-10-CM | POA: Diagnosis not present

## 2022-04-09 DIAGNOSIS — E43 Unspecified severe protein-calorie malnutrition: Secondary | ICD-10-CM | POA: Diagnosis present

## 2022-04-09 DIAGNOSIS — Z87891 Personal history of nicotine dependence: Secondary | ICD-10-CM

## 2022-04-09 DIAGNOSIS — Z955 Presence of coronary angioplasty implant and graft: Secondary | ICD-10-CM

## 2022-04-09 DIAGNOSIS — G629 Polyneuropathy, unspecified: Secondary | ICD-10-CM | POA: Diagnosis present

## 2022-04-09 DIAGNOSIS — I110 Hypertensive heart disease with heart failure: Secondary | ICD-10-CM | POA: Diagnosis present

## 2022-04-09 DIAGNOSIS — R519 Headache, unspecified: Secondary | ICD-10-CM | POA: Diagnosis not present

## 2022-04-09 DIAGNOSIS — E876 Hypokalemia: Secondary | ICD-10-CM | POA: Diagnosis not present

## 2022-04-09 DIAGNOSIS — Z681 Body mass index (BMI) 19 or less, adult: Secondary | ICD-10-CM | POA: Diagnosis not present

## 2022-04-09 DIAGNOSIS — E782 Mixed hyperlipidemia: Secondary | ICD-10-CM | POA: Diagnosis not present

## 2022-04-09 DIAGNOSIS — R748 Abnormal levels of other serum enzymes: Secondary | ICD-10-CM | POA: Diagnosis present

## 2022-04-09 DIAGNOSIS — I700 Atherosclerosis of aorta: Secondary | ICD-10-CM | POA: Diagnosis not present

## 2022-04-09 DIAGNOSIS — S42202A Unspecified fracture of upper end of left humerus, initial encounter for closed fracture: Secondary | ICD-10-CM | POA: Diagnosis not present

## 2022-04-09 DIAGNOSIS — I252 Old myocardial infarction: Secondary | ICD-10-CM

## 2022-04-09 DIAGNOSIS — Z7902 Long term (current) use of antithrombotics/antiplatelets: Secondary | ICD-10-CM

## 2022-04-09 DIAGNOSIS — Z833 Family history of diabetes mellitus: Secondary | ICD-10-CM

## 2022-04-09 DIAGNOSIS — Y92009 Unspecified place in unspecified non-institutional (private) residence as the place of occurrence of the external cause: Secondary | ICD-10-CM

## 2022-04-09 DIAGNOSIS — R079 Chest pain, unspecified: Secondary | ICD-10-CM | POA: Diagnosis not present

## 2022-04-09 DIAGNOSIS — E871 Hypo-osmolality and hyponatremia: Principal | ICD-10-CM | POA: Diagnosis present

## 2022-04-09 DIAGNOSIS — S0003XA Contusion of scalp, initial encounter: Secondary | ICD-10-CM | POA: Diagnosis not present

## 2022-04-09 DIAGNOSIS — Z79811 Long term (current) use of aromatase inhibitors: Secondary | ICD-10-CM | POA: Diagnosis not present

## 2022-04-09 DIAGNOSIS — Z79899 Other long term (current) drug therapy: Secondary | ICD-10-CM | POA: Diagnosis not present

## 2022-04-09 DIAGNOSIS — W109XXA Fall (on) (from) unspecified stairs and steps, initial encounter: Secondary | ICD-10-CM | POA: Diagnosis present

## 2022-04-09 DIAGNOSIS — S0101XA Laceration without foreign body of scalp, initial encounter: Secondary | ICD-10-CM | POA: Diagnosis present

## 2022-04-09 DIAGNOSIS — E878 Other disorders of electrolyte and fluid balance, not elsewhere classified: Secondary | ICD-10-CM | POA: Diagnosis present

## 2022-04-09 DIAGNOSIS — M4312 Spondylolisthesis, cervical region: Secondary | ICD-10-CM | POA: Diagnosis not present

## 2022-04-09 DIAGNOSIS — Z801 Family history of malignant neoplasm of trachea, bronchus and lung: Secondary | ICD-10-CM

## 2022-04-09 DIAGNOSIS — S0990XA Unspecified injury of head, initial encounter: Secondary | ICD-10-CM | POA: Diagnosis not present

## 2022-04-09 DIAGNOSIS — Z23 Encounter for immunization: Secondary | ICD-10-CM | POA: Diagnosis not present

## 2022-04-09 DIAGNOSIS — I5032 Chronic diastolic (congestive) heart failure: Secondary | ICD-10-CM | POA: Diagnosis present

## 2022-04-09 DIAGNOSIS — S199XXA Unspecified injury of neck, initial encounter: Secondary | ICD-10-CM | POA: Diagnosis not present

## 2022-04-09 DIAGNOSIS — J439 Emphysema, unspecified: Secondary | ICD-10-CM | POA: Diagnosis not present

## 2022-04-09 DIAGNOSIS — N179 Acute kidney failure, unspecified: Secondary | ICD-10-CM | POA: Diagnosis present

## 2022-04-09 DIAGNOSIS — M810 Age-related osteoporosis without current pathological fracture: Secondary | ICD-10-CM | POA: Diagnosis not present

## 2022-04-09 DIAGNOSIS — M25512 Pain in left shoulder: Secondary | ICD-10-CM | POA: Diagnosis not present

## 2022-04-09 DIAGNOSIS — Z923 Personal history of irradiation: Secondary | ICD-10-CM | POA: Diagnosis not present

## 2022-04-09 DIAGNOSIS — I2510 Atherosclerotic heart disease of native coronary artery without angina pectoris: Secondary | ICD-10-CM | POA: Diagnosis present

## 2022-04-09 DIAGNOSIS — R55 Syncope and collapse: Secondary | ICD-10-CM

## 2022-04-09 DIAGNOSIS — Z8249 Family history of ischemic heart disease and other diseases of the circulatory system: Secondary | ICD-10-CM

## 2022-04-09 DIAGNOSIS — D509 Iron deficiency anemia, unspecified: Secondary | ICD-10-CM | POA: Diagnosis not present

## 2022-04-09 DIAGNOSIS — R42 Dizziness and giddiness: Secondary | ICD-10-CM | POA: Diagnosis not present

## 2022-04-09 DIAGNOSIS — S0083XA Contusion of other part of head, initial encounter: Secondary | ICD-10-CM | POA: Diagnosis not present

## 2022-04-09 DIAGNOSIS — E785 Hyperlipidemia, unspecified: Secondary | ICD-10-CM | POA: Diagnosis present

## 2022-04-09 DIAGNOSIS — K219 Gastro-esophageal reflux disease without esophagitis: Secondary | ICD-10-CM | POA: Diagnosis present

## 2022-04-09 DIAGNOSIS — M25519 Pain in unspecified shoulder: Secondary | ICD-10-CM | POA: Diagnosis not present

## 2022-04-09 DIAGNOSIS — I6523 Occlusion and stenosis of bilateral carotid arteries: Secondary | ICD-10-CM | POA: Diagnosis not present

## 2022-04-09 DIAGNOSIS — E860 Dehydration: Secondary | ICD-10-CM | POA: Diagnosis not present

## 2022-04-09 DIAGNOSIS — R609 Edema, unspecified: Secondary | ICD-10-CM | POA: Diagnosis not present

## 2022-04-09 LAB — I-STAT CHEM 8, ED
BUN: 17 mg/dL (ref 8–23)
Calcium, Ion: 1.07 mmol/L — ABNORMAL LOW (ref 1.15–1.40)
Chloride: 79 mmol/L — ABNORMAL LOW (ref 98–111)
Creatinine, Ser: 0.9 mg/dL (ref 0.44–1.00)
Glucose, Bld: 133 mg/dL — ABNORMAL HIGH (ref 70–99)
HCT: 32 % — ABNORMAL LOW (ref 36.0–46.0)
Hemoglobin: 10.9 g/dL — ABNORMAL LOW (ref 12.0–15.0)
Potassium: 2.9 mmol/L — ABNORMAL LOW (ref 3.5–5.1)
Sodium: 116 mmol/L — CL (ref 135–145)
TCO2: 26 mmol/L (ref 22–32)

## 2022-04-09 LAB — SAMPLE TO BLOOD BANK

## 2022-04-09 LAB — COMPREHENSIVE METABOLIC PANEL
ALT: 15 U/L (ref 0–44)
AST: 34 U/L (ref 15–41)
Albumin: 4 g/dL (ref 3.5–5.0)
Alkaline Phosphatase: 45 U/L (ref 38–126)
Anion gap: 16 — ABNORMAL HIGH (ref 5–15)
BUN: 17 mg/dL (ref 8–23)
CO2: 23 mmol/L (ref 22–32)
Calcium: 8.9 mg/dL (ref 8.9–10.3)
Chloride: 80 mmol/L — ABNORMAL LOW (ref 98–111)
Creatinine, Ser: 0.99 mg/dL (ref 0.44–1.00)
GFR, Estimated: >60 mL/min (ref >60)
Glucose, Bld: 132 mg/dL — ABNORMAL HIGH (ref 70–99)
Potassium: 2.9 mmol/L — ABNORMAL LOW (ref 3.5–5.1)
Sodium: 119 mmol/L — CL (ref 135–145)
Total Bilirubin: 0.6 mg/dL (ref 0.3–1.2)
Total Protein: 6.3 g/dL — ABNORMAL LOW (ref 6.5–8.1)

## 2022-04-09 LAB — CBC
HCT: 28.4 % — ABNORMAL LOW (ref 36.0–46.0)
Hemoglobin: 10.4 g/dL — ABNORMAL LOW (ref 12.0–15.0)
MCH: 33.8 pg (ref 26.0–34.0)
MCHC: 36.6 g/dL — ABNORMAL HIGH (ref 30.0–36.0)
MCV: 92.2 fL (ref 80.0–100.0)
Platelets: 180 10*3/uL (ref 150–400)
RBC: 3.08 MIL/uL — ABNORMAL LOW (ref 3.87–5.11)
RDW: 12.9 % (ref 11.5–15.5)
WBC: 7.3 10*3/uL (ref 4.0–10.5)
nRBC: 0 % (ref 0.0–0.2)

## 2022-04-09 LAB — LACTIC ACID, PLASMA: Lactic Acid, Venous: 1.3 mmol/L (ref 0.5–1.9)

## 2022-04-09 LAB — CK: Total CK: 631 U/L — ABNORMAL HIGH (ref 38–234)

## 2022-04-09 LAB — PROTIME-INR
INR: 1 (ref 0.8–1.2)
Prothrombin Time: 12.7 seconds (ref 11.4–15.2)

## 2022-04-09 LAB — ETHANOL: Alcohol, Ethyl (B): <10 mg/dL (ref <10)

## 2022-04-09 MED ORDER — NALOXONE HCL 0.4 MG/ML IJ SOLN: 0.4000 mg | INTRAMUSCULAR | Status: DC | PRN

## 2022-04-09 MED ORDER — LACTATED RINGERS IV SOLN: INTRAVENOUS | Status: DC

## 2022-04-09 MED ORDER — ACETAMINOPHEN 650 MG RE SUPP: 650.0000 mg | Freq: Four times a day (QID) | RECTAL | Status: DC | PRN

## 2022-04-09 MED ORDER — HYDROCODONE-ACETAMINOPHEN 5-325 MG PO TABS
1.0000 | ORAL_TABLET | Freq: Four times a day (QID) | ORAL | Status: DC | PRN
  Administered 2022-04-10 – 2022-04-11 (×3): 1 via ORAL
  Filled 2022-04-09 (×3): qty 1

## 2022-04-09 MED ORDER — TETANUS-DIPHTH-ACELL PERTUSSIS 5-2.5-18.5 LF-MCG/0.5 IM SUSY
0.5000 mL | PREFILLED_SYRINGE | Freq: Once | INTRAMUSCULAR | Status: AC
  Administered 2022-04-09: 0.5 mL via INTRAMUSCULAR
  Filled 2022-04-09: qty 0.5

## 2022-04-09 MED ORDER — ACETAMINOPHEN 325 MG PO TABS: 650.0000 mg | ORAL_TABLET | Freq: Four times a day (QID) | ORAL | Status: DC | PRN

## 2022-04-09 MED ORDER — SODIUM CHLORIDE 0.9 % IV BOLUS
500.0000 mL | Freq: Once | INTRAVENOUS | Status: AC
  Administered 2022-04-09: 500 mL via INTRAVENOUS

## 2022-04-09 NOTE — ED Triage Notes (Signed)
Pt BIB [facility] from home, pt had a fall down 2-3 stairs while letting her dog out tonight. Reports feeling dizzy prior to fall, c/o intermittent dizziness x 5 days. Denies LOC, GCS 15, c-collar placed pta, given 158mg fentanyl by EMS.

## 2022-04-09 NOTE — TOC CAGE-AID Note (Signed)
Transition of Care [facility]) - CAGE-AID Screening   Patient Details  Name: [name] MRN: [id number] Date of Birth: 1951/01/26  Transition of Care [facility]) CM/SW Contact:    Army Melia, RN Phone [phone] 04/09/2022, 11:16 PM   Clinical Narrative: Presents after a ground level fall resulting in left shoulder fx and posterior head lac. Reports no drug or alcohol use, no resources indicated.   CAGE-AID Screening:    Have You Ever Felt You Ought to Cut Down on Your Drinking or Drug Use?: No Have People Annoyed You By Critizing Your Drinking Or Drug Use?: No Have You Felt Bad Or Guilty About Your Drinking Or Drug Use?: No Have You Ever Had a Drink or Used Drugs First Thing In The Morning to Steady Your Nerves or to Get Rid of a Hangover?: No CAGE-AID Score: 0  Substance Abuse Education Offered: No

## 2022-04-09 NOTE — ED Notes (Signed)
Trauma Response Nurse Documentation   [name] is a 71 y.o. female arriving to [facility] ED via EMS  On clopidogrel 75 mg daily. Trauma was activated as a Level 2 by ED charge RN based on the following trauma criteria Elderly patients > 65 with head trauma on anti-coagulation (excluding ASA). Trauma team at the bedside on patient arrival.   Patient cleared for CT by Dr. Sherry Ruffing EDP. Pt transported to CT with trauma response nurse present to monitor. RN remained with the patient throughout their absence from the department for clinical observation.   GCS 15.  History   Past Medical History:  Diagnosis Date   ABDOMINAL AORTIC ANEURYSM    Anemia    Arthritis    HANDS   Blood transfusion without reported diagnosis    AUGUST 5TH 2017 x2    Breast cancer ([place])    CAD    CONGESTIVE HEART FAILURE, SYSTOLIC, CHRONIC    CT, CHEST, ABNORMAL    GERD (gastroesophageal reflux disease)    Heart attack ([place])    x 2   History of colon polyps 01/05/2014   adenomatous polyps   HYPERLIPIDEMIA    HYPERTENSION    ISCHEMIC CARDIOMYOPATHY    Lower GI bleed 01/2014   NONSPECIFIC ABN FINDNG RAD&OTH EXAM BILARY TRCT    Numbness and tingling    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   Paresthesia of both hands    TOBACCO ABUSE      Past Surgical History:  Procedure Laterality Date   bare metal stent placement     left anterior descending artery x 2   COLONOSCOPY N/A 01/05/2014   Procedure: COLONOSCOPY;  Surgeon: Jerene Bears, MD;  Location: [facility];  Service: Endoscopy;  Laterality: N/A;   COLONOSCOPY N/A 01/06/2014   Procedure: COLONOSCOPY;  Surgeon: Jerene Bears, MD;  Location: [facility];  Service: Endoscopy;  Laterality: N/A;   COLONOSCOPY     POLYPECTOMY         Initial Focused Assessment (If applicable, or please see trauma documentation): Alert/oriented female presents from home via EMS after a fall resulting in posterior head hematoma, reports dizziness x5  days Airway patent/unobstructed, BS clear No obvious uncontrolled hemorrhage GCS 15 PERRLA 58m  CT's Completed:   CT Head and CT C-Spine   Interventions:  Trauma lab draw Portable chest and left shoulder XRAY CT head and cervical spine NS bolus  CAGE AID  Plan for disposition:  Admit anticipated d/t hyponatremia  Consults completed:  Hospitalist anticipated  Event Summary: Patient presents via EMS from home after a fall resulting in posterior head hematoma, left shoulder pain, left elbow skin tear. Some bruising to bilateral lower legs. Intermittent dizziness x5 days, states she was dizzy prior to fall. Assisted with primary/secondary assessment and escorted to CT. Offered to call family, pt declined. Chem 8 significant for hyponatremia 118, admission anticipated.   MTP Summary (If applicable): NA  Bedside handoff with ED RN Callie.    Mariane Burpee O Ashar Lewinski  Trauma Response RN  Please call TRN at [phone] further assistance.

## 2022-04-09 NOTE — H&P (Signed)
History and Physical    PLEASE NOTE THAT DRAGON DICTATION SOFTWARE WAS USED IN THE CONSTRUCTION OF THIS NOTE.   [name] EYC:[id number] DOB: 06/15/1950 DOA: 04/09/2022  PCP: Reynold Bowen, MD *** Patient coming from: home ***  I have personally briefly reviewed patient's old medical records in [place]  Chief Complaint: ***  HPI: [name] is a 71 y.o. female with medical history significant for *** who is admitted to [facility] on 04/09/2022 with *** after presenting from home*** to [facility] ED complaining of ***.    ***       ***   ED Course:  Vital signs in the ED were notable for the following: ***  Labs were notable for the following: ***  Imaging and additional notable ED work-up: ***  While in the ED, the following were administered: ***  Subsequently, the patient was admitted  ***  ***red    Review of Systems: As per HPI otherwise 10 point review of systems negative.   Past Medical History:  Diagnosis Date   ABDOMINAL AORTIC ANEURYSM    Anemia    Arthritis    HANDS   Blood transfusion without reported diagnosis    AUGUST 5TH 2017 x2    Breast cancer ([place])    CAD    CONGESTIVE HEART FAILURE, SYSTOLIC, CHRONIC    CT, CHEST, ABNORMAL    GERD (gastroesophageal reflux disease)    Heart attack ([place])    x 2   History of colon polyps 01/05/2014   adenomatous polyps   HYPERLIPIDEMIA    HYPERTENSION    ISCHEMIC CARDIOMYOPATHY    Lower GI bleed 01/2014   NONSPECIFIC ABN FINDNG RAD&OTH EXAM BILARY TRCT    Numbness and tingling    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   Paresthesia of both hands    TOBACCO ABUSE     Past Surgical History:  Procedure Laterality Date   bare metal stent placement     left anterior descending artery x 2   COLONOSCOPY N/A 01/05/2014   Procedure: COLONOSCOPY;  Surgeon: Jerene Bears, MD;  Location: [facility] ENDOSCOPY;  Service: Endoscopy;  Laterality: N/A;   COLONOSCOPY N/A 01/06/2014    Procedure: COLONOSCOPY;  Surgeon: Jerene Bears, MD;  Location: [facility] ENDOSCOPY;  Service: Endoscopy;  Laterality: N/A;   COLONOSCOPY     POLYPECTOMY      Social History:  reports that she quit smoking about 10 years ago. Her smoking use included cigarettes and e-cigarettes. She has never been exposed to tobacco smoke. She has never used smokeless tobacco. She reports that she does not currently use alcohol. She reports that she does not use drugs.   No Known Allergies  Family History  Problem Relation Age of Onset   Lymphoma Mother 77   Colon polyps Father    Depression Father 40       died of suicide   Heart attack Father    Diabetes Maternal Grandmother    Anemia Sister    Colon polyps Sister    Lung cancer Sister    Colon cancer Neg Hx    Esophageal cancer Neg Hx    Rectal cancer Neg Hx    Stomach cancer Neg Hx     Family history reviewed and not pertinent ***   Prior to Admission medications   Medication Sig Start Date End Date Taking? Authorizing Provider  acetaminophen (TYLENOL) 325 MG tablet Take 2 tablets (650 mg total) by mouth  every 6 (six) hours as needed for mild pain (or Fever >/= 101). 08/30/21   Phillips Grout, MD  clopidogrel (PLAVIX) 75 MG tablet TAKE 1 TABLET BY MOUTH EVERY DAY 06/26/21   Josue Hector, MD  cyanocobalamin (VITAMIN B12) 1000 MCG/ML injection Inject 1,000 mcg into the muscle every 30 (thirty) days. 02/14/22   [provider]  docusate sodium (COLACE) 100 MG capsule Take 100 mg by mouth daily as needed for mild constipation.    [provider]  gabapentin (NEURONTIN) 300 MG capsule Take 1 capsule (300 mg total) by mouth 2 (two) times daily. Patient taking differently: Take 600 mg by mouth 2 (two) times daily. 01/17/20   Narda Amber K, DO  iron polysaccharides (NIFEREX) 150 MG capsule Take 150 mg by mouth daily.    [provider]  letrozole (FEMARA) 2.5 MG tablet Take 2.5 mg by mouth daily. 08/05/21   [provider]  loperamide (IMODIUM) 2 MG capsule Take 2 mg by mouth daily as needed for diarrhea or loose stools.    [provider]  Neratinib Maleate (NERLYNX) 40 MG tablet Take 240 mg by mouth daily. 6 tablets = 240 mg 09/26/21   [provider]  pantoprazole (PROTONIX) 20 MG tablet Take 20 mg by mouth daily as needed for heartburn or indigestion. 12/21/19   [provider]  simvastatin (ZOCOR) 40 MG tablet TAKE 1 TABLET(40 MG) BY MOUTH DAILY AT 6 PM 04/11/21   Josue Hector, MD  traMADol (ULTRAM) 50 MG tablet Take 1 tablet (50 mg total) by mouth every 12 (twelve) hours as needed. 10/17/21   Marybelle Killings, MD  Vitamin D, Cholecalciferol, 25 MCG (1000 UT) CAPS Take 2,000 Units by mouth daily.    [provider]     Objective    Physical Exam: Vitals:   04/09/22 2100 04/09/22 2158  BP:  (!) 148/77  Pulse:  76  Resp:  16  SpO2:  96%  Weight: 42.6 kg   Height: '5\' 4"'$  (1.626 m)     General: appears to be stated age; alert, oriented Skin: warm, dry, no rash Head:  AT/[place] Mouth:  Oral mucosa membranes appear moist, normal dentition Neck: supple; trachea midline Heart:  RRR; did not appreciate any M/R/G Lungs: CTAB, did not appreciate any wheezes, rales, or rhonchi Abdomen: + BS; soft, ND, NT Vascular: 2+ pedal pulses b/l; 2+ radial pulses b/l Extremities: no peripheral edema, no muscle wasting Neuro: strength and sensation intact in upper and lower extremities b/l ***   *** Neuro: 5/5 strength of the proximal and distal flexors and extensors of the upper and lower extremities bilaterally; sensation intact in upper and lower extremities b/l; cranial nerves II through XII grossly intact; no pronator drift; no evidence suggestive of slurred speech, dysarthria, or facial droop; Normal muscle tone. No tremors.  *** Neuro: In the setting of the patient's current mental status and associated inability to follow instructions, unable to perform full neurologic exam  at this time.  As such, assessment of strength, sensation, and cranial nerves is limited at this time. Patient noted to spontaneously move all 4 extremities. No tremors.  ***    Labs on Admission: I have personally reviewed following labs and imaging studies  CBC: Recent Labs  Lab 04/09/22 2105 04/09/22 2114  WBC 7.3  --   HGB 10.4* 10.9*  HCT 28.4* 32.0*  MCV 92.2  --   PLT 180  --    Basic Metabolic  Panel: Recent Labs  Lab 04/09/22 2105 04/09/22 2114  NA 119* 116*  K 2.9* 2.9*  CL 80* 79*  CO2 23  --   GLUCOSE 132* 133*  BUN 17 17  CREATININE 0.99 0.90  CALCIUM 8.9  --    GFR: Estimated Creatinine Clearance: 39.1 mL/min (by C-G formula based on SCr of 0.9 mg/dL). Liver Function Tests: Recent Labs  Lab 04/09/22 2105  AST 34  ALT 15  ALKPHOS 45  BILITOT 0.6  PROT 6.3*  ALBUMIN 4.0   No results for input(s): "LIPASE", "AMYLASE" in the last 168 hours. No results for input(s): "AMMONIA" in the last 168 hours. Coagulation Profile: Recent Labs  Lab 04/09/22 2105  INR 1.0   Cardiac Enzymes: Recent Labs  Lab 04/09/22 2105  CKTOTAL 631*   BNP (last 3 results) No results for input(s): "PROBNP" in the last 8760 hours. HbA1C: No results for input(s): "HGBA1C" in the last 72 hours. CBG: No results for input(s): "GLUCAP" in the last 168 hours. Lipid Profile: No results for input(s): "CHOL", "HDL", "LDLCALC", "TRIG", "CHOLHDL", "LDLDIRECT" in the last 72 hours. Thyroid Function Tests: No results for input(s): "TSH", "T4TOTAL", "FREET4", "T3FREE", "THYROIDAB" in the last 72 hours. Anemia Panel: No results for input(s): "VITAMINB12", "FOLATE", "FERRITIN", "TIBC", "IRON", "RETICCTPCT" in the last 72 hours. Urine analysis:    Component Value Date/Time   COLORURINE YELLOW 10/03/2021 0035   APPEARANCEUR CLEAR 10/03/2021 0035   LABSPEC 1.013 10/03/2021 0035   PHURINE 5.0 10/03/2021 0035   GLUCOSEU NEGATIVE 10/03/2021 0035   HGBUR NEGATIVE 10/03/2021 0035    BILIRUBINUR NEGATIVE 10/03/2021 0035   KETONESUR NEGATIVE 10/03/2021 0035   PROTEINUR 30 (A) 10/03/2021 0035   NITRITE NEGATIVE 10/03/2021 0035   LEUKOCYTESUR NEGATIVE 10/03/2021 0035    Radiological Exams on Admission: CT HEAD WO CONTRAST  Result Date: 04/09/2022 CLINICAL DATA:  Trauma. EXAM: CT HEAD WITHOUT CONTRAST CT CERVICAL SPINE WITHOUT CONTRAST TECHNIQUE: Multidetector CT imaging of the head and cervical spine was performed following the standard protocol without intravenous contrast. Multiplanar CT image reconstructions of the cervical spine were also generated. RADIATION DOSE REDUCTION: This exam was performed according to the departmental dose-optimization program which includes automated exposure control, adjustment of the mA and/or kV according to patient size and/or use of iterative reconstruction technique. COMPARISON:  CT of the head and C-spine dated 08/27/2021. FINDINGS: CT HEAD FINDINGS Brain: The ventricles and sulci are appropriate size for the patient's age. The gray-white matter discrimination is preserved. There is no acute intracranial hemorrhage. No mass effect or midline shift. No extra-axial fluid collection. Vascular: No hyperdense vessel or unexpected calcification. Skull: Normal. Negative for fracture or focal lesion. Sinuses/Orbits: No acute finding. Other: Mild left forehead contusion and midline occipital scalp contusion. CT CERVICAL SPINE FINDINGS Alignment: No acute subluxation.  Grade 1 C4-C5 anterolisthesis. Skull base and vertebrae: No acute fracture.  Osteopenia. Soft tissues and spinal canal: No prevertebral fluid or swelling. No visible canal hematoma. Disc levels:  No acute findings. Upper chest: Emphysema. Other: Partially visualized right-sided Port-A-Cath. Bilateral carotid bulb calcified plaques. IMPRESSION: 1. No acute intracranial pathology. 2. No acute/traumatic cervical spine pathology. Electronically Signed   By: Anner Crete M.D.   On: 04/09/2022  21:43   CT CERVICAL SPINE WO CONTRAST  Result Date: 04/09/2022 CLINICAL DATA:  Trauma. EXAM: CT HEAD WITHOUT CONTRAST CT CERVICAL SPINE WITHOUT CONTRAST TECHNIQUE: Multidetector CT imaging of the head and cervical spine was performed following the standard protocol without intravenous contrast. Multiplanar CT image  reconstructions of the cervical spine were also generated. RADIATION DOSE REDUCTION: This exam was performed according to the departmental dose-optimization program which includes automated exposure control, adjustment of the mA and/or kV according to patient size and/or use of iterative reconstruction technique. COMPARISON:  CT of the head and C-spine dated 08/27/2021. FINDINGS: CT HEAD FINDINGS Brain: The ventricles and sulci are appropriate size for the patient's age. The gray-white matter discrimination is preserved. There is no acute intracranial hemorrhage. No mass effect or midline shift. No extra-axial fluid collection. Vascular: No hyperdense vessel or unexpected calcification. Skull: Normal. Negative for fracture or focal lesion. Sinuses/Orbits: No acute finding. Other: Mild left forehead contusion and midline occipital scalp contusion. CT CERVICAL SPINE FINDINGS Alignment: No acute subluxation.  Grade 1 C4-C5 anterolisthesis. Skull base and vertebrae: No acute fracture.  Osteopenia. Soft tissues and spinal canal: No prevertebral fluid or swelling. No visible canal hematoma. Disc levels:  No acute findings. Upper chest: Emphysema. Other: Partially visualized right-sided Port-A-Cath. Bilateral carotid bulb calcified plaques. IMPRESSION: 1. No acute intracranial pathology. 2. No acute/traumatic cervical spine pathology. Electronically Signed   By: Anner Crete M.D.   On: 04/09/2022 21:43   DG Shoulder Left Port  Result Date: 04/09/2022 CLINICAL DATA:  Dizziness, fall down stairs, left shoulder pain. EXAM: LEFT SHOULDER COMPARISON:  None Available. FINDINGS: Acute surgical neck fracture  of the left proximal humerus with varus angulation mild comminution along the medial metaphysis. This is most likely a Neer 2 part fracture given the degree of angulation although CT could be utilized for definitive characterization. IMPRESSION: 1. Acute surgical neck fracture of the left proximal humerus with varus angulation and mild comminution. Electronically Signed   By: Van Clines M.D.   On: 04/09/2022 21:37   DG Chest Port 1 View  Result Date: 04/09/2022 CLINICAL DATA:  Fall down 2-3 stairs proceeded by dizziness. Blunt trauma, left shoulder/chest pain. EXAM: PORTABLE CHEST 1 VIEW COMPARISON:  Multiple exams, including 10/02/2021 and 01/14/2022 FINDINGS: Power injectable right Port-A-Cath tip: SVC. Atherosclerotic calcification of the aortic arch. Heart size within normal limits. Tapering of the peripheral pulmonary vasculature favors emphysema. No blunting of the costophrenic angles. Clips along the left breast. Chronic deformity of the right proximal humerus compatible with a prior fracture. Acute deformity of the left proximal humerus compatible with acute left surgical neck fracture. IMPRESSION: 1. Acute surgical neck fracture the left proximal humerus. 2. Chronic deformity of the right proximal humerus compatible with a prior fracture. 3. Emphysema. 4. Atherosclerotic calcification of the aortic arch. Aortic Atherosclerosis (ICD10-I70.0) and Emphysema (ICD10-J43.9). Electronically Signed   By: Van Clines M.D.   On: 04/09/2022 21:35     EKG: Independently reviewed, with result as described above. ***   Assessment/Plan   Principal Problem:   Acute hyponatremia   ***       ***            ***             ***            ***            ***            ***           ***   ***  DVT prophylaxis: SCD's ***  Code Status: Full code*** Family Communication: none*** Disposition Plan: Per Rounding  Team Consults called: none***;  Admission status: ***    PLEASE NOTE THAT DRAGON DICTATION SOFTWARE WAS USED  IN THE CONSTRUCTION OF THIS NOTE.   [place] DO [facility]  From [place]   04/09/2022, 11:00 PM   ***

## 2022-04-09 NOTE — Progress Notes (Signed)
Orthopedic Tech Progress Note Patient Details:  ARVIS MIGUEZ 07-23-1950 [id number] Level 2 trauma Ortho Devices Type of Ortho Device: Sling immobilizer Ortho Device/Splint Location: LUE Ortho Device/Splint Interventions: Ordered, Application, Adjustment   Post Interventions Patient Tolerated: Well Instructions Provided: Adjustment of device, Care of device, Poper ambulation with device  Jakobi Thetford 04/09/2022, 10:16 PM

## 2022-04-09 NOTE — ED Provider Notes (Signed)
[facility] EMERGENCY DEPARTMENT Provider Note   CSN: [id number] Arrival date & time: 04/09/22  2055     History  Chief Complaint  Patient presents with   [name]    [name] is a 71 y.o. female.  The history is provided by the patient and medical records. No language interpreter was used.  Fall This is a new problem. The current episode started 1 to 2 hours ago. The problem has not changed since onset.Associated symptoms include headaches. Pertinent negatives include no chest pain, no abdominal pain and no shortness of breath. Nothing aggravates the symptoms. Nothing relieves the symptoms. She has tried nothing for the symptoms. The treatment provided no relief.        Home Medications Prior to Admission medications   Medication Sig Start Date End Date Taking? Authorizing Provider  acetaminophen (TYLENOL) 325 MG tablet Take 2 tablets (650 mg total) by mouth every 6 (six) hours as needed for mild pain (or Fever >/= 101). 08/30/21   Phillips Grout, MD  clopidogrel (PLAVIX) 75 MG tablet TAKE 1 TABLET BY MOUTH EVERY DAY 06/26/21   Josue Hector, MD  cyanocobalamin (VITAMIN B12) 1000 MCG/ML injection Inject 1,000 mcg into the muscle every 30 (thirty) days. 02/14/22   [provider]  docusate sodium (COLACE) 100 MG capsule Take 100 mg by mouth daily as needed for mild constipation.    [provider]  gabapentin (NEURONTIN) 300 MG capsule Take 1 capsule (300 mg total) by mouth 2 (two) times daily. Patient taking differently: Take 600 mg by mouth 2 (two) times daily. 01/17/20   Narda Amber K, DO  iron polysaccharides (NIFEREX) 150 MG capsule Take 150 mg by mouth daily.    [provider]  letrozole (FEMARA) 2.5 MG tablet Take 2.5 mg by mouth daily. 08/05/21   [provider]  loperamide (IMODIUM) 2 MG capsule Take 2 mg by mouth daily as needed for diarrhea or loose stools.    [provider]  Neratinib Maleate (NERLYNX)  40 MG tablet Take 240 mg by mouth daily. 6 tablets = 240 mg 09/26/21   [provider]  pantoprazole (PROTONIX) 20 MG tablet Take 20 mg by mouth daily as needed for heartburn or indigestion. 12/21/19   [provider]  simvastatin (ZOCOR) 40 MG tablet TAKE 1 TABLET(40 MG) BY MOUTH DAILY AT 6 PM 04/11/21   Josue Hector, MD  traMADol (ULTRAM) 50 MG tablet Take 1 tablet (50 mg total) by mouth every 12 (twelve) hours as needed. 10/17/21   Marybelle Killings, MD  Vitamin D, Cholecalciferol, 25 MCG (1000 UT) CAPS Take 2,000 Units by mouth daily.    [provider]      Allergies    Patient has no known allergies.    Review of Systems   Review of Systems  Constitutional:  Negative for chills, fatigue and fever.  HENT:  Negative for congestion.   Eyes:  Negative for visual disturbance.  Respiratory:  Negative for cough, chest tightness, shortness of breath and wheezing.   Cardiovascular:  Negative for chest pain, palpitations and leg swelling.  Gastrointestinal:  Negative for abdominal pain, constipation, diarrhea, nausea and vomiting.  Genitourinary:  Negative for dysuria and flank pain.  Musculoskeletal:  Negative for back pain, neck pain and neck stiffness.  Skin:  Positive for wound. Negative for rash.  Neurological:  Positive for light-headedness and headaches. Negative for facial asymmetry, weakness and numbness.  Psychiatric/Behavioral:  Negative for  agitation and confusion.   All other systems reviewed and are negative.   Physical Exam Updated Vital Signs BP (!) 148/77   Pulse 76   Temp (!) 97.5 F (36.4 C) (Temporal)   Resp 16   Ht '5\' 4"'$  (1.626 m)   Wt 42.6 kg   SpO2 96%   BMI 16.14 kg/m  Physical Exam Vitals and nursing note reviewed.  Constitutional:      General: She is not in acute distress.    Appearance: She is well-developed. She is not ill-appearing, toxic-appearing or diaphoretic.  HENT:     Head:      Comments: Small laceration to back of  head.  It was washed and cleaned and 2 staples placed.    Nose: No congestion or rhinorrhea.     Mouth/Throat:     Mouth: Mucous membranes are moist.     Pharynx: No oropharyngeal exudate or posterior oropharyngeal erythema.  Eyes:     Extraocular Movements: Extraocular movements intact.     Conjunctiva/sclera: Conjunctivae normal.     Pupils: Pupils are equal, round, and reactive to light.  Cardiovascular:     Rate and Rhythm: Normal rate and regular rhythm.     Heart sounds: No murmur heard. Pulmonary:     Effort: Pulmonary effort is normal. No respiratory distress.     Breath sounds: Normal breath sounds.  Abdominal:     Palpations: Abdomen is soft.     Tenderness: There is no abdominal tenderness. There is no guarding or rebound.  Musculoskeletal:        General: Tenderness present. No swelling.       Arms:     Cervical back: Neck supple. No tenderness.     Right lower leg: No edema.     Left lower leg: No edema.     Comments: Tenderness to left shoulder.  Swelling.  No tenderness to the wrist or elbow.  Intact sensation, strength, pulses in arms.  Skin:    General: Skin is warm and dry.     Capillary Refill: Capillary refill takes less than 2 seconds.     Findings: No erythema or rash.  Neurological:     General: No focal deficit present.     Mental Status: She is alert.     Sensory: No sensory deficit.     Motor: No weakness.  Psychiatric:        Mood and Affect: Mood normal.     ED Results / Procedures / Treatments   Labs (all labs ordered are listed, but only abnormal results are displayed) Labs Reviewed  COMPREHENSIVE METABOLIC PANEL - Abnormal; Notable for the following components:      Result Value   Sodium 119 (*)    Potassium 2.9 (*)    Chloride 80 (*)    Glucose, Bld 132 (*)    Total Protein 6.3 (*)    Anion gap 16 (*)    All other components within normal limits  CBC - Abnormal; Notable for the following components:   RBC 3.08 (*)    Hemoglobin  10.4 (*)    HCT 28.4 (*)    MCHC 36.6 (*)    All other components within normal limits  CK - Abnormal; Notable for the following components:   Total CK 631 (*)    All other components within normal limits  I-STAT CHEM 8, ED - Abnormal; Notable for the following components:   Sodium 116 (*)    Potassium 2.9 (*)  Chloride 79 (*)    Glucose, Bld 133 (*)    Calcium, Ion 1.07 (*)    Hemoglobin 10.9 (*)    HCT 32.0 (*)    All other components within normal limits  URINE CULTURE  ETHANOL  LACTIC ACID, PLASMA  PROTIME-INR  URINALYSIS, ROUTINE W REFLEX MICROSCOPIC  CBC WITH DIFFERENTIAL/PLATELET  COMPREHENSIVE METABOLIC PANEL  MAGNESIUM  MAGNESIUM  BASIC METABOLIC PANEL  SODIUM, URINE, RANDOM  CREATININE, URINE, RANDOM  OSMOLALITY, URINE  OSMOLALITY  TSH  URIC ACID  SAMPLE TO BLOOD BANK    EKG None  Radiology CT HEAD WO CONTRAST  Result Date: 04/09/2022 CLINICAL DATA:  Trauma. EXAM: CT HEAD WITHOUT CONTRAST CT CERVICAL SPINE WITHOUT CONTRAST TECHNIQUE: Multidetector CT imaging of the head and cervical spine was performed following the standard protocol without intravenous contrast. Multiplanar CT image reconstructions of the cervical spine were also generated. RADIATION DOSE REDUCTION: This exam was performed according to the departmental dose-optimization program which includes automated exposure control, adjustment of the mA and/or kV according to patient size and/or use of iterative reconstruction technique. COMPARISON:  CT of the head and C-spine dated 08/27/2021. FINDINGS: CT HEAD FINDINGS Brain: The ventricles and sulci are appropriate size for the patient's age. The gray-white matter discrimination is preserved. There is no acute intracranial hemorrhage. No mass effect or midline shift. No extra-axial fluid collection. Vascular: No hyperdense vessel or unexpected calcification. Skull: Normal. Negative for fracture or focal lesion. Sinuses/Orbits: No acute finding. Other: Mild  left forehead contusion and midline occipital scalp contusion. CT CERVICAL SPINE FINDINGS Alignment: No acute subluxation.  Grade 1 C4-C5 anterolisthesis. Skull base and vertebrae: No acute fracture.  Osteopenia. Soft tissues and spinal canal: No prevertebral fluid or swelling. No visible canal hematoma. Disc levels:  No acute findings. Upper chest: Emphysema. Other: Partially visualized right-sided Port-A-Cath. Bilateral carotid bulb calcified plaques. IMPRESSION: 1. No acute intracranial pathology. 2. No acute/traumatic cervical spine pathology. Electronically Signed   By: Anner Crete M.D.   On: 04/09/2022 21:43   CT CERVICAL SPINE WO CONTRAST  Result Date: 04/09/2022 CLINICAL DATA:  Trauma. EXAM: CT HEAD WITHOUT CONTRAST CT CERVICAL SPINE WITHOUT CONTRAST TECHNIQUE: Multidetector CT imaging of the head and cervical spine was performed following the standard protocol without intravenous contrast. Multiplanar CT image reconstructions of the cervical spine were also generated. RADIATION DOSE REDUCTION: This exam was performed according to the departmental dose-optimization program which includes automated exposure control, adjustment of the mA and/or kV according to patient size and/or use of iterative reconstruction technique. COMPARISON:  CT of the head and C-spine dated 08/27/2021. FINDINGS: CT HEAD FINDINGS Brain: The ventricles and sulci are appropriate size for the patient's age. The gray-white matter discrimination is preserved. There is no acute intracranial hemorrhage. No mass effect or midline shift. No extra-axial fluid collection. Vascular: No hyperdense vessel or unexpected calcification. Skull: Normal. Negative for fracture or focal lesion. Sinuses/Orbits: No acute finding. Other: Mild left forehead contusion and midline occipital scalp contusion. CT CERVICAL SPINE FINDINGS Alignment: No acute subluxation.  Grade 1 C4-C5 anterolisthesis. Skull base and vertebrae: No acute fracture.   Osteopenia. Soft tissues and spinal canal: No prevertebral fluid or swelling. No visible canal hematoma. Disc levels:  No acute findings. Upper chest: Emphysema. Other: Partially visualized right-sided Port-A-Cath. Bilateral carotid bulb calcified plaques. IMPRESSION: 1. No acute intracranial pathology. 2. No acute/traumatic cervical spine pathology. Electronically Signed   By: Anner Crete M.D.   On: 04/09/2022 21:43   DG Shoulder Left Port  Result Date: 04/09/2022 CLINICAL DATA:  Dizziness, fall down stairs, left shoulder pain. EXAM: LEFT SHOULDER COMPARISON:  None Available. FINDINGS: Acute surgical neck fracture of the left proximal humerus with varus angulation mild comminution along the medial metaphysis. This is most likely a Neer 2 part fracture given the degree of angulation although CT could be utilized for definitive characterization. IMPRESSION: 1. Acute surgical neck fracture of the left proximal humerus with varus angulation and mild comminution. Electronically Signed   By: Van Clines M.D.   On: 04/09/2022 21:37   DG Chest Port 1 View  Result Date: 04/09/2022 CLINICAL DATA:  Fall down 2-3 stairs proceeded by dizziness. Blunt trauma, left shoulder/chest pain. EXAM: PORTABLE CHEST 1 VIEW COMPARISON:  Multiple exams, including 10/02/2021 and 01/14/2022 FINDINGS: Power injectable right Port-A-Cath tip: SVC. Atherosclerotic calcification of the aortic arch. Heart size within normal limits. Tapering of the peripheral pulmonary vasculature favors emphysema. No blunting of the costophrenic angles. Clips along the left breast. Chronic deformity of the right proximal humerus compatible with a prior fracture. Acute deformity of the left proximal humerus compatible with acute left surgical neck fracture. IMPRESSION: 1. Acute surgical neck fracture the left proximal humerus. 2. Chronic deformity of the right proximal humerus compatible with a prior fracture. 3. Emphysema. 4. Atherosclerotic  calcification of the aortic arch. Aortic Atherosclerosis (ICD10-I70.0) and Emphysema (ICD10-J43.9). Electronically Signed   By: Van Clines M.D.   On: 04/09/2022 21:35    Procedures .Marland KitchenLaceration Repair  Date/Time: 04/09/2022 11:58 PM  Performed by: Courtney Paris, MD Authorized by: Courtney Paris, MD   Consent:    Consent obtained:  Verbal   Consent given by:  Patient   Risks, benefits, and alternatives were discussed: yes     Risks discussed:  Infection and pain Universal protocol:    Immediately prior to procedure, a time out was called: yes     Patient identity confirmed:  Verbally with patient and hospital-assigned identification number Anesthesia:    Anesthesia method:  None Laceration details:    Location:  Scalp   Scalp location:  Occipital   Length (cm):  1   Depth (mm):  2 Pre-procedure details:    Preparation:  Imaging obtained to evaluate for foreign bodies Exploration:    Limited defect created (wound extended): no     Imaging outcome: foreign body not noted     Wound exploration: wound explored through full range of motion and entire depth of wound visualized     Contaminated: no   Treatment:    Area cleansed with:  Shur-Clens   Amount of cleaning:  Standard   Debridement:  None   Undermining:  None   Scar revision: no   Skin repair:    Repair method:  Staples   Number of staples:  2 Approximation:    Approximation:  Close Repair type:    Repair type:  Simple Post-procedure details:    Dressing:  Open (no dressing)   Procedure completion:  Tolerated     Medications Ordered in ED Medications  acetaminophen (TYLENOL) tablet 650 mg (has no administration in time range)    Or  acetaminophen (TYLENOL) suppository 650 mg (has no administration in time range)  lactated ringers infusion ( Intravenous New Bag/Given 04/09/22 2328)  HYDROcodone-acetaminophen (NORCO/VICODIN) 5-325 MG per tablet 1 tablet (has no administration in time  range)  naloxone (NARCAN) injection 0.4 mg (has no administration in time range)  sodium chloride 0.9 % bolus 500 mL (0 mLs Intravenous Stopped  04/09/22 2259)  Tdap (BOOSTRIX) injection 0.5 mL (0.5 mLs Intramuscular Given 04/09/22 2326)    ED Course/ Medical Decision Making/ A&P                           Medical Decision Making Amount and/or Complexity of Data Reviewed Labs: ordered. Radiology: ordered.  Risk Prescription drug management. Decision regarding hospitalization.    CHELLI YERKES is a 71 y.o. female with a past medical history significant for hypertension, CHF, hyperlipidemia, previous GI bleeding, breast cancer, abdominal aortic aneurysm, and CAD with previous PCI on Plavix who presents as a level 2 trauma for fall and head injury.  According to patient, for the last week or so patient has been more fatigued and lightheaded and has had recurrent near syncopal episodes.  She is unsure if she is actually passed out.  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  She fell down several stairs and hurt her left shoulder and her back of her head.  She reports some mild headache but is mainly complaining of pain in her left shoulder.  She has had a previous right proximal humerus fracture is concerned it feels similar.  She denies numbness, tingling, or weakness in the hands.  Denies any vision changes, speech difficulties, nausea, or vomiting.  Denies any constipation or diarrhea but does report her urine has decreased this week despite her drinking lots of fluids.  She is denying any pain in her chest and denies any shortness of breath.  On exam, airway is intact.  Breath sounds are symmetric and equal bilaterally.  He is having no chest tenderness.  She does have a large lump to her left lateral chest that she reports is her breast cancer.  Back was nontender and neck was nontender.  She has a skin tear to her left  elbow that does not appear to need stitches.  Will update tetanus and have it washed and dressed.  She has intact pulses in both extremities and had intact sensation and strength.  She has tenderness in the left shoulder with some swelling.  We will get chest x-ray and left shoulder x-ray and then CT of the head and neck.  Patient's x-ray of the shoulder did show evidence of acute proximal humerus fracture.    10:24 PM Spoke to orthopedics with Dr. Laurance Flatten with [facility]o care as she has seen Dr. Lorin Mercy in the past.  He recommended shoulder immobilizer which was ordered and agrees with admission to medicine for the recurrent lightheadedness and near syncope in the setting of worsening hyponatremia.  We will order some fluids and call medicine for admission.  CT head and neck did not show acute traumatic injuries.  Labs began to return showing concerning hyponatremia going down to 116 on the i-STAT and 119 on the CMP.  Potassium low.  CBC shows no leukocytosis and mild anemia.  Lactic acid not elevated.  INR normal.  Unclear etiology of the hyponatremia if this is related to her cancer or dehydration.  She does not have evidence of AKI.  Her CK is elevated as she reports he was on the ground for a prolonged period of time before help arrived.  We will call medicine for recurrent near syncope and lightheadedness in the setting of hyponatremia leading to the fall and proximal humerus fracture on the left.  Final Clinical Impression(s) / ED Diagnoses Final diagnoses:  Hyponatremia  Near syncope  Fall, initial encounter  Closed fracture of proximal end of left humerus, unspecified fracture morphology, initial encounter    Clinical Impression: 1. Hyponatremia   2. Near syncope   3. Fall, initial encounter   4. Closed fracture of proximal end of left humerus, unspecified fracture morphology, initial encounter     Disposition: Admit  This note was prepared with assistance of Dragon voice  recognition software. Occasional wrong-word or sound-a-like substitutions may have occurred due to the inherent limitations of voice recognition software.      Shana Younge, Gwenyth Allegra, MD 04/10/22 0000

## 2022-04-09 NOTE — Plan of Care (Signed)
Orthopedic Plan of Care Note  Patient has a displaced surgical neck fracture with angulation. Discussed with ER. Will plan to treat non-operatively. Should be non-weight bearing on the left upper extremity in a sling. Okay for diet and dvt ppx from ortho perspective. If admitted, will plan to see her in the morning. If she discharges, she should follow up in one week in the office. Please page/call me with any questions. Thanks.   Callie Fielding, MD Orthopedic Surgeon

## 2022-04-09 NOTE — ED Notes (Signed)
Notified Dr. Gustavus Messing EDP of critical sodium 116 on ISTAT via face-to-face, verbally acknowledged.

## 2022-04-09 NOTE — Progress Notes (Signed)
   04/09/22 2100  Clinical Encounter Type  Visited With Patient and family together  Visit Type Initial;Trauma  Referral From Nurse  Consult/Referral To Chaplain   Chaplain responded to a level two trauma. Patient was under the care of the medical team. No family is present. If a chaplain is requested someone will respond.   Danice Goltz [facility]  [phone]

## 2022-04-10 ENCOUNTER — Ambulatory Visit (HOSPITAL_COMMUNITY): Payer: PPO

## 2022-04-10 ENCOUNTER — Encounter (HOSPITAL_COMMUNITY): Payer: Self-pay | Admitting: Internal Medicine

## 2022-04-10 ENCOUNTER — Ambulatory Visit (HOSPITAL_COMMUNITY)
Admission: RE | Admit: 2022-04-10 | Discharge: 2022-04-10 | Disposition: A | Discharge status: Home / Self Care | Payer: PPO | Source: Ambulatory Visit | Attending: Cardiovascular Disease | Admitting: Cardiovascular Disease

## 2022-04-10 DIAGNOSIS — E871 Hypo-osmolality and hyponatremia: Secondary | ICD-10-CM | POA: Diagnosis not present

## 2022-04-10 DIAGNOSIS — S42202A Unspecified fracture of upper end of left humerus, initial encounter for closed fracture: Secondary | ICD-10-CM | POA: Diagnosis present

## 2022-04-10 DIAGNOSIS — W19XXXA Unspecified fall, initial encounter: Secondary | ICD-10-CM | POA: Insufficient documentation

## 2022-04-10 DIAGNOSIS — R748 Abnormal levels of other serum enzymes: Secondary | ICD-10-CM | POA: Diagnosis present

## 2022-04-10 LAB — BASIC METABOLIC PANEL
Anion gap: 7 (ref 5–15)
Anion gap: 7 (ref 5–15)
BUN: 11 mg/dL (ref 8–23)
BUN: 10 mg/dL (ref 8–23)
CO2: 24 mmol/L (ref 22–32)
CO2: 23 mmol/L (ref 22–32)
Calcium: 8.4 mg/dL — ABNORMAL LOW (ref 8.9–10.3)
Calcium: 8.4 mg/dL — ABNORMAL LOW (ref 8.9–10.3)
Chloride: 90 mmol/L — ABNORMAL LOW (ref 98–111)
Chloride: 93 mmol/L — ABNORMAL LOW (ref 98–111)
Creatinine, Ser: 0.71 mg/dL (ref 0.44–1.00)
Creatinine, Ser: 0.69 mg/dL (ref 0.44–1.00)
GFR, Estimated: >60 mL/min (ref >60)
GFR, Estimated: >60 mL/min (ref >60)
Glucose, Bld: 93 mg/dL (ref 70–99)
Glucose, Bld: 114 mg/dL — ABNORMAL HIGH (ref 70–99)
Potassium: 4.2 mmol/L (ref 3.5–5.1)
Potassium: 4.5 mmol/L (ref 3.5–5.1)
Sodium: 121 mmol/L — ABNORMAL LOW (ref 135–145)
Sodium: 123 mmol/L — ABNORMAL LOW (ref 135–145)

## 2022-04-10 LAB — URINALYSIS, ROUTINE W REFLEX MICROSCOPIC
Bilirubin Urine: NEGATIVE
Glucose, UA: NEGATIVE mg/dL
Ketones, ur: NEGATIVE mg/dL
Nitrite: NEGATIVE
Protein, ur: NEGATIVE mg/dL
Specific Gravity, Urine: 1.008 (ref 1.005–1.030)
pH: 6 (ref 5.0–8.0)

## 2022-04-10 LAB — COMPREHENSIVE METABOLIC PANEL
ALT: 13 U/L (ref 0–44)
AST: 24 U/L (ref 15–41)
Albumin: 3.1 g/dL — ABNORMAL LOW (ref 3.5–5.0)
Alkaline Phosphatase: 40 U/L (ref 38–126)
Anion gap: 13 (ref 5–15)
BUN: 16 mg/dL (ref 8–23)
CO2: 22 mmol/L (ref 22–32)
Calcium: 8.3 mg/dL — ABNORMAL LOW (ref 8.9–10.3)
Chloride: 85 mmol/L — ABNORMAL LOW (ref 98–111)
Creatinine, Ser: 0.75 mg/dL (ref 0.44–1.00)
GFR, Estimated: >60 mL/min (ref >60)
Glucose, Bld: 121 mg/dL — ABNORMAL HIGH (ref 70–99)
Potassium: 3.2 mmol/L — ABNORMAL LOW (ref 3.5–5.1)
Sodium: 120 mmol/L — ABNORMAL LOW (ref 135–145)
Total Bilirubin: 0.3 mg/dL (ref 0.3–1.2)
Total Protein: 5.2 g/dL — ABNORMAL LOW (ref 6.5–8.1)

## 2022-04-10 LAB — OSMOLALITY: Osmolality: 248 mOsm/kg — CL (ref 275–295)

## 2022-04-10 LAB — TSH: TSH: 1.031 u[IU]/mL (ref 0.350–4.500)

## 2022-04-10 LAB — CBC WITH DIFFERENTIAL/PLATELET
Abs Immature Granulocytes: 0.02 10*3/uL (ref 0.00–0.07)
Basophils Absolute: 0 10*3/uL (ref 0.0–0.1)
Basophils Relative: 0 %
Eosinophils Absolute: 0 10*3/uL (ref 0.0–0.5)
Eosinophils Relative: 0 %
HCT: 25.5 % — ABNORMAL LOW (ref 36.0–46.0)
Hemoglobin: 9.1 g/dL — ABNORMAL LOW (ref 12.0–15.0)
Immature Granulocytes: 0 %
Lymphocytes Relative: 10 %
Lymphs Abs: 0.6 10*3/uL — ABNORMAL LOW (ref 0.7–4.0)
MCH: 33.7 pg (ref 26.0–34.0)
MCHC: 35.7 g/dL (ref 30.0–36.0)
MCV: 94.4 fL (ref 80.0–100.0)
Monocytes Absolute: 0.8 10*3/uL (ref 0.1–1.0)
Monocytes Relative: 13 %
Neutro Abs: 4.6 10*3/uL (ref 1.7–7.7)
Neutrophils Relative %: 77 %
Platelets: 137 10*3/uL — ABNORMAL LOW (ref 150–400)
RBC: 2.7 MIL/uL — ABNORMAL LOW (ref 3.87–5.11)
RDW: 13.1 % (ref 11.5–15.5)
WBC: 6 10*3/uL (ref 4.0–10.5)
nRBC: 0 % (ref 0.0–0.2)

## 2022-04-10 LAB — URIC ACID: Uric Acid, Serum: 3.5 mg/dL (ref 2.5–7.1)

## 2022-04-10 LAB — CK: Total CK: 434 U/L — ABNORMAL HIGH (ref 38–234)

## 2022-04-10 LAB — BRAIN NATRIURETIC PEPTIDE: B Natriuretic Peptide: 5.7 pg/mL (ref 0.0–100.0)

## 2022-04-10 LAB — MAGNESIUM: Magnesium: 1.3 mg/dL — ABNORMAL LOW (ref 1.7–2.4)

## 2022-04-10 MED ORDER — SENNA 8.6 MG PO TABS: 1.0000 | ORAL_TABLET | Freq: Two times a day (BID) | ORAL | 0 refills | Status: AC

## 2022-04-10 MED ORDER — OXYCODONE HCL 5 MG PO TABS: 2.5000 mg | ORAL_TABLET | ORAL | 0 refills | Status: DC | PRN

## 2022-04-10 MED ORDER — POTASSIUM CHLORIDE CRYS ER 20 MEQ PO TBCR
40.0000 meq | EXTENDED_RELEASE_TABLET | ORAL | Status: AC
  Administered 2022-04-10 (×2): 40 meq via ORAL
  Filled 2022-04-10 (×2): qty 2

## 2022-04-10 MED ORDER — CLOPIDOGREL BISULFATE 75 MG PO TABS
75.0000 mg | ORAL_TABLET | Freq: Every day | ORAL | Status: DC
  Administered 2022-04-10 – 2022-04-11 (×2): 75 mg via ORAL
  Filled 2022-04-10 (×2): qty 1

## 2022-04-10 MED ORDER — LACTATED RINGERS IV BOLUS
1000.0000 mL | Freq: Once | INTRAVENOUS | Status: AC
  Administered 2022-04-10: 1000 mL via INTRAVENOUS

## 2022-04-10 MED ORDER — LETROZOLE 2.5 MG PO TABS
2.5000 mg | ORAL_TABLET | Freq: Every day | ORAL | Status: DC
  Administered 2022-04-10 – 2022-04-11 (×2): 2.5 mg via ORAL
  Filled 2022-04-10 (×4): qty 1

## 2022-04-10 MED ORDER — MAGNESIUM SULFATE 2 GM/50ML IV SOLN
2.0000 g | Freq: Once | INTRAVENOUS | Status: AC
  Administered 2022-04-10: 2 g via INTRAVENOUS
  Filled 2022-04-10: qty 50

## 2022-04-10 MED ORDER — NERATINIB MALEATE 40 MG PO TABS
240.0000 mg | ORAL_TABLET | Freq: Every day | ORAL | Status: DC
  Administered 2022-04-11: 240 mg via ORAL

## 2022-04-10 MED ORDER — POLYSACCHARIDE IRON COMPLEX 150 MG PO CAPS
150.0000 mg | ORAL_CAPSULE | Freq: Every day | ORAL | Status: DC
  Administered 2022-04-10 – 2022-04-11 (×2): 150 mg via ORAL
  Filled 2022-04-10 (×4): qty 1

## 2022-04-10 MED ORDER — ENOXAPARIN SODIUM 30 MG/0.3ML IJ SOSY
30.0000 mg | PREFILLED_SYRINGE | Freq: Every day | INTRAMUSCULAR | Status: DC
  Administered 2022-04-10 – 2022-04-11 (×2): 30 mg via SUBCUTANEOUS
  Filled 2022-04-10 (×2): qty 0.3

## 2022-04-10 MED ORDER — SIMVASTATIN 20 MG PO TABS: 40.0000 mg | ORAL_TABLET | Freq: Every day | ORAL | Status: DC

## 2022-04-10 NOTE — ED Notes (Signed)
Patient set up for dinner

## 2022-04-10 NOTE — Discharge Instructions (Addendum)
Orthopedic Surgery Discharge Instructions  Patient name: [name] Orthopedic diagnosis: left proximal humerus fracture Orthopedic surgeon: Ileene Rubens, MD  Activity: You should remain non-weight bearing on the left upper extremity and in the sling. At two weeks, when I see you in the office, we will start to get you out of the sling and do pendulum exercises to get range of motion back. I will guide you through the rehabilitation process when I see you in the office.   Medications: You have been prescribed oxycodone. This is a narcotic pain medication and should only be taken as prescribed. You should not drink alcohol or operate heavy machinery (including driving) while taking this medication. The oxycodone can cause constipation as a side effect. For that reason, you have been prescribed senna. This is a laxative. You do not need to take this medication if you develop diarrhea. Should you remain constipated even while taking the senna, please use over-the-counter miralax as instructed on the packaging to promote regular bowel movements.  You can use over-the-counter NSAIDs (ibuprofen, Aleve, Celebrex, naproxen, meloxicam, etc.) for additional pain relief. These medications are safe to take with the Tylenol. You should not take these medications if you have or have had kidney problems or gastrointestinal ulcers. Take these medications as instructed on the packaging.   Driving: You should not drive while taking narcotic pain medications. You should have both hands to drive so it will be a couple of weeks until there will be enough healing to let you drive. I will let you know when you can use your arm enough to drive in the office.   Diet: You are safe to resume your regular diet after surgery.   Please continue to take salt tablets for the next 5 days, as directed, and then follow-up with your PCPs office on Tuesday next week for repeat sodium level.  Please avoid drinking large quantities  of water, limit yourself to 32 ounces of pure water per day, in addition to that you can drink soup, juice and so on  Follow with Reynold Bowen, MD in 5-7 days  Please get a complete blood count and chemistry panel checked by your Primary MD at your next visit, and again as instructed by your Primary MD. Please get your medications reviewed and adjusted by your Primary MD.  Please request your Primary MD to go over all Hospital Tests and Procedure/Radiological results at the follow up, please get all Hospital records sent to your Prim MD by signing hospital release before you go home.  In some cases, there will be blood work, cultures and biopsy results pending at the time of your discharge. Please request that your primary care M.D. goes through all the records of your hospital data and follows up on these results.  If you had Pneumonia of Lung problems at the Hospital: Please get a 2 view Chest X ray done in 6-8 weeks after hospital discharge or sooner if instructed by your Primary MD.  If you have Congestive Heart Failure: Please call your Cardiologist or Primary MD anytime you have any of the following symptoms:  1) 3 pound weight gain in 24 hours or 5 pounds in 1 week  2) shortness of breath, with or without a dry hacking cough  3) swelling in the hands, feet or stomach  4) if you have to sleep on extra pillows at night in order to breathe  Follow cardiac low salt diet and 1.5 lit/day fluid restriction.  If you  have diabetes Accuchecks 4 times/day, Once in AM empty stomach and then before each meal. Log in all results and show them to your primary doctor at your next visit. If any glucose reading is under 80 or above 300 call your primary MD immediately.  If you have Seizure/Convulsions/Epilepsy: Please do not drive, operate heavy machinery, participate in activities at heights or participate in high speed sports until you have seen by Primary MD or a Neurologist and advised to do  so again. Per [facility] statutes, patients with seizures are not allowed to drive until they have been seizure-free for six months.  Use caution when using heavy equipment or power tools. Avoid working on ladders or at heights. Take showers instead of baths. Ensure the water temperature is not too high on the home water heater. Do not go swimming alone. Do not lock yourself in a room alone (i.e. bathroom). When caring for infants or small children, sit down when holding, feeding, or changing them to minimize risk of injury to the child in the event you have a seizure. Maintain good sleep hygiene. Avoid alcohol.   If you had Gastrointestinal Bleeding: Please ask your Primary MD to check a complete blood count within one week of discharge or at your next visit. Your endoscopic/colonoscopic biopsies that are pending at the time of discharge, will also need to followed by your Primary MD.  Get Medicines reviewed and adjusted. Please take all your medications with you for your next visit with your Primary MD  Please request your Primary MD to go over all hospital tests and procedure/radiological results at the follow up, please ask your Primary MD to get all Hospital records sent to his/her office.  If you experience worsening of your admission symptoms, develop shortness of breath, life threatening emergency, suicidal or homicidal thoughts you must seek medical attention immediately by calling 911 or calling your MD immediately  if symptoms less severe.  You must read complete instructions/literature along with all the possible adverse reactions/side effects for all the Medicines you take and that have been prescribed to you. Take any new Medicines after you have completely understood and accpet all the possible adverse reactions/side effects.   Do not drive or operate heavy machinery when taking Pain medications.   Do not take more than prescribed Pain, Sleep and Anxiety  Medications  Special Instructions: If you have smoked or chewed Tobacco  in the last 2 yrs please stop smoking, stop any regular Alcohol  and or any Recreational drug use.  Wear Seat belts while driving.  Please note You were cared for by a hospitalist during your hospital stay. If you have any questions about your discharge medications or the care you received while you were in the hospital after you are discharged, you can call the unit and asked to speak with the hospitalist on call if the hospitalist that took care of you is not available. Once you are discharged, your primary care physician will handle any further medical issues. Please note that NO REFILLS for any discharge medications will be authorized once you are discharged, as it is imperative that you return to your primary care physician (or establish a relationship with a primary care physician if you do not have one) for your aftercare needs so that they can reassess your need for medications and monitor your lab values.  You can reach the hospitalist office at phone [phone] or fax [phone]   If you do not have a primary  care physician, you can call [phone] for a physician referral.  Activity: As tolerated with Full fall precautions use walker/cane & assistance as needed    Diet: regular  Disposition Home    Reasons to Call the Office After Surgery: You should feel free to call the office with any concerns or questions you have in the post-operative period, but you should definitely notify the office if you develop: -persistent nausea or vomiting -new weakness in the right arm, new or worsening numbness or tingling the right arm -other concerns about your fracture or arm  Follow Up Appointments: You should have an office appointment scheduled for approximately two weeks (on 04/24/2022). If you do not already have it scheduled, please call the office to schedule.   Office Information:  -Phone number:  [phone] -Address: [address]       [place], [place] [address]

## 2022-04-10 NOTE — ED Notes (Signed)
RN assumed care, pt resting w/ no signs of distress noted.

## 2022-04-10 NOTE — Consult Note (Signed)
Orthopedic Surgery Progress Note   Assessment: Patient is a 71 y.o. female with left proximal humerus fracture   Plan: -No acute operative intervention, will plan to treat non-operatively. -Explained that she has osteoporosis since she has sustained to proximal humerus fractures from falls. She is going to talk to her primary care provider about starting treatment -Okay for diet and dvt ppx from ortho perspective -Weight bearing status: NWB LUE in sling -PT/OT evaluate and treat -Pain control -Orthopedic discharge instructions placed into the discharge tab, opioids prescribed -Follow up in office in 2 weeks  ___________________________________________________________________________  Subjective: Patient had a fall and sustained a left proximal humerus fracture. She has a history of right proximal humerus fracture that healed with non-operative management. Had pain in her left shoulder and head after the fall. No pain elsewhere. 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 No changes in her sensation since the fall.    Physical Exam:  General: no acute distress, appears stated age Neurologic: alert, answering questions appropriately, following commands Respiratory: unlabored breathing on room air, symmetric chest rise Psychiatric: appropriate affect, normal cadence to speech  MSK:   -Left upper extremity  No tenderness to palpation over extremity, except over the shoulder  Sling in place Fires deltoid, biceps, triceps, wrist extensors, wrist flexors, finger extensors, finger flexors  AIN/PIN/IO intact  Palpable radial pulse  Sensation intact to light touch in median/ulnar/radial/axillary nerve distributions (decreased in hands from neuropathy but no changes since the fall)  Hand warm and well perfused  -Right upper extremity  No tenderness to palpation over extremity, no gross deformity Fires deltoid, biceps, triceps, wrist extensors, wrist flexors, finger extensors, finger  flexors  AIN/PIN/IO intact  Palpable radial pulse  Sensation intact to light touch in median/ulnar/radial/axillary nerve distributions (decreased in hands from neuropathy but no changes since the fall)  Hand warm and well perfused  -Bilateral lower extremities  No tenderness to palpation over extremity, no pain with log roll, no gross deformity Fires hip flexors, quadriceps, hamstrings, tibialis anterior, gastrocnemius and soleus, extensor hallucis longus Plantarflexes and dorsiflexes toes Sensation intact to light touch in sural, saphenous, tibial, deep peroneal, and superficial peroneal nerve distributions (decreased in multiple distributions due to neuropathy but no changes since the fall) Foot warm and well perfused   Patient name: [name] Patient MRN: [id number] Date: 04/10/22

## 2022-04-10 NOTE — Progress Notes (Signed)
PROGRESS NOTE  [name] JOA:[id number] DOB: 09-18-1950 DOA: 04/09/2022 PCP: Reynold Bowen, MD   LOS: 1 day   Brief Narrative / Interim history: 71 year old female with history of breast cancer status post chemo and radiation therapy, currently on letrozole, chronic iron deficiency anemia, chronic diastolic CHF, HTN, HLD, chronic hyponatremia with a sodium of around 130 comes into the hospital with weakness, recent fall.  She was found to have a left proximal humerus fracture, was also found to be hyponatremic with a sodium as low as 116.  Subjective / 24h Interval events: She is doing overall well this morning.  Denies any chest pain, denies any shortness of breath.  Assesement and Plan: Principal Problem:   Acute hyponatremia Active Problems:   AKI (acute kidney injury) ([facility])   HLD (hyperlipidemia)   Hypokalemia   Chronic diastolic CHF (congestive heart failure) ([facility])   Chronic iron deficiency anemia   Fall   Closed fracture of left proximal humerus   Elevated CPK   Principal problem Hypochloremic hyponatremia-with concern for poor p.o. intake, however she tells me she drinks about 4 large water canisters of water per day (32 ounce size).  Place patient on fluid restriction, continue to monitor sodium levels.  There is a chronicity to her hyponatremia, probably also has poor solute intake  Active problems Fall with left proximal humerus fracture-orthopedic surgery consulted, recommending nonoperative treatment.  PT/OT  Hypokalemia-replete and continue to monitor  Chronic iron deficiency anemia -hemoglobin stable  Chronic diastolic CHF -appears euvolemic  Breast cancer -outpatient follow-up   Scheduled Meds:  clopidogrel  75 mg Oral Daily   iron polysaccharides  150 mg Oral Daily   letrozole  2.5 mg Oral Daily   Neratinib Maleate  240 mg Oral Daily   Continuous Infusions: PRN Meds:.acetaminophen **OR** acetaminophen, HYDROcodone-acetaminophen, naLOXone (NARCAN)   injection  Current Outpatient Medications  Medication Instructions   acetaminophen (TYLENOL) 650 mg, Oral, Every 6 hours PRN   clopidogrel (PLAVIX) 75 MG tablet TAKE 1 TABLET BY MOUTH EVERY DAY   cyanocobalamin (VITAMIN B12) 1,000 mcg, Intramuscular, Every 30 days   docusate sodium (COLACE) 100 mg, Oral, Daily PRN   DULoxetine (CYMBALTA) 30 mg, Oral, Daily   ezetimibe (ZETIA) 10 mg, Oral, Daily   gabapentin (NEURONTIN) 300 mg, Oral, 2 times daily   iron polysaccharides (NIFEREX) 150 mg, Oral, Daily   letrozole (FEMARA) 2.5 mg, Oral, Daily   loperamide (IMODIUM) 2 mg, Oral, Daily PRN   Neratinib Maleate (NERLYNX) 160 mg, Oral, Daily, 4 tablets = 160 mg   oxyCODONE (ROXICODONE) 2.5-5 mg, Oral, Every 4 hours PRN   senna (SENOKOT) 8.6 mg, Oral, 2 times daily   simvastatin (ZOCOR) 40 MG tablet TAKE 1 TABLET(40 MG) BY MOUTH DAILY AT 6 PM   Vitamin D (Cholecalciferol) 2,000 Units, Oral, Daily    Diet Orders (From admission, onward)     Start     Ordered   04/10/22 0725  Diet regular Room service appropriate? Yes; Fluid consistency: Thin; Fluid restriction: 1200 mL Fluid  Diet effective now       Question Answer Comment  Room service appropriate? Yes   Fluid consistency: Thin   Fluid restriction: 1200 mL Fluid      04/10/22 0724            DVT prophylaxis: SCDs Start: 04/09/22 2255   Lab Results  Component Value Date   PLT 137 (L) 04/10/2022      Code Status: Full Code  Family Communication: no  family at bedside   Status is: Inpatient  Remains inpatient appropriate because: Persistently hyponatremic   Level of care: Progressive  Consultants:  none  Objective: Vitals:   04/10/22 0630 04/10/22 0700 04/10/22 0900 04/10/22 0911  BP: 135/70 123/68 120/86   Pulse: 73 77 76   Resp: (!) '22 11 19   '$ Temp:    97.8 F (36.6 C)  TempSrc:    Axillary  SpO2: 97% 98% 94%   Weight:      Height:       No intake or output data in the 24 hours ending 04/10/22 1045 Wt  Readings from Last 3 Encounters:  04/09/22 42.6 kg  03/18/22 44 kg  11/28/21 41.7 kg    Examination:  Constitutional: NAD Eyes: no scleral icterus ENMT: Mucous membranes are moist.  Neck: normal, supple Respiratory: clear to auscultation bilaterally, no wheezing, no crackles. Normal respiratory effort. No accessory muscle use.  Cardiovascular: Regular rate and rhythm, no murmurs / rubs / gallops. No LE edema.  Abdomen: non distended, no tenderness. Bowel sounds positive.  Musculoskeletal: no clubbing / cyanosis.  Skin: no rashes Neurologic: non focal   Data Reviewed: I have independently reviewed following labs and imaging studies   CBC Recent Labs  Lab 04/09/22 2105 04/09/22 2114 04/10/22 0256  WBC 7.3  --  6.0  HGB 10.4* 10.9* 9.1*  HCT 28.4* 32.0* 25.5*  PLT 180  --  137*  MCV 92.2  --  94.4  MCH 33.8  --  33.7  MCHC 36.6*  --  35.7  RDW 12.9  --  13.1  LYMPHSABS  --   --  0.6*  MONOABS  --   --  0.8  EOSABS  --   --  0.0  BASOSABS  --   --  0.0    Recent Labs  Lab 04/09/22 2105 04/09/22 2114 04/10/22 0256 04/10/22 0934  NA 119* 116* 120* 121*  K 2.9* 2.9* 3.2* 4.2  CL 80* 79* 85* 90*  CO2 23  --  22 24  GLUCOSE 132* 133* 121* 93  BUN '17 17 16 11  '$ CREATININE 0.99 0.90 0.75 0.71  CALCIUM 8.9  --  8.3* 8.4*  AST 34  --  24  --   ALT 15  --  13  --   ALKPHOS 45  --  40  --   BILITOT 0.6  --  0.3  --   ALBUMIN 4.0  --  3.1*  --   MG  --   --  1.3*  --   LATICACIDVEN 1.3  --   --   --   INR 1.0  --   --   --   TSH  --   --  1.031  --   BNP  --   --  5.7  --     ------------------------------------------------------------------------------------------------------------------ No results for input(s): "CHOL", "HDL", "LDLCALC", "TRIG", "CHOLHDL", "LDLDIRECT" in the last 72 hours.  Lab Results  Component Value Date   HGBA1C 5.6 08/10/2018    ------------------------------------------------------------------------------------------------------------------ Recent Labs    04/10/22 0256  TSH 1.031    Cardiac Enzymes No results for input(s): "CKMB", "TROPONINI", "MYOGLOBIN" in the last 168 hours.  Invalid input(s): "CK" ------------------------------------------------------------------------------------------------------------------    Component Value Date/Time   BNP 5.7 04/10/2022 0256    CBG: No results for input(s): "GLUCAP" in the last 168 hours.  No results found for this or any previous visit (from the past 240 hour(s)).   Radiology Studies: CT  HEAD WO CONTRAST  Result Date: 04/09/2022 CLINICAL DATA:  Trauma. EXAM: CT HEAD WITHOUT CONTRAST CT CERVICAL SPINE WITHOUT CONTRAST TECHNIQUE: Multidetector CT imaging of the head and cervical spine was performed following the standard protocol without intravenous contrast. Multiplanar CT image reconstructions of the cervical spine were also generated. RADIATION DOSE REDUCTION: This exam was performed according to the departmental dose-optimization program which includes automated exposure control, adjustment of the mA and/or kV according to patient size and/or use of iterative reconstruction technique. COMPARISON:  CT of the head and C-spine dated 08/27/2021. FINDINGS: CT HEAD FINDINGS Brain: The ventricles and sulci are appropriate size for the patient's age. The gray-white matter discrimination is preserved. There is no acute intracranial hemorrhage. No mass effect or midline shift. No extra-axial fluid collection. Vascular: No hyperdense vessel or unexpected calcification. Skull: Normal. Negative for fracture or focal lesion. Sinuses/Orbits: No acute finding. Other: Mild left forehead contusion and midline occipital scalp contusion. CT CERVICAL SPINE FINDINGS Alignment: No acute subluxation.  Grade 1 C4-C5 anterolisthesis. Skull base and vertebrae: No acute fracture.  Osteopenia.  Soft tissues and spinal canal: No prevertebral fluid or swelling. No visible canal hematoma. Disc levels:  No acute findings. Upper chest: Emphysema. Other: Partially visualized right-sided Port-A-Cath. Bilateral carotid bulb calcified plaques. IMPRESSION: 1. No acute intracranial pathology. 2. No acute/traumatic cervical spine pathology. Electronically Signed   By: Anner Crete M.D.   On: 04/09/2022 21:43   CT CERVICAL SPINE WO CONTRAST  Result Date: 04/09/2022 CLINICAL DATA:  Trauma. EXAM: CT HEAD WITHOUT CONTRAST CT CERVICAL SPINE WITHOUT CONTRAST TECHNIQUE: Multidetector CT imaging of the head and cervical spine was performed following the standard protocol without intravenous contrast. Multiplanar CT image reconstructions of the cervical spine were also generated. RADIATION DOSE REDUCTION: This exam was performed according to the departmental dose-optimization program which includes automated exposure control, adjustment of the mA and/or kV according to patient size and/or use of iterative reconstruction technique. COMPARISON:  CT of the head and C-spine dated 08/27/2021. FINDINGS: CT HEAD FINDINGS Brain: The ventricles and sulci are appropriate size for the patient's age. The gray-white matter discrimination is preserved. There is no acute intracranial hemorrhage. No mass effect or midline shift. No extra-axial fluid collection. Vascular: No hyperdense vessel or unexpected calcification. Skull: Normal. Negative for fracture or focal lesion. Sinuses/Orbits: No acute finding. Other: Mild left forehead contusion and midline occipital scalp contusion. CT CERVICAL SPINE FINDINGS Alignment: No acute subluxation.  Grade 1 C4-C5 anterolisthesis. Skull base and vertebrae: No acute fracture.  Osteopenia. Soft tissues and spinal canal: No prevertebral fluid or swelling. No visible canal hematoma. Disc levels:  No acute findings. Upper chest: Emphysema. Other: Partially visualized right-sided Port-A-Cath.  Bilateral carotid bulb calcified plaques. IMPRESSION: 1. No acute intracranial pathology. 2. No acute/traumatic cervical spine pathology. Electronically Signed   By: Anner Crete M.D.   On: 04/09/2022 21:43   DG Shoulder Left Port  Result Date: 04/09/2022 CLINICAL DATA:  Dizziness, fall down stairs, left shoulder pain. EXAM: LEFT SHOULDER COMPARISON:  None Available. FINDINGS: Acute surgical neck fracture of the left proximal humerus with varus angulation mild comminution along the medial metaphysis. This is most likely a Neer 2 part fracture given the degree of angulation although CT could be utilized for definitive characterization. IMPRESSION: 1. Acute surgical neck fracture of the left proximal humerus with varus angulation and mild comminution. Electronically Signed   By: Van Clines M.D.   On: 04/09/2022 21:37   DG Chest [facility] 1 View  Result  Date: 04/09/2022 CLINICAL DATA:  Fall down 2-3 stairs proceeded by dizziness. Blunt trauma, left shoulder/chest pain. EXAM: PORTABLE CHEST 1 VIEW COMPARISON:  Multiple exams, including 10/02/2021 and 01/14/2022 FINDINGS: Power injectable right Port-A-Cath tip: SVC. Atherosclerotic calcification of the aortic arch. Heart size within normal limits. Tapering of the peripheral pulmonary vasculature favors emphysema. No blunting of the costophrenic angles. Clips along the left breast. Chronic deformity of the right proximal humerus compatible with a prior fracture. Acute deformity of the left proximal humerus compatible with acute left surgical neck fracture. IMPRESSION: 1. Acute surgical neck fracture the left proximal humerus. 2. Chronic deformity of the right proximal humerus compatible with a prior fracture. 3. Emphysema. 4. Atherosclerotic calcification of the aortic arch. Aortic Atherosclerosis (ICD10-I70.0) and Emphysema (ICD10-J43.9). Electronically Signed   By: Van Clines M.D.   On: 04/09/2022 21:35     Marzetta Board, MD, PhD [facility]  Between 7 am - 7 pm I am available, please contact me via Amion (for emergencies) or Securechat (non urgent messages)  Between 7 pm - 7 am I am not available, please contact night coverage MD/APP via Amion

## 2022-04-10 NOTE — ED Notes (Signed)
The pt is sleeping

## 2022-04-11 DIAGNOSIS — E43 Unspecified severe protein-calorie malnutrition: Secondary | ICD-10-CM | POA: Insufficient documentation

## 2022-04-11 DIAGNOSIS — E871 Hypo-osmolality and hyponatremia: Secondary | ICD-10-CM | POA: Diagnosis not present

## 2022-04-11 LAB — BASIC METABOLIC PANEL
Anion gap: 9 (ref 5–15)
Anion gap: 10 (ref 5–15)
BUN: 9 mg/dL (ref 8–23)
BUN: 11 mg/dL (ref 8–23)
CO2: 20 mmol/L — ABNORMAL LOW (ref 22–32)
CO2: 25 mmol/L (ref 22–32)
Calcium: 8.4 mg/dL — ABNORMAL LOW (ref 8.9–10.3)
Calcium: 8.3 mg/dL — ABNORMAL LOW (ref 8.9–10.3)
Chloride: 95 mmol/L — ABNORMAL LOW (ref 98–111)
Chloride: 93 mmol/L — ABNORMAL LOW (ref 98–111)
Creatinine, Ser: 0.7 mg/dL (ref 0.44–1.00)
Creatinine, Ser: 0.72 mg/dL (ref 0.44–1.00)
GFR, Estimated: >60 mL/min (ref >60)
GFR, Estimated: >60 mL/min (ref >60)
Glucose, Bld: 98 mg/dL (ref 70–99)
Glucose, Bld: 89 mg/dL (ref 70–99)
Potassium: 4.6 mmol/L (ref 3.5–5.1)
Potassium: 4.3 mmol/L (ref 3.5–5.1)
Sodium: 124 mmol/L — ABNORMAL LOW (ref 135–145)
Sodium: 128 mmol/L — ABNORMAL LOW (ref 135–145)

## 2022-04-11 LAB — CBC
HCT: 25 % — ABNORMAL LOW (ref 36.0–46.0)
Hemoglobin: 9.2 g/dL — ABNORMAL LOW (ref 12.0–15.0)
MCH: 35.1 pg — ABNORMAL HIGH (ref 26.0–34.0)
MCHC: 36.8 g/dL — ABNORMAL HIGH (ref 30.0–36.0)
MCV: 95.4 fL (ref 80.0–100.0)
Platelets: 130 10*3/uL — ABNORMAL LOW (ref 150–400)
RBC: 2.62 MIL/uL — ABNORMAL LOW (ref 3.87–5.11)
RDW: 13.2 % (ref 11.5–15.5)
WBC: 3.8 10*3/uL — ABNORMAL LOW (ref 4.0–10.5)
nRBC: 0 % (ref 0.0–0.2)

## 2022-04-11 LAB — COMPREHENSIVE METABOLIC PANEL
ALT: 14 U/L (ref 0–44)
AST: 24 U/L (ref 15–41)
Albumin: 3.2 g/dL — ABNORMAL LOW (ref 3.5–5.0)
Alkaline Phosphatase: 40 U/L (ref 38–126)
Anion gap: 7 (ref 5–15)
BUN: 11 mg/dL (ref 8–23)
CO2: 24 mmol/L (ref 22–32)
Calcium: 8.4 mg/dL — ABNORMAL LOW (ref 8.9–10.3)
Chloride: 92 mmol/L — ABNORMAL LOW (ref 98–111)
Creatinine, Ser: 0.81 mg/dL (ref 0.44–1.00)
GFR, Estimated: >60 mL/min (ref >60)
Glucose, Bld: 94 mg/dL (ref 70–99)
Potassium: 4.3 mmol/L (ref 3.5–5.1)
Sodium: 123 mmol/L — ABNORMAL LOW (ref 135–145)
Total Bilirubin: 0.6 mg/dL (ref 0.3–1.2)
Total Protein: 5.4 g/dL — ABNORMAL LOW (ref 6.5–8.1)

## 2022-04-11 LAB — MAGNESIUM: Magnesium: 1.8 mg/dL (ref 1.7–2.4)

## 2022-04-11 LAB — URINE CULTURE

## 2022-04-11 MED ORDER — SODIUM CHLORIDE 1 G PO TABS
2.0000 g | ORAL_TABLET | Freq: Three times a day (TID) | ORAL | Status: DC
  Administered 2022-04-11 – 2022-04-12 (×4): 2 g via ORAL
  Filled 2022-04-11 (×4): qty 2

## 2022-04-11 MED ORDER — SODIUM CHLORIDE 0.9 % IV BOLUS
250.0000 mL | Freq: Once | INTRAVENOUS | Status: AC
  Administered 2022-04-11: 250 mL via INTRAVENOUS

## 2022-04-11 MED ORDER — SODIUM CHLORIDE 0.9 % IV SOLN: INTRAVENOUS | Status: DC

## 2022-04-11 MED ORDER — ENSURE ENLIVE PO LIQD
237.0000 mL | Freq: Two times a day (BID) | ORAL | Status: DC
  Administered 2022-04-11: 237 mL via ORAL

## 2022-04-11 NOTE — Evaluation (Signed)
Occupational Therapy Evaluation Patient Details Name: [name] MRN: [id number] DOB: 1951-03-14 Today's Date: 04/11/2022   History of Present Illness 71 y.o. female presents to [facility] hospital on 04/09/2022 with weakness and recent fall. Pt found to have L proximal humerus fx and was hyponatremic. PMH includes breast cancer, anemia, ischemic cardiomyopathy with persistent diastolic HF, HTN, hyponatremia, and frequent falls.   Clinical Impression   Pt in bed upon therapy arrival and agreeable to participate in OT evaluation. Pt presents with decreased ROM, strength, activity tolerance, and balance requiring increased physical assistance to complete BADL tasks and functional mobility. OT educated patient on UE HEP, proper sling wearing, and UE precautions. Pt returned demonstration of exercises. Recommended discharge to SNF at this time due to lack of support at home and pt's high risk of falls. Pt wishes to return home with home health therapy. OT will follow patient acutely.      Recommendations for follow up therapy are one component of a multi-disciplinary discharge planning process, led by the attending physician.  Recommendations may be updated based on patient status, additional functional criteria and insurance authorization.   Follow Up Recommendations  Skilled nursing-short term rehab (<3 hours/day)    Assistance Recommended at Discharge Set up Supervision/Assistance  Patient can return home with the following A little help with walking and/or transfers;A little help with bathing/dressing/bathroom;Help with stairs or ramp for entrance;Assistance with cooking/housework;Assist for transportation    Functional Status Assessment  Patient has had a recent decline in their functional status and demonstrates the ability to make significant improvements in function in a reasonable and predictable amount of time.  Equipment Recommendations  None recommended by OT       Precautions /  Restrictions Precautions Precautions: Fall;Shoulder Shoulder Interventions: Shoulder sling/immobilizer;Off for dressing/bathing/exercises Precaution Booklet Issued: Yes (comment) Precaution Comments: left proximal humerus fracture. Verbal education/precautions provided. Required Braces or Orthoses: Sling Restrictions Weight Bearing Restrictions: Yes LUE Weight Bearing: Non weight bearing      Mobility Bed Mobility Overal bed mobility: Needs Assistance Bed Mobility: Supine to Sit     Supine to sit: Min assist     General bed mobility comments: increased time. Assist to full trunk up off bed. Patient Response: Cooperative, Flat affect  Transfers Overall transfer level: Needs assistance Equipment used: 1 person hand held assist Transfers: Sit to/from Stand Sit to Stand: Min assist        Balance Overall balance assessment: Needs assistance Sitting-balance support: Feet supported, No upper extremity supported Sitting balance-Leahy Scale: Fair     Standing balance support: Single extremity supported Standing balance-Leahy Scale: Poor     ADL either performed or assessed with clinical judgement   ADL Overall ADL's : Needs assistance/impaired Eating/Feeding: Set up;Sitting   Grooming: Wash/dry face;Wash/dry hands;Oral care;Applying deodorant;Brushing hair;Minimal assistance;Sitting   Upper Body Bathing: Minimal assistance;Adhering to UE precautions;Cueing for sequencing;Sitting;Cueing for UE precautions;Cueing for compensatory techniques   Lower Body Bathing: Sit to/from stand;Sitting/lateral leans;Cueing for sequencing;Cueing for safety;Minimal assistance   Upper Body Dressing : Minimal assistance;Cueing for safety;Cueing for sequencing;Sitting;Cueing for UE precautions;Adhering to UE precautions   Lower Body Dressing: Minimal assistance;Sitting/lateral leans;Sit to/from stand   Toilet Transfer: Minimal assistance;BSC/3in1;Stand-pivot   Toileting- Clothing  Manipulation and Hygiene: Minimal assistance;Sitting/lateral lean               Vision Baseline Vision/History: 0 No visual deficits Ability to See in Adequate Light: 0 Adequate Patient Visual Report: No change from baseline       Perception  Perception Perception: Within Functional Limits   Praxis Praxis Praxis: Intact    Pertinent Vitals/Pain Pain Assessment Pain Assessment: Faces Faces Pain Scale: No hurt     Hand Dominance Right   Extremity/Trunk Assessment Upper Extremity Assessment Upper Extremity Assessment: LUE deficits/detail;Generalized weakness LUE Deficits / Details: Unable to assess shoulder due to precautions. Unable to demonstrate active elbow flexion/extension although functional passive ROM present. Full wrist A/ROM in all ranges. Full digit flexion/extension. Slight edema noted in fingers. Weak gross grasp LUE: Unable to fully assess due to immobilization LUE Coordination: decreased fine motor;decreased gross motor   Lower Extremity Assessment Lower Extremity Assessment: Defer to PT evaluation       Communication Communication Communication: No difficulties   Cognition Arousal/Alertness: Awake/alert Behavior During Therapy: WFL for tasks assessed/performed Overall Cognitive Status: Impaired/Different from baseline Area of Impairment: Safety/judgement, Awareness     Safety/Judgement: Decreased awareness of safety Awareness: Emergent         General Comments  BP at start of sesion: 107/67 3:35 PM (sitting EOB)            Home Living Family/patient expects to be discharged to:: Private residence Living Arrangements: Alone Available Help at Discharge: Neighbor;Friend(s);Available PRN/intermittently Type of Home: House Home Access: Stairs to enter [employer] of Steps: 3 Entrance Stairs-Rails: Left Home Layout: One level     Bathroom Shower/Tub: Teacher, early years/pre: Standard Bathroom Accessibility: Yes    Home Equipment: Conservation officer, nature (2 wheels);Cane - single point;BSC/3in1;Shower seat   Additional Comments: Has a dog.      Prior Functioning/Environment Prior Level of Function : Independent/Modified Independent;History of Falls (last six months);Driving       Mobility Comments: ambulates with use of cane outdoors          OT Problem List: Decreased strength;Decreased activity tolerance;Decreased safety awareness;Decreased knowledge of use of DME or AE;Decreased knowledge of precautions;Impaired UE functional use;Impaired balance (sitting and/or standing)      OT Treatment/Interventions: Self-care/ADL training;Therapeutic exercise;Therapeutic activities;Energy conservation;DME and/or AE instruction;Patient/family education;Balance training;Manual therapy;Modalities;Neuromuscular education    OT Goals(Current goals can be found in the care plan section) Acute Rehab OT Goals Patient Stated Goal: to go home OT Goal Formulation: With patient Time For Goal Achievement: 04/25/22 Potential to Achieve Goals: Good  OT Frequency: Min 2X/week       AM-PAC OT "6 Clicks" Daily Activity     Outcome Measure Help from another person eating meals?: A Little Help from another person taking care of personal grooming?: A Little Help from another person toileting, which includes using toliet, bedpan, or urinal?: A Little Help from another person bathing (including washing, rinsing, drying)?: A Little Help from another person to put on and taking off regular upper body clothing?: A Little Help from another person to put on and taking off regular lower body clothing?: A Little 6 Click Score: 18   End of Session    Activity Tolerance: Patient tolerated treatment well Patient left: in bed;with call bell/phone within reach;with bed alarm set  OT Visit Diagnosis: Muscle weakness (generalized) (M62.81);History of falling (Z91.81)                Time: 1530-1550 OT Time Calculation (min): 20  min Charges:  OT General Charges $OT Visit: 1 Visit OT Evaluation $OT Eval Moderate Complexity: 1 Mod  [employer], OTR/L,CBIS  Supplemental OT - [facility] and [facility]   Airon Sahni, Clarene Duke 04/11/2022, 4:14 PM

## 2022-04-11 NOTE — ED Notes (Signed)
No purple man I called up after 57 minutes they did not know they were getting a pt  nurse was in a room  she is to call back

## 2022-04-11 NOTE — TOC Progression Note (Signed)
Transition of Care [facility]) - Progression Note    Patient Details  Name: [name] MRN: [id number] Date of Birth: May 20, 1951  Transition of Care [facility]) CM/SW [place], RN Phone Number: 04/11/2022, 4:36 PM  Clinical Narrative:    Patient declined SNF, but willing to do home health. Medicare.gov list given to patient and placed in shadow chart.earlier, and patient stated she would look it over. She had [facility]) in the past.  1600 revisited patient, [facility] , advanced) for [facility] HH order placed, likely will DC in AM [place] from [facility] messaged about acceptance.    Expected Discharge Plan: [place] Barriers to Discharge: No Barriers Identified (refused SNF)  Expected Discharge Plan and Services Expected Discharge Plan: [place]   Discharge Planning Services: CM Consult Post Acute Care Choice: [place] arrangements for the past 2 months: Single Family Home                                       Social Determinants of Health (SDOH) Interventions    Readmission Risk Interventions     No data to display

## 2022-04-11 NOTE — Evaluation (Signed)
Physical Therapy Evaluation Patient Details Name: [name] MRN: [id number] DOB: 1951-03-03 Today's Date: 04/11/2022  History of Present Illness  71 y.o. female presents to [facility] hospital on 04/09/2022 with weakness and recent fall. Pt found to have L proximal humerus fx and was hyponatremic. PMH includes breast cancer, anemia, ischemic cardiomyopathy with persistent diastolic HF, HTN, hyponatremia, and frequent falls.  Clinical Impression  Pt presents to PT with deficits in strength, power, gait, balance, endurance, safety awareness. Pt with multiple losses of balance during session, one when standing without UE support and multiple when ambulating with SPC. Pt is able to acknowledge instability at this time but is not agreeable to pursuing SNF placement, despite having inconsistent caregiver support available at home. Pt will benefit from aggressive mobilization and balance training in an effort to reduce falls risk. PT will assess use of quad cane vs hemiwalker next session to determine the assistive device that improves pt balance the most. PT recommends SNF placement however pt is refusing at this time, will benefit from HHPT if she continues to decline.       Recommendations for follow up therapy are one component of a multi-disciplinary discharge planning process, led by the attending physician.  Recommendations may be updated based on patient status, additional functional criteria and insurance authorization.  Follow Up Recommendations Skilled nursing-short term rehab (<3 hours/day) (pt not agreeable to SNF, will benefit from HHPT if continuing to refuse placement) Can patient physically be transported by private vehicle: Yes    Assistance Recommended at Discharge Frequent or constant Supervision/Assistance  Patient can return home with the following  A lot of help with walking and/or transfers;A lot of help with bathing/dressing/bathroom;Assistance with cooking/housework;Assist for  transportation;Help with stairs or ramp for entrance    Equipment Recommendations  (quad cane vs hemiwalker TBD next session)  Recommendations for Other Services       Functional Status Assessment Patient has had a recent decline in their functional status and demonstrates the ability to make significant improvements in function in a reasonable and predictable amount of time.     Precautions / Restrictions Precautions Precautions: Fall Required Braces or Orthoses: Sling Restrictions Weight Bearing Restrictions: Yes LUE Weight Bearing: Non weight bearing      Mobility  Bed Mobility Overal bed mobility: Needs Assistance Bed Mobility: Rolling, Sidelying to Sit Rolling: Min guard Sidelying to sit: Min guard, HOB elevated       General bed mobility comments: increased time, multiple unsuccessful attempts initially    Transfers Overall transfer level: Needs assistance Equipment used: Straight cane Transfers: Sit to/from Stand Sit to Stand: Min guard                Ambulation/Gait Ambulation/Gait assistance: Herbalist (Feet): 20 Feet Assistive device: Straight cane Gait Pattern/deviations: Step-through pattern, Staggering right, Staggering left Gait velocity: reduced Gait velocity interpretation: <1.31 ft/sec, indicative of household ambulator   General Gait Details: pt with increased lateral sway and multiple instances of staggering side to side, PT providing assist to prevent fall to R side. Pt does lose balance without UE support when standing at edge of bed and sits back down onto mattress  Stairs            Wheelchair Mobility    Modified Rankin (Stroke Patients Only)       Balance Overall balance assessment: Needs assistance Sitting-balance support: Feet supported, No upper extremity supported Sitting balance-Leahy Scale: Good     Standing balance support: Single  extremity supported Standing balance-Leahy Scale: Poor Standing  balance comment: reliant on assistive device and minG-minA                             Pertinent Vitals/Pain Pain Assessment Pain Assessment: 0-10 Pain Score: 8  Pain Location: L shoulder Pain Descriptors / Indicators: Aching Pain Intervention(s): Monitored during session    Home Living Family/patient expects to be discharged to:: Private residence Living Arrangements: Alone Available Help at Discharge: Neighbor;Friend(s);Available PRN/intermittently Type of Home: House Home Access: Stairs to enter Entrance Stairs-Rails: Left Entrance Stairs-Number of Steps: 3   Home Layout: One level Home Equipment: Conservation officer, nature (2 wheels);Cane - single point;BSC/3in1;Shower seat      Prior Function Prior Level of Function : Independent/Modified Independent;History of Falls (last six months);Driving             Mobility Comments: ambulates with use of cane outdoors       Hand Dominance   Dominant Hand: Right    Extremity/Trunk Assessment   Upper Extremity Assessment Upper Extremity Assessment: LUE deficits/detail LUE Deficits / Details: LUE in sling, digit and wrist motion WFL otherwise not formally assessed    Lower Extremity Assessment Lower Extremity Assessment: Generalized weakness    Cervical / Trunk Assessment Cervical / Trunk Assessment: Normal  Communication   Communication: No difficulties  Cognition Arousal/Alertness: Awake/alert Behavior During Therapy: WFL for tasks assessed/performed Overall Cognitive Status: Impaired/Different from baseline Area of Impairment: Safety/judgement, Awareness                         Safety/Judgement: Decreased awareness of safety Awareness: Emergent            General Comments General comments (skin integrity, edema, etc.): VSS on RA    Exercises     Assessment/Plan    PT Assessment Patient needs continued PT services  PT Problem List Decreased strength;Decreased activity tolerance;Decreased  balance;Decreased mobility;Decreased safety awareness;Decreased knowledge of use of DME;Decreased knowledge of precautions;Pain       PT Treatment Interventions DME instruction;Gait training;Stair training;Functional mobility training;Therapeutic activities;Therapeutic exercise;Balance training;Neuromuscular re-education;Patient/family education    PT Goals (Current goals can be found in the Care Plan section)  Acute Rehab PT Goals Patient Stated Goal: to go home PT Goal Formulation: With patient Time For Goal Achievement: 04/25/22 Potential to Achieve Goals: Poor Additional Goals Additional Goal #1: Pt will score >45/56 on the BERG balance test to indicate a reduced risk for falls    Frequency Min 5X/week (refusing SNF)     Co-evaluation               AM-PAC PT "6 Clicks" Mobility  Outcome Measure Help needed turning from your back to your side while in a flat bed without using bedrails?: A Little Help needed moving from lying on your back to sitting on the side of a flat bed without using bedrails?: A Little Help needed moving to and from a bed to a chair (including a wheelchair)?: A Little Help needed standing up from a chair using your arms (e.g., wheelchair or bedside chair)?: A Little Help needed to walk in hospital room?: A Lot Help needed climbing 3-5 steps with a railing? : Total 6 Click Score: 15    End of Session   Activity Tolerance: Patient tolerated treatment well Patient left: in chair;with call bell/phone within reach;with chair alarm set Nurse Communication: Mobility status PT Visit Diagnosis: Muscle weakness (  generalized) (M62.81);Other abnormalities of gait and mobility (R26.89);History of falling (Z91.81)    Time: 5430-1484 PT Time Calculation (min) (ACUTE ONLY): 29 min   Charges:   PT Evaluation $PT Eval Low Complexity: [place], PT, DPT Acute Rehabilitation Office [phone]   Zenaida Niece 04/11/2022, 8:43 AM

## 2022-04-11 NOTE — Progress Notes (Signed)
Mobility Specialist Progress Note   04/11/22 1240  Mobility  Activity Ambulated with assistance in room  Level of Assistance Minimal assist, patient does 75% or more  Assistive Device [facility] Ambulated (ft) 18 ft  Range of Motion/Exercises Active;All extremities  LUE Weight Bearing NWB  Activity Response Tolerated well   Patient received in supine agreeable to participate. Stood and ambulated  with unsteady gait requiring min-mod A. C/o dizziness but all VSS. Returned to BED without incident. Was left in supine with all needs met, call bell in reach.   [place] Azora Bonzo, [place], [place]  Office: [phone]

## 2022-04-11 NOTE — Progress Notes (Signed)
PROGRESS NOTE  [name] WJX:[id number] DOB: 06-Apr-1951 DOA: 04/09/2022 PCP: Reynold Bowen, MD   LOS: 2 days   Brief Narrative / Interim history: 71 year old female with history of breast cancer status post chemo and radiation therapy, currently on letrozole, chronic iron deficiency anemia, chronic diastolic CHF, HTN, HLD, chronic hyponatremia with a sodium of around 130 comes into the hospital with weakness, recent fall.  She was found to have a left proximal humerus fracture, was also found to be hyponatremic with a sodium as low as 116.  Subjective / 24h Interval events: Felt weak with PT, SNF has been recommended however she wants to go home  Assesement and Plan: Principal Problem:   Acute hyponatremia Active Problems:   AKI (acute kidney injury) ([place])   HLD (hyperlipidemia)   Hypokalemia   Traumatic closed displaced fracture of proximal end of left humerus   Chronic diastolic CHF (congestive heart failure) (HCC)   Chronic iron deficiency anemia   Fall   Closed fracture of left proximal humerus   Elevated CPK   Protein-calorie malnutrition, severe   Principal problem Hypochloremic hyponatremia-with concern for poor p.o. intake, however she tells me she drinks about 4 large water canisters of water per day (32 ounce size).  Place patient on fluid restriction, continue to monitor sodium levels.  There is a chronicity to her hyponatremia, probably also has poor solute intake, add salt tabs this morning. Overall better  Active problems Fall with left proximal humerus fracture-orthopedic surgery consulted, recommending nonoperative treatment.  PT/OT  Hypokalemia-replete and continue to monitor  Chronic iron deficiency anemia -hemoglobin stable  Chronic diastolic CHF -appears euvolemic  Breast cancer -outpatient follow-up   Scheduled Meds:  clopidogrel  75 mg Oral Daily   enoxaparin (LOVENOX) injection  30 mg Subcutaneous Daily   feeding supplement  237 mL Oral BID  BM   iron polysaccharides  150 mg Oral Daily   letrozole  2.5 mg Oral Daily   Neratinib Maleate  240 mg Oral Daily   sodium chloride  2 g Oral TID WC   Continuous Infusions:  sodium chloride     PRN Meds:.acetaminophen **OR** acetaminophen, HYDROcodone-acetaminophen, naLOXone (NARCAN)  injection  Current Outpatient Medications  Medication Instructions   acetaminophen (TYLENOL) 650 mg, Oral, Every 6 hours PRN   clopidogrel (PLAVIX) 75 MG tablet TAKE 1 TABLET BY MOUTH EVERY DAY   cyanocobalamin (VITAMIN B12) 1,000 mcg, Intramuscular, Every 30 days   docusate sodium (COLACE) 100 mg, Oral, Daily PRN   DULoxetine (CYMBALTA) 30 mg, Oral, Daily   ezetimibe (ZETIA) 10 mg, Oral, Daily   gabapentin (NEURONTIN) 300 mg, Oral, 2 times daily   iron polysaccharides (NIFEREX) 150 mg, Oral, Daily   letrozole (FEMARA) 2.5 mg, Oral, Daily   loperamide (IMODIUM) 2 mg, Oral, Daily PRN   Neratinib Maleate (NERLYNX) 160 mg, Oral, Daily, 4 tablets = 160 mg   oxyCODONE (ROXICODONE) 2.5-5 mg, Oral, Every 4 hours PRN   senna (SENOKOT) 8.6 mg, Oral, 2 times daily   simvastatin (ZOCOR) 40 MG tablet TAKE 1 TABLET(40 MG) BY MOUTH DAILY AT 6 PM   Vitamin D (Cholecalciferol) 2,000 Units, Oral, Daily    Diet Orders (From admission, onward)     Start     Ordered   04/11/22 1234  Diet regular Room service appropriate? No; Fluid consistency: Thin; Fluid restriction: 1200 mL Fluid  Diet effective now       Question Answer Comment  Room service appropriate? No   Fluid consistency: Thin  Fluid restriction: 1200 mL Fluid      04/11/22 1233            DVT prophylaxis: enoxaparin (LOVENOX) injection 30 mg Start: 04/10/22 1200 SCDs Start: 04/09/22 2255   Lab Results  Component Value Date   PLT 130 (L) 04/11/2022      Code Status: Full Code  Family Communication: no family at bedside   Status is: Inpatient  Remains inpatient appropriate because: Persistently hyponatremic   Level of care:  Progressive  Consultants:  none  Objective: Vitals:   04/11/22 0527 04/11/22 0631 04/11/22 0755 04/11/22 1222  BP:  (!) 151/79 (!) 141/76 114/60  Pulse:  69 67 70  Resp:  '18 18 18  '$ Temp: 98.1 F (36.7 C) 98.3 F (36.8 C) 98.6 F (37 C) 97.7 F (36.5 C)  TempSrc:  Oral Oral Oral  SpO2:  95% 94% 95%  Weight:      Height:        Intake/Output Summary (Last 24 hours) at 04/11/2022 1556 Last data filed at 04/11/2022 0930 Gross per 24 hour  Intake 120 ml  Output --  Net 120 ml   Wt Readings from Last 3 Encounters:  04/09/22 42.6 kg  03/18/22 44 kg  11/28/21 41.7 kg    Examination:  Constitutional: NAD Eyes: lids and conjunctivae normal, no scleral icterus ENMT: mmm Neck: normal, supple Respiratory: clear to auscultation bilaterally, no wheezing, no crackles. Normal respiratory effort.  Cardiovascular: Regular rate and rhythm, no murmurs / rubs / gallops. No LE edema. Abdomen: soft, no distention, no tenderness. Bowel sounds positive.    Data Reviewed: I have independently reviewed following labs and imaging studies   CBC Recent Labs  Lab 04/09/22 2105 04/09/22 2114 04/10/22 0256 04/11/22 0341  WBC 7.3  --  6.0 3.8*  HGB 10.4* 10.9* 9.1* 9.2*  HCT 28.4* 32.0* 25.5* 25.0*  PLT 180  --  137* 130*  MCV 92.2  --  94.4 95.4  MCH 33.8  --  33.7 35.1*  MCHC 36.6*  --  35.7 36.8*  RDW 12.9  --  13.1 13.2  LYMPHSABS  --   --  0.6*  --   MONOABS  --   --  0.8  --   EOSABS  --   --  0.0  --   BASOSABS  --   --  0.0  --      Recent Labs  Lab 04/09/22 2105 04/09/22 2114 04/10/22 0256 04/10/22 0934 04/10/22 1726 04/11/22 0341 04/11/22 0850  NA 119*   < > 120* 121* 123* 123* 124*  K 2.9*   < > 3.2* 4.2 4.5 4.3 4.6  CL 80*   < > 85* 90* 93* 92* 95*  CO2 23  --  '22 24 23 24 '$ 20*  GLUCOSE 132*   < > 121* 93 114* 94 98  BUN 17   < > '16 11 10 11 9  '$ CREATININE 0.99   < > 0.75 0.71 0.69 0.81 0.70  CALCIUM 8.9  --  8.3* 8.4* 8.4* 8.4* 8.4*  AST 34  --  24  --    --  24  --   ALT 15  --  13  --   --  14  --   ALKPHOS 45  --  40  --   --  40  --   BILITOT 0.6  --  0.3  --   --  0.6  --   ALBUMIN 4.0  --  3.1*  --   --  3.2*  --   MG  --   --  1.3*  --   --  1.8  --   LATICACIDVEN 1.3  --   --   --   --   --   --   INR 1.0  --   --   --   --   --   --   TSH  --   --  1.031  --   --   --   --   BNP  --   --  5.7  --   --   --   --    < > = values in this interval not displayed.     ------------------------------------------------------------------------------------------------------------------ No results for input(s): "CHOL", "HDL", "LDLCALC", "TRIG", "CHOLHDL", "LDLDIRECT" in the last 72 hours.  Lab Results  Component Value Date   HGBA1C 5.6 08/10/2018   ------------------------------------------------------------------------------------------------------------------ Recent Labs    04/10/22 0256  TSH 1.031     Cardiac Enzymes No results for input(s): "CKMB", "TROPONINI", "MYOGLOBIN" in the last 168 hours.  Invalid input(s): "CK" ------------------------------------------------------------------------------------------------------------------    Component Value Date/Time   BNP 5.7 04/10/2022 0256    CBG: No results for input(s): "GLUCAP" in the last 168 hours.  Recent Results (from the past 240 hour(s))  Urine Culture     Status: Abnormal   Collection Time: 04/09/22  9:07 PM   Specimen: Urine, Clean Catch  Result Value Ref Range Status   Specimen Description URINE, CLEAN CATCH  Final   Special Requests   Final    NONE Performed at [facility], [address] [place], [place] [address]    Culture MULTIPLE SPECIES PRESENT, SUGGEST RECOLLECTION (A)  Final   Report Status 04/11/2022 FINAL  Final     Radiology Studies: No results found.   Marzetta Board, MD, PhD [facility]  Between 7 am - 7 pm I am available, please contact me via Amion (for emergencies) or Securechat (non urgent messages)  Between 7  pm - 7 am I am not available, please contact night coverage MD/APP via Amion

## 2022-04-11 NOTE — Progress Notes (Signed)
Initial Nutrition Assessment  DOCUMENTATION CODES:   Severe malnutrition in context of chronic illness  INTERVENTION:   - Ensure Enlive po BID, each supplement provides 350 kcal and 20 grams of protein  - Encourage PO intake  NUTRITION DIAGNOSIS:   Severe Malnutrition related to chronic illness (CHF, breast cancer) as evidenced by severe fat depletion, severe muscle depletion.  GOAL:   Patient will meet greater than or equal to 90% of their needs  MONITOR:   PO intake, Supplement acceptance, Labs, Weight trends  REASON FOR ASSESSMENT:   Malnutrition Screening Tool    ASSESSMENT:   71 year old female who presented to the ED on 11/7 after a fall. PMH of breast cancer s/p chemotherapy and radiation, chronic iron deficiency anemia, CHF, HTN, HLD, chronic hyponatremia, CAD s/p stenting x 2. Pt admitted with hyponatremia, L proximal humerus fx.  Spoke with pt at bedside. Pt reports that her appetite in the hospital is improved compared to her appetite PTA. Pt reports that decreased appetite began 2-3 years ago when she first started receiving treatment for breast cancer. Pt reports that she typically eats 1 main meal daily that may include chicken fingers. Pt does snack throughout the day on items like cookies and chips. In addition to snacking, pt drinks 2 Ensure supplements daily between meals. She states that she drinks a lot of water.  Pt endorses weight loss since the start of her cancer treatments. Pt reports that she used to weigh 140 lbs and now weighs around 94 lbs. Pt believes that her weight has no stabilized and does not think she is continuing to lose weight. Reviewed weight history in chart. Noted pt with a 3.2 kg weight loss since 10/05/21. This is a 7% weight loss in 6 months which is not clinically significant for timeframe but is concerning. Based on NFPE, pt meets criteria for severe malnutrition.  Pt reports previously being on such a high dose of chemotherapy  medication that it caused her diarrhea. She states that the dose has decreased and the diarrhea has improved.  Pt willing to consume oral nutrition supplements during this admission. RD to order Ensure between meals. Discussed importance of adequate PO intake to prevent additional weight loss and promote maintenance of lean body mass. Pt expresses understanding.  Meal Completion: 50% x 1 documented meal  Medications reviewed and include: niferex, sodium chloride 2 grams TID  Labs reviewed: sodium 124, chloride 95, WBC 3.8, hemoglobin 9.2, platelets 130  NUTRITION - FOCUSED PHYSICAL EXAM:  Flowsheet Row Most Recent Value  Orbital Region Moderate depletion  Upper Arm Region Severe depletion  Thoracic and Lumbar Region Severe depletion  Buccal Region Moderate depletion  Temple Region Moderate depletion  Clavicle Bone Region Severe depletion  Clavicle and Acromion Bone Region Severe depletion  Scapular Bone Region Severe depletion  Dorsal Hand Moderate depletion  Patellar Region Severe depletion  Anterior Thigh Region Severe depletion  Posterior Calf Region Severe depletion  Edema (RD Assessment) None  Hair Reviewed  Eyes Reviewed  Mouth Reviewed  Skin Reviewed  Nails Reviewed       Diet Order:   Diet Order             Diet regular Room service appropriate? No; Fluid consistency: Thin; Fluid restriction: 1200 mL Fluid  Diet effective now                   EDUCATION NEEDS:   Education needs have been addressed  Skin:  Skin Assessment: Reviewed RN  Assessment  Last BM:  04/10/22  Height:   Ht Readings from Last 1 Encounters:  04/09/22 '5\' 4"'$  (1.626 m)    Weight:   Wt Readings from Last 1 Encounters:  04/09/22 42.6 kg    Ideal Body Weight:  54.5 kg  BMI:  Body mass index is 16.14 kg/m.  Estimated Nutritional Needs:   Kcal:  1650-1850  Protein:  70-85 grams  Fluid:  1.6-1.8 L    Gustavus Bryant, MS, RD, LDN Inpatient Clinical Dietitian Please see  AMiON for contact information.

## 2022-04-12 DIAGNOSIS — E871 Hypo-osmolality and hyponatremia: Secondary | ICD-10-CM | POA: Diagnosis not present

## 2022-04-12 LAB — BASIC METABOLIC PANEL
Anion gap: 13 (ref 5–15)
BUN: 14 mg/dL (ref 8–23)
CO2: 22 mmol/L (ref 22–32)
Calcium: 8.7 mg/dL — ABNORMAL LOW (ref 8.9–10.3)
Chloride: 93 mmol/L — ABNORMAL LOW (ref 98–111)
Creatinine, Ser: 0.67 mg/dL (ref 0.44–1.00)
GFR, Estimated: >60 mL/min (ref >60)
Glucose, Bld: 94 mg/dL (ref 70–99)
Potassium: 4.8 mmol/L (ref 3.5–5.1)
Sodium: 128 mmol/L — ABNORMAL LOW (ref 135–145)

## 2022-04-12 MED ORDER — SODIUM CHLORIDE 1 G PO TABS: 1.0000 g | ORAL_TABLET | Freq: Three times a day (TID) | ORAL | 0 refills | Status: AC

## 2022-04-12 MED ORDER — OXYCODONE HCL 5 MG PO TABS: 2.5000 mg | ORAL_TABLET | ORAL | 0 refills | Status: AC | PRN

## 2022-04-12 NOTE — TOC Transition Note (Signed)
Transition of Care [facility]) - CM/SW Discharge Note   Patient Details  Name: [name] MRN: [id number] Date of Birth: August 15, 1950  [facility] CM/SW Contact:  Verdell Carmine, RN Phone Number: 04/12/2022, 8:43 AM   Clinical Narrative:     Patients first choice was [facility] for home health, but they had to decline due to PT services. [facility] home health accepted ( [place]) . No further needs identified.   Final next level of care: Home w Home Health Services Barriers to Discharge: No Barriers Identified (refused SNF)   Patient Goals and CMS Choice     Choice offered to / list presented to : Patient  Discharge Placement                 Home with Home Health      Discharge Plan and Services   Discharge Planning Services: CM Consult Post Acute Care Choice: Home Health                    HH Arranged: PT, OT [facility] Agency: [place] Date [place]: 04/12/22 Time [place]: [phone] Representative spoke with at [place]: [place]  Social Determinants of Health ([place]) Interventions     Readmission Risk Interventions     No data to display

## 2022-04-12 NOTE — Care Management Important Message (Signed)
Important Message  Patient Details  Name: [name] MRN: [id number] Date of Birth: 04/13/1951   Medicare Important Message Given:  Yes     Orbie Pyo 04/12/2022, 4:23 PM

## 2022-04-12 NOTE — Discharge Summary (Signed)
Physician Discharge Summary  [name] QTM:[id number] DOB: 04-Aug-1950 DOA: 04/09/2022  PCP: Reynold Bowen, MD  Admit date: 04/09/2022 Discharge date: 04/12/2022  Admitted From: home Disposition:  home  Recommendations for Outpatient Follow-up:  Follow up with PCP in 1-2 weeks Please obtain BMP in 5 days  Home Health: PT (refused SNF) Equipment/Devices: none  Discharge Condition: stable CODE STATUS: Full code Diet Orders (From admission, onward)     Start     Ordered   04/11/22 1234  Diet regular Room service appropriate? No; Fluid consistency: Thin; Fluid restriction: 1200 mL Fluid  Diet effective now       Question Answer Comment  Room service appropriate? No   Fluid consistency: Thin   Fluid restriction: 1200 mL Fluid      04/11/22 1233            Brief Narrative / Interim history: 71 year old female with history of breast cancer status post chemo and radiation therapy, currently on letrozole, chronic iron deficiency anemia, chronic diastolic CHF, HTN, HLD, chronic hyponatremia with a sodium of around 130 comes into the hospital with weakness, recent fall.  She was found to have a left proximal humerus fracture, was also found to be hyponatremic with a sodium as low as 116.  Hospital Course / Discharge diagnoses: Principal Problem:   Acute hyponatremia Active Problems:   AKI (acute kidney injury) ([place])   HLD (hyperlipidemia)   Hypokalemia   Traumatic closed displaced fracture of proximal end of left humerus   Chronic diastolic CHF (congestive heart failure) ([facility])   Chronic iron deficiency anemia   Fall   Closed fracture of left proximal humerus   Elevated CPK   Protein-calorie malnutrition, severe   Principal problem Acute on chronic hyponatremia-this is likely multifactorial, due to poor p.o. and in general poor solute intake, as well as drinking large amounts of water, 4 32-oz regular water canisters per day.  She initially received IV fluids as she  was felt to be slightly dehydrated on admission, following that she was placed on fluid restriction, started on salt tabs, with gradual improvement in her sodium level.  She has a degree of chronic hyponatremia in the last several months her sodium level varying between 127 and 135.  She was advised to continue salt tabs for the next 5 days following discharge, follow fluid restricted diet at home, I encouraged her to have good p.o. intake and was advised to follow-up with her PCP and have blood work done in the next 5 days.   Active problems Fall with left proximal humerus fracture-orthopedic surgery consulted, recommending nonoperative treatment.  PT/OT recommends SNF, however she declines that and home health PT/OT were arranged.  She will be given a short course of pain medications  Hypokalemia-replaced and normal upon discharge  Chronic iron deficiency anemia -hemoglobin stable Chronic diastolic CHF -appears euvolemic Severe protein calorie malnutrition-BMI 16 Breast cancer -currently on letrozole, status post chemo and radiation therapy, outpatient follow-up  Sepsis ruled out   Discharge Instructions   Allergies as of 04/12/2022   No Known Allergies      Medication List     STOP taking these medications    traMADol 50 MG tablet Commonly known as: ULTRAM       TAKE these medications    acetaminophen 325 MG tablet Commonly known as: TYLENOL Take 2 tablets (650 mg total) by mouth every 6 (six) hours as needed for mild pain (or Fever >/= 101).   clopidogrel 75  MG tablet Commonly known as: PLAVIX TAKE 1 TABLET BY MOUTH EVERY DAY   cyanocobalamin 1000 MCG/ML injection Commonly known as: VITAMIN B12 Inject 1,000 mcg into the muscle every 30 (thirty) days.   docusate sodium 100 MG capsule Commonly known as: COLACE Take 100 mg by mouth daily as needed for mild constipation.   DULoxetine 30 MG capsule Commonly known as: CYMBALTA Take 30 mg by mouth daily.   ezetimibe  10 MG tablet Commonly known as: ZETIA Take 10 mg by mouth daily.   gabapentin 300 MG capsule Commonly known as: NEURONTIN Take 1 capsule (300 mg total) by mouth 2 (two) times daily. What changed: when to take this   iron polysaccharides 150 MG capsule Commonly known as: NIFEREX Take 150 mg by mouth daily.   letrozole 2.5 MG tablet Commonly known as: FEMARA Take 2.5 mg by mouth daily.   loperamide 2 MG capsule Commonly known as: IMODIUM Take 2 mg by mouth daily as needed for diarrhea or loose stools.   Neratinib Maleate 40 MG tablet Commonly known as: NERLYNX Take 160 mg by mouth daily. 4 tablets = 160 mg   oxyCODONE 5 MG immediate release tablet Commonly known as: Roxicodone Take 0.5-1 tablets (2.5-5 mg total) by mouth every 4 (four) hours as needed for up to 5 days for severe pain.   senna 8.6 MG Tabs tablet Commonly known as: SENOKOT Take 1 tablet (8.6 mg total) by mouth 2 (two) times daily for 14 days.   simvastatin 40 MG tablet Commonly known as: ZOCOR TAKE 1 TABLET(40 MG) BY MOUTH DAILY AT 6 PM   sodium chloride 1 g tablet Take 1 tablet (1 g total) by mouth 3 (three) times daily with meals for 5 days.   Vitamin D (Cholecalciferol) 25 MCG (1000 UT) Caps Take 2,000 Units by mouth daily.       Consultations: none  Procedures/Studies:  CT HEAD WO CONTRAST  Result Date: 04/09/2022 CLINICAL DATA:  Trauma. EXAM: CT HEAD WITHOUT CONTRAST CT CERVICAL SPINE WITHOUT CONTRAST TECHNIQUE: Multidetector CT imaging of the head and cervical spine was performed following the standard protocol without intravenous contrast. Multiplanar CT image reconstructions of the cervical spine were also generated. RADIATION DOSE REDUCTION: This exam was performed according to the departmental dose-optimization program which includes automated exposure control, adjustment of the mA and/or kV according to patient size and/or use of iterative reconstruction technique. COMPARISON:  CT of the  head and C-spine dated 08/27/2021. FINDINGS: CT HEAD FINDINGS Brain: The ventricles and sulci are appropriate size for the patient's age. The gray-white matter discrimination is preserved. There is no acute intracranial hemorrhage. No mass effect or midline shift. No extra-axial fluid collection. Vascular: No hyperdense vessel or unexpected calcification. Skull: Normal. Negative for fracture or focal lesion. Sinuses/Orbits: No acute finding. Other: Mild left forehead contusion and midline occipital scalp contusion. CT CERVICAL SPINE FINDINGS Alignment: No acute subluxation.  Grade 1 C4-C5 anterolisthesis. Skull base and vertebrae: No acute fracture.  Osteopenia. Soft tissues and spinal canal: No prevertebral fluid or swelling. No visible canal hematoma. Disc levels:  No acute findings. Upper chest: Emphysema. Other: Partially visualized right-sided Port-A-Cath. Bilateral carotid bulb calcified plaques. IMPRESSION: 1. No acute intracranial pathology. 2. No acute/traumatic cervical spine pathology. Electronically Signed   By: Anner Crete M.D.   On: 04/09/2022 21:43   CT CERVICAL SPINE WO CONTRAST  Result Date: 04/09/2022 CLINICAL DATA:  Trauma. EXAM: CT HEAD WITHOUT CONTRAST CT CERVICAL SPINE WITHOUT CONTRAST TECHNIQUE: Multidetector CT imaging  of the head and cervical spine was performed following the standard protocol without intravenous contrast. Multiplanar CT image reconstructions of the cervical spine were also generated. RADIATION DOSE REDUCTION: This exam was performed according to the departmental dose-optimization program which includes automated exposure control, adjustment of the mA and/or kV according to patient size and/or use of iterative reconstruction technique. COMPARISON:  CT of the head and C-spine dated 08/27/2021. FINDINGS: CT HEAD FINDINGS Brain: The ventricles and sulci are appropriate size for the patient's age. The gray-white matter discrimination is preserved. There is no acute  intracranial hemorrhage. No mass effect or midline shift. No extra-axial fluid collection. Vascular: No hyperdense vessel or unexpected calcification. Skull: Normal. Negative for fracture or focal lesion. Sinuses/Orbits: No acute finding. Other: Mild left forehead contusion and midline occipital scalp contusion. CT CERVICAL SPINE FINDINGS Alignment: No acute subluxation.  Grade 1 C4-C5 anterolisthesis. Skull base and vertebrae: No acute fracture.  Osteopenia. Soft tissues and spinal canal: No prevertebral fluid or swelling. No visible canal hematoma. Disc levels:  No acute findings. Upper chest: Emphysema. Other: Partially visualized right-sided Port-A-Cath. Bilateral carotid bulb calcified plaques. IMPRESSION: 1. No acute intracranial pathology. 2. No acute/traumatic cervical spine pathology. Electronically Signed   By: Anner Crete M.D.   On: 04/09/2022 21:43   DG Shoulder Left Port  Result Date: 04/09/2022 CLINICAL DATA:  Dizziness, fall down stairs, left shoulder pain. EXAM: LEFT SHOULDER COMPARISON:  None Available. FINDINGS: Acute surgical neck fracture of the left proximal humerus with varus angulation mild comminution along the medial metaphysis. This is most likely a Neer 2 part fracture given the degree of angulation although CT could be utilized for definitive characterization. IMPRESSION: 1. Acute surgical neck fracture of the left proximal humerus with varus angulation and mild comminution. Electronically Signed   By: Van Clines M.D.   On: 04/09/2022 21:37   DG Chest Port 1 View  Result Date: 04/09/2022 CLINICAL DATA:  Fall down 2-3 stairs proceeded by dizziness. Blunt trauma, left shoulder/chest pain. EXAM: PORTABLE CHEST 1 VIEW COMPARISON:  Multiple exams, including 10/02/2021 and 01/14/2022 FINDINGS: Power injectable right Port-A-Cath tip: SVC. Atherosclerotic calcification of the aortic arch. Heart size within normal limits. Tapering of the peripheral pulmonary vasculature favors  emphysema. No blunting of the costophrenic angles. Clips along the left breast. Chronic deformity of the right proximal humerus compatible with a prior fracture. Acute deformity of the left proximal humerus compatible with acute left surgical neck fracture. IMPRESSION: 1. Acute surgical neck fracture the left proximal humerus. 2. Chronic deformity of the right proximal humerus compatible with a prior fracture. 3. Emphysema. 4. Atherosclerotic calcification of the aortic arch. Aortic Atherosclerosis (ICD10-I70.0) and Emphysema (ICD10-J43.9). Electronically Signed   By: Van Clines M.D.   On: 04/09/2022 21:35     Subjective: - no chest pain, shortness of breath, no abdominal pain, nausea or vomiting.   Discharge Exam: BP (!) 147/88 (BP Location: Right Arm)   Pulse 72   Temp 98 F (36.7 C) (Oral)   Resp 16   Ht '5\' 4"'$  (1.626 m)   Wt 42.3 kg   SpO2 92%   BMI 16.01 kg/m   General: Pt is alert, awake, not in acute distress Cardiovascular: RRR, S1/S2 +, no rubs, no gallops Respiratory: CTA bilaterally, no wheezing, no rhonchi Abdominal: Soft, NT, ND, bowel sounds + Extremities: no edema, no cyanosis   The results of significant diagnostics from this hospitalization (including imaging, microbiology, ancillary and laboratory) are listed below for reference.  Microbiology: Recent Results (from the past 240 hour(s))  Urine Culture     Status: Abnormal   Collection Time: 04/09/22  9:07 PM   Specimen: Urine, Clean Catch  Result Value Ref Range Status   Specimen Description URINE, CLEAN CATCH  Final   Special Requests   Final    NONE Performed at [facility], [place] [address] [place], [place] [address]    Culture MULTIPLE SPECIES PRESENT, SUGGEST RECOLLECTION (A)  Final   Report Status 04/11/2022 FINAL  Final     Labs: Basic Metabolic Panel: Recent Labs  Lab 04/10/22 0256 04/10/22 0934 04/10/22 1726 04/11/22 0341 04/11/22 0850 04/11/22 1523 04/12/22 0422  NA  120*   < > 123* 123* 124* 128* 128*  K 3.2*   < > 4.5 4.3 4.6 4.3 4.8  CL 85*   < > 93* 92* 95* 93* 93*  CO2 22   < > 23 24 20* 25 22  GLUCOSE 121*   < > 114* 94 98 89 94  BUN 16   < > '10 11 9 11 14  '$ CREATININE 0.75   < > 0.69 0.81 0.70 0.72 0.67  CALCIUM 8.3*   < > 8.4* 8.4* 8.4* 8.3* 8.7*  MG 1.3*  --   --  1.8  --   --   --    < > = values in this interval not displayed.   Liver Function Tests: Recent Labs  Lab 04/09/22 2105 04/10/22 0256 04/11/22 0341  AST 34 24 24  ALT '15 13 14  '$ ALKPHOS 45 40 40  BILITOT 0.6 0.3 0.6  PROT 6.3* 5.2* 5.4*  ALBUMIN 4.0 3.1* 3.2*   CBC: Recent Labs  Lab 04/09/22 2105 04/09/22 2114 04/10/22 0256 04/11/22 0341  WBC 7.3  --  6.0 3.8*  NEUTROABS  --   --  4.6  --   HGB 10.4* 10.9* 9.1* 9.2*  HCT 28.4* 32.0* 25.5* 25.0*  MCV 92.2  --  94.4 95.4  PLT 180  --  137* 130*   CBG: No results for input(s): "GLUCAP" in the last 168 hours. Hgb A1c No results for input(s): "HGBA1C" in the last 72 hours. Lipid Profile No results for input(s): "CHOL", "HDL", "LDLCALC", "TRIG", "CHOLHDL", "LDLDIRECT" in the last 72 hours. Thyroid function studies Recent Labs    04/10/22 0256  TSH 1.031   Urinalysis    Component Value Date/Time   COLORURINE YELLOW 04/10/2022 [place] 04/10/2022 0939   LABSPEC 1.008 04/10/2022 0939   PHURINE 6.0 04/10/2022 0939   GLUCOSEU NEGATIVE 04/10/2022 0939   HGBUR SMALL (A) 04/10/2022 0939   BILIRUBINUR NEGATIVE 04/10/2022 0939   KETONESUR NEGATIVE 04/10/2022 0939   PROTEINUR NEGATIVE 04/10/2022 0939   NITRITE NEGATIVE 04/10/2022 0939   LEUKOCYTESUR MODERATE (A) 04/10/2022 0939    FURTHER DISCHARGE INSTRUCTIONS:   Get Medicines reviewed and adjusted: Please take all your medications with you for your next visit with your Primary MD   Laboratory/radiological data: Please request your Primary MD to go over all hospital tests and procedure/radiological results at the follow up, please ask your  Primary MD to get all Hospital records sent to his/her office.   In some cases, they will be blood work, cultures and biopsy results pending at the time of your discharge. Please request that your primary care M.D. goes through all the records of your hospital data and follows up on these results.   Also Note the following: If you experience  worsening of your admission symptoms, develop shortness of breath, life threatening emergency, suicidal or homicidal thoughts you must seek medical attention immediately by calling 911 or calling your MD immediately  if symptoms less severe.   You must read complete instructions/literature along with all the possible adverse reactions/side effects for all the Medicines you take and that have been prescribed to you. Take any new Medicines after you have completely understood and accpet all the possible adverse reactions/side effects.    Do not drive when taking Pain medications or sleeping medications (Benzodaizepines)   Do not take more than prescribed Pain, Sleep and Anxiety Medications. It is not advisable to combine anxiety,sleep and pain medications without talking with your primary care practitioner   Special Instructions: If you have smoked or chewed Tobacco  in the last 2 yrs please stop smoking, stop any regular Alcohol  and or any Recreational drug use.   Wear Seat belts while driving.   Please note: You were cared for by a hospitalist during your hospital stay. Once you are discharged, your primary care physician will handle any further medical issues. Please note that NO REFILLS for any discharge medications will be authorized once you are discharged, as it is imperative that you return to your primary care physician (or establish a relationship with a primary care physician if you do not have one) for your post hospital discharge needs so that they can reassess your need for medications and monitor your lab values.  Time coordinating discharge: 40  minutes  SIGNED:  Marzetta Board, MD, PhD 04/12/2022, 7:21 AM

## 2022-04-13 ENCOUNTER — Other Ambulatory Visit: Payer: Self-pay | Admitting: Cardiovascular Disease

## 2022-04-14 DIAGNOSIS — S42302D Unspecified fracture of shaft of humerus, left arm, subsequent encounter for fracture with routine healing: Secondary | ICD-10-CM | POA: Diagnosis not present

## 2022-04-14 DIAGNOSIS — E441 Mild protein-calorie malnutrition: Secondary | ICD-10-CM | POA: Diagnosis not present

## 2022-04-14 DIAGNOSIS — Z681 Body mass index (BMI) 19 or less, adult: Secondary | ICD-10-CM | POA: Diagnosis not present

## 2022-04-14 DIAGNOSIS — W19XXXD Unspecified fall, subsequent encounter: Secondary | ICD-10-CM | POA: Diagnosis not present

## 2022-04-15 DIAGNOSIS — E441 Mild protein-calorie malnutrition: Secondary | ICD-10-CM | POA: Diagnosis not present

## 2022-04-15 DIAGNOSIS — Z7902 Long term (current) use of antithrombotics/antiplatelets: Secondary | ICD-10-CM | POA: Diagnosis not present

## 2022-04-15 DIAGNOSIS — E785 Hyperlipidemia, unspecified: Secondary | ICD-10-CM | POA: Diagnosis not present

## 2022-04-15 DIAGNOSIS — E871 Hypo-osmolality and hyponatremia: Secondary | ICD-10-CM | POA: Diagnosis not present

## 2022-04-15 DIAGNOSIS — D509 Iron deficiency anemia, unspecified: Secondary | ICD-10-CM | POA: Diagnosis not present

## 2022-04-15 DIAGNOSIS — R296 Repeated falls: Secondary | ICD-10-CM | POA: Diagnosis not present

## 2022-04-15 DIAGNOSIS — Z951 Presence of aortocoronary bypass graft: Secondary | ICD-10-CM | POA: Diagnosis not present

## 2022-04-15 DIAGNOSIS — I5030 Unspecified diastolic (congestive) heart failure: Secondary | ICD-10-CM | POA: Diagnosis not present

## 2022-04-15 DIAGNOSIS — S42202A Unspecified fracture of upper end of left humerus, initial encounter for closed fracture: Secondary | ICD-10-CM | POA: Diagnosis not present

## 2022-04-15 DIAGNOSIS — I110 Hypertensive heart disease with heart failure: Secondary | ICD-10-CM | POA: Diagnosis not present

## 2022-04-15 DIAGNOSIS — I2510 Atherosclerotic heart disease of native coronary artery without angina pectoris: Secondary | ICD-10-CM | POA: Diagnosis not present

## 2022-04-15 DIAGNOSIS — C50919 Malignant neoplasm of unspecified site of unspecified female breast: Secondary | ICD-10-CM | POA: Diagnosis not present

## 2022-04-22 DIAGNOSIS — R269 Unspecified abnormalities of gait and mobility: Secondary | ICD-10-CM | POA: Diagnosis not present

## 2022-04-22 DIAGNOSIS — S0101XA Laceration without foreign body of scalp, initial encounter: Secondary | ICD-10-CM | POA: Diagnosis not present

## 2022-04-22 DIAGNOSIS — I5032 Chronic diastolic (congestive) heart failure: Secondary | ICD-10-CM | POA: Diagnosis not present

## 2022-04-22 DIAGNOSIS — Z853 Personal history of malignant neoplasm of breast: Secondary | ICD-10-CM | POA: Diagnosis not present

## 2022-04-22 DIAGNOSIS — C50912 Malignant neoplasm of unspecified site of left female breast: Secondary | ICD-10-CM | POA: Diagnosis not present

## 2022-04-22 DIAGNOSIS — W19XXXA Unspecified fall, initial encounter: Secondary | ICD-10-CM | POA: Diagnosis not present

## 2022-04-22 DIAGNOSIS — E871 Hypo-osmolality and hyponatremia: Secondary | ICD-10-CM | POA: Diagnosis not present

## 2022-04-22 DIAGNOSIS — E876 Hypokalemia: Secondary | ICD-10-CM | POA: Diagnosis not present

## 2022-04-22 DIAGNOSIS — D509 Iron deficiency anemia, unspecified: Secondary | ICD-10-CM | POA: Diagnosis not present

## 2022-04-22 DIAGNOSIS — Z4802 Encounter for removal of sutures: Secondary | ICD-10-CM | POA: Diagnosis not present

## 2022-04-22 DIAGNOSIS — S42295A Other nondisplaced fracture of upper end of left humerus, initial encounter for closed fracture: Secondary | ICD-10-CM | POA: Diagnosis not present

## 2022-04-24 ENCOUNTER — Ambulatory Visit: Payer: PPO | Admitting: Orthopedic Surgery

## 2022-04-24 ENCOUNTER — Ambulatory Visit (INDEPENDENT_AMBULATORY_CARE_PROVIDER_SITE_OTHER): Payer: PPO

## 2022-04-24 DIAGNOSIS — M25512 Pain in left shoulder: Secondary | ICD-10-CM | POA: Diagnosis not present

## 2022-04-24 NOTE — Progress Notes (Signed)
Orthopedic Surgery Progress Note   Assessment: Patient is a 71 y.o. female with left proximal humerus fracture being treated non-operatively with healing as expected (approximately 2 weeks from injury)   Plan: -No acute operative intervention.  She can come out of the sling when at her house.  I instructed and demonstrated how she should do pendulum exercises.  She has had a prior right approximately wrist fracture so she has done these before and knows how to do them.  When she is out of the house she should be in the sling.  She will continue to be nonweightbearing on the left upper extremity.  Her pain is well-controlled with Tylenol so I told her to keep using that.  I will see her back in 4 weeks with left shoulder x-rays: grashey/axillary/scapular Y views.  ___________________________________________________________________________  Subjective: Patient is a 71 year old female who had a ground-level fall and sustained a left proximal humerus fracture.  I had seen her in the hospital and recommended nonoperative treatment.  She has been nonweightbearing and in a sling since that time.  Pain has been getting better with time.  She is now just using Tylenol for pain relief.  She is only using it periodically.  She has neuropathy in her hands and lower extremities.  There have been no changes in her sensation and no new paresthesias in her left upper extremity.   Physical Exam:  General: no acute distress, appears stated age Neurologic: alert, answering questions appropriately, following commands Respiratory: unlabored breathing on room air, symmetric chest rise Psychiatric: appropriate affect, normal cadence to speech  MSK:   -Left upper extremity  Tender to palpation over the shoulder, otherwise nontender to palpation over the remainder of the extremity  No gross deformity  Ecchymosis over the anterior arm that appears to be resolving Fires deltoid, biceps, triceps, wrist extensors,  wrist flexors, finger extensors, finger flexors  AIN/PIN/IO intact  Palpable radial pulse  Sensation intact to light touch in median/ulnar/radial/axillary nerve distributions (decreased distal to wrist)  Hand warm and well perfused   Patient name: [name] Patient MRN: [id number] Date: 04/24/22

## 2022-05-02 DIAGNOSIS — E538 Deficiency of other specified B group vitamins: Secondary | ICD-10-CM | POA: Diagnosis not present

## 2022-05-02 DIAGNOSIS — Z79899 Other long term (current) drug therapy: Secondary | ICD-10-CM | POA: Diagnosis not present

## 2022-05-02 DIAGNOSIS — I10 Essential (primary) hypertension: Secondary | ICD-10-CM | POA: Diagnosis not present

## 2022-05-02 DIAGNOSIS — E530 Riboflavin deficiency: Secondary | ICD-10-CM | POA: Diagnosis not present

## 2022-05-02 DIAGNOSIS — Z7902 Long term (current) use of antithrombotics/antiplatelets: Secondary | ICD-10-CM | POA: Diagnosis not present

## 2022-05-02 DIAGNOSIS — M8580 Other specified disorders of bone density and structure, unspecified site: Secondary | ICD-10-CM | POA: Diagnosis not present

## 2022-05-02 DIAGNOSIS — D509 Iron deficiency anemia, unspecified: Secondary | ICD-10-CM | POA: Diagnosis not present

## 2022-05-02 DIAGNOSIS — Z79811 Long term (current) use of aromatase inhibitors: Secondary | ICD-10-CM | POA: Diagnosis not present

## 2022-05-02 DIAGNOSIS — Z803 Family history of malignant neoplasm of breast: Secondary | ICD-10-CM | POA: Diagnosis not present

## 2022-05-02 DIAGNOSIS — I2510 Atherosclerotic heart disease of native coronary artery without angina pectoris: Secondary | ICD-10-CM | POA: Diagnosis not present

## 2022-05-02 DIAGNOSIS — F17210 Nicotine dependence, cigarettes, uncomplicated: Secondary | ICD-10-CM | POA: Diagnosis not present

## 2022-05-02 DIAGNOSIS — G629 Polyneuropathy, unspecified: Secondary | ICD-10-CM | POA: Diagnosis not present

## 2022-05-02 DIAGNOSIS — Z923 Personal history of irradiation: Secondary | ICD-10-CM | POA: Diagnosis not present

## 2022-05-02 DIAGNOSIS — C50912 Malignant neoplasm of unspecified site of left female breast: Secondary | ICD-10-CM | POA: Diagnosis not present

## 2022-05-02 DIAGNOSIS — C7989 Secondary malignant neoplasm of other specified sites: Secondary | ICD-10-CM | POA: Diagnosis not present

## 2022-05-07 DIAGNOSIS — I5032 Chronic diastolic (congestive) heart failure: Secondary | ICD-10-CM | POA: Diagnosis not present

## 2022-05-07 DIAGNOSIS — C50912 Malignant neoplasm of unspecified site of left female breast: Secondary | ICD-10-CM | POA: Diagnosis not present

## 2022-05-07 DIAGNOSIS — I110 Hypertensive heart disease with heart failure: Secondary | ICD-10-CM | POA: Diagnosis not present

## 2022-05-07 DIAGNOSIS — E871 Hypo-osmolality and hyponatremia: Secondary | ICD-10-CM | POA: Diagnosis not present

## 2022-05-07 DIAGNOSIS — D509 Iron deficiency anemia, unspecified: Secondary | ICD-10-CM | POA: Diagnosis not present

## 2022-05-07 DIAGNOSIS — E876 Hypokalemia: Secondary | ICD-10-CM | POA: Diagnosis not present

## 2022-05-07 DIAGNOSIS — S42295A Other nondisplaced fracture of upper end of left humerus, initial encounter for closed fracture: Secondary | ICD-10-CM | POA: Diagnosis not present

## 2022-05-08 DIAGNOSIS — S42202A Unspecified fracture of upper end of left humerus, initial encounter for closed fracture: Secondary | ICD-10-CM | POA: Diagnosis not present

## 2022-05-08 DIAGNOSIS — R296 Repeated falls: Secondary | ICD-10-CM | POA: Diagnosis not present

## 2022-05-08 DIAGNOSIS — D509 Iron deficiency anemia, unspecified: Secondary | ICD-10-CM | POA: Diagnosis not present

## 2022-05-08 DIAGNOSIS — I2510 Atherosclerotic heart disease of native coronary artery without angina pectoris: Secondary | ICD-10-CM | POA: Diagnosis not present

## 2022-05-08 DIAGNOSIS — E441 Mild protein-calorie malnutrition: Secondary | ICD-10-CM | POA: Diagnosis not present

## 2022-05-08 DIAGNOSIS — C50919 Malignant neoplasm of unspecified site of unspecified female breast: Secondary | ICD-10-CM | POA: Diagnosis not present

## 2022-05-08 DIAGNOSIS — E785 Hyperlipidemia, unspecified: Secondary | ICD-10-CM | POA: Diagnosis not present

## 2022-05-08 DIAGNOSIS — E871 Hypo-osmolality and hyponatremia: Secondary | ICD-10-CM | POA: Diagnosis not present

## 2022-05-08 DIAGNOSIS — I5030 Unspecified diastolic (congestive) heart failure: Secondary | ICD-10-CM | POA: Diagnosis not present

## 2022-05-08 DIAGNOSIS — I110 Hypertensive heart disease with heart failure: Secondary | ICD-10-CM | POA: Diagnosis not present

## 2022-05-22 ENCOUNTER — Ambulatory Visit (INDEPENDENT_AMBULATORY_CARE_PROVIDER_SITE_OTHER): Payer: PPO

## 2022-05-22 ENCOUNTER — Ambulatory Visit: Payer: PPO | Admitting: Orthopedic Surgery

## 2022-05-22 DIAGNOSIS — M25512 Pain in left shoulder: Secondary | ICD-10-CM

## 2022-05-22 NOTE — Progress Notes (Signed)
Orthopedic Surgery Progress Note     Assessment: Patient is a 71 y.o. female with left proximal humerus fracture being treated non-operatively with healing as expected (approximately 6 weeks from injury)     Plan: -No acute operative intervention.  Continue with pendulum swings. Can start wall crawls with her hand. Can continue to work with home PT.  She will be coffee-cup weight bearing on the left upper extremity. Use the sling when out of the house. Keep using tylenol for pain relief as needed.  I will see her back in 6 weeks with left shoulder x-rays: grashey/axillary/scapular Y views.   ___________________________________________________________________________   Subjective: Patient is a 71 year old female who had a ground-level fall and sustained a left proximal humerus fracture.  Pain has continued to improve with time.  She is rarely using Tylenol now.  She has been doing wall crawls and pendulum exercises.  She has home physical therapy that has been working with her.  Denies paresthesias and numbness.     Physical Exam:   General: no acute distress, appears stated age Neurologic: alert, answering questions appropriately, following commands Respiratory: unlabored breathing on room air, symmetric chest rise Psychiatric: appropriate affect, normal cadence to speech   MSK:    -Left upper extremity             Nontender to palpation over the shoulder and the remainder of the extremity, no gross deformity  Was able to forward flex and abduct her arm to 90 degrees without pain Fires deltoid, biceps, triceps, wrist extensors, wrist flexors, finger extensors, finger flexors             AIN/PIN/IO intact             Palpable radial pulse             Sensation intact to light touch in median/ulnar/radial/axillary nerve distributions (decreased distal to wrist)             Hand warm and well perfused     Imaging: XR of the left shoulder taken 05/22/2022 was independently reviewed and  interpreted, showing varus alignment. Glenohumeral joint reduced. Early callus formation seen. No change in displacement or alignment since films on 04/24/2022.   Patient name: [name] Patient MRN: [id number] Date: 05/22/22

## 2022-05-29 DIAGNOSIS — I5022 Chronic systolic (congestive) heart failure: Secondary | ICD-10-CM | POA: Diagnosis not present

## 2022-05-29 DIAGNOSIS — Z681 Body mass index (BMI) 19 or less, adult: Secondary | ICD-10-CM | POA: Diagnosis not present

## 2022-05-29 DIAGNOSIS — Z515 Encounter for palliative care: Secondary | ICD-10-CM | POA: Diagnosis not present

## 2022-06-18 DIAGNOSIS — I739 Peripheral vascular disease, unspecified: Secondary | ICD-10-CM | POA: Diagnosis not present

## 2022-06-18 DIAGNOSIS — G609 Hereditary and idiopathic neuropathy, unspecified: Secondary | ICD-10-CM | POA: Diagnosis not present

## 2022-06-18 DIAGNOSIS — I700 Atherosclerosis of aorta: Secondary | ICD-10-CM | POA: Diagnosis not present

## 2022-06-18 DIAGNOSIS — I2510 Atherosclerotic heart disease of native coronary artery without angina pectoris: Secondary | ICD-10-CM | POA: Diagnosis not present

## 2022-06-18 DIAGNOSIS — K7689 Other specified diseases of liver: Secondary | ICD-10-CM | POA: Diagnosis not present

## 2022-06-18 DIAGNOSIS — E871 Hypo-osmolality and hyponatremia: Secondary | ICD-10-CM | POA: Diagnosis not present

## 2022-06-18 DIAGNOSIS — E785 Hyperlipidemia, unspecified: Secondary | ICD-10-CM | POA: Diagnosis not present

## 2022-06-18 DIAGNOSIS — Z780 Asymptomatic menopausal state: Secondary | ICD-10-CM | POA: Diagnosis not present

## 2022-06-18 DIAGNOSIS — Z853 Personal history of malignant neoplasm of breast: Secondary | ICD-10-CM | POA: Diagnosis not present

## 2022-06-18 DIAGNOSIS — D509 Iron deficiency anemia, unspecified: Secondary | ICD-10-CM | POA: Diagnosis not present

## 2022-06-18 DIAGNOSIS — I714 Abdominal aortic aneurysm, without rupture, unspecified: Secondary | ICD-10-CM | POA: Diagnosis not present

## 2022-06-18 DIAGNOSIS — I10 Essential (primary) hypertension: Secondary | ICD-10-CM | POA: Diagnosis not present

## 2022-06-20 DIAGNOSIS — I2510 Atherosclerotic heart disease of native coronary artery without angina pectoris: Secondary | ICD-10-CM | POA: Diagnosis not present

## 2022-06-20 DIAGNOSIS — C50412 Malignant neoplasm of upper-outer quadrant of left female breast: Secondary | ICD-10-CM | POA: Diagnosis not present

## 2022-06-20 DIAGNOSIS — Z79811 Long term (current) use of aromatase inhibitors: Secondary | ICD-10-CM | POA: Diagnosis not present

## 2022-06-20 DIAGNOSIS — I10 Essential (primary) hypertension: Secondary | ICD-10-CM | POA: Diagnosis not present

## 2022-06-20 DIAGNOSIS — I252 Old myocardial infarction: Secondary | ICD-10-CM | POA: Diagnosis not present

## 2022-06-20 DIAGNOSIS — Z170 Estrogen receptor positive status [ER+]: Secondary | ICD-10-CM | POA: Diagnosis not present

## 2022-06-20 DIAGNOSIS — Z79899 Other long term (current) drug therapy: Secondary | ICD-10-CM | POA: Diagnosis not present

## 2022-06-20 DIAGNOSIS — M8580 Other specified disorders of bone density and structure, unspecified site: Secondary | ICD-10-CM | POA: Diagnosis not present

## 2022-06-20 DIAGNOSIS — D509 Iron deficiency anemia, unspecified: Secondary | ICD-10-CM | POA: Diagnosis not present

## 2022-06-20 DIAGNOSIS — E530 Riboflavin deficiency: Secondary | ICD-10-CM | POA: Diagnosis not present

## 2022-06-20 DIAGNOSIS — Z7902 Long term (current) use of antithrombotics/antiplatelets: Secondary | ICD-10-CM | POA: Diagnosis not present

## 2022-06-20 DIAGNOSIS — C50912 Malignant neoplasm of unspecified site of left female breast: Secondary | ICD-10-CM | POA: Diagnosis not present

## 2022-06-20 DIAGNOSIS — G629 Polyneuropathy, unspecified: Secondary | ICD-10-CM | POA: Diagnosis not present

## 2022-07-03 ENCOUNTER — Ambulatory Visit: Payer: PPO | Admitting: Orthopedic Surgery

## 2022-07-03 ENCOUNTER — Ambulatory Visit (INDEPENDENT_AMBULATORY_CARE_PROVIDER_SITE_OTHER): Payer: PPO

## 2022-07-03 DIAGNOSIS — S42201D Unspecified fracture of upper end of right humerus, subsequent encounter for fracture with routine healing: Secondary | ICD-10-CM

## 2022-07-03 DIAGNOSIS — M25512 Pain in left shoulder: Secondary | ICD-10-CM

## 2022-07-03 DIAGNOSIS — S42202D Unspecified fracture of upper end of left humerus, subsequent encounter for fracture with routine healing: Secondary | ICD-10-CM

## 2022-07-03 NOTE — Progress Notes (Signed)
Orthopedic Surgery Progress Note     Assessment: Patient is a 72 y.o. female with left proximal humerus fracture being treated non-operatively with healing as expected (approximately 12 weeks from injury) Date of injury: 04/10/2022     Plan: -No acute operative intervention -Weight-bear as tolerated left upper extremity -Range of motion as tolerated -OTC medications for pain relief -If doing well at our next visit, will have her follow up PRN -Follow-up in 3 months, x-rays at next visit: grashey/axillary/scapular Y views   ___________________________________________________________________________   Subjective: Patient is a 72 year old female who had a ground-level fall and sustained a left proximal humerus fracture.  Has been doing well since time I saw her.  She is not having any pain in the shoulder.  She is able to use her shoulder as she pleases without pain.  She can reach overhead without pain.     Physical Exam:   General: no acute distress, appears stated age Neurologic: alert, answering questions appropriately, following commands Respiratory: unlabored breathing on room air, symmetric chest rise Psychiatric: appropriate affect, normal cadence to speech   MSK:    -Left upper extremity             Nontender to palpation over the shoulder and the remainder of the extremity, no gross deformity             Was able to forward flex and abduct her arm to 100 degrees without pain Fires deltoid, biceps, triceps, wrist extensors, wrist flexors, finger extensors, finger flexors             AIN/PIN/IO intact             Palpable radial pulse             Sensation intact to light touch in median/ulnar/radial/axillary nerve distributions (decreased distal to wrist)             Hand warm and well perfused     Imaging: XR of the left shoulder taken 07/03/2022 was independently reviewed and interpreted, showing proximal humerus fracture with varus alignment.  Humerus contained within  the glenohumeral joint.  Interval callus formation seen.  No new fractures seen.   Patient name: DINAH LUPA Patient MRN: [id number] Date: 07/03/22

## 2022-07-04 DIAGNOSIS — Z9012 Acquired absence of left breast and nipple: Secondary | ICD-10-CM | POA: Diagnosis not present

## 2022-07-04 DIAGNOSIS — Z853 Personal history of malignant neoplasm of breast: Secondary | ICD-10-CM | POA: Diagnosis not present

## 2022-07-04 DIAGNOSIS — Z1231 Encounter for screening mammogram for malignant neoplasm of breast: Secondary | ICD-10-CM | POA: Diagnosis not present

## 2022-07-12 ENCOUNTER — Other Ambulatory Visit: Payer: Self-pay | Admitting: Cardiovascular Disease

## 2022-07-24 DIAGNOSIS — Z515 Encounter for palliative care: Secondary | ICD-10-CM | POA: Diagnosis not present

## 2022-07-24 DIAGNOSIS — I5030 Unspecified diastolic (congestive) heart failure: Secondary | ICD-10-CM | POA: Diagnosis not present

## 2022-07-24 DIAGNOSIS — I110 Hypertensive heart disease with heart failure: Secondary | ICD-10-CM | POA: Diagnosis not present

## 2022-08-20 DIAGNOSIS — Z08 Encounter for follow-up examination after completed treatment for malignant neoplasm: Secondary | ICD-10-CM | POA: Diagnosis not present

## 2022-08-20 DIAGNOSIS — C773 Secondary and unspecified malignant neoplasm of axilla and upper limb lymph nodes: Secondary | ICD-10-CM | POA: Diagnosis not present

## 2022-08-20 DIAGNOSIS — Z9181 History of falling: Secondary | ICD-10-CM | POA: Diagnosis not present

## 2022-08-20 DIAGNOSIS — M899 Disorder of bone, unspecified: Secondary | ICD-10-CM | POA: Diagnosis not present

## 2022-08-20 DIAGNOSIS — G629 Polyneuropathy, unspecified: Secondary | ICD-10-CM | POA: Diagnosis not present

## 2022-08-20 DIAGNOSIS — Z853 Personal history of malignant neoplasm of breast: Secondary | ICD-10-CM | POA: Diagnosis not present

## 2022-08-20 DIAGNOSIS — C50912 Malignant neoplasm of unspecified site of left female breast: Secondary | ICD-10-CM | POA: Diagnosis not present

## 2022-08-20 DIAGNOSIS — Z170 Estrogen receptor positive status [ER+]: Secondary | ICD-10-CM | POA: Diagnosis not present

## 2022-08-20 DIAGNOSIS — D6489 Other specified anemias: Secondary | ICD-10-CM | POA: Diagnosis not present

## 2022-08-20 DIAGNOSIS — Z5111 Encounter for antineoplastic chemotherapy: Secondary | ICD-10-CM | POA: Diagnosis not present

## 2022-08-20 DIAGNOSIS — Z95828 Presence of other vascular implants and grafts: Secondary | ICD-10-CM | POA: Diagnosis not present

## 2022-08-20 DIAGNOSIS — M8580 Other specified disorders of bone density and structure, unspecified site: Secondary | ICD-10-CM | POA: Diagnosis not present

## 2022-09-10 NOTE — Progress Notes (Signed)
Date:  09/24/2022   ID:  [name], DOB 1951-05-06, MRN [id number]  PCP:  Adrian Prince, MD  Cardiologist:   Eden Emms Electrophysiologist:  None   Evaluation Performed:  Follow-Up Visit  Chief Complaint:  CAD/PVD   History of Present Illness:    72 y.o. . anterior wall MI 1998 Rx with BMS to LAD . Edge restenosis Rx re intervention 2006-10-22 Quit smoking since then. Echo done 08/16/13 with EF 55-60% mild MR Has had anemia with polypectomy by Dr Marina Goodell 22-Oct-2015 and again September 2018. BP tends to run higher in office than at home     Having neuropathic symptoms in legs and especially arms RUE worse  Started with "pinched" nerve Seen by Dr Terrace Arabia neurology labs ok only Vit D low Symptoms not helped by Vitamin B shots  MRI no cervical root impingement    Seen by Dr Allyson Sabal for PVD. Has left subclavian stenosis and right CIA stenosis ABI"s in normal range 03/05/21 and carotids with plaque no stenosis [place] did show > 50% Left CIA stenosis   Unfortunaetly diagnosed with breast cancer. Paraneoplastic symptoms would seem to have been cause of her neuropathy Has had port a cath placed and being Rx at [facility]   She had TTE at [facility] I reviewed EF 50-55% strain -17.7 She has lost a lot of weight   Had porximal right humerus fracture 08/27/21 May 21, 2023hospitalized with azotemia and Cr 4 and dehydration   Husband of 52 years died in 05/21/23had been sick for a while with renal cancer She has 2 cats and her lab Chizzy past away last year   Weight stable and has port a cath for osteoporosis infusion still    Past Medical History:  Diagnosis Date   ABDOMINAL AORTIC ANEURYSM    Anemia    Arthritis    HANDS   Blood transfusion without reported diagnosis    AUGUST 5TH 2017 x2    Breast cancer    CAD    CONGESTIVE HEART FAILURE, SYSTOLIC, CHRONIC    CT, CHEST, ABNORMAL    GERD (gastroesophageal reflux disease)    Heart attack    x 2   History of colon polyps 01/05/2014   adenomatous polyps    HYPERLIPIDEMIA    HYPERTENSION    ISCHEMIC CARDIOMYOPATHY    Lower GI bleed 01/2014   NONSPECIFIC ABN FINDNG RAD&OTH EXAM BILARY TRCT    Numbness and tingling    Old anterior myocardial infarction 10/21/96 & 10-22-06   Paresthesia of both hands    TOBACCO ABUSE    Past Surgical History:  Procedure Laterality Date   bare metal stent placement     left anterior descending artery x 2   COLONOSCOPY N/A 01/05/2014   Procedure: COLONOSCOPY;  Surgeon: Beverley Fiedler, MD;  Location: [facility];  Service: Endoscopy;  Laterality: N/A;   COLONOSCOPY N/A 01/06/2014   Procedure: COLONOSCOPY;  Surgeon: Beverley Fiedler, MD;  Location: [facility];  Service: Endoscopy;  Laterality: N/A;   COLONOSCOPY     POLYPECTOMY       Current Meds  Medication Sig   acetaminophen (TYLENOL) 325 MG tablet Take 2 tablets (650 mg total) by mouth every 6 (six) hours as needed for mild pain (or Fever >/= 101).   clopidogrel (PLAVIX) 75 MG tablet TAKE 1 TABLET BY MOUTH EVERY DAY   cyanocobalamin (VITAMIN B12) 1000 MCG/ML injection Inject 1,000 mcg into the muscle every 30 (thirty) days.   docusate  sodium (COLACE) 100 MG capsule Take 100 mg by mouth daily as needed for mild constipation.   DULoxetine (CYMBALTA) 30 MG capsule Take 30 mg by mouth daily.   ezetimibe (ZETIA) 10 MG tablet Take 10 mg by mouth daily.   gabapentin (NEURONTIN) 300 MG capsule Take 1 capsule (300 mg total) by mouth 2 (two) times daily. (Patient taking differently: Take 300 mg by mouth 4 (four) times daily.)   iron polysaccharides (NIFEREX) 150 MG capsule Take 150 mg by mouth daily.   letrozole (FEMARA) 2.5 MG tablet Take 2.5 mg by mouth daily.   loperamide (IMODIUM) 2 MG capsule Take 2 mg by mouth daily as needed for diarrhea or loose stools.   losartan (COZAAR) 25 MG tablet Take 25 mg by mouth daily.   Neratinib Maleate (NERLYNX) 40 MG tablet Take 160 mg by mouth daily. 4 tablets = 160 mg   simvastatin (ZOCOR) 40 MG tablet TAKE 1 TABLET(40 MG) BY MOUTH DAILY  AT 6 PM   Vitamin D, Cholecalciferol, 25 MCG (1000 UT) CAPS Take 2,000 Units by mouth daily.     Allergies:   Patient has no known allergies.   Social History   Tobacco Use   Smoking status: Former    Types: Cigarettes, E-cigarettes    Quit date: 2013    Years since quitting: 11.3    Passive exposure: Never   Smokeless tobacco: Never  Vaping Use   Vaping Use: Some days  Substance Use Topics   Alcohol use: Not Currently   Drug use: No     Family Hx: The patient's family history includes Anemia in her sister; Colon polyps in her father and sister; Depression (age of onset: 57) in her father; Diabetes in her maternal grandmother; Heart attack in her father; Lung cancer in her sister; Lymphoma (age of onset: 21) in her mother. There is no history of Colon cancer, Esophageal cancer, Rectal cancer, or Stomach cancer.  ROS:   Please see the history of present illness.     All other systems reviewed and are negative.   Prior CV studies:   The following studies were reviewed today:  Carotid 03/02/20  AAA [place] 03/02/20 Left CIA velocity 4.6 m/sec   Labs/Other Tests and Data Reviewed:    EKG:  NSR rate 68 normal ECG 08/11/17, 09/24/2022 NSR rate 63 normal   Recent Labs: 04/10/2022: B Natriuretic Peptide 5.7; TSH 1.031 04/11/2022: ALT 14; Hemoglobin 9.2; Magnesium 1.8; Platelets 130 04/12/2022: BUN 14; Creatinine, Ser 0.67; Potassium 4.8; Sodium 128   Recent Lipid Panel Lab Results  Component Value Date/Time   CHOL 161 07/28/2013 12:18 PM   TRIG 78.0 07/28/2013 12:18 PM   HDL 65.30 07/28/2013 12:18 PM   CHOLHDL 2 07/28/2013 12:18 PM   LDLCALC 80 07/28/2013 12:18 PM    Wt Readings from Last 3 Encounters:  09/24/22 97 lb 9.6 oz (44.3 kg)  04/12/22 93 lb 4.1 oz (42.3 kg)  03/18/22 97 lb (44 kg)     Objective:    Vital Signs:  BP (!) 180/78 (BP Location: Left Arm, Patient Position: Sitting, Cuff Size: Normal)   Pulse 72   Ht 5\' 4"  (1.626 m)   Wt 97 lb 9.6 oz (44.3 kg)    SpO2 99%   BMI 16.75 kg/m    Affect appropriate Chronically ill white female  HEENT: normal Neck supple with no adenopathy JVP normal bilateral bruits no thyromegaly Lungs clear with no wheezing and good diaphragmatic motion Heart:  S1/S2 no murmur,  no rub, gallop or click PMI normal port a cath over right chest  Abdomen: benighn, BS positve, no tenderness, no AAA no bruit.  No HSM or HJR Distal pulses intact with no bruits No edema Neuro non-focal sensory deficits arms/legs  Skin warm and dry No muscular weakness   ASSESSMENT & PLAN:    CAD: Stable with no angina and good activity level.  Continue medical Rx  LAD stent in 58 with re intervention Oct 26, 2006 low dose beta blocker due to bradycardia    HTN: Well controlled.  Continue current medications and low sodium Dash type diet.  Home readings normal white coat component    HLD:  On statin labs with primary   Anemia:  On iron improved no bleeding  02/19/16 EGD normal colonoscopy with removal of 8 polyps Dr Marina Goodell did repeat Colonoscopy 02/13/18 and removed 2 more polyps    PVD: f/u Dr Allyson Sabal moderate LLE dx and left subclavian stenosis without arm weakness Duplex 910/3/22 no ICA Stenosis Left subclavian stenosis but antegrade vertebral flow  Peak velocity 3.6 m/sec Left carotid bruit with plaque no stenosis on duplex 03/2021 Will order f/u US carotids and LE with ABI's    Depression: Sister died Oct 25, 2016  husband with DCM and renal cell cancer PRN valium    Neuropathy:  F/u neurology lab work and MRI's unrevealing to date 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  The importance of social distancing was discussed today.   Medication Adjustments/Labs and Tests Ordered: Current medicines are reviewed at length with the patient today.  Concerns regarding medicines are outlined above.   Tests Ordered:  LE arterial duplex with ABI's Carotid duplex   Medication Changes:  None   Disposition:  Follow up with Dr Allyson Sabal [facility] and me in a year   Signed, Charlton Haws, MD  09/24/2022 9:51 AM    [facility]

## 2022-09-19 DIAGNOSIS — C801 Malignant (primary) neoplasm, unspecified: Secondary | ICD-10-CM | POA: Diagnosis not present

## 2022-09-19 DIAGNOSIS — G63 Polyneuropathy in diseases classified elsewhere: Secondary | ICD-10-CM | POA: Diagnosis not present

## 2022-09-24 ENCOUNTER — Encounter: Payer: Self-pay | Admitting: Cardiovascular Disease

## 2022-09-24 ENCOUNTER — Ambulatory Visit: Payer: PPO | Attending: Cardiovascular Disease | Admitting: Cardiovascular Disease

## 2022-09-24 VITALS — BP 180/78 | HR 72 | Ht 64.0 in | Wt 97.6 lb

## 2022-09-24 DIAGNOSIS — I6523 Occlusion and stenosis of bilateral carotid arteries: Secondary | ICD-10-CM

## 2022-09-24 DIAGNOSIS — I2510 Atherosclerotic heart disease of native coronary artery without angina pectoris: Secondary | ICD-10-CM

## 2022-09-24 DIAGNOSIS — I739 Peripheral vascular disease, unspecified: Secondary | ICD-10-CM

## 2022-09-24 MED ORDER — NITROGLYCERIN 0.4 MG SL SUBL: 0.4000 mg | SUBLINGUAL_TABLET | SUBLINGUAL | 3 refills | Status: AC | PRN

## 2022-09-24 NOTE — Patient Instructions (Addendum)
Medication Instructions:  Your physician recommends that you continue on your current medications as directed. Please refer to the Current Medication list given to you today.  *If you need a refill on your cardiac medications before your next appointment, please call your pharmacy*  Lab Work: If you have labs (blood work) drawn today and your tests are completely normal, you will receive your results only by: MyChart Message (if you have MyChart) OR A paper copy in the mail If you have any lab test that is abnormal or we need to change your treatment, we will call you to review the results.   Testing/Procedures: Your physician has requested that you have a carotid duplex. This test is an ultrasound of the carotid arteries in your neck. It looks at blood flow through these arteries that supply the brain with blood. Allow one hour for this exam. There are no restrictions or special instructions.  Your physician has requested that you have a lower extremity arterial duplex with ABI's. This test is an ultrasound of the arteries in the legs. It looks at arterial blood flow in the legs and arms. Allow one hour for Lower Arterial scans. There are no restrictions or special instructions   Follow-Up: At [facility], you and your health needs are our priority.  As part of our continuing mission to provide you with exceptional heart care, we have created designated Provider Care Teams.  These Care Teams include your primary Cardiologist (physician) and Advanced Practice Providers (APPs -  Physician Assistants and Nurse Practitioners) who all work together to provide you with the care you need, when you need it.  We recommend signing up for the patient portal called "MyChart".  Sign up information is provided on this After Visit Summary.  MyChart is used to connect with patients for Virtual Visits (Telemedicine).  Patients are able to view lab/test results, encounter notes, upcoming appointments,  etc.  Non-urgent messages can be sent to your provider as well.   To learn more about what you can do with MyChart, go to [email or web address].    Your next appointment:   1 year(s)  Provider:   Charlton Haws, MD     Your physician recommends that you schedule a follow-up appointment after test with Dr. Allyson Sabal.

## 2022-09-24 NOTE — Addendum Note (Signed)
Addended by: Virl Axe, Zella Dewan L on: 09/24/2022 10:03 AM   Modules accepted: Orders

## 2022-09-30 ENCOUNTER — Ambulatory Visit: Payer: PPO | Admitting: Orthopedic Surgery

## 2022-10-07 ENCOUNTER — Ambulatory Visit (HOSPITAL_COMMUNITY)
Admission: RE | Admit: 2022-10-07 | Discharge: 2022-10-07 | Disposition: A | Discharge status: Home / Self Care | Payer: PPO | Source: Ambulatory Visit | Attending: Cardiovascular Disease | Admitting: Cardiovascular Disease

## 2022-10-07 ENCOUNTER — Ambulatory Visit (HOSPITAL_BASED_OUTPATIENT_CLINIC_OR_DEPARTMENT_OTHER)
Admission: RE | Admit: 2022-10-07 | Discharge: 2022-10-07 | Disposition: A | Discharge status: Home / Self Care | Payer: PPO | Source: Ambulatory Visit | Attending: Cardiovascular Disease | Admitting: Cardiovascular Disease

## 2022-10-07 DIAGNOSIS — I6523 Occlusion and stenosis of bilateral carotid arteries: Secondary | ICD-10-CM | POA: Diagnosis not present

## 2022-10-07 DIAGNOSIS — I739 Peripheral vascular disease, unspecified: Secondary | ICD-10-CM

## 2022-10-07 LAB — VAS US ABI WITH/WO TBI
Left ABI: 1.04
Right ABI: 1.02

## 2022-10-16 ENCOUNTER — Other Ambulatory Visit (INDEPENDENT_AMBULATORY_CARE_PROVIDER_SITE_OTHER): Payer: PPO

## 2022-10-16 ENCOUNTER — Ambulatory Visit: Payer: PPO | Admitting: Orthopedic Surgery

## 2022-10-16 DIAGNOSIS — M25512 Pain in left shoulder: Secondary | ICD-10-CM

## 2022-10-16 NOTE — Progress Notes (Signed)
Orthopedic Surgery Progress Note     Assessment: Patient is a 72 y.o. female with left proximal humerus fracture being treated non-operatively with healing as expected (approximately 6 months from injury) Date of injury: 04/10/2022     Plan: -No acute operative intervention -Weight-bear as tolerated left upper extremity -Range of motion as tolerated -OTC medications for pain relief -Follow-up on an as needed basis   ___________________________________________________________________________   Subjective: Patient is a 72 year old female who had a ground-level fall and sustained a left proximal humerus fracture.  Has been doing well since time I saw her.  She is not having any consistent pain in the shoulder.  She says every once in awhile she will have pain it passes quickly. Is not taking anything for pain. She is able to use her shoulder without any pain.  She can reach overhead without pain. Denies paresthesias and numbness.      Physical Exam:   General: no acute distress, appears stated age Neurologic: alert, answering questions appropriately, following commands Respiratory: unlabored breathing on room air, symmetric chest rise Psychiatric: appropriate affect, normal cadence to speech   MSK:    -Left upper extremity             Nontender to palpation over the shoulder and the remainder of the extremity, no gross deformity  No pain with shuck of the arm             Was able to forward flex and abduct her arm to 100 degrees without pain, can externally rotate 90 degrees, internal rotation to bra strap Fires deltoid, biceps, triceps, wrist extensors, wrist flexors, finger extensors, finger flexors             AIN/PIN/IO intact             Sensation intact to light touch in median/ulnar/radial/axillary nerve distributions             Hand warm and well perfused, palpable radial pulse     Imaging: XR of the left shoulder taken 10/16/2022 was independently reviewed and interpreted,  showing proximal humerus fracture with varus alignment. No glenohumeral dislocation. Callus formation around the fracture seen. No distinct fracture lines seen anymore. No new fractures seen.   Patient name: [name] Patient MRN: [id number] Date: 10/16/22

## 2022-10-18 DIAGNOSIS — I2510 Atherosclerotic heart disease of native coronary artery without angina pectoris: Secondary | ICD-10-CM | POA: Diagnosis not present

## 2022-10-18 DIAGNOSIS — J432 Centrilobular emphysema: Secondary | ICD-10-CM | POA: Diagnosis not present

## 2022-10-18 DIAGNOSIS — I110 Hypertensive heart disease with heart failure: Secondary | ICD-10-CM | POA: Diagnosis not present

## 2022-10-18 DIAGNOSIS — I6523 Occlusion and stenosis of bilateral carotid arteries: Secondary | ICD-10-CM | POA: Diagnosis not present

## 2022-10-18 DIAGNOSIS — D509 Iron deficiency anemia, unspecified: Secondary | ICD-10-CM | POA: Diagnosis not present

## 2022-10-18 DIAGNOSIS — Z853 Personal history of malignant neoplasm of breast: Secondary | ICD-10-CM | POA: Diagnosis not present

## 2022-10-18 DIAGNOSIS — E785 Hyperlipidemia, unspecified: Secondary | ICD-10-CM | POA: Diagnosis not present

## 2022-10-18 DIAGNOSIS — K7689 Other specified diseases of liver: Secondary | ICD-10-CM | POA: Diagnosis not present

## 2022-10-18 DIAGNOSIS — I739 Peripheral vascular disease, unspecified: Secondary | ICD-10-CM | POA: Diagnosis not present

## 2022-10-18 DIAGNOSIS — I700 Atherosclerosis of aorta: Secondary | ICD-10-CM | POA: Diagnosis not present

## 2022-10-18 DIAGNOSIS — G609 Hereditary and idiopathic neuropathy, unspecified: Secondary | ICD-10-CM | POA: Diagnosis not present

## 2022-10-18 DIAGNOSIS — I714 Abdominal aortic aneurysm, without rupture, unspecified: Secondary | ICD-10-CM | POA: Diagnosis not present

## 2022-11-05 ENCOUNTER — Ambulatory Visit: Payer: PPO | Admitting: Cardiovascular Disease

## 2022-11-05 IMAGING — CT CT HEAD W/O CM
3 series · 15 of 47 positions shown, 18 images · non-contrast
Comparison: None.

CLINICAL DATA: Head trauma



[Series 4: head 5.0 h30s · axial · 0.41mm/px · z∈[-78,+52]mm · 9 of 32 slices shown, 12 images]
[im 3/32  brain]
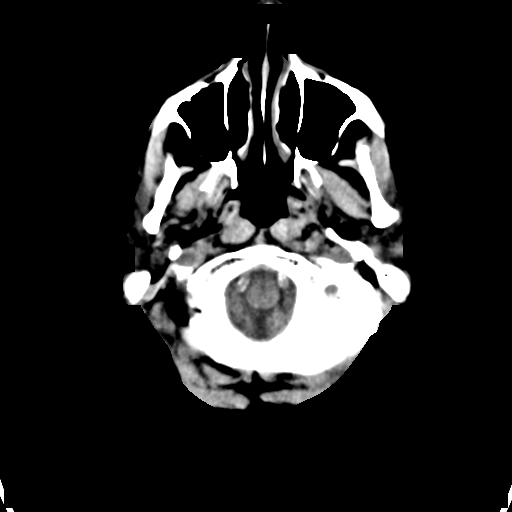
[im 3/32  bone]
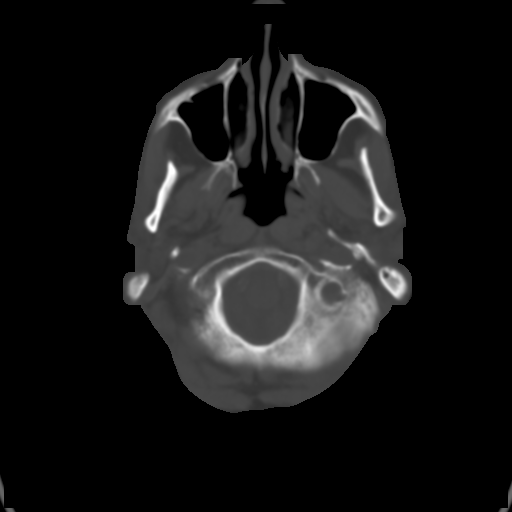
[im 6/32  brain]
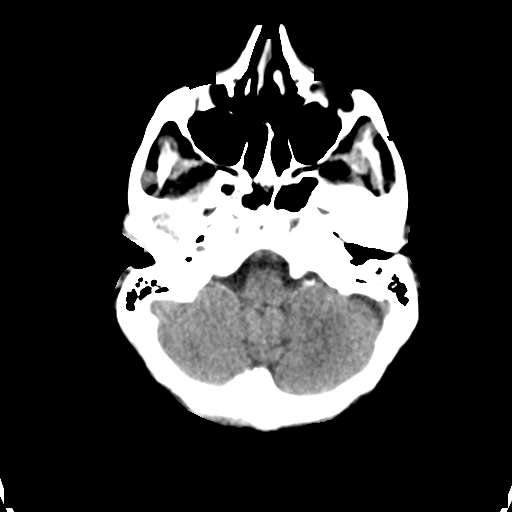
[im 9/32  brain]
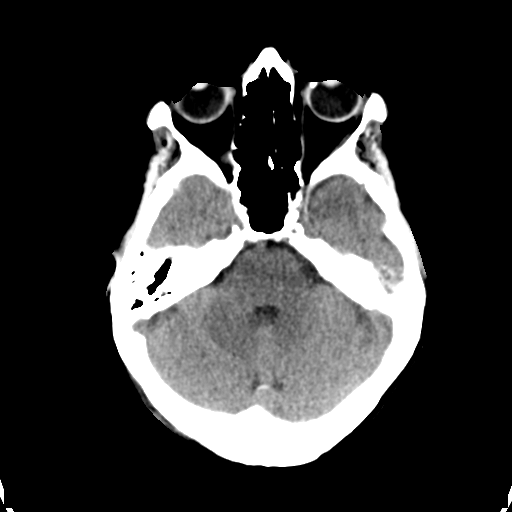
[im 12/32  brain]
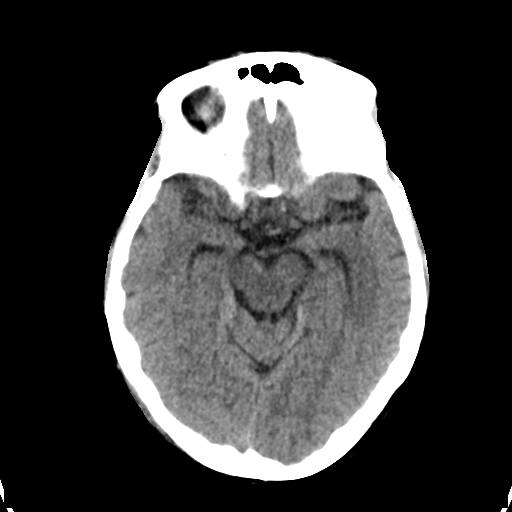
[im 17/32  brain]
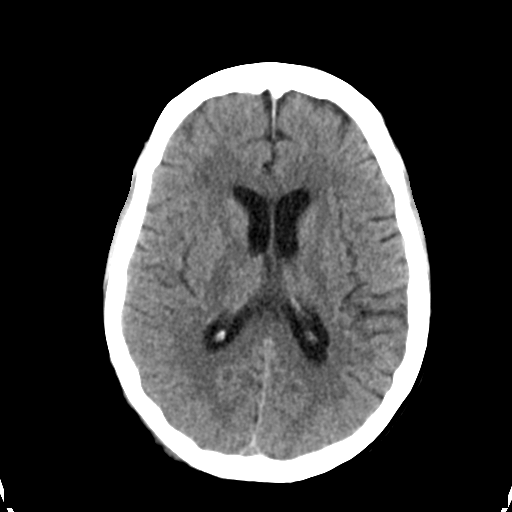
[im 17/32  bone]
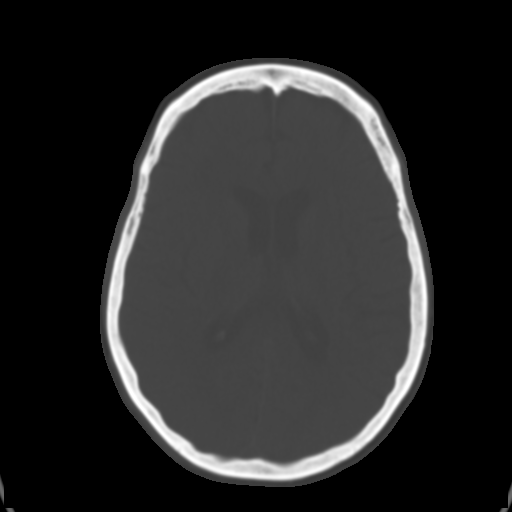
[im 20/32  brain]
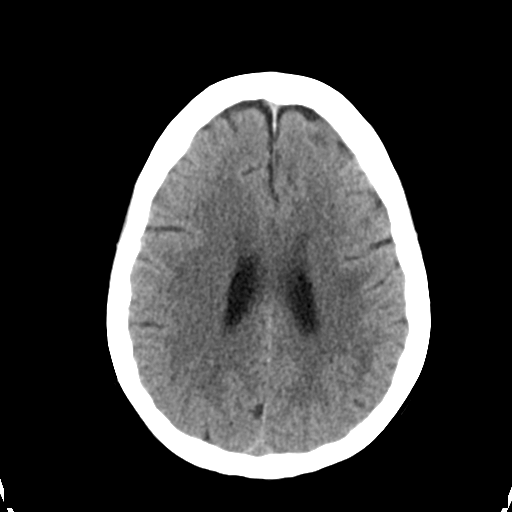
[im 23/32  brain]
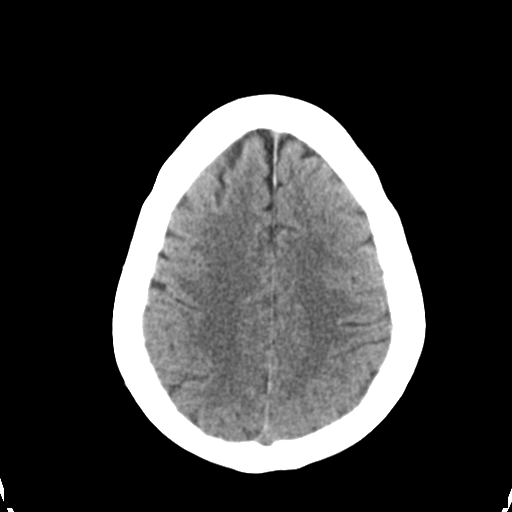
[im 26/32  brain]
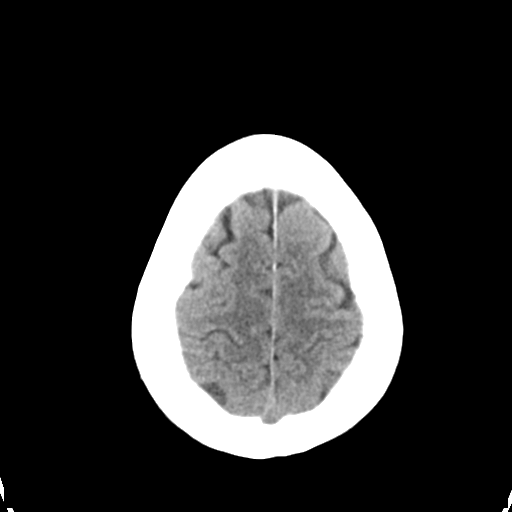
[im 29/32  brain]
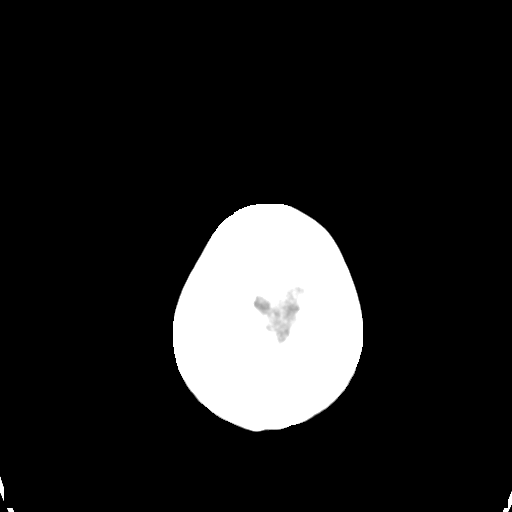
[im 29/32  bone]
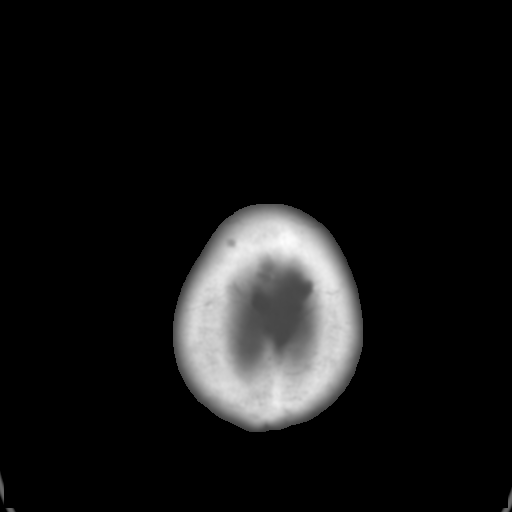

[Series 5: head 3.0 mpr cor · coronal · 0.31mm/px · 3 of 67 slices shown]
[im 23/67  brain]
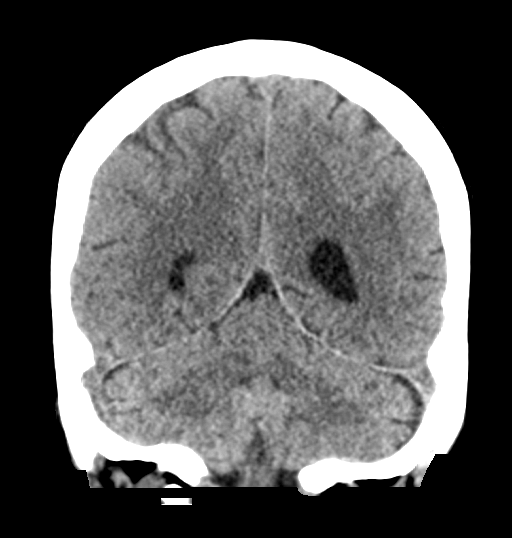
[im 30/67  brain]
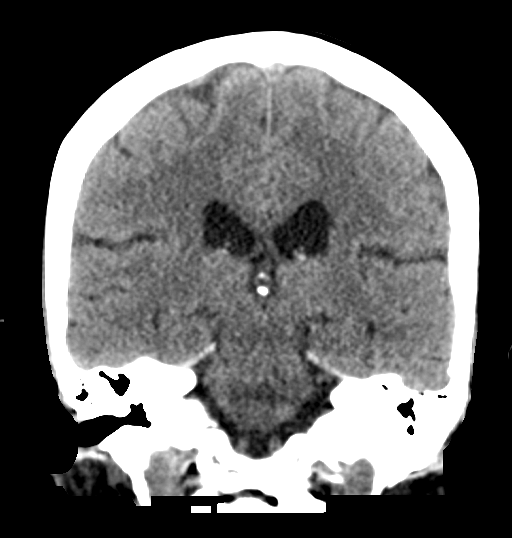
[im 37/67  brain]
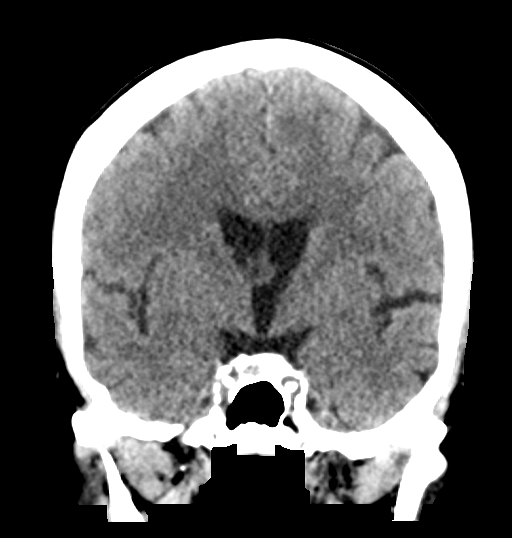

[Series 6: head 3.0 mpr sag · sagittal · 0.33mm/px · 3 of 53 slices shown]
[im 18/53  brain]
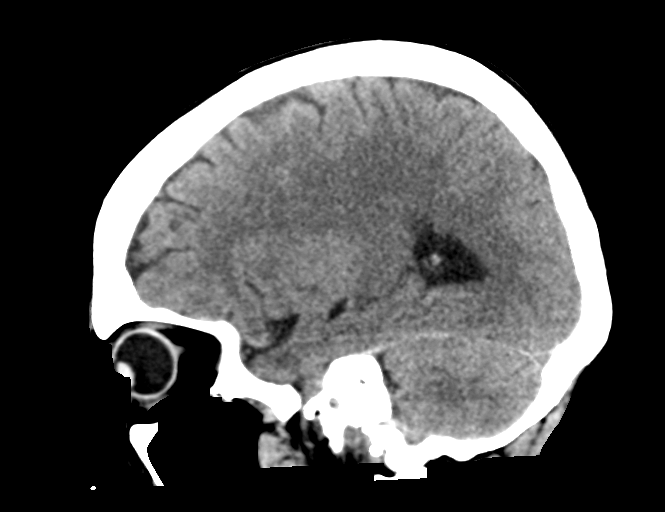
[im 27/53  brain]
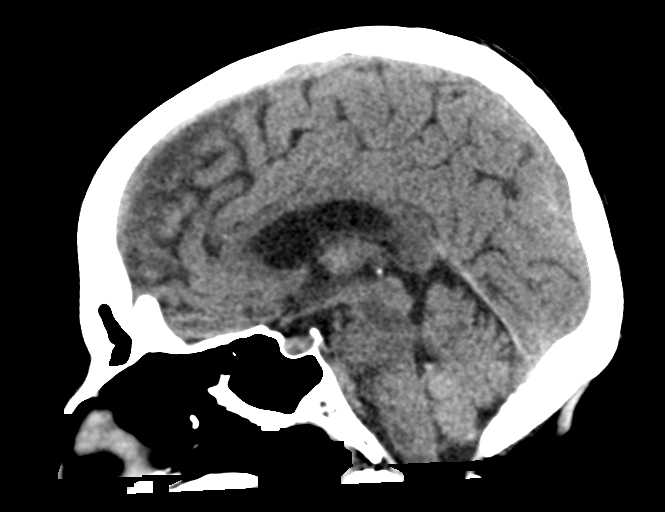
[im 35/53  brain]
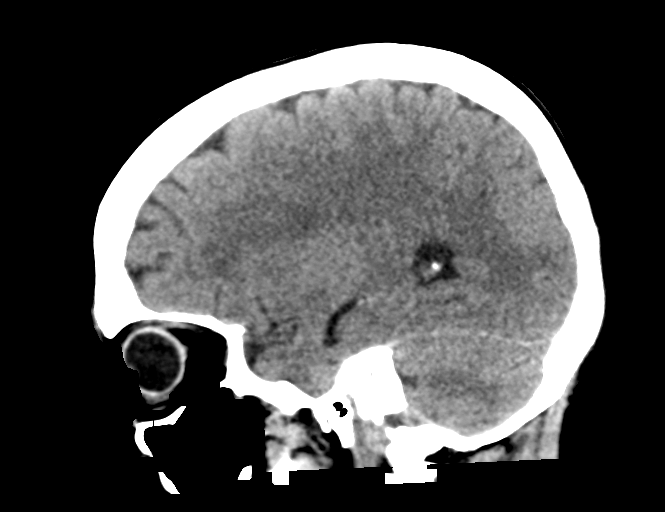

[15 of 47 positions shown; findings below may reference images not displayed]

FINDINGS: Brain: No acute intracranial hemorrhage, mass effect, or herniation.
No extra-axial fluid collections. No evidence of acute territorial
infarct. No hydrocephalus. Patchy hypodensities in the
periventricular and subcortical white matter, likely secondary to
chronic microvascular ischemic changes.

Vascular: Calcified plaques in the carotid siphons.

Skull: Normal. Negative for fracture or focal lesion.

Sinuses/Orbits: No acute finding.

Other: None.
IMPRESSION: Chronic changes with no acute intracranial process identified.

## 2022-11-05 IMAGING — DX DG SHOULDER 2+V*R*
3 series · 3 of 3 positions shown · non-contrast
Comparison: None.

CLINICAL DATA: Right shoulder pain following fall, initial
encounter

EXAM:
RIGHT SHOULDER - 2+ VIEW

[shoulder ap]
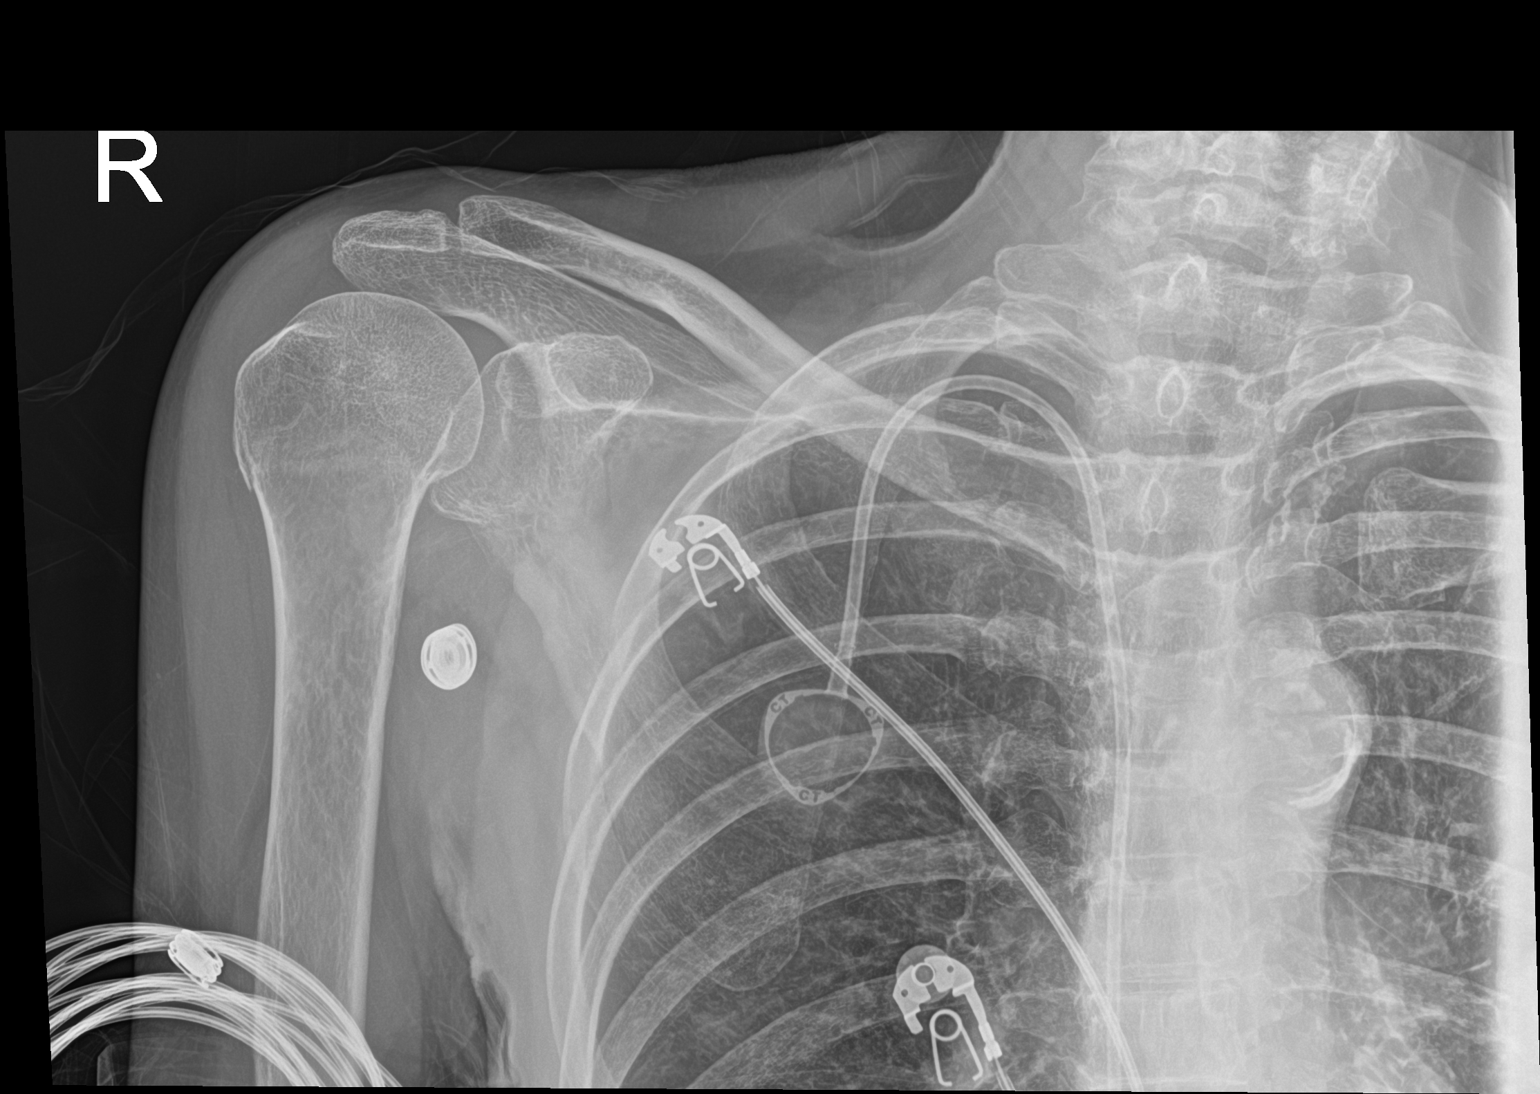

[shoulder obl (1 of 2)]
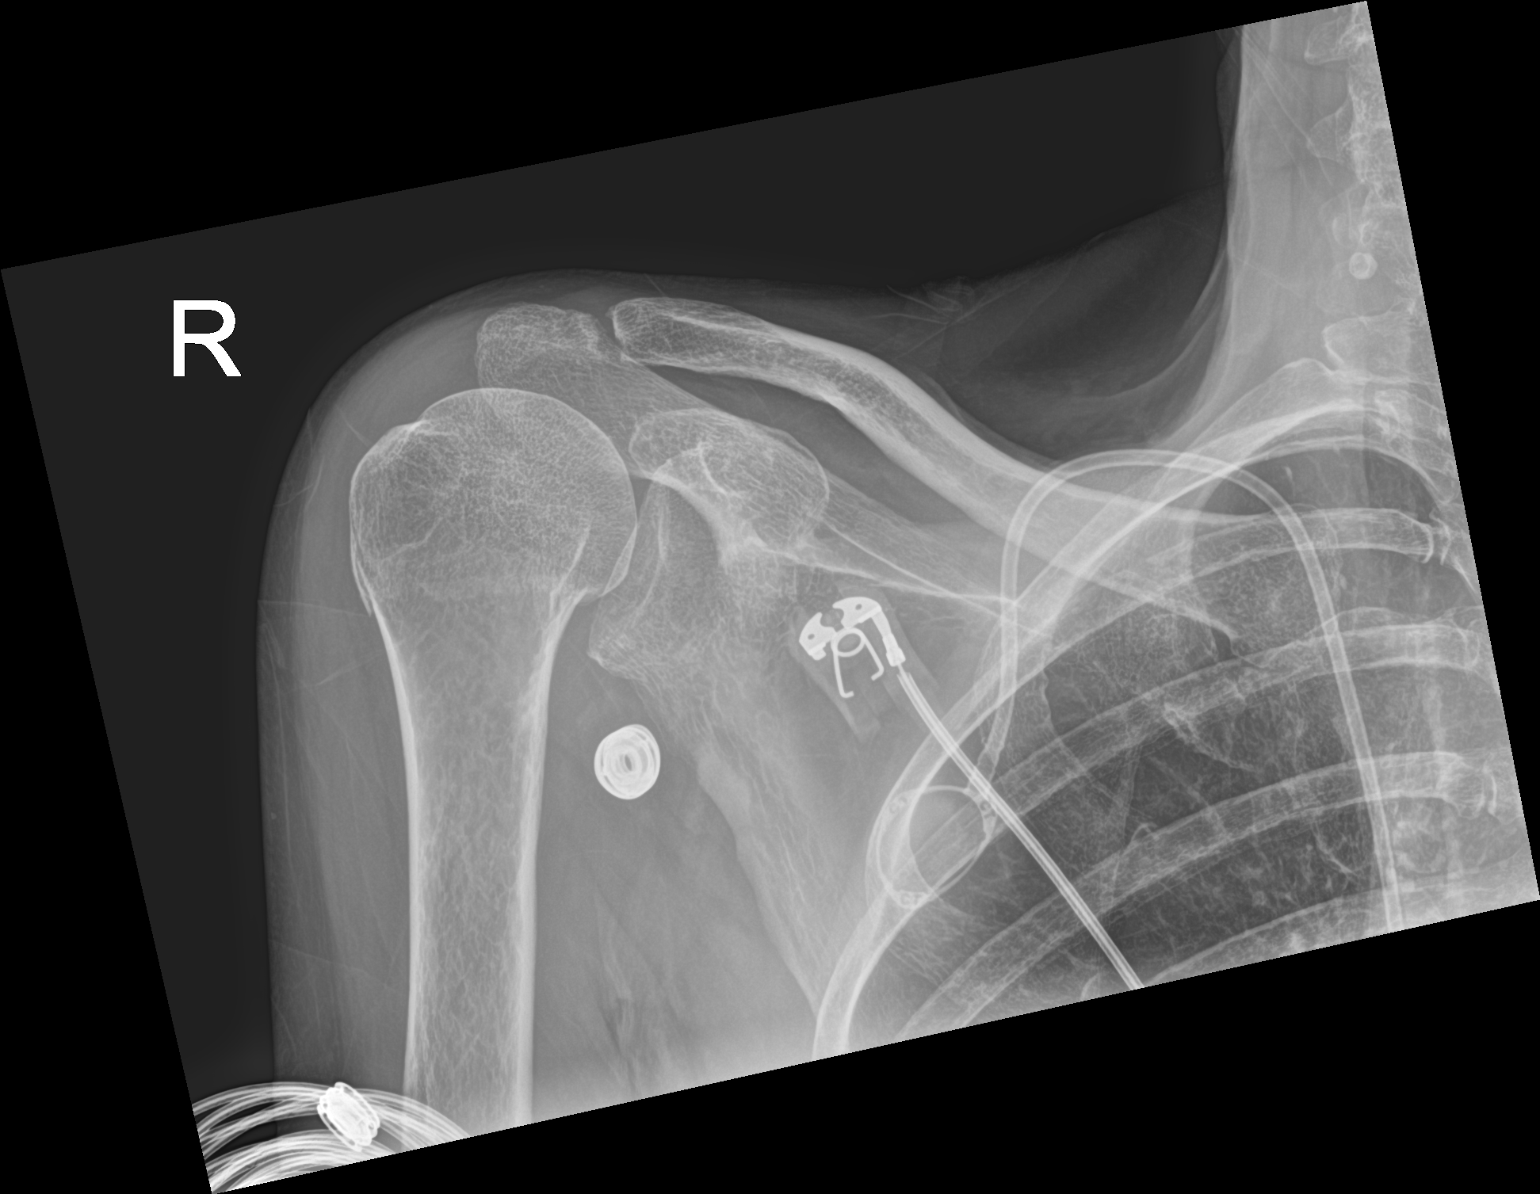

[shoulder obl (2 of 2)]
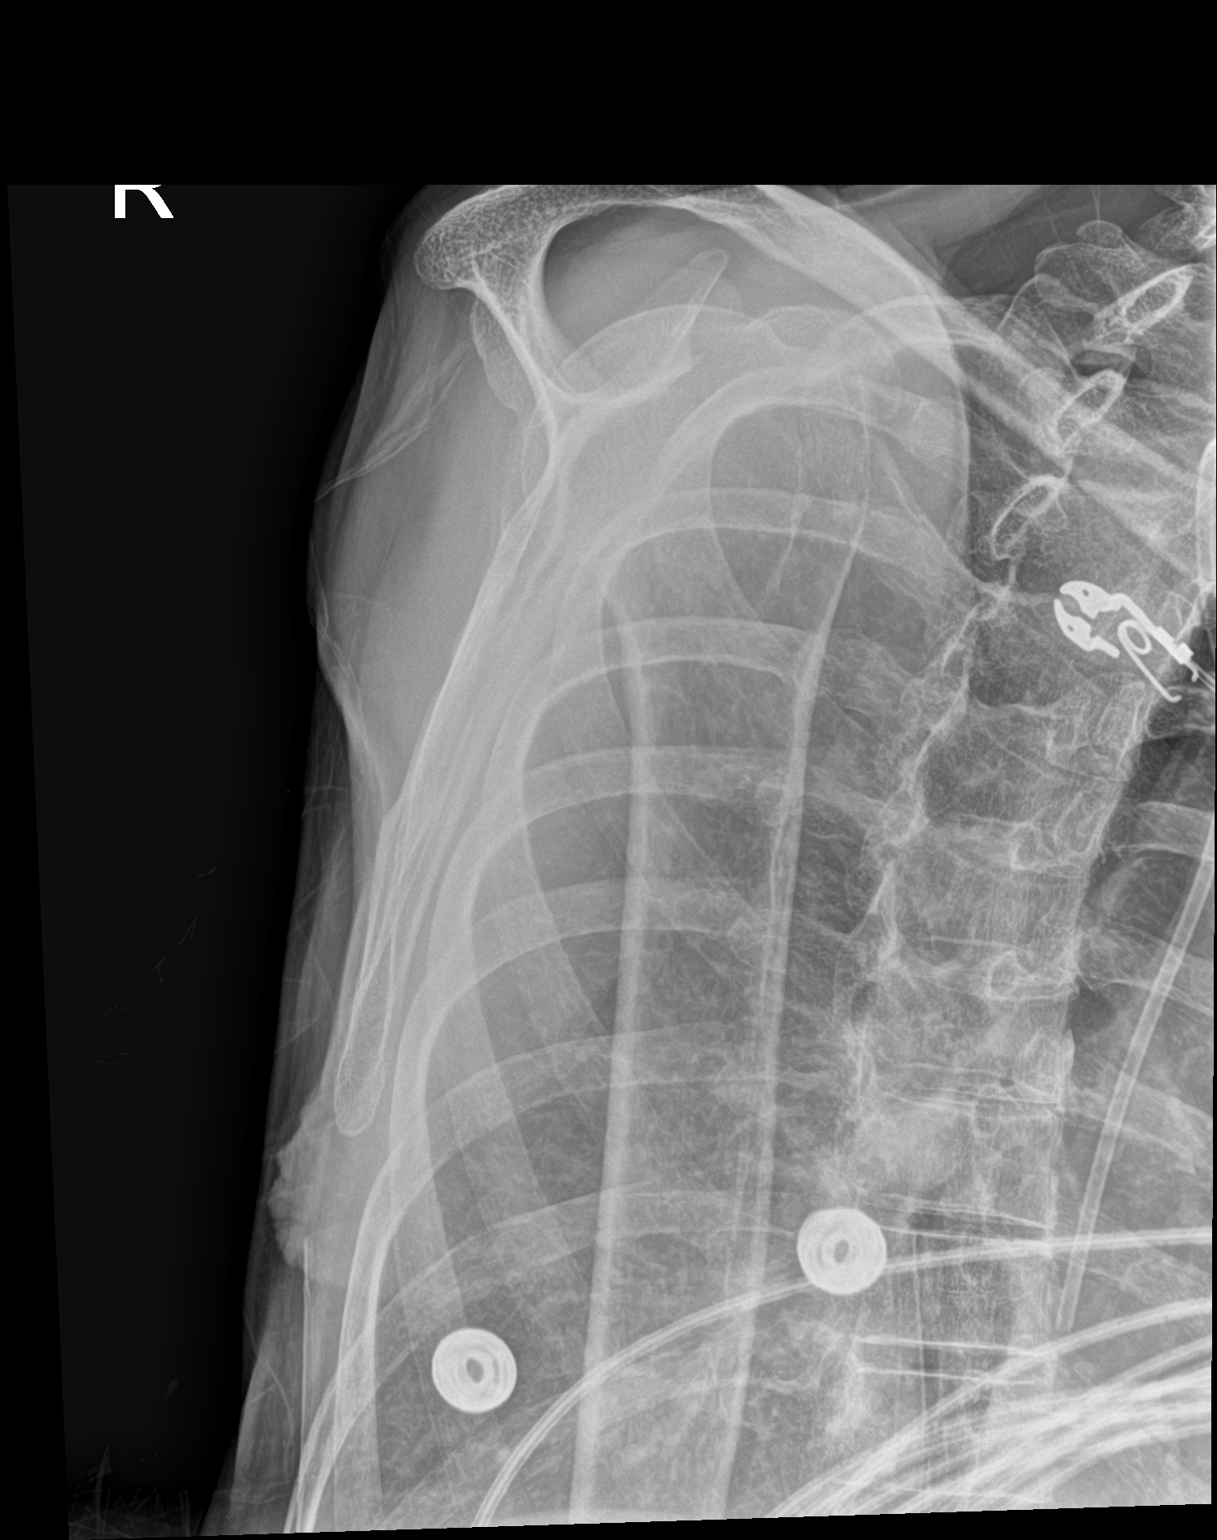

[3 of 3 positions shown; findings below may reference images not displayed]

FINDINGS: Minimally impacted proximal right humeral fracture is noted
involving the surgical neck primarily. No dislocation is seen. No
other focal abnormality is noted.
IMPRESSION: Proximal right humeral fracture as described without evidence of
dislocation.

## 2022-11-06 ENCOUNTER — Encounter: Payer: Self-pay | Admitting: Cardiovascular Disease

## 2022-11-06 ENCOUNTER — Ambulatory Visit: Payer: PPO | Attending: Cardiovascular Disease | Admitting: Cardiovascular Disease

## 2022-11-06 VITALS — BP 189/91 | HR 74 | Ht 63.0 in | Wt 100.2 lb

## 2022-11-06 DIAGNOSIS — I714 Abdominal aortic aneurysm, without rupture, unspecified: Secondary | ICD-10-CM | POA: Diagnosis not present

## 2022-11-06 DIAGNOSIS — I739 Peripheral vascular disease, unspecified: Secondary | ICD-10-CM

## 2022-11-06 DIAGNOSIS — I771 Stricture of artery: Secondary | ICD-10-CM

## 2022-11-06 IMAGING — CT CT ANGIO CHEST
2 of 7 series · 17 of 46 positions shown · IV contrast (APPLIED)
Comparison: None.

CLINICAL DATA: Syncope.

EXAM:
CT ANGIOGRAPHY CHEST WITH CONTRAST
TECHNIQUE: Multidetector CT imaging of the chest was performed using the
standard protocol during bolus administration of intravenous
contrast. Multiplanar CT image reconstructions and MIPs were
obtained to evaluate the vascular anatomy.

[Series 7: thins · axial · 0.64mm/px · z∈[-388,-100]mm · 14 of 463 slices shown]
[im 26/463  lung]
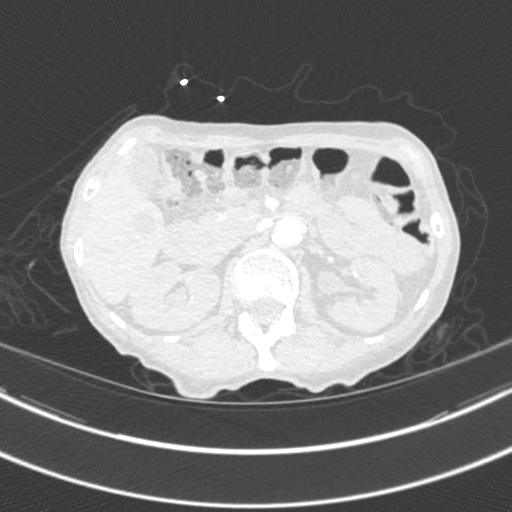
[im 52/463  soft-tissue]
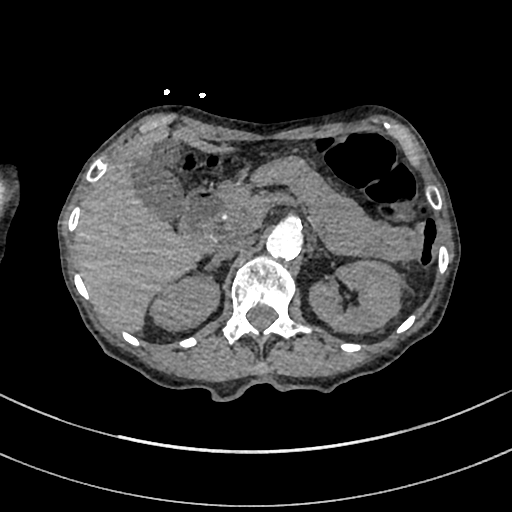
[im 103/463  lung]
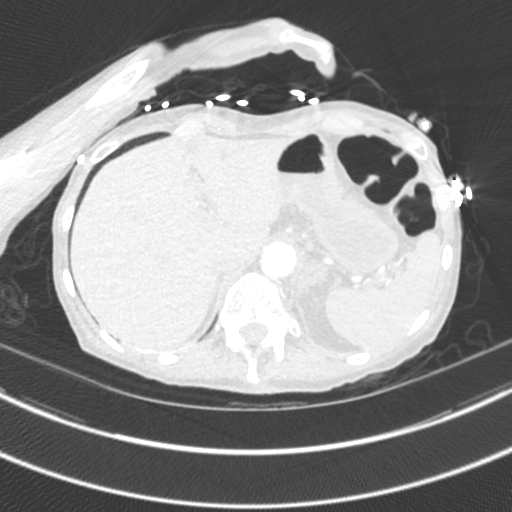
[im 129/463  soft-tissue]
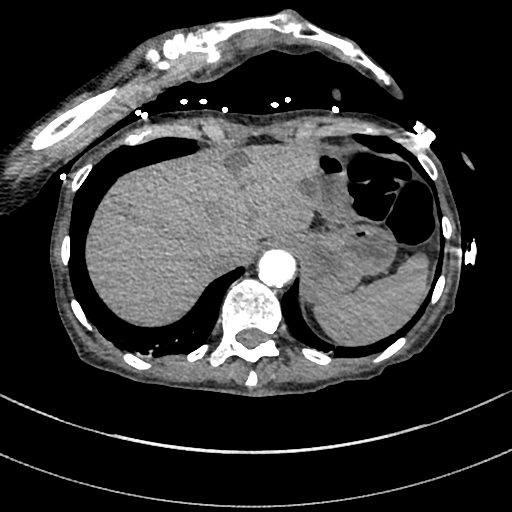
[im 155/463  lung]
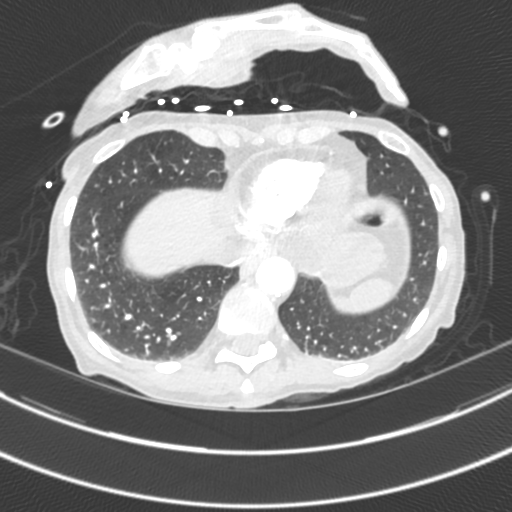
[im 180/463  soft-tissue]
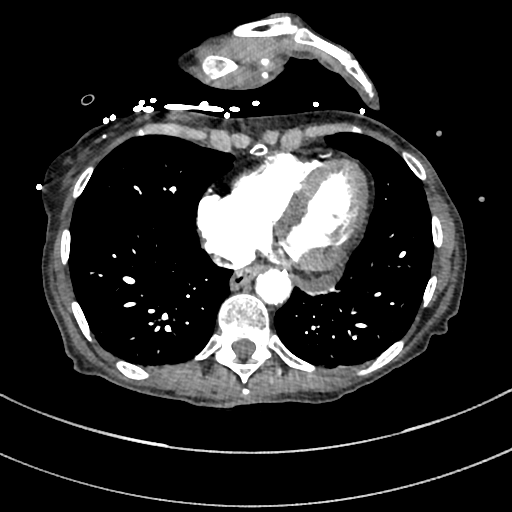
[im 206/463  lung]
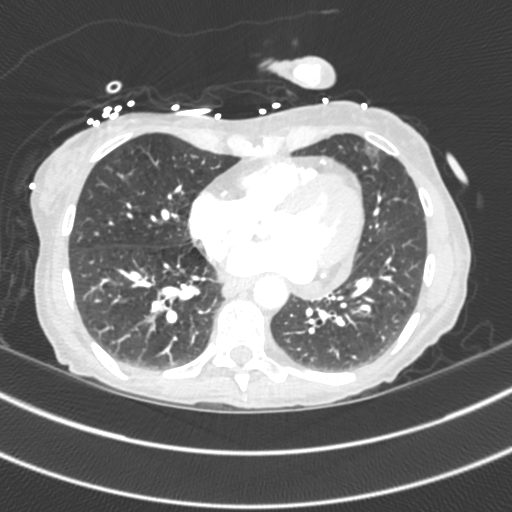
[im 257/463  soft-tissue]
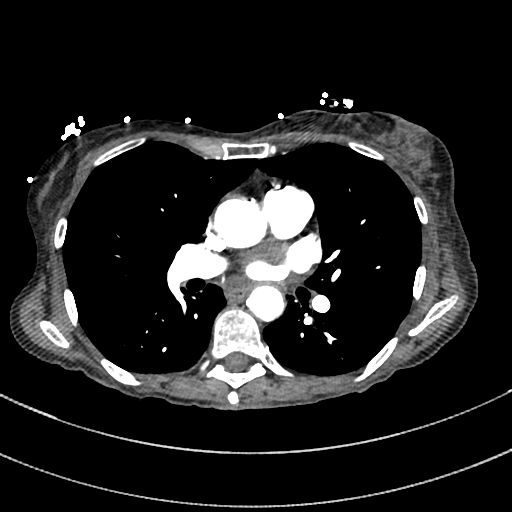
[im 283/463  lung]
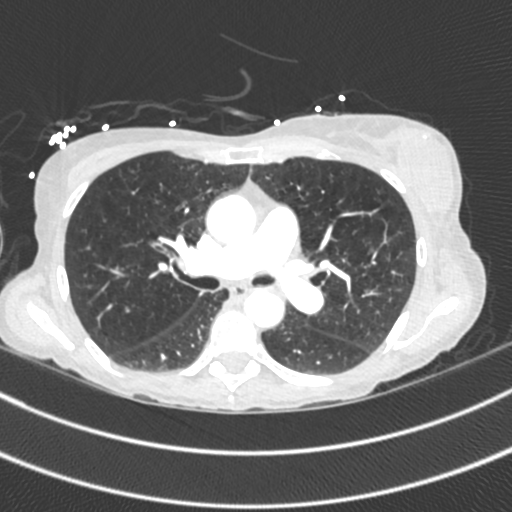
[im 309/463  soft-tissue]
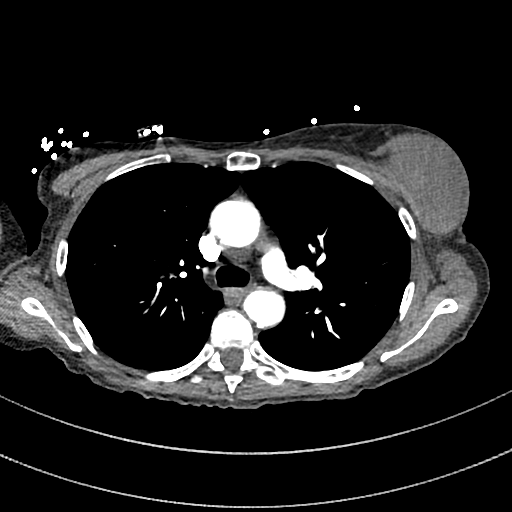
[im 334/463  lung]
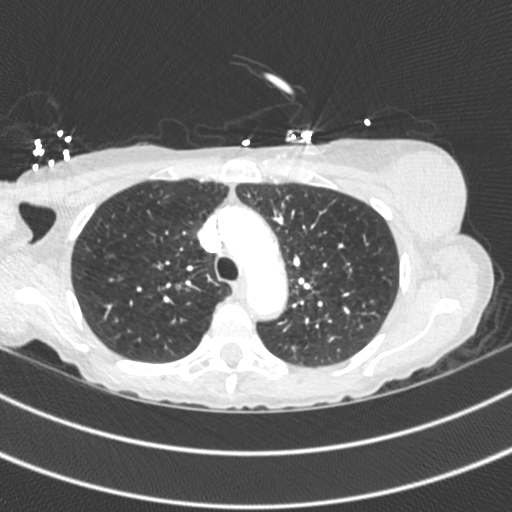
[im 360/463  soft-tissue]
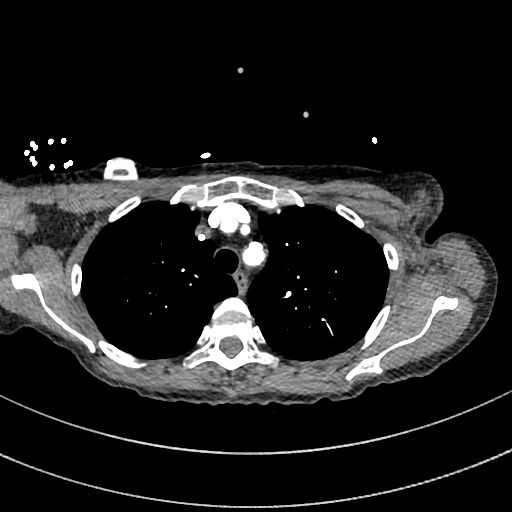
[im 411/463  lung]
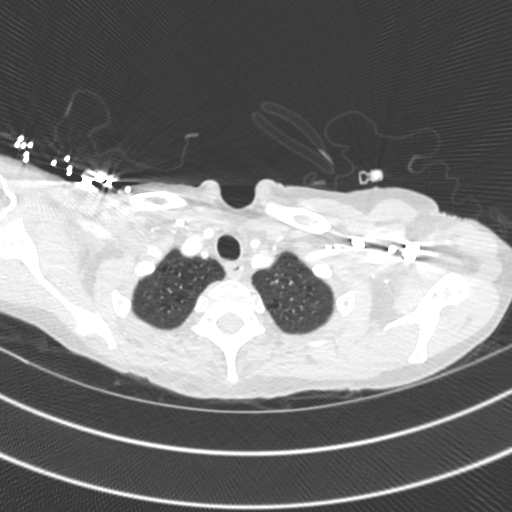
[im 437/463  soft-tissue]
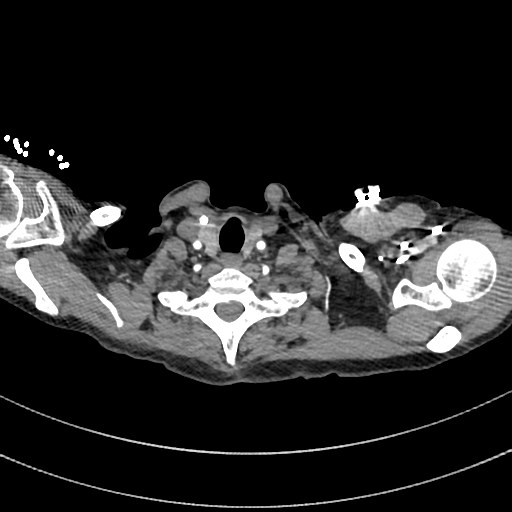

[Series 8: cor · coronal · 0.63mm/px · 3 of 128 slices shown]
[im 32/128  soft-tissue]
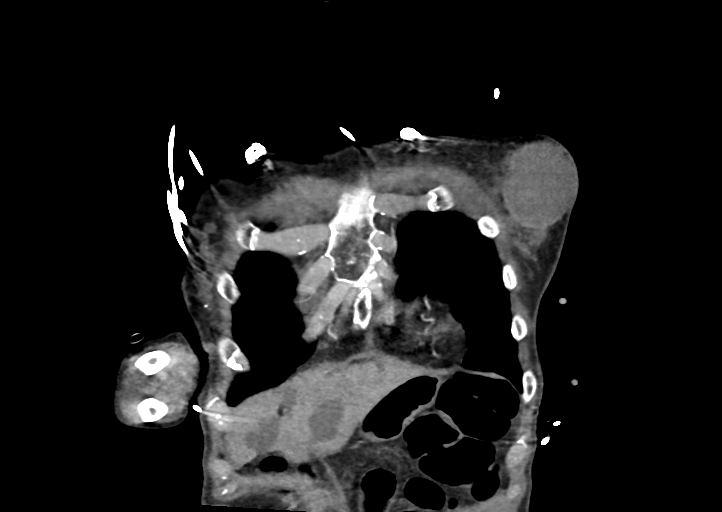
[im 64/128  soft-tissue]
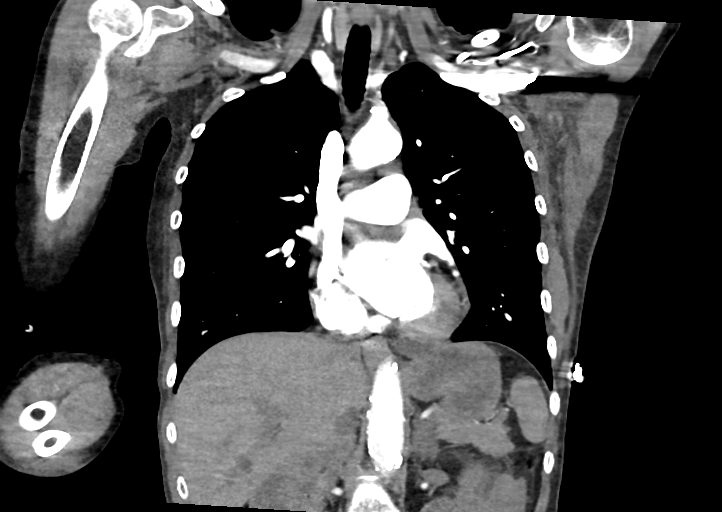
[im 96/128  soft-tissue]
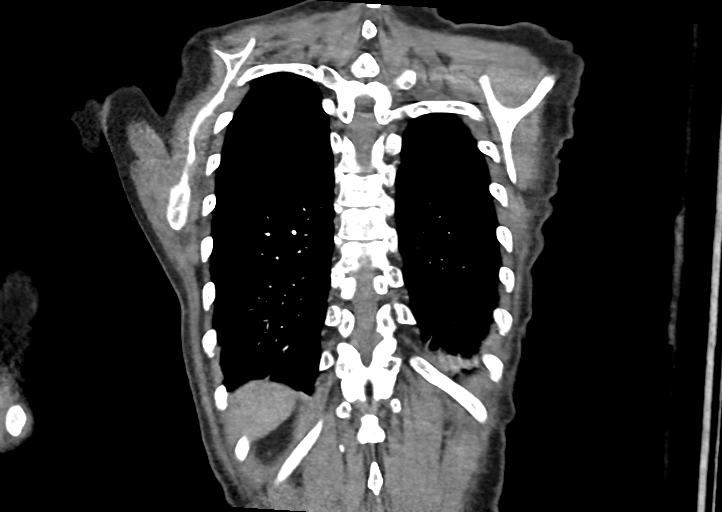

[17 of 46 positions shown; findings below may reference images not displayed]

RADIATION DOSE REDUCTION: This exam was performed according to the
departmental dose-optimization program which includes automated
exposure control, adjustment of the mA and/or kV according to
patient size and/or use of iterative reconstruction technique.

CONTRAST:  55mL OMNIPAQUE IOHEXOL 350 MG/ML SOLN
FINDINGS: Cardiovascular: A right-sided venous Port-A-Cath is in place. There
is moderate severity calcification of the thoracic aorta, without
evidence of aortic aneurysm or dissection. Satisfactory
opacification of the pulmonary arteries to the segmental level. No
evidence of pulmonary embolism. Normal heart size with marked
severity coronary artery calcification. No pericardial effusion.

Mediastinum/Nodes: No enlarged mediastinal, hilar, or axillary lymph
nodes. Thyroid gland, trachea, and esophagus demonstrate no
significant findings.

Lungs/Pleura: Moderate to marked severity emphysematous lung disease
is seen involving predominantly the bilateral upper lobes.

Mild posterior bibasilar atelectasis is noted.

There is no evidence of acute infiltrate, pleural effusion or
pneumothorax.

Upper Abdomen: Multiple simple cysts of various sizes are seen
scattered throughout the liver parenchyma.

Mild diffuse bilateral adrenal gland enlargement is seen.

Musculoskeletal: No chest wall abnormality. No acute or significant
osseous findings.

Review of the MIP images confirms the above findings.
IMPRESSION: 1. No evidence of pulmonary embolism or other acute cardiopulmonary
disease.
2. Moderate to marked severity emphysematous lung disease.
3. Multiple simple hepatic cysts.

Aortic Atherosclerosis (4UE7D-TNE.E) and Emphysema (4UE7D-O7A.W).

## 2022-11-06 NOTE — Assessment & Plan Note (Signed)
Asymptomatic left subclavian artery stenosis by duplex ultrasound performed 10/07/2022.  She has no upper extremity blood pressure differential and antegrade vertebral blood blood flow on the left side.  She denies upper extremity claudication.  Will continue to follow this noninvasively.

## 2022-11-06 NOTE — Assessment & Plan Note (Signed)
History of small abdominal aortic aneurysm measuring 3.5 cm by recent ultrasound performed 10/07/2022.  This will be repeated on an annual basis.

## 2022-11-06 NOTE — Assessment & Plan Note (Signed)
History of PAD with known left common iliac artery stenosis by duplex ultrasound 10/07/2022.  She has normal ABIs at denies claudication.  Will continue to follow her noninvasively.

## 2022-11-06 NOTE — Patient Instructions (Signed)
Medication Instructions:  Your physician recommends that you continue on your current medications as directed. Please refer to the Current Medication list given to you today.  *If you need a refill on your cardiac medications before your next appointment, please call your pharmacy*   Testing/Procedures: Your physician has requested that you have a carotid duplex. This test is an ultrasound of the carotid arteries in your neck. It looks at blood flow through these arteries that supply the brain with blood. Allow one hour for this exam. There are no restrictions or special instructions. This will take place at [address], [address]. To do in May 2025.  Your physician has requested that you have an Aorta/Iliac Duplex. This will be take place at [address], [address].  No food after 11PM the night before.  Water is OK. (Don't drink liquids if you have been instructed not to for ANOTHER test) Avoid foods that produce bowel gas, for 24 hours prior to exam (see below). No breakfast, no chewing gum, no smoking or carbonated beverages. Patient may take morning medications with water. Come in for test at least 15 minutes early to register. To do in May 2025.  Your physician has requested that you have an ankle brachial index (ABI). During this test an ultrasound and blood pressure cuff are used to evaluate the arteries that supply the arms and legs with blood. Allow thirty minutes for this exam. There are no restrictions or special instructions. This will take place at [address], [address]. To do in May 2025.     Follow-Up: At [facility], you and your health needs are our priority.  As part of our continuing mission to provide you with exceptional heart care, we have created designated Provider Care Teams.  These Care Teams include your primary Cardiologist (physician) and Advanced Practice Providers (APPs -  Physician Assistants and Nurse Practitioners) who all work  together to provide you with the care you need, when you need it.  We recommend signing up for the patient portal called "MyChart".  Sign up information is provided on this After Visit Summary.  MyChart is used to connect with patients for Virtual Visits (Telemedicine).  Patients are able to view lab/test results, encounter notes, upcoming appointments, etc.  Non-urgent messages can be sent to your provider as well.   To learn more about what you can do with MyChart, go to [email or web address].    Your next appointment:   We will see you on an as needed basis.  Provider:   Nanetta Batty, MD

## 2022-11-06 NOTE — Progress Notes (Signed)
11/06/2022 [name]   September 12, 1950  [id number]  Primary Physician Adrian Prince, MD Primary Cardiologist: Runell Gess MD FACP, [place], [place], [place]  HPI:  [name] is a 72 y.o.    thin appearing married Caucasian female with no children who I last saw in the office 10/26/2019.Marland Kitchen  She was referred by Dr. Eden Emms, her cardiologist for evaluation of left subclavian artery stenosis and symptoms related to this.     Her risk factors include treated hypertension and hyperlipidemia.  She stopped smoking in 2008 after coronary intervention.  She is never had a heart attack or stroke.  She denies chest pain or shortness of breath.  There is no family history for heart disease.  She had anterior MI in 1998 and had bare-metal stenting of her LAD and had a re-intervention 2008.  She has normal LV function.  Recent carotid Dopplers performed 03/08/2019 revealed no evidence of ICA stenosis although there was moderate left subclavian artery stenosis with antegrade vertebral flow bilaterally and only a 9 mm blood pressure gradient across the upper extremities.  She is being worked up for peripheral neuropathy at [facility].  I cannot elicit symptoms of left upper extremity claudication.  She does get dizzy on a regular basis over the last 3 to 4 months lasting up to 30 seconds at a time for unclear reasons.  I am not convinced that this is related to subclavian steal at this time.   I did perform lower extremity arterial Doppler studies on her 03/26/2019 revealing a right ABI of 1.0 and a left of 0.87 with moderate disease in her left iliac system although her complaints are of occasional right calf claudication.   Since I saw her in the office 3 years ago she continues to be stable for peripheral vascular point of view.  She denies lower extremity claudication or left upper extremity claudication.  She does have peripheral neuropathy which she describes in both her upper and lower extremities.   Her lower extremity Dopplers revealed normal ABIs bilaterally with a stable mildly elevated velocity in the left common iliac artery.  Her carotid Dopplers revealed a left subclavian artery stenosis with symmetric upper extremity blood pressures and antegrade blood flow of the left vertebral artery without symptoms.   Current Meds  Medication Sig   acetaminophen (TYLENOL) 325 MG tablet Take 2 tablets (650 mg total) by mouth every 6 (six) hours as needed for mild pain (or Fever >/= 101).   clopidogrel (PLAVIX) 75 MG tablet TAKE 1 TABLET BY MOUTH EVERY DAY   cyanocobalamin (VITAMIN B12) 1000 MCG/ML injection Inject 1,000 mcg into the muscle every 30 (thirty) days.   docusate sodium (COLACE) 100 MG capsule Take 100 mg by mouth daily as needed for mild constipation.   DULoxetine (CYMBALTA) 30 MG capsule Take 30 mg by mouth daily.   ezetimibe (ZETIA) 10 MG tablet Take 10 mg by mouth daily.   gabapentin (NEURONTIN) 300 MG capsule Take 1 capsule (300 mg total) by mouth 2 (two) times daily. (Patient taking differently: Take 300 mg by mouth 4 (four) times daily.)   iron polysaccharides (NIFEREX) 150 MG capsule Take 150 mg by mouth daily.   letrozole (FEMARA) 2.5 MG tablet Take 2.5 mg by mouth daily.   losartan (COZAAR) 25 MG tablet Take 25 mg by mouth daily.   Neratinib Maleate (NERLYNX) 40 MG tablet Take 160 mg by mouth daily. 4 tablets = 160 mg   nitroGLYCERIN (NITROSTAT) 0.4  MG SL tablet Place 1 tablet (0.4 mg total) under the tongue every 5 (five) minutes as needed for chest pain.   pantoprazole (PROTONIX) 20 MG tablet Take 20 mg by mouth daily.   simvastatin (ZOCOR) 40 MG tablet TAKE 1 TABLET(40 MG) BY MOUTH DAILY AT 6 PM   Vitamin D, Cholecalciferol, 25 MCG (1000 UT) CAPS Take 2,000 Units by mouth daily.     No Known Allergies  Social History   Socioeconomic History   Marital status: Married    Spouse name: Rosanne Ashing   Number of children: 0   Years of education: 12   Highest education level:  Not on file  Occupational History   Occupation: retired  Tobacco Use   Smoking status: Former    Types: Cigarettes, E-cigarettes    Quit date: 2013    Years since quitting: 11.4    Passive exposure: Never   Smokeless tobacco: Never  Vaping Use   Vaping Use: Some days  Substance and Sexual Activity   Alcohol use: Not Currently   Drug use: No   Sexual activity: Not on file  Other Topics Concern   Not on file  Social History Narrative   Right-handed.   Three cups caffeine daily.   Lives at home with her husband.   Social Determinants of Health   Financial Resource Strain: Not on file  Food Insecurity: Not on file  Transportation Needs: Not on file  Physical Activity: Not on file  Stress: Not on file  Social Connections: Not on file  Intimate Partner Violence: Not on file     Review of Systems: General: negative for chills, fever, night sweats or weight changes.  Cardiovascular: negative for chest pain, dyspnea on exertion, edema, orthopnea, palpitations, paroxysmal nocturnal dyspnea or shortness of breath Dermatological: negative for rash Respiratory: negative for cough or wheezing Urologic: negative for hematuria Abdominal: negative for nausea, vomiting, diarrhea, bright red blood per rectum, melena, or hematemesis Neurologic: negative for visual changes, syncope, or dizziness All other systems reviewed and are otherwise negative except as noted above.    Blood pressure (!) 189/91, pulse 74, height 5\' 3"  (1.6 m), weight 100 lb 3.2 oz (45.5 kg), SpO2 95 %.  General appearance: alert and no distress Neck: no adenopathy, no JVD, supple, symmetrical, trachea midline, thyroid not enlarged, symmetric, no tenderness/mass/nodules, and left carotid/subclavian bruit Lungs: clear to auscultation bilaterally Heart: regular rate and rhythm, S1, S2 normal, no murmur, click, rub or gallop Extremities: extremities normal, atraumatic, no cyanosis or edema Pulses: 2+ and  symmetric Skin: Skin color, texture, turgor normal. No rashes or lesions Neurologic: Grossly normal  EKG sinus rhythm at 74 occasional PACs.  I personally reviewed this EKG.  ASSESSMENT AND PLAN:   Abdominal aortic aneurysm (AAA) 3.0 cm to 5.0 cm in diameter in female [facility]) History of small abdominal aortic aneurysm measuring 3.5 cm by recent ultrasound performed 10/07/2022.  This will be repeated on an annual basis.  Stenosis of left subclavian artery (HCC) Asymptomatic left subclavian artery stenosis by duplex ultrasound performed 10/07/2022.  She has no upper extremity blood pressure differential and antegrade vertebral blood blood flow on the left side.  She denies upper extremity claudication.  Will continue to follow this noninvasively.  Peripheral arterial disease (HCC) History of PAD with known left common iliac artery stenosis by duplex ultrasound 10/07/2022.  She has normal ABIs at denies claudication.  Will continue to follow her noninvasively.     Runell Gess MD FACP,FACC,FAHA, [facility] 11/06/2022  11:51 AM

## 2022-11-22 DIAGNOSIS — C801 Malignant (primary) neoplasm, unspecified: Secondary | ICD-10-CM | POA: Diagnosis not present

## 2022-11-22 DIAGNOSIS — Z859 Personal history of malignant neoplasm, unspecified: Secondary | ICD-10-CM | POA: Diagnosis not present

## 2022-11-22 DIAGNOSIS — G63 Polyneuropathy in diseases classified elsewhere: Secondary | ICD-10-CM | POA: Diagnosis not present

## 2022-11-22 DIAGNOSIS — G549 Nerve root and plexus disorder, unspecified: Secondary | ICD-10-CM | POA: Diagnosis not present

## 2022-11-22 DIAGNOSIS — Z9221 Personal history of antineoplastic chemotherapy: Secondary | ICD-10-CM | POA: Diagnosis not present

## 2022-11-22 DIAGNOSIS — G5621 Lesion of ulnar nerve, right upper limb: Secondary | ICD-10-CM | POA: Diagnosis not present

## 2022-11-22 DIAGNOSIS — Z923 Personal history of irradiation: Secondary | ICD-10-CM | POA: Diagnosis not present

## 2022-11-22 DIAGNOSIS — G608 Other hereditary and idiopathic neuropathies: Secondary | ICD-10-CM | POA: Diagnosis not present

## 2022-11-22 DIAGNOSIS — G5601 Carpal tunnel syndrome, right upper limb: Secondary | ICD-10-CM | POA: Diagnosis not present

## 2022-11-22 DIAGNOSIS — R2689 Other abnormalities of gait and mobility: Secondary | ICD-10-CM | POA: Diagnosis not present

## 2022-12-10 DIAGNOSIS — C50912 Malignant neoplasm of unspecified site of left female breast: Secondary | ICD-10-CM | POA: Diagnosis not present

## 2022-12-10 DIAGNOSIS — G549 Nerve root and plexus disorder, unspecified: Secondary | ICD-10-CM | POA: Diagnosis not present

## 2022-12-10 DIAGNOSIS — Z853 Personal history of malignant neoplasm of breast: Secondary | ICD-10-CM | POA: Diagnosis not present

## 2022-12-10 DIAGNOSIS — Z170 Estrogen receptor positive status [ER+]: Secondary | ICD-10-CM | POA: Diagnosis not present

## 2022-12-10 DIAGNOSIS — Z08 Encounter for follow-up examination after completed treatment for malignant neoplasm: Secondary | ICD-10-CM | POA: Diagnosis not present

## 2022-12-10 DIAGNOSIS — Z5181 Encounter for therapeutic drug level monitoring: Secondary | ICD-10-CM | POA: Diagnosis not present

## 2022-12-10 DIAGNOSIS — G629 Polyneuropathy, unspecified: Secondary | ICD-10-CM | POA: Diagnosis not present

## 2022-12-10 DIAGNOSIS — M899 Disorder of bone, unspecified: Secondary | ICD-10-CM | POA: Diagnosis not present

## 2022-12-10 DIAGNOSIS — Z79811 Long term (current) use of aromatase inhibitors: Secondary | ICD-10-CM | POA: Diagnosis not present

## 2022-12-10 DIAGNOSIS — C50412 Malignant neoplasm of upper-outer quadrant of left female breast: Secondary | ICD-10-CM | POA: Diagnosis not present

## 2022-12-10 DIAGNOSIS — M792 Neuralgia and neuritis, unspecified: Secondary | ICD-10-CM | POA: Diagnosis not present

## 2022-12-10 DIAGNOSIS — C773 Secondary and unspecified malignant neoplasm of axilla and upper limb lymph nodes: Secondary | ICD-10-CM | POA: Diagnosis not present

## 2022-12-10 DIAGNOSIS — D6481 Anemia due to antineoplastic chemotherapy: Secondary | ICD-10-CM | POA: Diagnosis not present

## 2022-12-18 DIAGNOSIS — Z681 Body mass index (BMI) 19 or less, adult: Secondary | ICD-10-CM | POA: Diagnosis not present

## 2022-12-18 DIAGNOSIS — C50912 Malignant neoplasm of unspecified site of left female breast: Secondary | ICD-10-CM | POA: Diagnosis not present

## 2022-12-18 DIAGNOSIS — Z9221 Personal history of antineoplastic chemotherapy: Secondary | ICD-10-CM | POA: Diagnosis not present

## 2022-12-18 DIAGNOSIS — Z515 Encounter for palliative care: Secondary | ICD-10-CM | POA: Diagnosis not present

## 2022-12-18 DIAGNOSIS — Z9012 Acquired absence of left breast and nipple: Secondary | ICD-10-CM | POA: Diagnosis not present

## 2022-12-18 DIAGNOSIS — E440 Moderate protein-calorie malnutrition: Secondary | ICD-10-CM | POA: Diagnosis not present

## 2022-12-18 DIAGNOSIS — G119 Hereditary ataxia, unspecified: Secondary | ICD-10-CM | POA: Diagnosis not present

## 2022-12-18 DIAGNOSIS — Z923 Personal history of irradiation: Secondary | ICD-10-CM | POA: Diagnosis not present

## 2023-01-20 DIAGNOSIS — Z9012 Acquired absence of left breast and nipple: Secondary | ICD-10-CM | POA: Diagnosis not present

## 2023-01-20 DIAGNOSIS — C50919 Malignant neoplasm of unspecified site of unspecified female breast: Secondary | ICD-10-CM | POA: Diagnosis not present

## 2023-01-20 DIAGNOSIS — D259 Leiomyoma of uterus, unspecified: Secondary | ICD-10-CM | POA: Diagnosis not present

## 2023-01-20 DIAGNOSIS — R932 Abnormal findings on diagnostic imaging of liver and biliary tract: Secondary | ICD-10-CM | POA: Diagnosis not present

## 2023-01-20 DIAGNOSIS — I2510 Atherosclerotic heart disease of native coronary artery without angina pectoris: Secondary | ICD-10-CM | POA: Diagnosis not present

## 2023-01-20 DIAGNOSIS — I700 Atherosclerosis of aorta: Secondary | ICD-10-CM | POA: Diagnosis not present

## 2023-01-20 DIAGNOSIS — C50912 Malignant neoplasm of unspecified site of left female breast: Secondary | ICD-10-CM | POA: Diagnosis not present

## 2023-01-20 DIAGNOSIS — I7143 Infrarenal abdominal aortic aneurysm, without rupture: Secondary | ICD-10-CM | POA: Diagnosis not present

## 2023-01-20 DIAGNOSIS — K839 Disease of biliary tract, unspecified: Secondary | ICD-10-CM | POA: Diagnosis not present

## 2023-01-20 DIAGNOSIS — J439 Emphysema, unspecified: Secondary | ICD-10-CM | POA: Diagnosis not present

## 2023-02-07 DIAGNOSIS — G5603 Carpal tunnel syndrome, bilateral upper limbs: Secondary | ICD-10-CM | POA: Diagnosis not present

## 2023-02-07 DIAGNOSIS — G5621 Lesion of ulnar nerve, right upper limb: Secondary | ICD-10-CM | POA: Diagnosis not present

## 2023-02-19 DIAGNOSIS — D692 Other nonthrombocytopenic purpura: Secondary | ICD-10-CM | POA: Diagnosis not present

## 2023-02-19 DIAGNOSIS — Z87891 Personal history of nicotine dependence: Secondary | ICD-10-CM | POA: Diagnosis not present

## 2023-02-19 DIAGNOSIS — Z681 Body mass index (BMI) 19 or less, adult: Secondary | ICD-10-CM | POA: Diagnosis not present

## 2023-02-19 DIAGNOSIS — F330 Major depressive disorder, recurrent, mild: Secondary | ICD-10-CM | POA: Diagnosis not present

## 2023-02-19 DIAGNOSIS — G629 Polyneuropathy, unspecified: Secondary | ICD-10-CM | POA: Diagnosis not present

## 2023-02-19 DIAGNOSIS — Z515 Encounter for palliative care: Secondary | ICD-10-CM | POA: Diagnosis not present

## 2023-02-19 DIAGNOSIS — J449 Chronic obstructive pulmonary disease, unspecified: Secondary | ICD-10-CM | POA: Diagnosis not present

## 2023-03-04 ENCOUNTER — Emergency Department (HOSPITAL_BASED_OUTPATIENT_CLINIC_OR_DEPARTMENT_OTHER): Payer: PPO

## 2023-03-04 ENCOUNTER — Emergency Department (HOSPITAL_BASED_OUTPATIENT_CLINIC_OR_DEPARTMENT_OTHER)
Admission: EM | Admit: 2023-03-04 | Discharge: 2023-03-04 | Disposition: A | Discharge status: Home / Self Care | Payer: PPO | Attending: Emergency Medicine | Admitting: Emergency Medicine

## 2023-03-04 ENCOUNTER — Other Ambulatory Visit: Payer: Self-pay

## 2023-03-04 DIAGNOSIS — D649 Anemia, unspecified: Secondary | ICD-10-CM | POA: Insufficient documentation

## 2023-03-04 DIAGNOSIS — R42 Dizziness and giddiness: Secondary | ICD-10-CM | POA: Diagnosis not present

## 2023-03-04 DIAGNOSIS — E871 Hypo-osmolality and hyponatremia: Secondary | ICD-10-CM | POA: Insufficient documentation

## 2023-03-04 DIAGNOSIS — S42292A Other displaced fracture of upper end of left humerus, initial encounter for closed fracture: Secondary | ICD-10-CM | POA: Diagnosis not present

## 2023-03-04 DIAGNOSIS — R5383 Other fatigue: Secondary | ICD-10-CM | POA: Diagnosis not present

## 2023-03-04 DIAGNOSIS — I951 Orthostatic hypotension: Secondary | ICD-10-CM | POA: Insufficient documentation

## 2023-03-04 LAB — BASIC METABOLIC PANEL
Anion gap: 7 (ref 5–15)
BUN: 11 mg/dL (ref 8–23)
CO2: 29 mmol/L (ref 22–32)
Calcium: 9.1 mg/dL (ref 8.9–10.3)
Chloride: 86 mmol/L — ABNORMAL LOW (ref 98–111)
Creatinine, Ser: 0.65 mg/dL (ref 0.44–1.00)
GFR, Estimated: >60 mL/min (ref >60)
Glucose, Bld: 92 mg/dL (ref 70–99)
Potassium: 3.7 mmol/L (ref 3.5–5.1)
Sodium: 122 mmol/L — ABNORMAL LOW (ref 135–145)

## 2023-03-04 LAB — URINALYSIS, ROUTINE W REFLEX MICROSCOPIC
Bacteria, UA: NONE SEEN
Bilirubin Urine: NEGATIVE
Glucose, UA: NEGATIVE mg/dL
Ketones, ur: NEGATIVE mg/dL
Leukocytes,Ua: NEGATIVE
Nitrite: NEGATIVE
Protein, ur: NEGATIVE mg/dL
Specific Gravity, Urine: <1.005 — ABNORMAL LOW (ref 1.005–1.030)
pH: 6.5 (ref 5.0–8.0)

## 2023-03-04 LAB — CBC WITH DIFFERENTIAL/PLATELET
Abs Immature Granulocytes: 0.01 10*3/uL (ref 0.00–0.07)
Basophils Absolute: 0 10*3/uL (ref 0.0–0.1)
Basophils Relative: 0 %
Eosinophils Absolute: 0.1 10*3/uL (ref 0.0–0.5)
Eosinophils Relative: 1 %
HCT: 31.7 % — ABNORMAL LOW (ref 36.0–46.0)
Hemoglobin: 11.5 g/dL — ABNORMAL LOW (ref 12.0–15.0)
Immature Granulocytes: 0 %
Lymphocytes Relative: 23 %
Lymphs Abs: 1.1 10*3/uL (ref 0.7–4.0)
MCH: 32 pg (ref 26.0–34.0)
MCHC: 36.3 g/dL — ABNORMAL HIGH (ref 30.0–36.0)
MCV: 88.3 fL (ref 80.0–100.0)
Monocytes Absolute: 0.6 10*3/uL (ref 0.1–1.0)
Monocytes Relative: 13 %
Neutro Abs: 3 10*3/uL (ref 1.7–7.7)
Neutrophils Relative %: 63 %
Platelets: 200 10*3/uL (ref 150–400)
RBC: 3.59 MIL/uL — ABNORMAL LOW (ref 3.87–5.11)
RDW: 12.1 % (ref 11.5–15.5)
WBC: 4.7 10*3/uL (ref 4.0–10.5)
nRBC: 0 % (ref 0.0–0.2)

## 2023-03-04 MED ORDER — SODIUM CHLORIDE 0.9 % IV BOLUS
1000.0000 mL | Freq: Once | INTRAVENOUS | Status: AC
  Administered 2023-03-04: 1000 mL via INTRAVENOUS

## 2023-03-04 MED ORDER — HEPARIN SOD (PORK) LOCK FLUSH 100 UNIT/ML IV SOLN
500.0000 [IU] | Freq: Once | INTRAVENOUS | Status: AC
  Administered 2023-03-04: 500 [IU]
  Filled 2023-03-04: qty 5

## 2023-03-04 MED ORDER — SODIUM CHLORIDE 1 G PO TABS
1.0000 g | ORAL_TABLET | Freq: Three times a day (TID) | ORAL | 0 refills | Status: AC
Start: 2023-03-04 — End: 2023-03-09

## 2023-03-04 NOTE — ED Notes (Signed)
Reviewed discharge instructions and home care with pt and family. Pt verbalized understanding and had no further questions. Pt exited ED via wheelchair without complications.

## 2023-03-04 NOTE — Discharge Instructions (Addendum)
Sodium is chronically low.  We will start you on salt tablets for the next 5 days.  Refrain from drinking water for fluid resuscitation, instead drink electrolyte containing solutions like Gatorade or Pedialyte or smart water.  Follow-up with your primary care provider for recheck of your sodium levels.  Your overall presentation is consistent with orthostatic hypotension and you have been fluid resuscitated with a liter bolus of normal saline.  Return for any severe worsening symptoms.  You were offered admission for observation in the setting of your low sodium but declined.  Return for any confusion, seizure-like activity.

## 2023-03-04 NOTE — ED Provider Notes (Signed)
[place] EMERGENCY DEPARTMENT AT [facility] Provider Note   CSN: [id number] Arrival date & time: 03/04/23  [id number]     History  Chief Complaint  Patient presents with   Dizziness    [name] is a 72 y.o. female.   Dizziness    72-year female presenting to the emergency department with positional lightheadedness.  She states that for the last 2 days she has been fatigued and been feeling somewhat dehydrated.  She states that when she goes from a seated to a standing position she gets lightheaded.  She denies any room spinning dizziness.  She has a history of neuropathy but denies any new neurologic deficits.  She has been trying to stay hydrated at home.  No confusion.  She arrives GCS 15, ABC intact.  Home Medications Prior to Admission medications   Medication Sig Start Date End Date Taking? Authorizing Provider  sodium chloride 1 g tablet Take 1 tablet (1 g total) by mouth 3 (three) times daily for 5 days. 03/04/23 03/09/23 Yes Ernie Avena, MD  acetaminophen (TYLENOL) 325 MG tablet Take 2 tablets (650 mg total) by mouth every 6 (six) hours as needed for mild pain (or Fever >/= 101). 08/30/21   Haydee Monica, MD  clopidogrel (PLAVIX) 75 MG tablet TAKE 1 TABLET BY MOUTH EVERY DAY 07/12/22   Wendall Stade, MD  cyanocobalamin (VITAMIN B12) 1000 MCG/ML injection Inject 1,000 mcg into the muscle every 30 (thirty) days. 02/14/22   [provider]  docusate sodium (COLACE) 100 MG capsule Take 100 mg by mouth daily as needed for mild constipation.    [provider]  DULoxetine (CYMBALTA) 30 MG capsule Take 30 mg by mouth daily.    [provider]  ezetimibe (ZETIA) 10 MG tablet Take 10 mg by mouth daily.    [provider]  gabapentin (NEURONTIN) 300 MG capsule Take 1 capsule (300 mg total) by mouth 2 (two) times daily. Patient taking differently: Take 300 mg by mouth 4 (four) times daily. 01/17/20   Nita Sickle K, DO  iron  polysaccharides (NIFEREX) 150 MG capsule Take 150 mg by mouth daily.    [provider]  letrozole (FEMARA) 2.5 MG tablet Take 2.5 mg by mouth daily. 08/05/21   [provider]  loperamide (IMODIUM) 2 MG capsule Take 2 mg by mouth daily as needed for diarrhea or loose stools. Patient not taking: Reported on 11/06/2022    [provider]  losartan (COZAAR) 25 MG tablet Take 25 mg by mouth daily. 04/23/22   [provider]  Neratinib Maleate (NERLYNX) 40 MG tablet Take 160 mg by mouth daily. 4 tablets = 160 mg 09/26/21   [provider]  nitroGLYCERIN (NITROSTAT) 0.4 MG SL tablet Place 1 tablet (0.4 mg total) under the tongue every 5 (five) minutes as needed for chest pain. 09/24/22   Wendall Stade, MD  pantoprazole (PROTONIX) 20 MG tablet Take 20 mg by mouth daily.    [provider]  simvastatin (ZOCOR) 40 MG tablet TAKE 1 TABLET(40 MG) BY MOUTH DAILY AT 6 PM 04/15/22   Wendall Stade, MD  Vitamin D, Cholecalciferol, 25 MCG (1000 UT) CAPS Take 2,000 Units by mouth daily.    [provider]      Allergies    Patient has no known allergies.    Review of Systems   Review of Systems  Neurological:  Positive for light-headedness.  All other systems reviewed and are negative.  Physical Exam Updated Vital Signs BP (!) 169/75   Pulse 62   Temp 97.6 F (36.4 C) (Oral)   Resp 19   Wt 47.2 kg   SpO2 100%   BMI 18.42 kg/m  Physical Exam Vitals and nursing note reviewed.  Constitutional:      General: She is not in acute distress.    Appearance: She is well-developed.  HENT:     Head: Normocephalic and atraumatic.  Eyes:     Conjunctiva/sclera: Conjunctivae normal.  Cardiovascular:     Rate and Rhythm: Normal rate and regular rhythm.     Heart sounds: No murmur heard. Pulmonary:     Effort: Pulmonary effort is normal. No respiratory distress.     Breath sounds: Normal breath sounds.  Chest:     Comments: Right chest wall  port site in place with no surrounding erythema Abdominal:     Palpations: Abdomen is soft.     Tenderness: There is no abdominal tenderness.  Musculoskeletal:        General: No swelling.     Cervical back: Neck supple.  Skin:    General: Skin is warm and dry.     Capillary Refill: Capillary refill takes less than 2 seconds.  Neurological:     Mental Status: She is alert.     Comments: MENTAL STATUS EXAM:    Orientation: Alert and oriented to person, place and time.  Memory: Cooperative, follows commands well.  Language: Speech is clear and language is normal.   CRANIAL NERVES:    CN 2 (Optic): Visual fields intact to confrontation.  CN 3,4,6 (EOM): Pupils equal and reactive to light. Full extraocular eye movement without nystagmus.  CN 5 (Trigeminal): Facial sensation is normal, no weakness of masticatory muscles.  CN 7 (Facial): No facial weakness or asymmetry.  CN 8 (Auditory): Auditory acuity grossly normal.  CN 9,10 (Glossophar): The uvula is midline, the palate elevates symmetrically.  CN 11 (spinal access): Normal sternocleidomastoid and trapezius strength.  CN 12 (Hypoglossal): The tongue is midline. No atrophy or fasciculations.Marland Kitchen   MOTOR:  Muscle Strength: 5/5RUE, 5/5LUE, 5/5RLE, 5/5LLE.   COORDINATION:   Intact finger-to-nose, no tremor.   SENSATION:   Intact to light touch all four extremities.  GAIT: Gait normal without ataxia   Psychiatric:        Mood and Affect: Mood normal.     ED Results / Procedures / Treatments   Labs (all labs ordered are listed, but only abnormal results are displayed) Labs Reviewed  URINALYSIS, ROUTINE W REFLEX MICROSCOPIC - Abnormal; Notable for the following components:      Result Value   Color, Urine COLORLESS (*)    Specific Gravity, Urine <1.005 (*)    Hgb urine dipstick SMALL (*)    All other components within normal limits  BASIC METABOLIC PANEL - Abnormal; Notable for the following components:   Sodium 122 (*)     Chloride 86 (*)    All other components within normal limits  CBC WITH DIFFERENTIAL/PLATELET - Abnormal; Notable for the following components:   RBC 3.59 (*)    Hemoglobin 11.5 (*)    HCT 31.7 (*)    MCHC 36.3 (*)    All other components within normal limits  CBG MONITORING, ED    EKG EKG Interpretation Date/Time:  Tuesday March 04 2023 16:42:32 EDT Ventricular Rate:  71 PR Interval:  159 QRS Duration:  113 QT Interval:  415 QTC Calculation: 451 R Axis:   11  Text Interpretation: Sinus rhythm Incomplete right bundle branch block No significant change since last tracing Confirmed by Ernie Avena ([id number]) on 03/04/2023 5:05:36 PM  Radiology No results found.  Procedures Procedures    Medications Ordered in ED Medications  sodium chloride 0.9 % bolus 1,000 mL (0 mLs Intravenous Stopped 03/04/23 2139)  heparin lock flush 100 unit/mL (500 Units Intracatheter Given 03/04/23 2142)    ED Course/ Medical Decision Making/ A&P                                 Medical Decision Making Amount and/or Complexity of Data Reviewed Labs: ordered. Radiology: ordered.  Risk OTC drugs. Prescription drug management.    72-year female presenting to the emergency department with positional lightheadedness.  She states that for the last 2 days she has been fatigued and been feeling somewhat dehydrated.  She states that when she goes from a seated to a standing position she gets lightheaded.  She denies any room spinning dizziness.  She has a history of neuropathy but denies any new neurologic deficits.  She has been trying to stay hydrated at home.  No confusion.  She arrives GCS 15, ABC intact.  On arrival, the patient was vitally stable, afebrile, not tachycardic or tachypneic, BP 163/70, saturating her percent on room air.  Patient symptoms consistent with orthostatic lightheadedness.  No syncope.  Orthostatic vitals were performed and were borderline.  The patient was administered an IV  fluid bolus.  EKG was performed revealed sinus rhythm, ventricular rate 71, incomplete right bundle blanch block present.  A chest x-ray was performed revealed no focal abnormality.  Laboratory evaluation revealed urinalysis without evidence of UTI, CBC without a leukocytosis, mild anemia present to 11.5, proved from prior measurements in the mid nines.  A BMP revealed hyponatremia to 122 however the patient is asymptomatic.  She appears to have chronic hyponatremia sodium is in the mid 120s.  I offered admission for observation in the setting of a sodium less than 125 however the patient is asymptomatic and would strongly prefer discharge home.  Following and NaCl bolus, she felt symptomatically improved.  Symptoms are distant with borderline orthostatic hypotension in the setting of mild dehydration.  Feeling symptomatically improved following and NaCl bolus.  Will discharge on a 5-day course of salt tablets and advised very close follow-up with her PCP for recheck of her sodium, return precautions provided.  DC Instructions: Sodium is chronically low.  We will start you on salt tablets for the next 5 days.  Refrain from drinking water for fluid resuscitation, instead drink electrolyte containing solutions like Gatorade or Pedialyte or smart water.  Follow-up with your primary care provider for recheck of your sodium levels.  Your overall presentation is consistent with orthostatic hypotension and you have been fluid resuscitated with a liter bolus of normal saline.  Return for any severe worsening symptoms.  You were offered admission for observation in the setting of your low sodium but declined.  Return for any confusion, seizure-like activity.   Final Clinical Impression(s) / ED Diagnoses Final diagnoses:  Orthostatic hypotension  Chronic hyponatremia    Rx / DC Orders ED Discharge Orders          Ordered    sodium chloride 1 g tablet  3 times daily        03/04/23 2123  Ernie Avena, MD 03/08/23 1239

## 2023-03-04 NOTE — ED Notes (Addendum)
Pt ambulated with minimal assistance to the bathroom. No dizziness.

## 2023-03-04 NOTE — ED Triage Notes (Addendum)
Reports fatigue x2 days. Dizzy-worse with positional changes. Seems to be at baseline for deficits with HX neuropathy right limbs weaker than left, speech clear and face symetrical- sees neurologist with [facility]generalize malaise. Finished treatment for breast CA last year- power port right chest. Afebrile. Reports darkening urine. Tries to hydrate well at home. Has been admitted in past for UTI/dehydration with similar presentations. A+Ox4. Shuffles feet to bed.

## 2023-03-11 ENCOUNTER — Encounter (HOSPITAL_COMMUNITY): Payer: PPO

## 2023-03-11 DIAGNOSIS — K5909 Other constipation: Secondary | ICD-10-CM | POA: Diagnosis not present

## 2023-03-11 DIAGNOSIS — E871 Hypo-osmolality and hyponatremia: Secondary | ICD-10-CM | POA: Diagnosis not present

## 2023-03-11 DIAGNOSIS — I10 Essential (primary) hypertension: Secondary | ICD-10-CM | POA: Diagnosis not present

## 2023-03-11 DIAGNOSIS — I951 Orthostatic hypotension: Secondary | ICD-10-CM | POA: Diagnosis not present

## 2023-03-11 DIAGNOSIS — Z23 Encounter for immunization: Secondary | ICD-10-CM | POA: Diagnosis not present

## 2023-03-11 DIAGNOSIS — D509 Iron deficiency anemia, unspecified: Secondary | ICD-10-CM | POA: Diagnosis not present

## 2023-03-11 DIAGNOSIS — G629 Polyneuropathy, unspecified: Secondary | ICD-10-CM | POA: Diagnosis not present

## 2023-03-11 DIAGNOSIS — E781 Pure hyperglyceridemia: Secondary | ICD-10-CM | POA: Diagnosis not present

## 2023-03-11 DIAGNOSIS — Z853 Personal history of malignant neoplasm of breast: Secondary | ICD-10-CM | POA: Diagnosis not present

## 2023-03-26 DIAGNOSIS — E871 Hypo-osmolality and hyponatremia: Secondary | ICD-10-CM | POA: Diagnosis not present

## 2023-04-09 DIAGNOSIS — Z1212 Encounter for screening for malignant neoplasm of rectum: Secondary | ICD-10-CM | POA: Diagnosis not present

## 2023-04-09 DIAGNOSIS — G629 Polyneuropathy, unspecified: Secondary | ICD-10-CM | POA: Diagnosis not present

## 2023-04-09 DIAGNOSIS — E559 Vitamin D deficiency, unspecified: Secondary | ICD-10-CM | POA: Diagnosis not present

## 2023-04-09 DIAGNOSIS — G5601 Carpal tunnel syndrome, right upper limb: Secondary | ICD-10-CM | POA: Diagnosis not present

## 2023-04-09 DIAGNOSIS — D510 Vitamin B12 deficiency anemia due to intrinsic factor deficiency: Secondary | ICD-10-CM | POA: Diagnosis not present

## 2023-04-09 DIAGNOSIS — G5621 Lesion of ulnar nerve, right upper limb: Secondary | ICD-10-CM | POA: Diagnosis not present

## 2023-04-09 DIAGNOSIS — R94131 Abnormal electromyogram [EMG]: Secondary | ICD-10-CM | POA: Diagnosis not present

## 2023-04-09 DIAGNOSIS — Z0000 Encounter for general adult medical examination without abnormal findings: Secondary | ICD-10-CM | POA: Diagnosis not present

## 2023-04-09 DIAGNOSIS — I10 Essential (primary) hypertension: Secondary | ICD-10-CM | POA: Diagnosis not present

## 2023-04-10 ENCOUNTER — Other Ambulatory Visit: Payer: Self-pay | Admitting: Cardiovascular Disease

## 2023-04-15 DIAGNOSIS — Z1339 Encounter for screening examination for other mental health and behavioral disorders: Secondary | ICD-10-CM | POA: Diagnosis not present

## 2023-04-15 DIAGNOSIS — Z853 Personal history of malignant neoplasm of breast: Secondary | ICD-10-CM | POA: Diagnosis not present

## 2023-04-15 DIAGNOSIS — I700 Atherosclerosis of aorta: Secondary | ICD-10-CM | POA: Diagnosis not present

## 2023-04-15 DIAGNOSIS — I6523 Occlusion and stenosis of bilateral carotid arteries: Secondary | ICD-10-CM | POA: Diagnosis not present

## 2023-04-15 DIAGNOSIS — E559 Vitamin D deficiency, unspecified: Secondary | ICD-10-CM | POA: Diagnosis not present

## 2023-04-15 DIAGNOSIS — Z1212 Encounter for screening for malignant neoplasm of rectum: Secondary | ICD-10-CM | POA: Diagnosis not present

## 2023-04-15 DIAGNOSIS — E785 Hyperlipidemia, unspecified: Secondary | ICD-10-CM | POA: Diagnosis not present

## 2023-04-15 DIAGNOSIS — Z0000 Encounter for general adult medical examination without abnormal findings: Secondary | ICD-10-CM | POA: Diagnosis not present

## 2023-04-15 DIAGNOSIS — I2510 Atherosclerotic heart disease of native coronary artery without angina pectoris: Secondary | ICD-10-CM | POA: Diagnosis not present

## 2023-04-15 DIAGNOSIS — Z1331 Encounter for screening for depression: Secondary | ICD-10-CM | POA: Diagnosis not present

## 2023-04-15 DIAGNOSIS — G119 Hereditary ataxia, unspecified: Secondary | ICD-10-CM | POA: Diagnosis not present

## 2023-04-15 DIAGNOSIS — D510 Vitamin B12 deficiency anemia due to intrinsic factor deficiency: Secondary | ICD-10-CM | POA: Diagnosis not present

## 2023-04-15 DIAGNOSIS — J432 Centrilobular emphysema: Secondary | ICD-10-CM | POA: Diagnosis not present

## 2023-04-15 DIAGNOSIS — G5601 Carpal tunnel syndrome, right upper limb: Secondary | ICD-10-CM | POA: Diagnosis not present

## 2023-04-15 DIAGNOSIS — I10 Essential (primary) hypertension: Secondary | ICD-10-CM | POA: Diagnosis not present

## 2023-04-15 DIAGNOSIS — G629 Polyneuropathy, unspecified: Secondary | ICD-10-CM | POA: Diagnosis not present

## 2023-04-15 DIAGNOSIS — G609 Hereditary and idiopathic neuropathy, unspecified: Secondary | ICD-10-CM | POA: Diagnosis not present

## 2023-04-15 DIAGNOSIS — I739 Peripheral vascular disease, unspecified: Secondary | ICD-10-CM | POA: Diagnosis not present

## 2023-04-24 DIAGNOSIS — E785 Hyperlipidemia, unspecified: Secondary | ICD-10-CM | POA: Diagnosis not present

## 2023-04-24 DIAGNOSIS — R7989 Other specified abnormal findings of blood chemistry: Secondary | ICD-10-CM | POA: Diagnosis not present

## 2023-04-24 DIAGNOSIS — I10 Essential (primary) hypertension: Secondary | ICD-10-CM | POA: Diagnosis not present

## 2023-04-24 DIAGNOSIS — G119 Hereditary ataxia, unspecified: Secondary | ICD-10-CM | POA: Diagnosis not present

## 2023-04-24 DIAGNOSIS — Z79899 Other long term (current) drug therapy: Secondary | ICD-10-CM | POA: Diagnosis not present

## 2023-04-28 DIAGNOSIS — D126 Benign neoplasm of colon, unspecified: Secondary | ICD-10-CM | POA: Diagnosis not present

## 2023-04-28 DIAGNOSIS — D509 Iron deficiency anemia, unspecified: Secondary | ICD-10-CM | POA: Diagnosis not present

## 2023-05-06 DIAGNOSIS — F331 Major depressive disorder, recurrent, moderate: Secondary | ICD-10-CM | POA: Diagnosis not present

## 2023-05-06 DIAGNOSIS — D692 Other nonthrombocytopenic purpura: Secondary | ICD-10-CM | POA: Diagnosis not present

## 2023-05-06 DIAGNOSIS — G629 Polyneuropathy, unspecified: Secondary | ICD-10-CM | POA: Diagnosis not present

## 2023-05-06 DIAGNOSIS — Z681 Body mass index (BMI) 19 or less, adult: Secondary | ICD-10-CM | POA: Diagnosis not present

## 2023-05-06 DIAGNOSIS — Z515 Encounter for palliative care: Secondary | ICD-10-CM | POA: Diagnosis not present

## 2023-05-06 DIAGNOSIS — F4321 Adjustment disorder with depressed mood: Secondary | ICD-10-CM | POA: Diagnosis not present

## 2023-05-06 DIAGNOSIS — Z634 Disappearance and death of family member: Secondary | ICD-10-CM | POA: Diagnosis not present

## 2023-06-17 DIAGNOSIS — Z1731 Human epidermal growth factor receptor 2 positive status: Secondary | ICD-10-CM | POA: Diagnosis not present

## 2023-06-17 DIAGNOSIS — C50412 Malignant neoplasm of upper-outer quadrant of left female breast: Secondary | ICD-10-CM | POA: Diagnosis not present

## 2023-06-17 DIAGNOSIS — C50912 Malignant neoplasm of unspecified site of left female breast: Secondary | ICD-10-CM | POA: Diagnosis not present

## 2023-07-09 DIAGNOSIS — C50912 Malignant neoplasm of unspecified site of left female breast: Secondary | ICD-10-CM | POA: Diagnosis not present

## 2023-08-28 DIAGNOSIS — G3281 Cerebellar ataxia in diseases classified elsewhere: Secondary | ICD-10-CM | POA: Diagnosis not present

## 2023-08-28 DIAGNOSIS — R202 Paresthesia of skin: Secondary | ICD-10-CM | POA: Diagnosis not present

## 2023-08-28 DIAGNOSIS — R531 Weakness: Secondary | ICD-10-CM | POA: Diagnosis not present

## 2023-08-28 DIAGNOSIS — R269 Unspecified abnormalities of gait and mobility: Secondary | ICD-10-CM | POA: Diagnosis not present

## 2023-08-28 DIAGNOSIS — R200 Anesthesia of skin: Secondary | ICD-10-CM | POA: Diagnosis not present

## 2023-08-29 DIAGNOSIS — R202 Paresthesia of skin: Secondary | ICD-10-CM | POA: Diagnosis not present

## 2023-08-29 DIAGNOSIS — R269 Unspecified abnormalities of gait and mobility: Secondary | ICD-10-CM | POA: Diagnosis not present

## 2023-08-29 DIAGNOSIS — R531 Weakness: Secondary | ICD-10-CM | POA: Diagnosis not present

## 2023-08-29 DIAGNOSIS — G3281 Cerebellar ataxia in diseases classified elsewhere: Secondary | ICD-10-CM | POA: Diagnosis not present

## 2023-08-29 DIAGNOSIS — R200 Anesthesia of skin: Secondary | ICD-10-CM | POA: Diagnosis not present

## 2023-08-30 DIAGNOSIS — R200 Anesthesia of skin: Secondary | ICD-10-CM | POA: Diagnosis not present

## 2023-08-30 DIAGNOSIS — R269 Unspecified abnormalities of gait and mobility: Secondary | ICD-10-CM | POA: Diagnosis not present

## 2023-08-30 DIAGNOSIS — R202 Paresthesia of skin: Secondary | ICD-10-CM | POA: Diagnosis not present

## 2023-08-30 DIAGNOSIS — G3281 Cerebellar ataxia in diseases classified elsewhere: Secondary | ICD-10-CM | POA: Diagnosis not present

## 2023-08-30 DIAGNOSIS — R531 Weakness: Secondary | ICD-10-CM | POA: Diagnosis not present

## 2023-08-31 DIAGNOSIS — R531 Weakness: Secondary | ICD-10-CM | POA: Diagnosis not present

## 2023-08-31 DIAGNOSIS — R202 Paresthesia of skin: Secondary | ICD-10-CM | POA: Diagnosis not present

## 2023-08-31 DIAGNOSIS — G3281 Cerebellar ataxia in diseases classified elsewhere: Secondary | ICD-10-CM | POA: Diagnosis not present

## 2023-08-31 DIAGNOSIS — R269 Unspecified abnormalities of gait and mobility: Secondary | ICD-10-CM | POA: Diagnosis not present

## 2023-08-31 DIAGNOSIS — R200 Anesthesia of skin: Secondary | ICD-10-CM | POA: Diagnosis not present

## 2023-09-03 ENCOUNTER — Other Ambulatory Visit: Payer: Self-pay | Admitting: Orthopedic Surgery

## 2023-09-03 DIAGNOSIS — G5621 Lesion of ulnar nerve, right upper limb: Secondary | ICD-10-CM | POA: Diagnosis not present

## 2023-09-03 DIAGNOSIS — G5601 Carpal tunnel syndrome, right upper limb: Secondary | ICD-10-CM | POA: Diagnosis not present

## 2023-09-04 ENCOUNTER — Telehealth: Payer: Self-pay | Admitting: Cardiovascular Disease

## 2023-09-04 NOTE — Telephone Encounter (Signed)
   Name: [name]  DOB: 06/06/1950  MRN: [id number]  Primary Cardiologist: Charlton Haws, MD   Preoperative team, please contact this patient and set up a phone call appointment for further preoperative risk assessment. Please obtain consent and complete medication review. Thank you for your help.  I confirm that guidance regarding antiplatelet and oral anticoagulation therapy has been completed and, if necessary, noted below.  She may hold plavix and start ASA while off plavix.   I also confirmed the patient resides in the state of [place]. As per [facility] Medical Board telemedicine laws, the patient must reside in the state in which the provider is licensed.   Roe Rutherford Trellis Guirguis, PA 09/04/2023, 10:09 AM [facility]

## 2023-09-04 NOTE — Telephone Encounter (Signed)
   Pre-operative Risk Assessment    Patient Name: [name]  DOB: [birth year] MRN: [id number]   Date of last office visit:  Date of next office visit:    Request for Surgical Clearance    Procedure:  Right Corpal Tunnel right ulnar nerve decompression at elbow- possible transposition   Date of Surgery:  10-02-23                              Surgeon:  Dr Betha Loa Surgeon's Group or Practice Name:   Phone number:  [phone] Fax number:  [phone]   Type of Clearance Requested:   - Medical  - Pharmacy:  Hold Clopidogrel (Plavix) how many days can she hold her Plavix?   Type of Anesthesia:   Axillary Block   Additional requests/questions:    Signed, Laurence Ferrari   09/04/2023, 9:06 AM

## 2023-09-04 NOTE — Telephone Encounter (Addendum)
 Pt has in office appt 09/17/23 with Dr. Eden Emms. Procedure is not until 10/02/23. D/w preop APP Carlos Levering, NP defer preop clearance to MD appt 09/17/23.

## 2023-09-09 ENCOUNTER — Telehealth (HOSPITAL_COMMUNITY): Payer: Self-pay | Admitting: *Deleted

## 2023-09-09 ENCOUNTER — Telehealth: Payer: Self-pay

## 2023-09-09 DIAGNOSIS — I2510 Atherosclerotic heart disease of native coronary artery without angina pectoris: Secondary | ICD-10-CM

## 2023-09-09 NOTE — Telephone Encounter (Signed)
 Pt given instructions for MPI study.

## 2023-09-09 NOTE — Progress Notes (Signed)
 Date:  09/17/2023   ID:  [name], DOB [birth year], MRN [id number]  PCP:  Alexa Andrews, MD  Cardiologist:   Stann Earnest Electrophysiologist:  None   Evaluation Performed:  Follow-Up Visit  Chief Complaint:  CAD/PVD   History of Present Illness:    73 y.o. . anterior wall MI 1998 Rx with BMS to LAD . Edge restenosis Rx re intervention 2008 Quit smoking since then. Echo done 08/16/13 with EF 55-60% mild MR Has had anemia with polypectomy by Dr Elvin Hammer 2017 and again September 2018. BP tends to run higher in office than at home     Having neuropathic symptoms in legs and especially arms RUE worse  Started with "pinched" nerve Seen by Dr Gracie Lav neurology labs ok only Vit D low Symptoms not helped by Vitamin B shots  MRI no cervical root impingement    Seen by Dr Katheryne Pane for PVD. Has left subclavian stenosis and right CIA stenosis ABI"s in normal range 10/07/22  and carotids with plaque no stenosis US  did show > 50% Left CIA stenosis   Unfortunaetly diagnosed with breast cancer. Paraneoplastic symptoms would seem to have been cause of her neuropathy Has had port a cath placed and being Rx at [facility]   She had TTE at [facility] I reviewed EF 50-55% strain -17.7 She has lost a lot of weight   Had porximal right humerus fracture 08/27/21 24-May-2023hospitalized with azotemia and Cr 4 and dehydration   Husband of 52 years died in 24-May-2023had been sick for a while with renal cancer She has 2 cats and her lab [facility]iz[facility] past away last year   Weight stable and has port a cath for osteoporosis infusion still   Needs right carpal tunnel release with Dr Huntley Mai 10/02/23 Ordered Lexiscan myovue for clearance Done on 09/10/23 and normal no ischemia EF 61%    Past Medical History:  Diagnosis Date   ABDOMINAL AORTIC ANEURYSM    Anemia    Arthritis    HANDS   Blood transfusion without reported diagnosis    AUGUST 5TH 2017 x2    Breast cancer (HCC)    CAD    CONGESTIVE HEART FAILURE, SYSTOLIC, CHRONIC     CT, CHEST, ABNORMAL    GERD (gastroesophageal reflux disease)    Heart attack (HCC)    x 2   History of colon polyps 01/05/2014   adenomatous polyps   HYPERLIPIDEMIA    HYPERTENSION    ISCHEMIC CARDIOMYOPATHY    Lower GI bleed 01/2014   NONSPECIFIC ABN FINDNG RAD&OTH EXAM BILARY TRCT    Numbness and tingling    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   Paresthesia of both hands    TOBACCO ABUSE    Past Surgical History:  Procedure Laterality Date   bare metal stent placement     left anterior descending artery x 2   COLONOSCOPY N/A 01/05/2014   Procedure: COLONOSCOPY;  Surgeon: Nannette Babe, MD;  Location: [facility];  Service: Endoscopy;  Laterality: N/A;   COLONOSCOPY N/A 01/06/2014   Procedure: COLONOSCOPY;  Surgeon: Nannette Babe, MD;  Location: [facility];  Service: Endoscopy;  Laterality: N/A;   COLONOSCOPY     POLYPECTOMY       Current Meds  Medication Sig   acetaminophen (TYLENOL) 325 MG tablet Take 2 tablets (650 mg total) by mouth every 6 (six) hours as needed for mild pain (or Fever >/= 101).   clopidogrel (PLAVIX) 75 MG  tablet TAKE 1 TABLET BY MOUTH EVERY DAY   cyanocobalamin (VITAMIN B12) 1000 MCG/ML injection Inject 1,000 mcg into the muscle every 30 (thirty) days.   docusate sodium (COLACE) 100 MG capsule Take 100 mg by mouth daily as needed for mild constipation.   DULoxetine (CYMBALTA) 30 MG capsule Take 30 mg by mouth daily.   ezetimibe (ZETIA) 10 MG tablet Take 10 mg by mouth daily.   gabapentin (NEURONTIN) 300 MG capsule Take 1 capsule (300 mg total) by mouth 2 (two) times daily. (Patient taking differently: Take 300 mg by mouth 4 (four) times daily.)   iron polysaccharides (NIFEREX) 150 MG capsule Take 150 mg by mouth daily.   letrozole (FEMARA) 2.5 MG tablet Take 2.5 mg by mouth daily.   losartan (COZAAR) 25 MG tablet Take 25 mg by mouth daily.   Neratinib Maleate (NERLYNX) 40 MG tablet Take 160 mg by mouth daily. 4 tablets = 160 mg   nitroGLYCERIN  (NITROSTAT) 0.4 MG SL tablet Place 1 tablet (0.4 mg total) under the tongue every 5 (five) minutes as needed for chest pain.   pantoprazole (PROTONIX) 20 MG tablet Take 20 mg by mouth daily.   simvastatin (ZOCOR) 40 MG tablet TAKE 1 TABLET(40 MG) BY MOUTH DAILY AT 6 PM   Vitamin D, Cholecalciferol, 25 MCG (1000 UT) CAPS Take 2,000 Units by mouth daily.     Allergies:   Patient has no known allergies.   Social History   Tobacco Use   Smoking status: Former    Current packs/day: 0.00    Types: Cigarettes, E-cigarettes    Quit date: 2013    Years since quitting: 12.2    Passive exposure: Never   Smokeless tobacco: Never  Vaping Use   Vaping status: Some Days  Substance Use Topics   Alcohol use: Not Currently   Drug use: No     Family Hx: The patient's family history includes Anemia in her sister; Colon polyps in her father and sister; Depression (age of onset: 29) in her father; Diabetes in her maternal grandmother; Heart attack in her father; Lung cancer in her sister; Lymphoma (age of onset: 77) in her mother. There is no history of Colon cancer, Esophageal cancer, Rectal cancer, or Stomach cancer.  ROS:   Please see the history of present illness.     All other systems reviewed and are negative.   Prior CV studies:   The following studies were reviewed today:  Carotid 03/02/20  AAA US  03/02/20 Left CIA velocity 4.6 m/sec   Labs/Other Tests and Data Reviewed:    EKG:  NSR rate 68 normal ECG 08/11/17, 09/17/2023 NSR rate 63 normal   Recent Labs: 03/04/2023: BUN 11; Creatinine, Ser 0.65; Hemoglobin 11.5; Platelets 200; Potassium 3.7; Sodium 122   Recent Lipid Panel Lab Results  Component Value Date/Time   CHOL 161 07/28/2013 12:18 PM   TRIG 78.0 07/28/2013 12:18 PM   HDL 65.30 07/28/2013 12:18 PM   CHOLHDL 2 07/28/2013 12:18 PM   LDLCALC 80 07/28/2013 12:18 PM    Wt Readings from Last 3 Encounters:  09/17/23 110 lb 3.2 oz (50 kg)  09/10/23 104 lb (47.2 kg)   03/04/23 104 lb (47.2 kg)     Objective:    Vital Signs:  BP 120/62   Pulse 79   Ht 5\' 3"  (1.6 m)   Wt 110 lb 3.2 oz (50 kg)   SpO2 95%   BMI 19.52 kg/m    Affect appropriate Chronically ill white  female  HEENT: normal Neck supple with no adenopathy JVP normal bilateral bruits no thyromegaly Lungs clear with no wheezing and good diaphragmatic motion Heart:  S1/S2 no murmur, no rub, gallop or click PMI normal port a cath over right chest  Abdomen: benighn, BS positve, no tenderness, no AAA no bruit.  No HSM or HJR Distal pulses intact with no bruits No edema Neuro non-focal sensory deficits arms/legs  Skin warm and dry No muscular weakness   ASSESSMENT & PLAN:    CAD: Stable with no angina and good activity level.  Continue medical Rx  LAD stent in 94 with re intervention 2006-09-28 low dose beta blocker due to bradycardia Pending right ulnar surgery release 10/02/23 Update lexiscan myovue for clearance    HTN: Well controlled.  Continue current medications and low sodium Dash type diet.  Home readings normal white coat component    HLD:  On statin labs with primary   Anemia:  On iron improved no bleeding  02/19/16 EGD normal colonoscopy with removal of 8 polyps Dr Elvin Hammer did repeat Colonoscopy 02/13/18 and removed 2 more polyps    PVD: f/u Dr Katheryne Pane moderate LLE dx and left subclavian stenosis without arm weakness ABI's normal 10/13/2022 Duplex 10/13/2022  no ICA Stenosis Left subclavian stenosis but antegrade vertebral flow  Peak velocity 3..02  m/sec  AAA only 3.5 cm by duplex Oct 13, 2022    Depression: Sister died 2016/09/27  husband with DCM and renal cell cancer PRN valium    Neuropathy:  F/u neurology lab work and MRI's unrevealing to date Failed trial of iv steroids 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  The importance of social distancing was discussed today.   Medication Adjustments/Labs and Tests Ordered: Current medicines are reviewed at length with the patient today.  Concerns regarding medicines are outlined above.   Tests Ordered:  Lexiscan myovue   Medication Changes:  None   Disposition:  Follow up with Dr Katheryne Pane [facility] and me in a year   Signed, Janelle Mediate, MD  09/17/2023 1:55 PM    [facility]

## 2023-09-09 NOTE — Telephone Encounter (Signed)
-----   Message from Charlton Haws sent at 09/09/2023 12:41 PM EDT ----- See if we can get lexiscan myovue this week or next to clear for ulnar surgery 5/1 history of LAD stent

## 2023-09-09 NOTE — Telephone Encounter (Signed)
 Called patient to let her know a test was being ordered and went over lexiscan instructions.

## 2023-09-10 ENCOUNTER — Ambulatory Visit (HOSPITAL_COMMUNITY): Attending: Cardiology

## 2023-09-10 DIAGNOSIS — I2510 Atherosclerotic heart disease of native coronary artery without angina pectoris: Secondary | ICD-10-CM | POA: Diagnosis not present

## 2023-09-10 DIAGNOSIS — I2583 Coronary atherosclerosis due to lipid rich plaque: Secondary | ICD-10-CM | POA: Diagnosis not present

## 2023-09-10 LAB — MYOCARDIAL PERFUSION IMAGING
Base ST Depression (mm): 0 mm
LV dias vol: 55 mL (ref 46–106)
LV sys vol: 22 mL
Nuc Stress EF: 61 %
Peak HR: 102 {beats}/min
Rest HR: 70 {beats}/min
Rest Nuclear Isotope Dose: 10.8 mCi
SDS: 1
SRS: 0
SSS: 1
ST Depression (mm): 0 mm
Stress Nuclear Isotope Dose: 30.5 mCi
TID: 0.93

## 2023-09-10 MED ORDER — TECHNETIUM TC 99M TETROFOSMIN IV KIT
10.8000 | PACK | Freq: Once | INTRAVENOUS | Status: AC | PRN
  Administered 2023-09-10: 10.8 via INTRAVENOUS

## 2023-09-10 MED ORDER — TECHNETIUM TC 99M TETROFOSMIN IV KIT
30.5000 | PACK | Freq: Once | INTRAVENOUS | Status: AC | PRN
  Administered 2023-09-10: 30.5 via INTRAVENOUS

## 2023-09-10 MED ORDER — REGADENOSON 0.4 MG/5ML IV SOLN
0.4000 mg | Freq: Once | INTRAVENOUS | Status: AC
  Administered 2023-09-10: 0.4 mg via INTRAVENOUS

## 2023-09-11 NOTE — Telephone Encounter (Signed)
   Patient Name: RAIGEN JAGIELSKI  DOB: 02/06/1951 MRN: [id number]  Primary Cardiologist: Charlton Haws, MD  Chart reviewed as part of pre-operative protocol coverage. Given past medical history and time since last visit, based on ACC/AHA guidelines, MCKYNLIE VANDERSLICE is at acceptable risk for the planned procedure without further cardiovascular testing.  Patient underwent Lexiscan Myoview showed no evidence of ischemia and was low risk.  Per Dr. Eden Emms patient is cleared to proceed with scheduled procedure.  She may hold plavix 5 days prior to procedure and start ASA 81 mg while off plavix and resuming Plavix following procedure and discontinue ASA 81 mg.  The patient was advised that if she develops new symptoms prior to surgery to contact our office to arrange for a follow-up visit, and she verbalized understanding.  I will route this recommendation to the requesting party via Epic fax function and remove from pre-op pool.  Please call with questions.  Napoleon Form, Leodis Rains, NP 09/11/2023, 8:12 AM

## 2023-09-17 ENCOUNTER — Ambulatory Visit: Payer: PPO | Attending: Cardiovascular Disease | Admitting: Cardiovascular Disease

## 2023-09-17 ENCOUNTER — Encounter: Payer: Self-pay | Admitting: Cardiovascular Disease

## 2023-09-17 ENCOUNTER — Inpatient Hospital Stay (HOSPITAL_COMMUNITY): Admission: RE | Admit: 2023-09-17 | Payer: PPO | Source: Ambulatory Visit

## 2023-09-17 VITALS — BP 120/62 | HR 79 | Ht 63.0 in | Wt 110.2 lb

## 2023-09-17 DIAGNOSIS — Z01810 Encounter for preprocedural cardiovascular examination: Secondary | ICD-10-CM | POA: Diagnosis not present

## 2023-09-17 DIAGNOSIS — I2510 Atherosclerotic heart disease of native coronary artery without angina pectoris: Secondary | ICD-10-CM | POA: Diagnosis not present

## 2023-09-17 DIAGNOSIS — G629 Polyneuropathy, unspecified: Secondary | ICD-10-CM | POA: Diagnosis not present

## 2023-09-17 DIAGNOSIS — I6523 Occlusion and stenosis of bilateral carotid arteries: Secondary | ICD-10-CM

## 2023-09-17 DIAGNOSIS — I714 Abdominal aortic aneurysm, without rupture, unspecified: Secondary | ICD-10-CM

## 2023-09-17 NOTE — Patient Instructions (Signed)
 Medication Instructions:  Your physician recommends that you continue on your current medications as directed. Please refer to the Current Medication list given to you today.  *If you need a refill on your cardiac medications before your next appointment, please call your pharmacy*  Lab Work: If you have labs (blood work) drawn today and your tests are completely normal, you will receive your results only by: MyChart Message (if you have MyChart) OR A paper copy in the mail If you have any lab test that is abnormal or we need to change your treatment, we will call you to review the results.  Testing/Procedures: None ordered today.  Follow-Up: At [facility], you and your health needs are our priority.  As part of our continuing mission to provide you with exceptional heart care, our providers are all part of one team.  This team includes your primary Cardiologist (physician) and Advanced Practice Providers or APPs (Physician Assistants and Nurse Practitioners) who all work together to provide you with the care you need, when you need it.  Your next appointment:   1 year(s)  Provider:   Charlton Haws, MD     We recommend signing up for the patient portal called "MyChart".  Sign up information is provided on this After Visit Summary.  MyChart is used to connect with patients for Virtual Visits (Telemedicine).  Patients are able to view lab/test results, encounter notes, upcoming appointments, etc.  Non-urgent messages can be sent to your provider as well.   To learn more about what you can do with MyChart, go to [email or web address].   Other Instructions       1st Floor: - Lobby - Registration  - Pharmacy  - Lab - Cafe  2nd Floor: - PV Lab - Diagnostic Testing (echo, CT, nuclear med)  3rd Floor: - Vacant  4th Floor: - TCTS (cardiothoracic surgery) - AFib Clinic - Structural Heart Clinic - Vascular Surgery  - Vascular Ultrasound  5th Floor: - HeartCare  Cardiology (general and EP) - Clinical Pharmacy for coumadin, hypertension, lipid, weight-loss medications, and med management appointments    Valet parking services will be available as well.

## 2023-09-26 ENCOUNTER — Other Ambulatory Visit: Payer: Self-pay

## 2023-09-26 ENCOUNTER — Encounter (HOSPITAL_BASED_OUTPATIENT_CLINIC_OR_DEPARTMENT_OTHER): Payer: Self-pay | Admitting: Orthopedic Surgery

## 2023-09-28 DIAGNOSIS — R200 Anesthesia of skin: Secondary | ICD-10-CM | POA: Diagnosis not present

## 2023-09-28 DIAGNOSIS — R202 Paresthesia of skin: Secondary | ICD-10-CM | POA: Diagnosis not present

## 2023-09-28 DIAGNOSIS — G3281 Cerebellar ataxia in diseases classified elsewhere: Secondary | ICD-10-CM | POA: Diagnosis not present

## 2023-09-28 DIAGNOSIS — R531 Weakness: Secondary | ICD-10-CM | POA: Diagnosis not present

## 2023-09-28 DIAGNOSIS — R269 Unspecified abnormalities of gait and mobility: Secondary | ICD-10-CM | POA: Diagnosis not present

## 2023-09-30 DIAGNOSIS — R531 Weakness: Secondary | ICD-10-CM | POA: Diagnosis not present

## 2023-09-30 DIAGNOSIS — R202 Paresthesia of skin: Secondary | ICD-10-CM | POA: Diagnosis not present

## 2023-09-30 DIAGNOSIS — R200 Anesthesia of skin: Secondary | ICD-10-CM | POA: Diagnosis not present

## 2023-09-30 DIAGNOSIS — G3281 Cerebellar ataxia in diseases classified elsewhere: Secondary | ICD-10-CM | POA: Diagnosis not present

## 2023-09-30 DIAGNOSIS — R269 Unspecified abnormalities of gait and mobility: Secondary | ICD-10-CM | POA: Diagnosis not present

## 2023-10-02 ENCOUNTER — Other Ambulatory Visit: Payer: Self-pay

## 2023-10-02 ENCOUNTER — Ambulatory Visit (HOSPITAL_BASED_OUTPATIENT_CLINIC_OR_DEPARTMENT_OTHER): Admitting: Anesthesiology

## 2023-10-02 ENCOUNTER — Ambulatory Visit (HOSPITAL_BASED_OUTPATIENT_CLINIC_OR_DEPARTMENT_OTHER)
Admission: RE | Admit: 2023-10-02 | Discharge: 2023-10-02 | Disposition: A | Discharge status: Home / Self Care | Attending: Orthopedic Surgery | Admitting: Orthopedic Surgery

## 2023-10-02 ENCOUNTER — Encounter (HOSPITAL_BASED_OUTPATIENT_CLINIC_OR_DEPARTMENT_OTHER)
Admission: RE | Disposition: A | Discharge status: Home / Self Care | Payer: Self-pay | Source: Home / Self Care | Attending: Orthopedic Surgery

## 2023-10-02 ENCOUNTER — Encounter (HOSPITAL_BASED_OUTPATIENT_CLINIC_OR_DEPARTMENT_OTHER): Payer: Self-pay | Admitting: Orthopedic Surgery

## 2023-10-02 DIAGNOSIS — Z955 Presence of coronary angioplasty implant and graft: Secondary | ICD-10-CM | POA: Insufficient documentation

## 2023-10-02 DIAGNOSIS — G5621 Lesion of ulnar nerve, right upper limb: Secondary | ICD-10-CM | POA: Insufficient documentation

## 2023-10-02 DIAGNOSIS — J449 Chronic obstructive pulmonary disease, unspecified: Secondary | ICD-10-CM

## 2023-10-02 DIAGNOSIS — G5601 Carpal tunnel syndrome, right upper limb: Secondary | ICD-10-CM

## 2023-10-02 DIAGNOSIS — Z87891 Personal history of nicotine dependence: Secondary | ICD-10-CM | POA: Insufficient documentation

## 2023-10-02 DIAGNOSIS — K219 Gastro-esophageal reflux disease without esophagitis: Secondary | ICD-10-CM | POA: Insufficient documentation

## 2023-10-02 DIAGNOSIS — I252 Old myocardial infarction: Secondary | ICD-10-CM | POA: Insufficient documentation

## 2023-10-02 DIAGNOSIS — I110 Hypertensive heart disease with heart failure: Secondary | ICD-10-CM | POA: Diagnosis not present

## 2023-10-02 DIAGNOSIS — I2510 Atherosclerotic heart disease of native coronary artery without angina pectoris: Secondary | ICD-10-CM | POA: Diagnosis not present

## 2023-10-02 DIAGNOSIS — I7389 Other specified peripheral vascular diseases: Secondary | ICD-10-CM | POA: Diagnosis not present

## 2023-10-02 DIAGNOSIS — I5022 Chronic systolic (congestive) heart failure: Secondary | ICD-10-CM | POA: Insufficient documentation

## 2023-10-02 DIAGNOSIS — I5032 Chronic diastolic (congestive) heart failure: Secondary | ICD-10-CM | POA: Diagnosis not present

## 2023-10-02 DIAGNOSIS — Z01818 Encounter for other preprocedural examination: Secondary | ICD-10-CM

## 2023-10-02 HISTORY — DX: Chronic obstructive pulmonary disease, unspecified: J44.9

## 2023-10-02 HISTORY — PX: CARPAL TUNNEL RELEASE: SHX101

## 2023-10-02 HISTORY — PX: ULNAR NERVE TRANSPOSITION: SHX2595

## 2023-10-02 SURGERY — CARPAL TUNNEL RELEASE
Anesthesia: Monitor Anesthesia Care | Site: Wrist | Laterality: Right

## 2023-10-02 MED ORDER — LIDOCAINE HCL (CARDIAC) PF 100 MG/5ML IV SOSY
PREFILLED_SYRINGE | INTRAVENOUS | Status: DC | PRN
  Administered 2023-10-02: 50 mg via INTRAVENOUS

## 2023-10-02 MED ORDER — MIDAZOLAM HCL 2 MG/2ML IJ SOLN
INTRAMUSCULAR | Status: AC
  Filled 2023-10-02: qty 2

## 2023-10-02 MED ORDER — ACETAMINOPHEN 500 MG PO TABS
ORAL_TABLET | ORAL | Status: AC
  Filled 2023-10-02: qty 1

## 2023-10-02 MED ORDER — OXYCODONE HCL 5 MG PO TABS: 5.0000 mg | ORAL_TABLET | Freq: Once | ORAL | Status: DC | PRN

## 2023-10-02 MED ORDER — FENTANYL CITRATE (PF) 100 MCG/2ML IJ SOLN
50.0000 ug | Freq: Once | INTRAMUSCULAR | Status: AC
  Administered 2023-10-02: 50 ug via INTRAVENOUS

## 2023-10-02 MED ORDER — FENTANYL CITRATE (PF) 100 MCG/2ML IJ SOLN
INTRAMUSCULAR | Status: AC
Start: 2023-10-02 — End: ?
  Filled 2023-10-02: qty 2

## 2023-10-02 MED ORDER — DEXAMETHASONE SODIUM PHOSPHATE 10 MG/ML IJ SOLN
INTRAMUSCULAR | Status: DC | PRN
  Administered 2023-10-02: 5 mg via INTRAVENOUS

## 2023-10-02 MED ORDER — 0.9 % SODIUM CHLORIDE (POUR BTL) OPTIME
TOPICAL | Status: DC | PRN
  Administered 2023-10-02: 200 mL

## 2023-10-02 MED ORDER — OXYCODONE HCL 5 MG/5ML PO SOLN: 5.0000 mg | Freq: Once | ORAL | Status: DC | PRN

## 2023-10-02 MED ORDER — CEFAZOLIN SODIUM-DEXTROSE 2-4 GM/100ML-% IV SOLN
INTRAVENOUS | Status: AC
  Filled 2023-10-02: qty 100

## 2023-10-02 MED ORDER — CEFAZOLIN SODIUM-DEXTROSE 2-4 GM/100ML-% IV SOLN
2.0000 g | INTRAVENOUS | Status: AC
  Administered 2023-10-02: 2 g via INTRAVENOUS

## 2023-10-02 MED ORDER — ACETAMINOPHEN 500 MG PO TABS
1000.0000 mg | ORAL_TABLET | Freq: Once | ORAL | Status: DC
Start: 2023-10-02 — End: 2023-10-02

## 2023-10-02 MED ORDER — DEXAMETHASONE SODIUM PHOSPHATE 10 MG/ML IJ SOLN
INTRAMUSCULAR | Status: DC | PRN
Start: 2023-10-02 — End: 2023-10-02
  Administered 2023-10-02: 10 mg

## 2023-10-02 MED ORDER — HYDROCODONE-ACETAMINOPHEN 5-325 MG PO TABS: 1.0000 | ORAL_TABLET | Freq: Four times a day (QID) | ORAL | 0 refills | Status: AC | PRN

## 2023-10-02 MED ORDER — ROPIVACAINE HCL 5 MG/ML IJ SOLN
INTRAMUSCULAR | Status: DC | PRN
  Administered 2023-10-02: 20 mL via PERINEURAL

## 2023-10-02 MED ORDER — PROPOFOL 500 MG/50ML IV EMUL
INTRAVENOUS | Status: DC | PRN
  Administered 2023-10-02: 100 ug/kg/min via INTRAVENOUS

## 2023-10-02 MED ORDER — ACETAMINOPHEN 500 MG PO TABS
500.0000 mg | ORAL_TABLET | Freq: Once | ORAL | Status: AC
  Administered 2023-10-02: 500 mg via ORAL

## 2023-10-02 MED ORDER — FENTANYL CITRATE (PF) 100 MCG/2ML IJ SOLN: 25.0000 ug | INTRAMUSCULAR | Status: DC | PRN

## 2023-10-02 MED ORDER — MIDAZOLAM HCL 2 MG/2ML IJ SOLN
1.0000 mg | Freq: Once | INTRAMUSCULAR | Status: AC
Start: 2023-10-02 — End: 2023-10-02
  Administered 2023-10-02: 1 mg via INTRAVENOUS

## 2023-10-02 MED ORDER — AMISULPRIDE (ANTIEMETIC) 5 MG/2ML IV SOLN: 10.0000 mg | Freq: Once | INTRAVENOUS | Status: DC | PRN

## 2023-10-02 MED ORDER — ONDANSETRON HCL 4 MG/2ML IJ SOLN
INTRAMUSCULAR | Status: DC | PRN
  Administered 2023-10-02: 4 mg via INTRAVENOUS

## 2023-10-02 MED ORDER — LACTATED RINGERS IV SOLN: INTRAVENOUS | Status: DC

## 2023-10-02 MED ORDER — ONDANSETRON HCL 4 MG/2ML IJ SOLN: 4.0000 mg | Freq: Once | INTRAMUSCULAR | Status: DC | PRN

## 2023-10-02 SURGICAL SUPPLY — 45 items
BLADE MINI RND TIP GREEN BEAV (BLADE) IMPLANT
BLADE SURG 15 STRL LF DISP TIS (BLADE) ×4 IMPLANT
BNDG ELASTIC 3INX 5YD STR LF (GAUZE/BANDAGES/DRESSINGS) ×4 IMPLANT
BNDG ELASTIC 4INX 5YD STR LF (GAUZE/BANDAGES/DRESSINGS) ×2 IMPLANT
BNDG ESMARK 4X9 LF (GAUZE/BANDAGES/DRESSINGS) ×2 IMPLANT
BNDG GAUZE DERMACEA FLUFF 4 (GAUZE/BANDAGES/DRESSINGS) ×2 IMPLANT
CHLORAPREP W/TINT 26 (MISCELLANEOUS) ×2 IMPLANT
CLIP TI MEDIUM 6 (CLIP) IMPLANT
CORD BIPOLAR FORCEPS 12FT (ELECTRODE) ×2 IMPLANT
COVER BACK TABLE 60X90IN (DRAPES) ×2 IMPLANT
COVER MAYO STAND STRL (DRAPES) ×2 IMPLANT
CUFF TOURN SGL QUICK 18X3 (MISCELLANEOUS) ×2 IMPLANT
CUFF TOURN SGL QUICK 18X4 (TOURNIQUET CUFF) ×2 IMPLANT
DRAPE EXTREMITY T 121X128X90 (DISPOSABLE) ×2 IMPLANT
DRAPE SURG 17X23 STRL (DRAPES) ×2 IMPLANT
GAUZE 4X4 16PLY ~~LOC~~+RFID DBL (SPONGE) IMPLANT
GAUZE PAD ABD 8X10 STRL (GAUZE/BANDAGES/DRESSINGS) ×2 IMPLANT
GAUZE SPONGE 4X4 12PLY STRL (GAUZE/BANDAGES/DRESSINGS) ×2 IMPLANT
GAUZE XEROFORM 1X8 LF (GAUZE/BANDAGES/DRESSINGS) ×2 IMPLANT
GLOVE BIO SURGEON STRL SZ7.5 (GLOVE) ×2 IMPLANT
GLOVE BIOGEL PI IND STRL 8 (GLOVE) ×2 IMPLANT
GLOVE BIOGEL PI IND STRL 8.5 (GLOVE) ×2 IMPLANT
GLOVE SURG ORTHO 8.0 STRL STRW (GLOVE) ×2 IMPLANT
GOWN STRL REUS W/ TWL LRG LVL3 (GOWN DISPOSABLE) ×2 IMPLANT
GOWN STRL REUS W/TWL XL LVL3 (GOWN DISPOSABLE) ×4 IMPLANT
NDL HYPO 25X1 1.5 SAFETY (NEEDLE) ×2 IMPLANT
NEEDLE HYPO 25X1 1.5 SAFETY (NEEDLE) IMPLANT
NS IRRIG 1000ML POUR BTL (IV SOLUTION) ×2 IMPLANT
PACK BASIN DAY SURGERY FS (CUSTOM PROCEDURE TRAY) ×2 IMPLANT
PAD CAST 3X4 CTTN HI CHSV (CAST SUPPLIES) ×2 IMPLANT
PAD CAST 4YDX4 CTTN HI CHSV (CAST SUPPLIES) ×2 IMPLANT
PADDING CAST ABS COTTON 4X4 ST (CAST SUPPLIES) ×2 IMPLANT
SLEEVE SCD COMPRESS KNEE MED (STOCKING) ×2 IMPLANT
SLING ARM FOAM STRAP LRG (SOFTGOODS) IMPLANT
SPIKE FLUID TRANSFER (MISCELLANEOUS) IMPLANT
SPLINT PLASTER CAST FAST 5X30 (CAST SUPPLIES) IMPLANT
SPLINT PLASTER CAST XFAST 3X15 (CAST SUPPLIES) IMPLANT
STOCKINETTE 4X48 STRL (DRAPES) ×2 IMPLANT
SUT ETHILON 4 0 PS 2 18 (SUTURE) ×2 IMPLANT
SUT VIC AB 2-0 SH 27XBRD (SUTURE) ×2 IMPLANT
SUT VIC AB 4-0 PS2 18 (SUTURE) IMPLANT
SYR BULB EAR ULCER 3OZ GRN STR (SYRINGE) ×2 IMPLANT
SYR CONTROL 10ML LL (SYRINGE) ×2 IMPLANT
TOWEL GREEN STERILE FF (TOWEL DISPOSABLE) ×4 IMPLANT
UNDERPAD 30X36 HEAVY ABSORB (UNDERPADS AND DIAPERS) ×2 IMPLANT

## 2023-10-02 NOTE — Anesthesia Preprocedure Evaluation (Signed)
 Anesthesia Evaluation  Patient identified by MRN, date of birth, ID band Patient awake    Reviewed: Allergy & Precautions, H&P , NPO status , Patient's Chart, lab work & pertinent test results  Airway Mallampati: III  TM Distance: >3 FB Neck ROM: Full    Dental  (+) Edentulous Upper, Edentulous Lower   Pulmonary COPD, former smoker   Pulmonary exam normal breath sounds clear to auscultation       Cardiovascular hypertension (NIBP 190s/80s in preop, pt hasnt taken losartan  in months because one of her doctors took her off of it per pt), + CAD, + Past MI, + Cardiac Stents (BMS, plavix  LD 5d ago) and + Peripheral Vascular Disease  Normal cardiovascular exam Rhythm:Regular Rate:Normal     Neuro/Psych negative neurological ROS  negative psych ROS   GI/Hepatic Neg liver ROS,GERD  Controlled and Medicated,,  Endo/Other  negative endocrine ROS    Renal/GU negative Renal ROS  negative genitourinary   Musculoskeletal  (+) Arthritis , Osteoarthritis,    Abdominal   Peds negative pediatric ROS (+)  Hematology negative hematology ROS (+)   Anesthesia Other Findings   Reproductive/Obstetrics negative OB ROS                             Anesthesia Physical Anesthesia Plan  ASA: 3  Anesthesia Plan: MAC and Regional   Post-op Pain Management: Tylenol  PO (pre-op)* and Regional block*   Induction: Intravenous  PONV Risk Score and Plan: 2 and Ondansetron , Dexamethasone  and Treatment may vary due to age or medical condition  Airway Management Planned: Natural Airway and Simple Face Mask  Additional Equipment: None  Intra-op Plan:   Post-operative Plan:   Informed Consent: I have reviewed the patients History and Physical, chart, labs and discussed the procedure including the risks, benefits and alternatives for the proposed anesthesia with the patient or authorized representative who has indicated  his/her understanding and acceptance.     Dental advisory given  Plan Discussed with: CRNA  Anesthesia Plan Comments:        Anesthesia Quick Evaluation

## 2023-10-02 NOTE — Anesthesia Postprocedure Evaluation (Signed)
 Anesthesia Post Note  Patient: [name]  Procedure(s) Performed: CARPAL TUNNEL RELEASE (Right: Wrist) ULNAR NERVE DECOMPRESSION (Right: Elbow)     Patient location during evaluation: PACU Anesthesia Type: Regional and MAC Level of consciousness: awake and alert, oriented and patient cooperative Pain management: pain level controlled Vital Signs Assessment: post-procedure vital signs reviewed and stable Respiratory status: spontaneous breathing, nonlabored ventilation and respiratory function stable Cardiovascular status: blood pressure returned to baseline and stable Postop Assessment: no apparent nausea or vomiting Anesthetic complications: no   No notable events documented.  Last Vitals:  Vitals:   10/02/23 1100 10/02/23 1121  BP: (!) 152/82 (!) 152/68  Pulse: 65 68  Resp: 18 16  Temp:  (!) 36.2 C  SpO2: 93% 98%    Last Pain:  Vitals:   10/02/23 1121  TempSrc: Temporal  PainSc: 0-No pain                 Jacquelyne Matte

## 2023-10-02 NOTE — Progress Notes (Signed)
Assisted Dr. Finucane with right, supraclavicular, ultrasound guided block. Side rails up, monitors on throughout procedure. See vital signs in flow sheet. Tolerated Procedure well. 

## 2023-10-02 NOTE — H&P (Signed)
 [name] is an 73 y.o. female.   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  Nocturnal symptoms.  Positive nerve conduction studies.  She wishes to proceed with right carpal tunnel release and ulnar nerve decompression with transposition as necessary at elbow.  Allergies: No Known Allergies  Past Medical History:  Diagnosis Date   ABDOMINAL AORTIC ANEURYSM    Anemia    Arthritis    HANDS   Blood transfusion without reported diagnosis    AUGUST 5TH 2017 x2    Breast cancer (HCC) 2022   right breast IDC-lump, SLN, Chemo/Rad   CAD    CONGESTIVE HEART FAILURE, SYSTOLIC, CHRONIC    COPD (chronic obstructive pulmonary disease) (HCC)    CT, CHEST, ABNORMAL    GERD (gastroesophageal reflux disease)    Heart attack (HCC)    x 2   History of colon polyps 01/05/2014   adenomatous polyps   HYPERLIPIDEMIA    HYPERTENSION    ISCHEMIC CARDIOMYOPATHY    Lower GI bleed 01/2014   NONSPECIFIC ABN FINDNG RAD&OTH EXAM BILARY TRCT    Numbness and tingling    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   Paresthesia of both hands    TOBACCO ABUSE     Past Surgical History:  Procedure Laterality Date   bare metal stent placement     left anterior descending artery x 2   COLONOSCOPY N/A 01/05/2014   Procedure: COLONOSCOPY;  Surgeon: Nannette Babe, MD;  Location: [facility];  Service: Endoscopy;  Laterality: N/A;   COLONOSCOPY N/A 01/06/2014   Procedure: COLONOSCOPY;  Surgeon: Nannette Babe, MD;  Location: [facility];  Service: Endoscopy;  Laterality: N/A;   COLONOSCOPY     POLYPECTOMY      Family History: Family History  Problem Relation Age of Onset   Lymphoma Mother 23   Colon polyps Father    Depression Father 32       died of suicide   Heart attack Father    Diabetes Maternal Grandmother    Anemia Sister    Colon polyps Sister    Lung cancer Sister    Colon cancer Neg Hx    Esophageal cancer Neg Hx     Rectal cancer Neg Hx    Stomach cancer Neg Hx     Social History:   reports that she quit smoking about 12 years ago. Her smoking use included cigarettes and e-cigarettes. She has never been exposed to tobacco smoke. She has never used smokeless tobacco. She reports that she does not currently use alcohol . She reports that she does not use drugs.  Medications: Medications Prior to Admission  Medication Sig Dispense Refill   acetaminophen  (TYLENOL ) 325 MG tablet Take 2 tablets (650 mg total) by mouth every 6 (six) hours as needed for mild pain (or Fever >/= 101).     aspirin  EC 81 MG tablet Take 81 mg by mouth daily. Swallow whole.     clopidogrel  (PLAVIX ) 75 MG tablet TAKE 1 TABLET BY MOUTH EVERY DAY 90 tablet 1   cyanocobalamin  (VITAMIN B12) 1000 MCG/ML injection Inject 1,000 mcg into the muscle every 30 (thirty) days.     docusate sodium (COLACE) 100 MG capsule Take 100 mg by mouth daily as needed for mild constipation.     DULoxetine (CYMBALTA) 30 MG capsule Take 30 mg by mouth daily.     ezetimibe (ZETIA) 10 MG tablet Take 10 mg  by mouth daily.     gabapentin  (NEURONTIN ) 300 MG capsule Take 1 capsule (300 mg total) by mouth 2 (two) times daily. (Patient taking differently: Take 600 mg by mouth 3 (three) times daily.) 180 capsule 1   letrozole  (FEMARA ) 2.5 MG tablet Take 2.5 mg by mouth daily.     pantoprazole  (PROTONIX ) 20 MG tablet Take 20 mg by mouth daily.     simvastatin  (ZOCOR ) 40 MG tablet TAKE 1 TABLET(40 MG) BY MOUTH DAILY AT 6 PM 90 tablet 1   Vitamin D , Cholecalciferol, 25 MCG (1000 UT) CAPS Take 2,000 Units by mouth daily.     losartan  (COZAAR ) 25 MG tablet Take 25 mg by mouth daily.     nitroGLYCERIN  (NITROSTAT ) 0.4 MG SL tablet Place 1 tablet (0.4 mg total) under the tongue every 5 (five) minutes as needed for chest pain. 25 tablet 3    No results found for this or any previous visit (from the past 48 hours).  No results found.    Blood pressure (!) 193/88, pulse 68,  temperature 98 F (36.7 C), temperature source Oral, height 5\' 3"  (1.6 m), weight 49.8 kg, SpO2 98%.  General appearance: alert, cooperative, and appears stated age Head: Normocephalic, without obvious abnormality, atraumatic Neck: supple, symmetrical, trachea midline Extremities: Intact capillary refill all digits.  +epl/fpl/io.  No wounds.  Skin: Skin color, texture, turgor normal. No rashes or lesions Neurologic: Grossly normal Incision/Wound: none  Assessment/Plan Right carpal tunnel syndrome and ulnar neuropathy at elbow.  Non operative and operative treatment options have been discussed with the patient and patient wishes to proceed with operative treatment. Risks, benefits, and alternatives of surgery have been discussed and the patient agrees with the plan of care.   [name] 10/02/2023, 8:16 AM

## 2023-10-02 NOTE — Op Note (Signed)
 10/02/2023 [facility]                              OPERATIVE REPORT   PREOPERATIVE DIAGNOSIS: 1.  Right carpal tunnel syndrome 2.  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  Right carpal tunnel syndrome 2.  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  Right carpal tunnel release 2.  Right ulnar nerve decompression at elbow  SURGEON:  Brunilda Capra, MD  ASSISTANT: Lyanne Sample, MD.  ANESTHESIA: Regional with sedation  IV FLUIDS:  Per anesthesia flow sheet  ESTIMATED BLOOD LOSS:  Minimal  COMPLICATIONS:  None  SPECIMENS:  None  TOURNIQUET TIME:    Total Tourniquet Time Documented: Upper Arm (Right) - 24 minutes Total: Upper Arm (Right) - 24 minutes   DISPOSITION:  Stable to PACU  LOCATION: [facility]  INDICATIONS:  73 y.o. yo female with numbness and tingling right hand.  Nocturnal symptoms. Positive nerve conduction studies for both carpal tunnel syndrome and ulnar neuropathy at elbow. She wishes to proceed with right carpal tunnel release and ulnar nerve decompression with possible transposition.  Risks, benefits and alternatives of surgery were discussed including the risk of blood loss; infection; damage to nerves, vessels, tendons, ligaments, bone; failure of surgery; need for additional surgery; complications with wound healing; continued pain; recurrence of carpal tunnel syndrome; and damage to motor branch. She voiced understanding of these risks and elected to proceed.   OPERATIVE COURSE:  After being identified preoperatively by myself, the patient and I agreed upon the procedure and site of procedure.  The surgical site was marked.  Surgical consent had been signed.  She was given IV Ancef  as preoperative antibiotic prophylaxis.  She was transferred to the operating room and placed on the operating room table in supine position with the right upper extremity on an armboard.  Sedation was induced by the  anesthesiologist. and A regional block had been performed by anesthesia in preoperative holding.    Right upper extremity was prepped and draped in normal sterile orthopaedic fashion.  A surgical pause was performed between the surgeons, anesthesia, and operating room staff, and all were in agreement as to the patient, procedure, and site of procedure.  Tourniquet at the proximal aspect of the extremity was inflated to 250 mmHg after exsanguination of the arm with an Esmarch bandage  Incision was made over the transverse carpal ligament and carried into the subcutaneous tissues by spreading technique.  Bipolar electrocautery was used to obtain hemostasis.  The palmar fascia was sharply incised.  The transverse carpal ligament was identified.  The fascia distal to the ligament was opened.  Retractor was placed and the flexor tendons were identified.  The flexor tendon to the ring finger was identified and retracted radially.  The transverse carpal ligament was then incised from distal to proximal under direct visualization.  Scissors were used to split the distal aspect of the volar antebrachial fascia.  A finger was placed into the wound to ensure complete decompression, which was the case.  The nerve was examined.  It was flattened and hyperemic.  The motor branch was identified and was intact.  The wound was copiously irrigated with sterile saline.  It was then closed with 4-0 nylon in a horizontal mattress fashion.  An incision was then made at the medial side of the elbow.  This was carried into subcutaneous tissues by spreading technique.  Bipolar electrocautery is used to obtain hemostasis.  The ulnar nerve was identified proximal to Osborne's ligament.  Osborne's ligament was then divided from proximal to distal while protecting the ulnar nerve.  The fascia over the FCU muscle was released and the FCU muscle belly spread.  The investing fascia over the nerve was released.  Motor branches to the FCU muscle  were protected.  The nerve was then decompressed proximally.  Muscular fascia was released and then investing fascia surrounding nerve was released.  There was good decompression of the nerve.  The elbow was flexed.  The nerve did not subluxate out of the groove.  The wound was copiously irrigated with sterile saline.  The anterior leaflet of Osborne's ligament was repaired to the posterior subcutaneous tissues with a 2-0 Vicryl suture in a figure-of-eight fashion.  No compression was created.  Inverted interrupted 4-0 Vicryl sutures were placed in subcutaneous tissues and the skin was closed with 4-0 nylon in a horizontal mattress fashion.  The wounds were dressed with sterile Xeroform, 4x4s, an ABD, and wrapped with Kerlix and an Ace bandage.  Tourniquet was deflated at 24 minutes.  Fingertips were pink with brisk capillary refill after deflation of the tourniquet.  Operative drapes were broken down.  The patient was awoken from anesthesia safely.  She was transferred back to stretcher and taken to the PACU in stable condition.  I will see her back in the office in 1 week for postoperative followup.  I will give her a prescription for Norco 5/325 1 tab PO q6 hours prn pain, dispense # 15.    Caydon Feasel, MD Electronically signed, 10/02/23

## 2023-10-02 NOTE — Op Note (Signed)
 I assisted Surgeons and Role:    * Brunilda Capra, MD - Primary    Lyanne Sample, MD - Assisting on the Procedure(s): CARPAL TUNNEL RELEASE ULNAR NERVE DECOMPRESSION on 10/02/2023.  I provided assistance on this case as follows: Set up, approach, identification release of the median nerve at the wrist, closure of the wound. Approach to the ulnar nerve at the elbow, retraction for identification of the nerve and Osborne's fascia, of Osborne's fascia, retraction for release of the ulnar nerve distally through the flexor carpi ulnaris, retraction for release of the ulnar nerve proximally, check for subluxation, creation of barrier for blockage of subluxation, closure of the wound and application of the dressing.  Electronically signed by: Lyanne Sample, MD Date: 10/02/2023 Time: 10:46 AM

## 2023-10-02 NOTE — Anesthesia Procedure Notes (Signed)
 Anesthesia Regional Block: Supraclavicular block   Pre-Anesthetic Checklist: , timeout performed,  Correct Patient, Correct Site, Correct Laterality,  Correct Procedure, Correct Position, site marked,  Risks and benefits discussed,  Surgical consent,  Pre-op evaluation,  At surgeon's request and post-op pain management  Laterality: Right  Prep: Maximum Sterile Barrier Precautions used, chloraprep       Needles:  Injection technique: Single-shot  Needle Type: Echogenic Stimulator Needle     Needle Length: 9cm  Needle Gauge: 22     Additional Needles:   Procedures:,,,, ultrasound used (permanent image in chart),,    Narrative:  Start time: 10/02/2023 8:45 AM End time: 10/02/2023 8:50 AM Injection made incrementally with aspirations every 5 mL.  Performed by: Personally  Anesthesiologist: Jacquelyne Matte, DO  Additional Notes: Monitors applied. No increased pain on injection. No increased resistance to injection. Injection made in 5cc increments. Good needle visualization. Patient tolerated procedure well.

## 2023-10-02 NOTE — Transfer of Care (Signed)
 Immediate Anesthesia Transfer of Care Note  Patient: [name]  Procedure(s) Performed: CARPAL TUNNEL RELEASE (Right: Wrist) ULNAR NERVE DECOMPRESSION (Right: Elbow)  Patient Location: PACU  Anesthesia Type:MAC and Regional  Level of Consciousness: awake, alert , oriented, and patient cooperative  Airway & Oxygen Therapy: Patient Spontanous Breathing  Post-op Assessment: Report given to RN and Post -op Vital signs reviewed and stable  Post vital signs: Reviewed and stable  Last Vitals:  Vitals Value Taken Time  BP    Temp    Pulse    Resp    SpO2      Last Pain:  Vitals:   10/02/23 0748  TempSrc: Oral  PainSc: 4       Patients Stated Pain Goal: 7 (10/02/23 0748)  Complications: No notable events documented.

## 2023-10-02 NOTE — Discharge Instructions (Signed)

## 2023-10-03 ENCOUNTER — Encounter (HOSPITAL_BASED_OUTPATIENT_CLINIC_OR_DEPARTMENT_OTHER): Payer: Self-pay | Admitting: Orthopedic Surgery

## 2023-10-07 ENCOUNTER — Other Ambulatory Visit: Payer: Self-pay | Admitting: Cardiovascular Disease

## 2023-10-10 DIAGNOSIS — D510 Vitamin B12 deficiency anemia due to intrinsic factor deficiency: Secondary | ICD-10-CM | POA: Diagnosis not present

## 2023-10-10 DIAGNOSIS — E871 Hypo-osmolality and hyponatremia: Secondary | ICD-10-CM | POA: Diagnosis not present

## 2023-10-10 DIAGNOSIS — G609 Hereditary and idiopathic neuropathy, unspecified: Secondary | ICD-10-CM | POA: Diagnosis not present

## 2023-10-10 DIAGNOSIS — I2510 Atherosclerotic heart disease of native coronary artery without angina pectoris: Secondary | ICD-10-CM | POA: Diagnosis not present

## 2023-10-10 DIAGNOSIS — I714 Abdominal aortic aneurysm, without rupture, unspecified: Secondary | ICD-10-CM | POA: Diagnosis not present

## 2023-10-10 DIAGNOSIS — Z853 Personal history of malignant neoplasm of breast: Secondary | ICD-10-CM | POA: Diagnosis not present

## 2023-10-10 DIAGNOSIS — I6523 Occlusion and stenosis of bilateral carotid arteries: Secondary | ICD-10-CM | POA: Diagnosis not present

## 2023-10-10 DIAGNOSIS — I739 Peripheral vascular disease, unspecified: Secondary | ICD-10-CM | POA: Diagnosis not present

## 2023-10-10 DIAGNOSIS — I10 Essential (primary) hypertension: Secondary | ICD-10-CM | POA: Diagnosis not present

## 2023-10-10 DIAGNOSIS — E785 Hyperlipidemia, unspecified: Secondary | ICD-10-CM | POA: Diagnosis not present

## 2023-10-10 DIAGNOSIS — G119 Hereditary ataxia, unspecified: Secondary | ICD-10-CM | POA: Diagnosis not present

## 2023-10-10 DIAGNOSIS — J432 Centrilobular emphysema: Secondary | ICD-10-CM | POA: Diagnosis not present

## 2023-10-28 DIAGNOSIS — R202 Paresthesia of skin: Secondary | ICD-10-CM | POA: Diagnosis not present

## 2023-10-28 DIAGNOSIS — G3281 Cerebellar ataxia in diseases classified elsewhere: Secondary | ICD-10-CM | POA: Diagnosis not present

## 2023-10-28 DIAGNOSIS — R531 Weakness: Secondary | ICD-10-CM | POA: Diagnosis not present

## 2023-10-28 DIAGNOSIS — R200 Anesthesia of skin: Secondary | ICD-10-CM | POA: Diagnosis not present

## 2023-10-28 DIAGNOSIS — R269 Unspecified abnormalities of gait and mobility: Secondary | ICD-10-CM | POA: Diagnosis not present

## 2023-11-06 ENCOUNTER — Ambulatory Visit (HOSPITAL_COMMUNITY)
Admission: RE | Admit: 2023-11-06 | Discharge: 2023-11-06 | Disposition: A | Discharge status: Home / Self Care | Source: Ambulatory Visit | Attending: Cardiovascular Disease | Admitting: Cardiovascular Disease

## 2023-11-06 DIAGNOSIS — I771 Stricture of artery: Secondary | ICD-10-CM

## 2023-11-06 DIAGNOSIS — I739 Peripheral vascular disease, unspecified: Secondary | ICD-10-CM | POA: Diagnosis not present

## 2023-11-06 DIAGNOSIS — I714 Abdominal aortic aneurysm, without rupture, unspecified: Secondary | ICD-10-CM

## 2023-11-08 LAB — VAS US ABI WITH/WO TBI
Left ABI: 1.11
Right ABI: 1.01

## 2023-11-09 ENCOUNTER — Ambulatory Visit: Payer: Self-pay | Admitting: Cardiovascular Disease

## 2023-11-09 DIAGNOSIS — I771 Stricture of artery: Secondary | ICD-10-CM

## 2023-11-09 DIAGNOSIS — I714 Abdominal aortic aneurysm, without rupture, unspecified: Secondary | ICD-10-CM

## 2023-11-09 DIAGNOSIS — I6523 Occlusion and stenosis of bilateral carotid arteries: Secondary | ICD-10-CM

## 2023-11-09 DIAGNOSIS — I739 Peripheral vascular disease, unspecified: Secondary | ICD-10-CM

## 2023-11-26 DIAGNOSIS — R531 Weakness: Secondary | ICD-10-CM | POA: Diagnosis not present

## 2023-11-26 DIAGNOSIS — R269 Unspecified abnormalities of gait and mobility: Secondary | ICD-10-CM | POA: Diagnosis not present

## 2023-11-26 DIAGNOSIS — G3281 Cerebellar ataxia in diseases classified elsewhere: Secondary | ICD-10-CM | POA: Diagnosis not present

## 2023-11-26 DIAGNOSIS — R202 Paresthesia of skin: Secondary | ICD-10-CM | POA: Diagnosis not present

## 2023-11-26 DIAGNOSIS — R200 Anesthesia of skin: Secondary | ICD-10-CM | POA: Diagnosis not present

## 2023-12-16 DIAGNOSIS — Z170 Estrogen receptor positive status [ER+]: Secondary | ICD-10-CM | POA: Diagnosis not present

## 2023-12-16 DIAGNOSIS — Z08 Encounter for follow-up examination after completed treatment for malignant neoplasm: Secondary | ICD-10-CM | POA: Diagnosis not present

## 2023-12-16 DIAGNOSIS — Z79811 Long term (current) use of aromatase inhibitors: Secondary | ICD-10-CM | POA: Diagnosis not present

## 2023-12-16 DIAGNOSIS — Z1731 Human epidermal growth factor receptor 2 positive status: Secondary | ICD-10-CM | POA: Diagnosis not present

## 2023-12-16 DIAGNOSIS — Z853 Personal history of malignant neoplasm of breast: Secondary | ICD-10-CM | POA: Diagnosis not present

## 2023-12-16 DIAGNOSIS — M85859 Other specified disorders of bone density and structure, unspecified thigh: Secondary | ICD-10-CM | POA: Diagnosis not present

## 2023-12-16 DIAGNOSIS — C773 Secondary and unspecified malignant neoplasm of axilla and upper limb lymph nodes: Secondary | ICD-10-CM | POA: Diagnosis not present

## 2023-12-16 DIAGNOSIS — C50912 Malignant neoplasm of unspecified site of left female breast: Secondary | ICD-10-CM | POA: Diagnosis not present

## 2023-12-16 DIAGNOSIS — Z1721 Progesterone receptor positive status: Secondary | ICD-10-CM | POA: Diagnosis not present

## 2023-12-16 DIAGNOSIS — C50412 Malignant neoplasm of upper-outer quadrant of left female breast: Secondary | ICD-10-CM | POA: Diagnosis not present

## 2023-12-24 DIAGNOSIS — R200 Anesthesia of skin: Secondary | ICD-10-CM | POA: Diagnosis not present

## 2023-12-24 DIAGNOSIS — R269 Unspecified abnormalities of gait and mobility: Secondary | ICD-10-CM | POA: Diagnosis not present

## 2023-12-24 DIAGNOSIS — R202 Paresthesia of skin: Secondary | ICD-10-CM | POA: Diagnosis not present

## 2023-12-24 DIAGNOSIS — R531 Weakness: Secondary | ICD-10-CM | POA: Diagnosis not present

## 2023-12-24 DIAGNOSIS — G3281 Cerebellar ataxia in diseases classified elsewhere: Secondary | ICD-10-CM | POA: Diagnosis not present

## 2024-01-16 DIAGNOSIS — Z780 Asymptomatic menopausal state: Secondary | ICD-10-CM | POA: Diagnosis not present

## 2024-01-21 DIAGNOSIS — R202 Paresthesia of skin: Secondary | ICD-10-CM | POA: Diagnosis not present

## 2024-01-21 DIAGNOSIS — R269 Unspecified abnormalities of gait and mobility: Secondary | ICD-10-CM | POA: Diagnosis not present

## 2024-01-21 DIAGNOSIS — R531 Weakness: Secondary | ICD-10-CM | POA: Diagnosis not present

## 2024-01-21 DIAGNOSIS — G3281 Cerebellar ataxia in diseases classified elsewhere: Secondary | ICD-10-CM | POA: Diagnosis not present

## 2024-01-21 DIAGNOSIS — R200 Anesthesia of skin: Secondary | ICD-10-CM | POA: Diagnosis not present

## 2024-01-29 DIAGNOSIS — G549 Nerve root and plexus disorder, unspecified: Secondary | ICD-10-CM | POA: Diagnosis not present

## 2024-01-29 DIAGNOSIS — R202 Paresthesia of skin: Secondary | ICD-10-CM | POA: Diagnosis not present

## 2024-01-29 DIAGNOSIS — M79642 Pain in left hand: Secondary | ICD-10-CM | POA: Diagnosis not present

## 2024-01-29 DIAGNOSIS — M79641 Pain in right hand: Secondary | ICD-10-CM | POA: Diagnosis not present

## 2024-01-29 DIAGNOSIS — R200 Anesthesia of skin: Secondary | ICD-10-CM | POA: Diagnosis not present

## 2024-02-03 DIAGNOSIS — M79642 Pain in left hand: Secondary | ICD-10-CM | POA: Diagnosis not present

## 2024-02-03 DIAGNOSIS — M79641 Pain in right hand: Secondary | ICD-10-CM | POA: Diagnosis not present

## 2024-02-03 DIAGNOSIS — R202 Paresthesia of skin: Secondary | ICD-10-CM | POA: Diagnosis not present

## 2024-02-03 DIAGNOSIS — R200 Anesthesia of skin: Secondary | ICD-10-CM | POA: Diagnosis not present

## 2024-02-03 DIAGNOSIS — G549 Nerve root and plexus disorder, unspecified: Secondary | ICD-10-CM | POA: Diagnosis not present

## 2024-02-10 DIAGNOSIS — R200 Anesthesia of skin: Secondary | ICD-10-CM | POA: Diagnosis not present

## 2024-02-10 DIAGNOSIS — M79642 Pain in left hand: Secondary | ICD-10-CM | POA: Diagnosis not present

## 2024-02-10 DIAGNOSIS — R202 Paresthesia of skin: Secondary | ICD-10-CM | POA: Diagnosis not present

## 2024-02-10 DIAGNOSIS — M79641 Pain in right hand: Secondary | ICD-10-CM | POA: Diagnosis not present

## 2024-02-10 DIAGNOSIS — G549 Nerve root and plexus disorder, unspecified: Secondary | ICD-10-CM | POA: Diagnosis not present

## 2024-02-24 DIAGNOSIS — R202 Paresthesia of skin: Secondary | ICD-10-CM | POA: Diagnosis not present

## 2024-02-24 DIAGNOSIS — R269 Unspecified abnormalities of gait and mobility: Secondary | ICD-10-CM | POA: Diagnosis not present

## 2024-02-24 DIAGNOSIS — R200 Anesthesia of skin: Secondary | ICD-10-CM | POA: Diagnosis not present

## 2024-02-24 DIAGNOSIS — R531 Weakness: Secondary | ICD-10-CM | POA: Diagnosis not present

## 2024-02-24 DIAGNOSIS — G3281 Cerebellar ataxia in diseases classified elsewhere: Secondary | ICD-10-CM | POA: Diagnosis not present

## 2024-03-18 ENCOUNTER — Encounter (HOSPITAL_COMMUNITY)

## 2024-03-22 DIAGNOSIS — G3281 Cerebellar ataxia in diseases classified elsewhere: Secondary | ICD-10-CM | POA: Diagnosis not present

## 2024-03-22 DIAGNOSIS — R202 Paresthesia of skin: Secondary | ICD-10-CM | POA: Diagnosis not present

## 2024-03-22 DIAGNOSIS — R531 Weakness: Secondary | ICD-10-CM | POA: Diagnosis not present

## 2024-03-22 DIAGNOSIS — R269 Unspecified abnormalities of gait and mobility: Secondary | ICD-10-CM | POA: Diagnosis not present

## 2024-03-22 DIAGNOSIS — R200 Anesthesia of skin: Secondary | ICD-10-CM | POA: Diagnosis not present

## 2024-04-15 DIAGNOSIS — Z1212 Encounter for screening for malignant neoplasm of rectum: Secondary | ICD-10-CM | POA: Diagnosis not present

## 2024-04-15 DIAGNOSIS — D510 Vitamin B12 deficiency anemia due to intrinsic factor deficiency: Secondary | ICD-10-CM | POA: Diagnosis not present

## 2024-04-20 DIAGNOSIS — R269 Unspecified abnormalities of gait and mobility: Secondary | ICD-10-CM | POA: Diagnosis not present

## 2024-04-20 DIAGNOSIS — R531 Weakness: Secondary | ICD-10-CM | POA: Diagnosis not present

## 2024-04-20 DIAGNOSIS — R202 Paresthesia of skin: Secondary | ICD-10-CM | POA: Diagnosis not present

## 2024-04-20 DIAGNOSIS — G3281 Cerebellar ataxia in diseases classified elsewhere: Secondary | ICD-10-CM | POA: Diagnosis not present

## 2024-04-20 DIAGNOSIS — R200 Anesthesia of skin: Secondary | ICD-10-CM | POA: Diagnosis not present

## 2024-04-22 DIAGNOSIS — R82998 Other abnormal findings in urine: Secondary | ICD-10-CM | POA: Diagnosis not present

## 2024-04-22 DIAGNOSIS — Z23 Encounter for immunization: Secondary | ICD-10-CM | POA: Diagnosis not present

## 2024-11-01 ENCOUNTER — Ambulatory Visit: Admitting: Cardiovascular Disease
